# Patient Record
Sex: Female | Born: 1955 | Race: White | Hispanic: No | Smoking: Never smoker
Health system: Southern US, Community
[De-identification: ages and names within clinical notes are randomized; demographics above are authoritative.]

## PROBLEM LIST (undated history)

## (undated) DIAGNOSIS — E119 Type 2 diabetes mellitus without complications: Secondary | ICD-10-CM

## (undated) DIAGNOSIS — K219 Gastro-esophageal reflux disease without esophagitis: Secondary | ICD-10-CM

## (undated) DIAGNOSIS — K200 Eosinophilic esophagitis: Secondary | ICD-10-CM

## (undated) DIAGNOSIS — K222 Esophageal obstruction: Secondary | ICD-10-CM

## (undated) DIAGNOSIS — D869 Sarcoidosis, unspecified: Secondary | ICD-10-CM

## (undated) DIAGNOSIS — J45990 Exercise induced bronchospasm: Secondary | ICD-10-CM

## (undated) DIAGNOSIS — R058 Other specified cough: Secondary | ICD-10-CM

## (undated) DIAGNOSIS — E039 Hypothyroidism, unspecified: Secondary | ICD-10-CM

## (undated) DIAGNOSIS — I10 Essential (primary) hypertension: Secondary | ICD-10-CM

## (undated) DIAGNOSIS — R05 Cough: Secondary | ICD-10-CM

## (undated) DIAGNOSIS — C4491 Basal cell carcinoma of skin, unspecified: Secondary | ICD-10-CM

## (undated) DIAGNOSIS — R0602 Shortness of breath: Secondary | ICD-10-CM

## (undated) DIAGNOSIS — L814 Other melanin hyperpigmentation: Secondary | ICD-10-CM

## (undated) DIAGNOSIS — R079 Chest pain, unspecified: Secondary | ICD-10-CM

## (undated) DIAGNOSIS — E079 Disorder of thyroid, unspecified: Secondary | ICD-10-CM

## (undated) HISTORY — DX: Other melanin hyperpigmentation: L81.4

## (undated) HISTORY — DX: Sarcoidosis, unspecified: D86.9

## (undated) HISTORY — DX: Eosinophilic esophagitis: K20.0

## (undated) HISTORY — PX: ABDOMINAL HYSTERECTOMY: SHX81

## (undated) HISTORY — DX: Essential (primary) hypertension: I10

## (undated) HISTORY — DX: Basal cell carcinoma of skin, unspecified: C44.91

---

## 1997-08-08 ENCOUNTER — Other Ambulatory Visit: Admission: RE | Admit: 1997-08-08 | Discharge: 1997-08-08 | Payer: Self-pay | Admitting: *Deleted

## 1998-06-25 ENCOUNTER — Other Ambulatory Visit: Admission: RE | Admit: 1998-06-25 | Discharge: 1998-06-25 | Payer: Self-pay | Admitting: *Deleted

## 1999-08-26 ENCOUNTER — Other Ambulatory Visit: Admission: RE | Admit: 1999-08-26 | Discharge: 1999-08-26 | Payer: Self-pay | Admitting: Gynecology

## 1999-09-18 ENCOUNTER — Encounter (INDEPENDENT_AMBULATORY_CARE_PROVIDER_SITE_OTHER): Payer: Self-pay

## 1999-09-18 ENCOUNTER — Ambulatory Visit (HOSPITAL_COMMUNITY): Admission: RE | Admit: 1999-09-18 | Discharge: 1999-09-18 | Payer: Self-pay | Admitting: Gynecology

## 2000-06-21 ENCOUNTER — Other Ambulatory Visit: Admission: RE | Admit: 2000-06-21 | Discharge: 2000-06-21 | Payer: Self-pay | Admitting: *Deleted

## 2000-11-11 ENCOUNTER — Ambulatory Visit (HOSPITAL_COMMUNITY): Admission: RE | Admit: 2000-11-11 | Discharge: 2000-11-11 | Payer: Self-pay | Admitting: Family Medicine

## 2000-11-11 ENCOUNTER — Encounter: Payer: Self-pay | Admitting: Family Medicine

## 2001-05-29 ENCOUNTER — Other Ambulatory Visit: Admission: RE | Admit: 2001-05-29 | Discharge: 2001-05-29 | Payer: Self-pay | Admitting: Obstetrics and Gynecology

## 2001-07-12 ENCOUNTER — Encounter (INDEPENDENT_AMBULATORY_CARE_PROVIDER_SITE_OTHER): Payer: Self-pay

## 2001-07-13 ENCOUNTER — Inpatient Hospital Stay (HOSPITAL_COMMUNITY): Admission: RE | Admit: 2001-07-13 | Discharge: 2001-07-14 | Payer: Self-pay | Admitting: Obstetrics and Gynecology

## 2002-10-18 ENCOUNTER — Emergency Department (HOSPITAL_COMMUNITY): Admission: EM | Admit: 2002-10-18 | Discharge: 2002-10-18 | Payer: Self-pay | Admitting: Emergency Medicine

## 2002-10-18 ENCOUNTER — Encounter: Payer: Self-pay | Admitting: Emergency Medicine

## 2005-06-15 ENCOUNTER — Emergency Department (HOSPITAL_COMMUNITY): Admission: EM | Admit: 2005-06-15 | Discharge: 2005-06-16 | Payer: Self-pay | Admitting: *Deleted

## 2005-07-08 ENCOUNTER — Other Ambulatory Visit: Admission: RE | Admit: 2005-07-08 | Discharge: 2005-07-08 | Payer: Self-pay | Admitting: Obstetrics and Gynecology

## 2006-10-12 ENCOUNTER — Emergency Department: Payer: Self-pay | Admitting: Emergency Medicine

## 2007-08-31 ENCOUNTER — Emergency Department (HOSPITAL_COMMUNITY): Admission: EM | Admit: 2007-08-31 | Discharge: 2007-08-31 | Payer: Self-pay | Admitting: Emergency Medicine

## 2007-09-29 ENCOUNTER — Ambulatory Visit (HOSPITAL_COMMUNITY): Admission: RE | Admit: 2007-09-29 | Discharge: 2007-09-29 | Payer: Self-pay | Admitting: Otolaryngology

## 2007-12-25 ENCOUNTER — Emergency Department (HOSPITAL_COMMUNITY): Admission: EM | Admit: 2007-12-25 | Discharge: 2007-12-25 | Payer: Self-pay | Admitting: Emergency Medicine

## 2007-12-28 ENCOUNTER — Ambulatory Visit: Payer: Self-pay | Admitting: Internal Medicine

## 2007-12-28 DIAGNOSIS — R1013 Epigastric pain: Secondary | ICD-10-CM

## 2007-12-28 DIAGNOSIS — K219 Gastro-esophageal reflux disease without esophagitis: Secondary | ICD-10-CM

## 2007-12-28 DIAGNOSIS — E039 Hypothyroidism, unspecified: Secondary | ICD-10-CM

## 2007-12-28 DIAGNOSIS — R519 Headache, unspecified: Secondary | ICD-10-CM | POA: Insufficient documentation

## 2007-12-28 DIAGNOSIS — R51 Headache: Secondary | ICD-10-CM

## 2007-12-28 DIAGNOSIS — K279 Peptic ulcer, site unspecified, unspecified as acute or chronic, without hemorrhage or perforation: Secondary | ICD-10-CM | POA: Insufficient documentation

## 2008-02-21 ENCOUNTER — Ambulatory Visit: Payer: Self-pay | Admitting: Internal Medicine

## 2008-02-21 DIAGNOSIS — M5126 Other intervertebral disc displacement, lumbar region: Secondary | ICD-10-CM | POA: Insufficient documentation

## 2008-02-21 DIAGNOSIS — M545 Low back pain: Secondary | ICD-10-CM | POA: Insufficient documentation

## 2008-02-29 ENCOUNTER — Telehealth: Payer: Self-pay | Admitting: Internal Medicine

## 2008-03-11 DIAGNOSIS — L814 Other melanin hyperpigmentation: Secondary | ICD-10-CM

## 2008-03-11 DIAGNOSIS — C4491 Basal cell carcinoma of skin, unspecified: Secondary | ICD-10-CM

## 2008-03-11 HISTORY — DX: Other melanin hyperpigmentation: L81.4

## 2008-03-11 HISTORY — DX: Basal cell carcinoma of skin, unspecified: C44.91

## 2008-03-18 ENCOUNTER — Ambulatory Visit: Payer: Self-pay | Admitting: Gastroenterology
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
## 2010-09-04 NOTE — Op Note (Signed)
[facility]  Patient:    Katrina [name], Katrina [name] Visit Number: [id number] MRN: [id number]          Service Type: Attending:  Duke Salvia. Marcelle Overlie, M.D. Dictated by:   Duke Salvia. Marcelle Overlie, M.D. Proc. Date: 07/12/01                             Operative Report  PREOPERATIVE DIAGNOSES:       Symptomatic leiomyoma, menorrhagia.  POSTOPERATIVE DIAGNOSES:      Symptomatic leiomyoma, menorrhagia.  PROCEDURE:                    Laparoscopically assisted vaginal hysterectomy.  SURGEON:                      Duke Salvia. Marcelle Overlie, M.D.  ASSISTANTWilley Blade, M.D.  ANESTHESIA:                   General endotracheal.  COMPLICATIONS:                None.  DRAINS:                       Foley catheter.  ESTIMATED BLOOD LOSS:         300.  PROCEDURE AND FINDINGS:       Patient taken to the operating room.  After an adequate level of general endotracheal anesthesia was obtained with the patients legs in stirrups, the abdomen, perineum, and vagina were prepped and draped in the usual manner for laparoscopy.  The bladder was drained.  EUA carried out.  Uterus felt to be 10 weeks size.  Adnexa nonpalpable.  Kahn cannula was positioned.  Attention directed to the abdomen where a 2 cm subumbilical incision was made.  The Veress needle was introduced without difficulty.  Its intra-abdominal position was verified by pressure and water testing.  After a 2 L pneumoperitoneum was then created laparoscopic trocar and sleeve were then introduced without difficulty.  There was no evidence of any bleeding or trauma.  Three fingerbreadths below the symphysis in the midline a second puncture was made and a 5 mm trocar was inserted.  Patient in Trendelenburg.  Pelvic findings as follows:  Uterus itself was symmetrically enlarged 10 weeks size.  Both tubes and ovaries appeared to be normal.  She had requested that these be preserved if normal.  I could not get a good  view of the utero-ovarian pedicles due to the size of the uterus, so decision made to proceed with the vaginal portion of the procedure.  The legs were extended. Weighted speculum was positioned.  Cervix grasped with a tenaculum.  The cervical vaginal mucosa was incised.  Posterior culdotomy performed without difficulty.  The left uterosacral ligament was clamped, divided, and suture ligated with 0 Dexon in Heaney fashion and held.  Same repeated on the opposite side.  The bladder was advanced superiorly with sharp and blunt dissection.  In a sequential manner the cardinal ligament and uterine vasculature pedicles were clamped, divided, and suture ligated with 0 Dexon suture.  There was difficulty in establishing bladder flap plane due to an anterior fibroid.  Decision made to proceed with morcellization.  This was carried out removing fragments of the middle portion of the uterus along  with some fibroids until the left utero-ovarian pedicle could be identified.  This was clamped, divided, first free tied followed by a suture ligature of 0 Dexon.  After this was completed the surgeons finger was used to explore the back part of the uterus until the anterior peritoneum could be identified. This was entered and the remainder of the pedicles were clamped, divided, and suture ligated with 0 Dexon, thus conserving both ovaries.  The posterior cuff was closed with a running locked 2-0 Dexon suture.  Reinspection of all major pedicles at that point revealed them to be hemostatic.  Prior to closure sponge, needle, and instrument counts were reported as correct x2.  The cuff was closed from right to left with figure-of-eight 2-0 Monocryl sutures.  The patient was then relaparoscoped.  The abdomen was reinsufflated.  Irrigation was carried out.  Careful inspection of all operative sites revealed them to be hemostatic.  Instruments were removed.  Gas allowed to escape.  Defects closed with 4-0 Dexon  subcuticular sutures along with Dermabond.  She tolerated this well.  Went to recovery room in good condition. Dictated by:   Duke Salvia. Marcelle Overlie, M.D. Attending:  Duke Salvia. Marcelle Overlie, M.D. DD:  07/12/01 TD:  07/12/01 Job: [id number]

## 2010-09-04 NOTE — H&P (Signed)
[facility]  Patient:    Katrina [name], Katrina [name] Visit Number: [id number] MRN: [id number]          Service Type: Attending:  Duke Salvia. Marcelle Overlie, M.D. Dictated by:   Duke Salvia. Marcelle Overlie, M.D. Adm. Date:  07/12/01                           History and Physical  HISTORY OF PRESENT ILLNESS:   A 55 year old G4 P3; her partner has had a vasectomy.  She has a six to nine-month history of menorrhagia.  She has had an evaluation by her PCP including an ultrasound dated July 2002 that showed several fibroids in the 3-4 cm range with normal adnexa.  She has declined a trial of OCPs due to the heaviness of her flow and prefers to have definitive treatment in the form of hysterectomy.  She does desire to have her ovaries conserved.  This procedure, including risks of bleeding, transfusion, adjacent organ injury, the possible need for open or additional surgery discussed. Endometrial biopsy performed in our office February 2003 was proliferative endometrium with extensive breakdown.  Pap smear February 2003 was Class I.  PAST MEDICAL HISTORY:  ALLERGIES:                    PENICILLIN.  OPERATIONS:                   D&C two years ago.  MEDICATIONS:                  1. Synthroid 0.125 q.d.                               2. Wellbutrin.  REVIEW OF SYSTEMS:            Significant for a history of anemia, recurrent UTIs, seasonal asthma.  She does not take any regular medicines.  PHYSICAL EXAMINATION:  VITAL SIGNS:                  Temperature 98.2, blood pressure 108/60  HEENT:                        Unremarkable.  NECK:                         Supple without masses.  LUNGS:                        Clear.  CARDIOVASCULAR:               Regular rate and rhythm without murmurs, rubs, or gallops noted.  BREASTS:                      Without masses.  ABDOMEN:                      Soft, flat, and nontender.  PELVIC:                       Normal external genitalia,  vagina and cervix clear.  Uterus 8-10 week size, mobile.  Adnexa negative.  IMPRESSION:                   Symptomatic leiomyoma.  PLAN:  LAVH.  Patient desires to have her ovaries conserved if normal.  Procedure and risks reviewed as above. Dictated by:   Duke Salvia. Marcelle Overlie, M.D. Attending:  Duke Salvia. Marcelle Overlie, M.D. DD:  07/05/01 TD:  07/05/01 Job: [id number]

## 2010-09-04 NOTE — Op Note (Signed)
[facility]  Patient:    Katrina [name], Katrina [name]                        MRN: [id number] Proc. Date: 09/18/99 Adm. Date:  [id number] Disc. Date: [id number] Attending:  Douglass Rivers                           Operative Report  PREOPERATIVE DIAGNOSIS:       Dysfunctional uterine bleeding, fibroid uterus.  POSTOPERATIVE DIAGNOSIS:      Dysfunctional uterine bleeding, fibroid uterus.  OPERATION:                    Suction D&C.  SURGEON:                      Douglass Rivers, M.D.  ASSISTANT:  ANESTHESIA:                   IV sedation with paracervical block.  ESTIMATED BLOOD LOSS:         Minimal.  FINDINGS:                     Irregular uterine contours.  PATHOLOGY:                    Uterine contents.  DESCRIPTION OF PROCEDURE:     The patient was taken to the operating room and placed in the dorsal lithotomy position after IV sedation was induced, and prepped and draped in the usual sterile fashion.  A bivalve speculum was placed in the vagina.  The cervix was visualized.  Paracervical block was placed.  The bivalve was removed.  The orientation of the uterus was confirmed.  A sterile weighted speculum was then placed in the vagina.  The cervix as stabilized with a single  tooth tenaculum and gently dilated up to #23 [place].  A #7 suction curet was advanced through the cervix toward the fundus.  The uterus was cleared with two  passes.  A gentle sharp curettage was then performed.  There was a marked irregularity of the right lateral and posterior wall.  The uterus sounded to 10. The instruments were removed from the vagina.  The patient was transferred to the PACU in stable condition. DD:  09/18/99 TD:  09/22/99 Job: [id number]

## 2010-09-04 NOTE — Discharge Summary (Signed)
[facility]  Patient:    Katrina [name], Katrina [name] Visit Number: [id number] MRN: [id number]          Service Type: GYN Location: [id number] Attending Physician:  Rhina Brackett Dictated by:   Duke Salvia. Marcelle Overlie, M.D. Admit Date:  07/12/2001 Discharge Date: 07/14/2001                             Discharge Summary  DISCHARGE DIAGNOSES: 1. Symptomatic leiomyoma with menorrhagia. 2. Laparoscopic assisted vaginal hysterectomy this admission.  HISTORY OF PRESENT ILLNESS:  Please see the admission history and physical for details.  Briefly, a 55 year old, G4, P3, with significant menorrhagia secondary to leiomyoma presents for hysterectomy.  HOSPITAL COURSE:  On 07/12/01, under general anesthesia, the patient underwent laparoscopy followed by a TVH.  A 10 week sized uterus with fibroids were noted.  She requested her ovaries be preserved.  They did appear normal at the time.  On the first postoperative day her hemoglobin was 9.4, her abdomen was unremarkable.  She was increased to clear liquids.  She had some mild nausea which cleared by late afternoon.  By the second postoperative day, again she remained afebrile, was ambulating without difficulty, had established normal bowel and urinary function, remained afebrile, and was ready for discharge.  LABORATORY DATA:  Preoperative hemoglobin 12.6, postoperative on 07/13/01, was 9.4.  Admission urinalysis was negative.  Blood type is O+.  Pathology report is still pending.  DISPOSITION:  The patient is discharged on Tylox p.r.n. pain, Hemocyte once daily.  FOLLOWUP:  Will return to our office in one week.  Advised to report any heavy vaginal bleeding, urinary complaints, fever over 101, persistent nausea, vomiting.  She was given specific instructions regarding diet, sex, and exercise.  CONDITION ON DISCHARGE:  Good.  ACTIVITY:  Gradually increase. Dictated by:   Duke Salvia. Marcelle Overlie, M.D. Attending  Physician:  Rhina Brackett DD:  07/14/01 TD:  07/15/01 Job: [id number]

## 2011-01-20 LAB — COMPREHENSIVE METABOLIC PANEL
ALT: 24
AST: 24
Calcium: 8.9
GFR calc Af Amer: >60
Sodium: 137
Total Protein: 6.5

## 2011-01-20 LAB — DIFFERENTIAL
Eosinophils Absolute: 0.2
Eosinophils Relative: 4
Lymphs Abs: 2
Monocytes Relative: 6

## 2011-01-20 LAB — POCT CARDIAC MARKERS: CKMB, poc: 1.6

## 2011-01-20 LAB — CBC
MCHC: 33.4
RBC: 4.91
RDW: 13.6

## 2011-02-21 ENCOUNTER — Encounter: Payer: Self-pay | Admitting: *Deleted

## 2011-02-21 ENCOUNTER — Emergency Department (HOSPITAL_COMMUNITY)
Admission: EM | Admit: 2011-02-21 | Discharge: 2011-02-22 | Disposition: A | Discharge status: Home / Self Care | Payer: Self-pay | Attending: Emergency Medicine | Admitting: Emergency Medicine

## 2011-02-21 DIAGNOSIS — R110 Nausea: Secondary | ICD-10-CM | POA: Insufficient documentation

## 2011-02-21 DIAGNOSIS — R0602 Shortness of breath: Secondary | ICD-10-CM | POA: Insufficient documentation

## 2011-02-21 DIAGNOSIS — R079 Chest pain, unspecified: Secondary | ICD-10-CM | POA: Insufficient documentation

## 2011-02-21 DIAGNOSIS — R61 Generalized hyperhidrosis: Secondary | ICD-10-CM | POA: Insufficient documentation

## 2011-02-21 DIAGNOSIS — IMO0001 Reserved for inherently not codable concepts without codable children: Secondary | ICD-10-CM | POA: Insufficient documentation

## 2011-02-21 DIAGNOSIS — K219 Gastro-esophageal reflux disease without esophagitis: Secondary | ICD-10-CM | POA: Insufficient documentation

## 2011-02-21 DIAGNOSIS — R1310 Dysphagia, unspecified: Secondary | ICD-10-CM | POA: Insufficient documentation

## 2011-02-21 HISTORY — DX: Disorder of thyroid, unspecified: E07.9

## 2011-02-21 NOTE — ED Notes (Signed)
C/o CP, initially started as heartburn x1 week, has tried prilosec & tums w/o relief. Turned to CP last night after eating. Then went to b/r and vomited, Also mentions becoming diaphoretic. Denies fever sob or diarrhea. Kept waking up from sleep with R arm numb and L arm pain.

## 2011-02-22 ENCOUNTER — Emergency Department (HOSPITAL_COMMUNITY): Payer: Self-pay

## 2011-02-22 LAB — BASIC METABOLIC PANEL
BUN: 12 mg/dL (ref 6–23)
CO2: 27 mEq/L (ref 19–32)
Calcium: 10.4 mg/dL (ref 8.4–10.5)
Chloride: 98 mEq/L (ref 96–112)
Creatinine, Ser: 0.63 mg/dL (ref 0.50–1.10)
GFR calc Af Amer: >90 mL/min (ref >90)
GFR calc non Af Amer: >90 mL/min (ref >90)
Glucose, Bld: 113 mg/dL — ABNORMAL HIGH (ref 70–99)
Potassium: 3.8 mEq/L (ref 3.5–5.1)
Sodium: 136 mEq/L (ref 135–145)

## 2011-02-22 LAB — POCT I-STAT TROPONIN I: Troponin i, poc: 0 ng/mL (ref 0.00–0.08)

## 2011-02-22 LAB — CBC
HCT: 42.7 % (ref 36.0–46.0)
Hemoglobin: 14.7 g/dL (ref 12.0–15.0)
MCH: 28.5 pg (ref 26.0–34.0)
MCHC: 34.4 g/dL (ref 30.0–36.0)
MCV: 82.8 fL (ref 78.0–100.0)
Platelets: 244 10*3/uL (ref 150–400)
RBC: 5.16 MIL/uL — ABNORMAL HIGH (ref 3.87–5.11)
RDW: 13.3 % (ref 11.5–15.5)
WBC: 9.3 10*3/uL (ref 4.0–10.5)

## 2011-02-22 MED ORDER — ASPIRIN 81 MG PO CHEW
CHEWABLE_TABLET | ORAL | Status: AC
  Filled 2011-02-22: qty 4

## 2011-02-22 MED ORDER — PANTOPRAZOLE SODIUM 20 MG PO TBEC: 40.0000 mg | DELAYED_RELEASE_TABLET | Freq: Every day | ORAL | Status: DC

## 2011-02-22 MED ORDER — SUCRALFATE 1 G PO TABS: 1.0000 g | ORAL_TABLET | Freq: Four times a day (QID) | ORAL | Status: DC

## 2011-02-22 MED ORDER — ASPIRIN 325 MG PO TABS: 325.0000 mg | ORAL_TABLET | ORAL | Status: DC

## 2011-02-22 MED ORDER — NITROGLYCERIN 0.4 MG SL SUBL
0.4000 mg | SUBLINGUAL_TABLET | SUBLINGUAL | Status: DC | PRN
  Filled 2011-02-22: qty 25

## 2011-02-22 MED ORDER — GI COCKTAIL ~~LOC~~
30.0000 mL | Freq: Once | ORAL | Status: AC
  Administered 2011-02-22: 30 mL via ORAL
  Filled 2011-02-22: qty 30

## 2011-02-22 MED ORDER — ASPIRIN 81 MG PO CHEW
324.0000 mg | CHEWABLE_TABLET | Freq: Once | ORAL | Status: AC
  Administered 2011-02-22: 324 mg via ORAL

## 2011-02-22 NOTE — ED Provider Notes (Signed)
History     CSN: [id number] Arrival date & time: 02/21/2011 11:36 PM   First MD Initiated Contact with Patient 02/22/11 0043      Chief Complaint  Patient presents with  . Chest Pain     HPI 55 yo female presents to the ER with complaint of burning in chest and food getting hung up when trying to swallow.  Symptoms have been ongoing for some time, but worse over the past week.  Pt reports she has been taking prilosec without improvement.  Dysphagia mainly with meat.  On Saturday went to bbq, had chopped bbq sandwich when she had the sensation of food getting hung up.  She went to the bathroom and developed chest pain, nausea, shortness of breath, and some sweating.  After a few minutes she had the sensation of passing of the bolus and began feeling better.  Pt has not prior evaluation for this.  Pt presents 24 hours later as she has been having some myalgias in her arms and became concerned about heart problems.  Pt does not have h/o cad, nonsmoker, no other risk factors other than strong family history of father, brothers with cad.  No prior cardiac evalution.  Pt denies any current symptoms Past Medical History  Diagnosis Date  . Thyroid disease     Past Surgical History  Procedure Date  . Abdominal hysterectomy     Family History  Problem Relation Age of Onset  . Diabetes Mother   . Stroke Mother   . Coronary artery disease Father   . Coronary artery disease Brother     History  Substance Use Topics  . Smoking status: Never Smoker   . Smokeless tobacco: Not on file  . Alcohol Use: No    OB History    Grav Para Term Preterm Abortions TAB SAB Ect Mult Living                  Review of Systems  Constitutional: Negative.   HENT: Negative.   Eyes: Negative.   Respiratory: Negative.   Gastrointestinal: Positive for nausea. Negative for vomiting, diarrhea, constipation, blood in stool and abdominal distention.  Genitourinary: Negative.   Musculoskeletal: Negative.     Skin: Negative.   Neurological: Negative.   Hematological: Negative.   Psychiatric/Behavioral: Negative.   All other systems reviewed and are negative.    Allergies  Penicillins  Home Medications   Current Outpatient Rx  Name Route Sig Dispense Refill  . LEVOTHYROXINE SODIUM 125 MCG PO TABS Oral Take 125 mcg by mouth daily.      Marland Kitchen PANTOPRAZOLE SODIUM 20 MG PO TBEC Oral Take 2 tablets (40 mg total) by mouth daily. 30 tablet 0  . SUCRALFATE 1 G PO TABS Oral Take 1 tablet (1 g total) by mouth 4 (four) times daily. 40 tablet 0    BP 101/50  Pulse 69  Temp(Src) 98.3 F (36.8 C) (Oral)  Resp 15  SpO2 99%  Physical Exam  Constitutional: She is oriented to person, place, and time. She appears well-developed and well-nourished.  HENT:  Head: Normocephalic and atraumatic.  Right Ear: External ear normal.  Left Ear: External ear normal.  Nose: Nose normal.  Mouth/Throat: Oropharynx is clear and moist.  Eyes: Conjunctivae and EOM are normal. Pupils are equal, round, and reactive to light.  Neck: Normal range of motion. Neck supple. No JVD present. No tracheal deviation present. No thyromegaly present.  Cardiovascular: Normal rate, regular rhythm, normal heart sounds and intact  distal pulses.  Exam reveals no gallop and no friction rub.   No murmur heard. Pulmonary/Chest: Effort normal and breath sounds normal. No stridor. No respiratory distress. She has no wheezes. She has no rales. She exhibits no tenderness.  Abdominal: Soft. Bowel sounds are normal. She exhibits no distension and no mass. There is no tenderness. There is no rebound and no guarding.  Musculoskeletal: Normal range of motion. She exhibits no edema and no tenderness.  Lymphadenopathy:    She has no cervical adenopathy.  Neurological: She is oriented to person, place, and time.  Skin: Skin is dry. No rash noted. No erythema. No pallor.  Psychiatric: She has a normal mood and affect. Her behavior is normal. Judgment  and thought content normal.    ED Course  Procedures (including critical care time)  Labs Reviewed  CBC - Abnormal; Notable for the following:    RBC 5.16 (*)    All other components within normal limits  BASIC METABOLIC PANEL - Abnormal; Notable for the following:    Glucose, Bld 113 (*)    All other components within normal limits  POCT I-STAT TROPONIN I   Dg Chest 2 View  02/22/2011  *RADIOLOGY REPORT*  Clinical Data: Chest pain  CHEST - 2 VIEW  Comparison: 12/25/2007  Findings: Lungs are clear. No pleural effusion or pneumothorax. The cardiomediastinal contours are within normal limits. The visualized bones and soft tissues are without significant appreciable abnormality.  IMPRESSION: No acute process identified.  Original Report Authenticated By: Waneta Martins, M.D.     1. GERD   2. Chest pain     Date: 11/042012  Rate: 86  Rhythm: normal sinus rhythm  QRS Axis: normal  Intervals: normal  ST/T Wave abnormalities: normal  Conduction Disutrbances:none  Narrative Interpretation:   Old EKG Reviewed: none available   MDM  55 yo female with chest pain, dysphagia, gerd sxs.  Pt has family history of cad, otherwise no other risk factors.  Discussed with patient need for further risk stratification for chest pain and GI evaluation.  Pt refused admission or observation with am stress test.  Pt given referral numbers for [facility]/cardiology and advised to return for worsening condition        Olivia Mackie, MD 02/22/11 [phone]

## 2011-02-22 NOTE — ED Notes (Signed)
PT denies any pain at this time.  States pain increased when she drank water, but decreased after taking GI cocktail.  Dr Norlene Campbell stated to hold nitro SL d/t pt lack of pain at this time.

## 2011-02-22 NOTE — ED Notes (Signed)
Pt c/o epigastric burning for several weeks.  Pain increased yesterday after eating barbeque.  Denies sob or pain at this time.  Pt states bil shoulder pain as well and describes it as joint pain.  Pain is not reproducible.

## 2011-02-22 NOTE — ED Notes (Signed)
D/C rcvd from Dr Norlene Campbell.

## 2011-08-11 ENCOUNTER — Ambulatory Visit (INDEPENDENT_AMBULATORY_CARE_PROVIDER_SITE_OTHER): Payer: Self-pay | Admitting: Family Medicine

## 2011-08-11 VITALS — BP 133/81 | HR 73 | Temp 98.3°F | Resp 18 | Ht 63.5 in | Wt 163.0 lb

## 2011-08-11 DIAGNOSIS — M719 Bursopathy, unspecified: Secondary | ICD-10-CM

## 2011-08-11 DIAGNOSIS — R002 Palpitations: Secondary | ICD-10-CM

## 2011-08-11 DIAGNOSIS — M7581 Other shoulder lesions, right shoulder: Secondary | ICD-10-CM

## 2011-08-11 DIAGNOSIS — E039 Hypothyroidism, unspecified: Secondary | ICD-10-CM

## 2011-08-11 DIAGNOSIS — K219 Gastro-esophageal reflux disease without esophagitis: Secondary | ICD-10-CM

## 2011-08-11 DIAGNOSIS — R51 Headache: Secondary | ICD-10-CM

## 2011-08-11 LAB — TSH: TSH: 14.706 u[IU]/mL — ABNORMAL HIGH (ref 0.350–4.500)

## 2011-08-11 LAB — POCT CBC
Granulocyte percent: 69.4 %G (ref 37–80)
MID (cbc): 0.4 (ref 0–0.9)
MPV: 9.2 fL (ref 0–99.8)
POC Granulocyte: 5.9 (ref 2–6.9)
POC LYMPH PERCENT: 25.5 %L (ref 10–50)
POC MID %: 5.1 %M (ref 0–12)
Platelet Count, POC: 274 10*3/uL (ref 142–424)
RBC: 5.26 M/uL (ref 4.04–5.48)
RDW, POC: 15 %

## 2011-08-11 NOTE — Progress Notes (Signed)
  Subjective:    Patient ID: Katrina [name], female    DOB: [birth year], 56 y.o.   MRN: [id number]  HPI  Patient presents requesting refill of her Synthroid  Wakes up in the middle of the night with palpitations for the last 10 days.   Can wake patient up several times a night. Walking relieves palpitations.  Denies  CP, SOB or presyncopal symptoms. Has occurred in the past when her TSH was over suppressed  Cortisone injection (R) shoulder secondary to rotator cuff tendinitis([facility]) one months ago. and patient concerned palpitations are related to the injection 1 months ago  H/O anemia; s/p TAH 2002  Anxiety more recently  "Sick" headache- always on top of head; walking helps symptoms.H/O migraine headaches No nausea ,emesis Photo or phonophobia. No focal deficits Review of Systems     Objective:   Physical Exam  Constitutional: She appears well-developed.  HENT:  Head: Normocephalic and atraumatic.  Right Ear: External ear normal.  Left Ear: External ear normal.  Nose: Nose normal.  Mouth/Throat: Oropharynx is clear and moist.  Eyes: EOM are normal. Pupils are equal, round, and reactive to light.  Neck: Normal range of motion. Neck supple. No thyromegaly present.  Cardiovascular: Normal rate, regular rhythm and normal heart sounds.   Pulmonary/Chest: Effort normal and breath sounds normal.  Abdominal: Soft. Bowel sounds are normal. There is no hepatosplenomegaly.  Neurological: She has normal strength. No cranial nerve deficit or sensory deficit.  Reflex Scores:      Bicep reflexes are 2+ on the right side and 2+ on the left side.      Patellar reflexes are 2+ on the right side and 2+ on the left side. Skin: Skin is warm. No rash noted.  Psychiatric:       Appears stressed   CBC=Nl EKG= NSR      Assessment & Plan:   1. Palpitations  POCT CBC, TSH, EKG 12-Lead  2. Hypothyroid  Check TSH; adjust if necessary after lab back  3. Headache  Monitor; normal  neurologic exam  4. Tendinitis of right rotator cuff  Per orthopedics  5. GERD (gastroesophageal reflux disease)     See medications on AVS Anticipatory guidance

## 2011-08-17 ENCOUNTER — Telehealth: Payer: Self-pay

## 2011-08-17 NOTE — Telephone Encounter (Signed)
Pt called saying that she was returning a call. Pts labs are back and explained to pt that they are back but Dr Hal Hope has not reviewed them yet. Told her that her TSH was high and her medication would probably need to be increased but do not have the new Rx yet. Pt asked if another provider could review and send in new Rx, if possible, bc she is completely out and would like to get Rx tonight.

## 2011-08-18 ENCOUNTER — Other Ambulatory Visit: Payer: Self-pay | Admitting: Family Medicine

## 2011-08-18 DIAGNOSIS — E039 Hypothyroidism, unspecified: Secondary | ICD-10-CM

## 2011-08-18 MED ORDER — LEVOTHYROXINE SODIUM 150 MCG PO TABS: 150.0000 ug | ORAL_TABLET | Freq: Every day | ORAL | Status: DC

## 2011-08-18 NOTE — Telephone Encounter (Signed)
Spoke with patient and reviewed lab work with her. Patient symptomatically feels better that she had at her OV. Synthroid 150 mcg daily e prescribed.  Patient to follow up in 8-12 weeks for repeat TSH.

## 2011-08-18 NOTE — Telephone Encounter (Signed)
For Dr. Wendee Copp review

## 2012-05-05 ENCOUNTER — Emergency Department (HOSPITAL_BASED_OUTPATIENT_CLINIC_OR_DEPARTMENT_OTHER)
Admission: EM | Admit: 2012-05-05 | Discharge: 2012-05-05 | Disposition: A | Discharge status: Home / Self Care | Payer: Self-pay | Attending: Emergency Medicine | Admitting: Emergency Medicine

## 2012-05-05 ENCOUNTER — Encounter (HOSPITAL_BASED_OUTPATIENT_CLINIC_OR_DEPARTMENT_OTHER): Payer: Self-pay | Admitting: Emergency Medicine

## 2012-05-05 DIAGNOSIS — R1013 Epigastric pain: Secondary | ICD-10-CM | POA: Insufficient documentation

## 2012-05-05 DIAGNOSIS — R300 Dysuria: Secondary | ICD-10-CM | POA: Insufficient documentation

## 2012-05-05 DIAGNOSIS — R112 Nausea with vomiting, unspecified: Secondary | ICD-10-CM | POA: Insufficient documentation

## 2012-05-05 DIAGNOSIS — E079 Disorder of thyroid, unspecified: Secondary | ICD-10-CM | POA: Insufficient documentation

## 2012-05-05 DIAGNOSIS — Z79899 Other long term (current) drug therapy: Secondary | ICD-10-CM | POA: Insufficient documentation

## 2012-05-05 LAB — COMPREHENSIVE METABOLIC PANEL
ALT: 36 U/L — ABNORMAL HIGH (ref 0–35)
AST: 31 U/L (ref 0–37)
Alkaline Phosphatase: 96 U/L (ref 39–117)
CO2: 25 mEq/L (ref 19–32)
Calcium: 8.7 mg/dL (ref 8.4–10.5)
GFR calc Af Amer: >90 mL/min (ref >90)
GFR calc non Af Amer: >90 mL/min (ref >90)
Glucose, Bld: 96 mg/dL (ref 70–99)
Potassium: 3.8 mEq/L (ref 3.5–5.1)
Sodium: 141 mEq/L (ref 135–145)
Total Protein: 7.2 g/dL (ref 6.0–8.3)

## 2012-05-05 LAB — URINALYSIS, ROUTINE W REFLEX MICROSCOPIC
Bilirubin Urine: NEGATIVE
Glucose, UA: NEGATIVE mg/dL
Specific Gravity, Urine: 1.026 (ref 1.005–1.030)

## 2012-05-05 LAB — CBC WITH DIFFERENTIAL/PLATELET
Basophils Absolute: 0 10*3/uL (ref 0.0–0.1)
Eosinophils Relative: 2 % (ref 0–5)
Lymphocytes Relative: 13 % (ref 12–46)
Lymphs Abs: 0.9 10*3/uL (ref 0.7–4.0)
Neutrophils Relative %: 78 % — ABNORMAL HIGH (ref 43–77)
Platelets: 218 10*3/uL (ref 150–400)
RBC: 5.14 MIL/uL — ABNORMAL HIGH (ref 3.87–5.11)
RDW: 13.9 % (ref 11.5–15.5)
WBC: 7.4 10*3/uL (ref 4.0–10.5)

## 2012-05-05 LAB — URINE MICROSCOPIC-ADD ON

## 2012-05-05 MED ORDER — FAMOTIDINE IN NACL 20-0.9 MG/50ML-% IV SOLN
20.0000 mg | Freq: Once | INTRAVENOUS | Status: AC
  Administered 2012-05-05: 20 mg via INTRAVENOUS
  Filled 2012-05-05: qty 50

## 2012-05-05 MED ORDER — ONDANSETRON HCL 4 MG/2ML IJ SOLN
4.0000 mg | Freq: Once | INTRAMUSCULAR | Status: AC
Start: 2012-05-05 — End: 2012-05-05
  Administered 2012-05-05: 4 mg via INTRAVENOUS
  Filled 2012-05-05: qty 2

## 2012-05-05 MED ORDER — SUCRALFATE 1 G PO TABS: 1.0000 g | ORAL_TABLET | Freq: Four times a day (QID) | ORAL | Status: DC

## 2012-05-05 MED ORDER — OMEPRAZOLE 20 MG PO CPDR: 20.0000 mg | DELAYED_RELEASE_CAPSULE | Freq: Every day | ORAL | Status: DC

## 2012-05-05 MED ORDER — FAMOTIDINE 20 MG PO TABS: 20.0000 mg | ORAL_TABLET | Freq: Two times a day (BID) | ORAL | Status: DC

## 2012-05-05 MED ORDER — SODIUM CHLORIDE 0.9 % IV BOLUS (SEPSIS)
1000.0000 mL | Freq: Once | INTRAVENOUS | Status: AC
  Administered 2012-05-05: 1000 mL via INTRAVENOUS

## 2012-05-05 MED ORDER — ACETAMINOPHEN 325 MG PO TABS
650.0000 mg | ORAL_TABLET | Freq: Once | ORAL | Status: AC
  Administered 2012-05-05: 650 mg via ORAL
  Filled 2012-05-05: qty 2

## 2012-05-05 NOTE — ED Notes (Signed)
Patient reports that she had upper abdominal pain that seemed to be improving by taking prilosec. Then patient began flu symptoms on 05/03/12 and began taking Tylenol due to body aches. Now stomach pain is much worse and radiates to back, associated with poor po intake per patient. Pain is worse after eating and wasn't relieved by Pepto. Reports "I had a little burning with urination yesterday." "I had nausea/vomiting/diarrhea but that has passed."

## 2012-05-05 NOTE — ED Provider Notes (Signed)
History     CSN: [id number]  Arrival date & time 05/05/12  1739   First MD Initiated Contact with Patient 05/05/12 1811      Chief Complaint  Patient presents with  . Abdominal Pain    (Consider location/radiation/quality/duration/timing/severity/associated sxs/prior treatment) HPI Comments: Patient with history of hysterectomy presents with complaint of 2 weeks epigastric pain made worse by food. Patient states that the pain radiates to her back at times. Patient had been taking Prilosec which she thought was helping. It has been worse over the past several days. Patient states that she developed a flulike symptoms including body aches, vomiting, diarrhea however these are improved. Patient states that she continues to vomit and have nausea with eating. No fevers chest pain, shortness of breath, urinary symptoms. She notes some dysuria. Onset acute. Course is persistent.   Patient is a 57 y.o. female presenting with abdominal pain. The history is provided by the patient.  Abdominal Pain The primary symptoms of the illness include abdominal pain, nausea, vomiting and dysuria. The primary symptoms of the illness do not include fever or diarrhea.  The dysuria is not associated with frequency.  Symptoms associated with the illness do not include frequency.    Past Medical History  Diagnosis Date  . Thyroid disease     Past Surgical History  Procedure Date  . Abdominal hysterectomy     Family History  Problem Relation Age of Onset  . Diabetes Mother   . Stroke Mother   . Coronary artery disease Father   . Coronary artery disease Brother     History  Substance Use Topics  . Smoking status: Never Smoker   . Smokeless tobacco: Not on file  . Alcohol Use: No    OB History    Grav Para Term Preterm Abortions TAB SAB Ect Mult Living                  Review of Systems  Constitutional: Negative for fever.  HENT: Negative for sore throat and rhinorrhea.   Eyes: Negative  for redness.  Respiratory: Negative for cough.   Cardiovascular: Negative for chest pain.  Gastrointestinal: Positive for nausea, vomiting and abdominal pain. Negative for diarrhea.  Genitourinary: Positive for dysuria. Negative for frequency.  Musculoskeletal: Negative for myalgias.  Skin: Negative for rash.  Neurological: Negative for headaches.    Allergies  Penicillins  Home Medications   Current Outpatient Rx  Name  Route  Sig  Dispense  Refill  . LEVOTHYROXINE SODIUM 150 MCG PO TABS   Oral   Take 1 tablet (150 mcg total) by mouth daily.   90 tablet   3   . PANTOPRAZOLE SODIUM 20 MG PO TBEC   Oral   Take 2 tablets (40 mg total) by mouth daily.   30 tablet   0   . SUCRALFATE 1 G PO TABS   Oral   Take 1 tablet (1 g total) by mouth 4 (four) times daily.   40 tablet   0     BP 125/64  Pulse 77  Temp 97.6 F (36.4 C) (Oral)  Resp 16  Ht 5\' 2"  (1.575 m)  Wt 167 lb 11.2 oz (76.068 kg)  BMI 30.67 kg/m2  SpO2 95%  Physical Exam  Nursing note and vitals reviewed. Constitutional: She appears well-developed and well-nourished.  HENT:  Head: Normocephalic and atraumatic.  Eyes: Conjunctivae normal are normal. Right eye exhibits no discharge. Left eye exhibits no discharge.  Neck: Normal range  of motion. Neck supple.  Cardiovascular: Normal rate, regular rhythm and normal heart sounds.   Pulmonary/Chest: Effort normal and breath sounds normal.  Abdominal: Soft. There is tenderness in the right upper quadrant, epigastric area and left upper quadrant. There is no rigidity, no rebound, no guarding, no CVA tenderness, no tenderness at McBurney's point and negative Murphy's sign.  Neurological: She is alert.  Skin: Skin is warm and dry.  Psychiatric: She has a normal mood and affect.    ED Course  Procedures (including critical care time)  Labs Reviewed  CBC WITH DIFFERENTIAL - Abnormal; Notable for the following:    RBC 5.14 (*)     Neutrophils Relative 78 (*)      All other components within normal limits  COMPREHENSIVE METABOLIC PANEL - Abnormal; Notable for the following:    ALT 36 (*)     All other components within normal limits  URINALYSIS, ROUTINE W REFLEX MICROSCOPIC - Abnormal; Notable for the following:    APPearance CLOUDY (*)     Hgb urine dipstick SMALL (*)     All other components within normal limits  URINE MICROSCOPIC-ADD ON - Abnormal; Notable for the following:    Squamous Epithelial / LPF FEW (*)     Bacteria, UA FEW (*)     All other components within normal limits  LIPASE, BLOOD  URINE CULTURE   No results found.   1. Epigastric pain     6:27 PM Patient seen and examined. Work-up initiated. Medications ordered.   Vital signs reviewed and are as follows: Filed Vitals:   05/05/12 1756  BP: 125/64  Pulse: 77  Temp: 97.6 F (36.4 C)  Resp: 16   Labs reassuring. Results d/w patient and Dr. Blinda Leatherwood.   Patient is feeling better. She is tolerating liquids in the room without vomiting. Pain improved.  Will discharged home on PPI, H2 blocker and Carafate. She is urged to followup with gastroenterology referral.  8:36 PM The patient was urged to return to the Emergency Department immediately with worsening of current symptoms, worsening abdominal pain, persistent vomiting, blood noted in stools, fever, or any other concerns. The patient verbalized understanding.     MDM  Patient with epigastric pain most consistent with peptic ulcer disease, less likely but possible cholelithiasis. Do not suspect cholecystitis given exam and reassuring labs. Patient improved in emergency department with H2 blocker and Zofran. Abdomen is soft. Patient is tolerating fluids and she appears well. Will treat symptomatically and have patient follow up with GI, PCP. Strict return instructions given.      Renne Crigler, [place] 05/05/12 2039

## 2012-05-05 NOTE — ED Provider Notes (Signed)
Medical screening examination/treatment/procedure(s) were performed by non-physician practitioner and as supervising physician I was immediately available for consultation/collaboration.  Gilda Crease, MD 05/05/12 [phone]

## 2012-05-07 LAB — URINE CULTURE

## 2012-07-18 ENCOUNTER — Other Ambulatory Visit: Payer: Self-pay | Admitting: Dermatology

## 2012-11-07 DIAGNOSIS — R079 Chest pain, unspecified: Secondary | ICD-10-CM

## 2012-11-07 HISTORY — DX: Chest pain, unspecified: R07.9

## 2012-11-08 ENCOUNTER — Emergency Department (HOSPITAL_COMMUNITY): Payer: Self-pay

## 2012-11-08 ENCOUNTER — Observation Stay (HOSPITAL_COMMUNITY)
Admission: EM | Admit: 2012-11-08 | Discharge: 2012-11-10 | Disposition: A | Discharge status: Home / Self Care | Payer: MEDICAID | Attending: Internal Medicine | Admitting: Internal Medicine

## 2012-11-08 ENCOUNTER — Encounter (HOSPITAL_COMMUNITY): Payer: Self-pay | Admitting: Emergency Medicine

## 2012-11-08 DIAGNOSIS — R1013 Epigastric pain: Secondary | ICD-10-CM

## 2012-11-08 DIAGNOSIS — R072 Precordial pain: Secondary | ICD-10-CM

## 2012-11-08 DIAGNOSIS — R079 Chest pain, unspecified: Principal | ICD-10-CM

## 2012-11-08 DIAGNOSIS — R51 Headache: Secondary | ICD-10-CM

## 2012-11-08 DIAGNOSIS — K279 Peptic ulcer, site unspecified, unspecified as acute or chronic, without hemorrhage or perforation: Secondary | ICD-10-CM

## 2012-11-08 DIAGNOSIS — R1310 Dysphagia, unspecified: Secondary | ICD-10-CM

## 2012-11-08 DIAGNOSIS — M5126 Other intervertebral disc displacement, lumbar region: Secondary | ICD-10-CM

## 2012-11-08 DIAGNOSIS — E039 Hypothyroidism, unspecified: Secondary | ICD-10-CM

## 2012-11-08 DIAGNOSIS — K222 Esophageal obstruction: Secondary | ICD-10-CM

## 2012-11-08 DIAGNOSIS — R0602 Shortness of breath: Secondary | ICD-10-CM

## 2012-11-08 DIAGNOSIS — Z8711 Personal history of peptic ulcer disease: Secondary | ICD-10-CM | POA: Insufficient documentation

## 2012-11-08 DIAGNOSIS — K219 Gastro-esophageal reflux disease without esophagitis: Secondary | ICD-10-CM

## 2012-11-08 DIAGNOSIS — M549 Dorsalgia, unspecified: Secondary | ICD-10-CM

## 2012-11-08 HISTORY — DX: Hypothyroidism, unspecified: E03.9

## 2012-11-08 HISTORY — DX: Gastro-esophageal reflux disease without esophagitis: K21.9

## 2012-11-08 HISTORY — DX: Shortness of breath: R06.02

## 2012-11-08 HISTORY — DX: Chest pain, unspecified: R07.9

## 2012-11-08 LAB — POCT I-STAT, CHEM 8
BUN: 12 mg/dL (ref 6–23)
Chloride: 104 mEq/L (ref 96–112)
Creatinine, Ser: 0.8 mg/dL (ref 0.50–1.10)
Glucose, Bld: 128 mg/dL — ABNORMAL HIGH (ref 70–99)
Hemoglobin: 14.3 g/dL (ref 12.0–15.0)
Potassium: 3.7 mEq/L (ref 3.5–5.1)
Sodium: 141 mEq/L (ref 135–145)

## 2012-11-08 LAB — CBC
HCT: 42.4 % (ref 36.0–46.0)
Hemoglobin: 14.4 g/dL (ref 12.0–15.0)
MCH: 29.1 pg (ref 26.0–34.0)
MCHC: 34 g/dL (ref 30.0–36.0)
MCV: 85.8 fL (ref 78.0–100.0)

## 2012-11-08 LAB — CREATININE, SERUM: GFR calc non Af Amer: >90 mL/min (ref >90)

## 2012-11-08 LAB — HEPATIC FUNCTION PANEL
AST: 18 U/L (ref 0–37)
Albumin: 3.9 g/dL (ref 3.5–5.2)
Total Bilirubin: 0.4 mg/dL (ref 0.3–1.2)

## 2012-11-08 LAB — TROPONIN I: Troponin I: <0.3 ng/mL (ref <0.30)

## 2012-11-08 LAB — POCT I-STAT TROPONIN I

## 2012-11-08 LAB — PRO B NATRIURETIC PEPTIDE: Pro B Natriuretic peptide (BNP): 31.4 pg/mL (ref 0–125)

## 2012-11-08 LAB — D-DIMER, QUANTITATIVE: D-Dimer, Quant: <0.27 ug/mL-FEU (ref 0.00–0.48)

## 2012-11-08 MED ORDER — ASPIRIN 81 MG PO CHEW
324.0000 mg | CHEWABLE_TABLET | Freq: Once | ORAL | Status: AC
  Administered 2012-11-08: 324 mg via ORAL
  Filled 2012-11-08: qty 4

## 2012-11-08 MED ORDER — SODIUM CHLORIDE 0.9 % IJ SOLN: 3.0000 mL | Freq: Two times a day (BID) | INTRAMUSCULAR | Status: DC

## 2012-11-08 MED ORDER — ACETAMINOPHEN 650 MG RE SUPP: 650.0000 mg | Freq: Four times a day (QID) | RECTAL | Status: DC | PRN

## 2012-11-08 MED ORDER — SODIUM CHLORIDE 0.9 % IJ SOLN
3.0000 mL | Freq: Two times a day (BID) | INTRAMUSCULAR | Status: DC
  Administered 2012-11-08 – 2012-11-09 (×3): 3 mL via INTRAVENOUS

## 2012-11-08 MED ORDER — ASPIRIN EC 325 MG PO TBEC
325.0000 mg | DELAYED_RELEASE_TABLET | Freq: Every day | ORAL | Status: DC
  Administered 2012-11-08 – 2012-11-09 (×2): 325 mg via ORAL
  Filled 2012-11-08 (×3): qty 1

## 2012-11-08 MED ORDER — SODIUM CHLORIDE 0.9 % IJ SOLN: 3.0000 mL | INTRAMUSCULAR | Status: DC | PRN

## 2012-11-08 MED ORDER — SODIUM CHLORIDE 0.9 % IV SOLN: 250.0000 mL | INTRAVENOUS | Status: DC | PRN

## 2012-11-08 MED ORDER — LEVOTHYROXINE SODIUM 125 MCG PO TABS
125.0000 ug | ORAL_TABLET | Freq: Every day | ORAL | Status: DC
  Administered 2012-11-08 – 2012-11-10 (×3): 125 ug via ORAL
  Filled 2012-11-08 (×4): qty 1

## 2012-11-08 MED ORDER — TRAMADOL HCL 50 MG PO TABS
50.0000 mg | ORAL_TABLET | Freq: Four times a day (QID) | ORAL | Status: DC | PRN
  Administered 2012-11-08 – 2012-11-09 (×2): 50 mg via ORAL
  Filled 2012-11-08 (×2): qty 1

## 2012-11-08 MED ORDER — GI COCKTAIL ~~LOC~~
30.0000 mL | Freq: Once | ORAL | Status: AC
  Administered 2012-11-08: 30 mL via ORAL
  Filled 2012-11-08: qty 30

## 2012-11-08 MED ORDER — ACETAMINOPHEN 325 MG PO TABS: 650.0000 mg | ORAL_TABLET | Freq: Four times a day (QID) | ORAL | Status: DC | PRN

## 2012-11-08 MED ORDER — PANTOPRAZOLE SODIUM 40 MG PO TBEC
40.0000 mg | DELAYED_RELEASE_TABLET | Freq: Two times a day (BID) | ORAL | Status: DC
  Administered 2012-11-08 – 2012-11-09 (×3): 40 mg via ORAL
  Filled 2012-11-08 (×3): qty 1

## 2012-11-08 MED ORDER — ONDANSETRON HCL 4 MG PO TABS: 4.0000 mg | ORAL_TABLET | Freq: Four times a day (QID) | ORAL | Status: DC | PRN

## 2012-11-08 MED ORDER — FAMOTIDINE 20 MG PO TABS
20.0000 mg | ORAL_TABLET | Freq: Two times a day (BID) | ORAL | Status: DC | PRN
  Filled 2012-11-08 (×2): qty 1

## 2012-11-08 MED ORDER — ONDANSETRON HCL 4 MG/2ML IJ SOLN: 4.0000 mg | Freq: Four times a day (QID) | INTRAMUSCULAR | Status: DC | PRN

## 2012-11-08 MED ORDER — ENOXAPARIN SODIUM 40 MG/0.4ML ~~LOC~~ SOLN
40.0000 mg | SUBCUTANEOUS | Status: DC
  Administered 2012-11-08 – 2012-11-09 (×2): 40 mg via SUBCUTANEOUS
  Filled 2012-11-08 (×3): qty 0.4

## 2012-11-08 NOTE — ED Provider Notes (Signed)
History    CSN: [id number] Arrival date & time 11/08/12  0246  None    Chief Complaint  Patient presents with  . Chest Pain   HPI Katrina [name] is a 57 y.o. female presenting with chest pain and shortness of breath. Patient awoke with the symptoms, her pain is moderate, for it 7/10, described as pressure in the center of her chest, is nonradiating, associated with shortness of breath, no diaphoresis, nausea or vomiting. Patient is had similar symptoms in the past. She recently started doing water aerobics because she's had back issues, she's noticed she's been short of breath when she's been working out. She's tried an albuterol inhaler she has not had any improvement. Approximately a week ago, she's noticed similar symptoms while working out she thought it was reflux and started taking Pepcid this also did not help. Patient has no known history of hypertension, hyperlipidemia or diabetes. She has never smoked cigarettes, denies use drugs. Father and 2 brothers have all had MIs at age 83. Mother has history of heart disease and CVA   Past Medical History  Diagnosis Date  . Thyroid disease    Past Surgical History  Procedure Laterality Date  . Abdominal hysterectomy     Family History  Problem Relation Age of Onset  . Diabetes Mother   . Stroke Mother   . Coronary artery disease Father   . Coronary artery disease Brother    History  Substance Use Topics  . Smoking status: Never Smoker   . Smokeless tobacco: Not on file  . Alcohol Use: No   OB History   Grav Para Term Preterm Abortions TAB SAB Ect Mult Living                 Review of Systems At least 10pt or greater review of systems completed and are negative except where specified in the HPI.  Allergies  Penicillins  Home Medications   Current Outpatient Rx  Name  Route  Sig  Dispense  Refill  . famotidine (PEPCID) 20 MG tablet   Oral   Take 1 tablet (20 mg total) by mouth 2 (two) times daily.   30 tablet   0   . omeprazole (PRILOSEC) 20 MG capsule   Oral   Take 1 capsule (20 mg total) by mouth daily. Take one cap PO twice a day for 3 days, then one cap PO once a day   30 capsule   0   . EXPIRED: pantoprazole (PROTONIX) 20 MG tablet   Oral   Take 2 tablets (40 mg total) by mouth daily.   30 tablet   0   . EXPIRED: sucralfate (CARAFATE) 1 G tablet   Oral   Take 1 tablet (1 g total) by mouth 4 (four) times daily.   40 tablet   0   . sucralfate (CARAFATE) 1 G tablet   Oral   Take 1 tablet (1 g total) by mouth 4 (four) times daily. Take before meals and at bedtime   30 tablet   0    BP 146/80  Temp(Src) 97.7 F (36.5 C) (Oral)  Resp 17  SpO2 96% Physical Exam  Nursing notes reviewed.  Electronic medical record reviewed. VITAL SIGNS:   Filed Vitals:   11/08/12 0251  BP: 146/80  Temp: 97.7 F (36.5 C)  TempSrc: Oral  Resp: 17  SpO2: 96%   CONSTITUTIONAL: Awake, oriented, appears non-toxic HENT: Atraumatic, normocephalic, oral mucosa pink and moist, airway patent.  Nares patent without drainage. External ears normal. EYES: Conjunctiva clear, EOMI, PERRLA NECK: Trachea midline, non-tender, supple CARDIOVASCULAR: Normal heart rate, Normal rhythm, No murmurs, rubs, gallops PULMONARY/CHEST: Clear to auscultation, no rhonchi, wheezes, or rales. Symmetrical breath sounds. Non-tender. ABDOMINAL: Non-distended, obese, soft, non-tender - no rebound or guarding.  BS normal. NEUROLOGIC: Non-focal, moving all four extremities, no gross sensory or motor deficits. EXTREMITIES: No clubbing, cyanosis, or edema SKIN: Warm, Dry, No erythema, No rash  ED Course  Procedures (including critical care time)  Date: 11/08/2012  Rate: 70  Rhythm: normal sinus rhythm  QRS Axis: normal  Intervals: normal  ST/T Wave abnormalities: normal  Conduction Disutrbances: none  Narrative Interpretation: unremarkable - no significant change compared to prior EKG from 08/18/2011    Labs Reviewed  POCT  I-STAT, CHEM 8 - Abnormal; Notable for the following:    Glucose, Bld 128 (*)    All other components within normal limits  POCT I-STAT TROPONIN I   Dg Chest Port 1 View  11/08/2012   *RADIOLOGY REPORT*  Clinical Data: Chest pain.  PORTABLE CHEST - 1 VIEW  Comparison: 02/22/2011  Findings: The heart size and pulmonary vascularity are normal. The lungs appear clear and expanded without focal air space disease or consolidation. No blunting of the costophrenic angles.  No pneumothorax.  Mediastinal contours appear intact.  No significant change since previous study.  IMPRESSION: No evidence of active pulmonary disease.   Original Report Authenticated By: Burman Nieves, M.D.   1. Chest pain     MDM  Katrina [name] is a 57 y.o. female chest pain, patient has no risk factors by herself besides obesity, she's never smoked, no hypertension or hyperlipidemia however the patient does have a very pertinent family history including all first degree relatives with vascular disease.  EKG is unremarkable, troponin is negative. Patient's pain is minor at this time, she did receive aspirin and a GI cocktail, suspect possible GERD but with patient family history patient is not a low-risk patient, I do not feel she is amenable to outpatient diagnostics at this time. Discussed with Dr. Toniann Fail for admission for chest pain rule out and further risk stratification.   Jones Skene, MD 11/08/12 [phone]

## 2012-11-08 NOTE — ED Notes (Signed)
Patient awoke from sleep with chest pain and shortness of breath.  Patient denies any nausea, vomiting or diaphoresis.  Patient states she started working out more recently and has chest pain after working out.

## 2012-11-08 NOTE — Progress Notes (Signed)
[facility] PROGRESS NOTE  KALE DOLS ZOX:[id number] DOB: September 23, 1955 DOA: 11/08/2012 PCP: Dois Davenport., MD I have seen and examined pt who is a 56yo with h/o GERD and hypothyroidism admitted this am by Dr Toniann Fail. She states CP decreased but still present, and reports relief with Gi cocktail but it is temporal. On exam she has chest wall reproducible tenderness. Will place pt on PPI FOR gerd, avoid NSAIDs given GERD, will place on ultram for musculoskeletal/ possible costochondritis and follow. CEs far neg, and echo wnl- no wall motion abnl.     Katrina [name]  [facility] Pager [phone]. If 7PM-7AM, please contact night-coverage at [email or web address], password [facility] 11/08/2012, 1:08 PM  LOS: 0 days

## 2012-11-08 NOTE — ED Notes (Signed)
Admitting MD at bedside.

## 2012-11-08 NOTE — Progress Notes (Signed)
*  PRELIMINARY RESULTS* Echocardiogram 2D Echocardiogram has been performed.  Katrina [name] 11/08/2012, 11:39 AM

## 2012-11-08 NOTE — Progress Notes (Signed)
Utilization review completed.  P.J. Delorese Sellin,RN,BSN Case Manager [phone]  

## 2012-11-08 NOTE — H&P (Signed)
[facility] History and Physical  Katrina [name] AVW:[id number] DOB: [birth year] DOA: 11/08/2012  Referring physician: ER physician. PCP: Dois Davenport., MD  Specialists: None.  Chief Complaint: Chest pain.  HPI: Katrina [name] is a 57 y.o. female history of hypothyroidism and GERD presents with complaints of chest pain. Early today morning 2:00 patient was with him because of chest pressure which was diffuse across the chest with mild shortness of breath. After patient reached ER her chest pain has resolved. Patient was given aspirin. Cardiac markers chest x-ray and EKG were unremarkable. Patient has been admitted for further observation. Patient states that over the last 2 weeks patient has been going for aerobics and gets easily short of breath. Denies any nausea vomiting abdominal pain fever chills or any productive cough.  Review of Systems: As presented in the history of presenting illness, rest negative.  Past Medical History  Diagnosis Date  . Thyroid disease    Past Surgical History  Procedure Laterality Date  . Abdominal hysterectomy     Social History:  reports that she has never smoked. She does not have any smokeless tobacco history on file. She reports that she does not drink alcohol or use illicit drugs. Home. where does patient live-- Can do ADLs. Can patient participate in ADLs?  Allergies  Allergen Reactions  . Penicillins Rash    Family History  Problem Relation Age of Onset  . Diabetes Mother   . Stroke Mother   . Coronary artery disease Father   . Coronary artery disease Brother       Prior to Admission medications   Medication Sig Start Date End Date Taking? Authorizing Provider  famotidine (PEPCID) 20 MG tablet Take 20 mg by mouth 2 (two) times daily as needed for heartburn.   Yes Historical Provider, MD  levothyroxine (SYNTHROID, LEVOTHROID) 125 MCG tablet Take 125 mcg by mouth daily before breakfast.   Yes Historical Provider, MD   Physical  Exam: Filed Vitals:   11/08/12 0251 11/08/12 0300 11/08/12 0400 11/08/12 0510  BP: 146/80 136/75 131/79 132/79  Pulse:  67 64 70  Temp: 97.7 F (36.5 C)     TempSrc: Oral     Resp: 17 13 15 22   SpO2: 96% 93% 93% 97%     General:  Well-developed and nourished.  Eyes: Anicteric no pallor.  ENT: No discharge from ears eyes nose mouth.  Neck: No mass felt.  Cardiovascular: S1-S2 heard.  Respiratory: No rhonchi or crepitations.  Abdomen: Soft nontender bowel sounds present.  Skin: No rash.  Musculoskeletal: No edema.  Psychiatric: Appears normal.  Neurologic: Alert and oriented to time place and person. Moves all extremities.  Labs on Admission:  Basic Metabolic Panel:  Recent Labs Lab 11/08/12 0333  NA 141  K 3.7  CL 104  GLUCOSE 128*  BUN 12  CREATININE 0.80   Liver Function Tests: No results found for this basename: AST, ALT, ALKPHOS, BILITOT, PROT, ALBUMIN,  in the last 168 hours No results found for this basename: LIPASE, AMYLASE,  in the last 168 hours No results found for this basename: AMMONIA,  in the last 168 hours CBC:  Recent Labs Lab 11/08/12 0333  HGB 14.3  HCT 42.0   Cardiac Enzymes: No results found for this basename: CKTOTAL, CKMB, CKMBINDEX, TROPONINI,  in the last 168 hours  BNP (last 3 results) No results found for this basename: PROBNP,  in the last 8760 hours CBG: No results found for this basename: GLUCAP,  in the last 168 hours  Radiological Exams on Admission: Dg Chest [facility]  11/08/2012   *RADIOLOGY REPORT*  Clinical Data: Chest pain.  PORTABLE CHEST - 1 VIEW  Comparison: 02/22/2011  Findings: The heart size and pulmonary vascularity are normal. The lungs appear clear and expanded without focal air space disease or consolidation. No blunting of the costophrenic angles.  No pneumothorax.  Mediastinal contours appear intact.  No significant change since previous study.  IMPRESSION: No evidence of active pulmonary disease.    Original Report Authenticated By: Burman Nieves, M.D.    EKG: Independently reviewed. Normal sinus rhythm.  Assessment/Plan Principal Problem:   Chest pain Active Problems:   Hypothyroidism   1. Chest pain - cycle cardiac markers. Aspirin. When necessary nitroglycerin. Since patient also had complained of some exertional shortness of breath check BNP and 2-D echo. 2. Hypothyroidism - continue Synthroid check TSH. 3. History of peptic ulcer disease/GERD - continue H2 blockers.    Code Status: Full code.  Family Communication: None.  Disposition Plan: Admit for observation.    Regina Ganci N. [facility] Pager [phone].  If 7PM-7AM, please contact night-coverage [email or web address] Password [email or web address] 11/08/2012, 6:01 AM

## 2012-11-09 DIAGNOSIS — K219 Gastro-esophageal reflux disease without esophagitis: Secondary | ICD-10-CM

## 2012-11-09 DIAGNOSIS — R1310 Dysphagia, unspecified: Secondary | ICD-10-CM | POA: Diagnosis present

## 2012-11-09 DIAGNOSIS — R1013 Epigastric pain: Secondary | ICD-10-CM

## 2012-11-09 DIAGNOSIS — R079 Chest pain, unspecified: Secondary | ICD-10-CM

## 2012-11-09 LAB — BASIC METABOLIC PANEL
BUN: 14 mg/dL (ref 6–23)
GFR calc non Af Amer: 77 mL/min — ABNORMAL LOW (ref >90)
Glucose, Bld: 111 mg/dL — ABNORMAL HIGH (ref 70–99)
Potassium: 4.7 mEq/L (ref 3.5–5.1)

## 2012-11-09 LAB — CBC
HCT: 40 % (ref 36.0–46.0)
Hemoglobin: 13.7 g/dL (ref 12.0–15.0)
MCH: 29.3 pg (ref 26.0–34.0)
MCHC: 34.3 g/dL (ref 30.0–36.0)

## 2012-11-09 MED ORDER — GI COCKTAIL ~~LOC~~
30.0000 mL | Freq: Once | ORAL | Status: AC
  Administered 2012-11-09: 30 mL via ORAL
  Filled 2012-11-09 (×2): qty 30

## 2012-11-09 NOTE — Progress Notes (Signed)
Pt ambulated herself out to the hallway to get help. She appeared very frantic stating she "has something stuck" in her throat. She was breathing but very anxious and in distress. She just ate lunch, only about 30% when I assessed her tray. She told me she felt like she was about to start chocking. She said she was going to try to throw up in which she immediately went to the bathroom and threw up her lunch. She still didn't get any relief and continued to get more anxious. I notified Dr. Suanne Marker and orders received for a gi cocktail. Pt began having severe chest pain, diaphoretic and continued to attempt to throw up. She finally took the gi cocktail in which it gave her relief. MD aware of events, Pt to have GI eval. Pt left in stable and calm condition

## 2012-11-09 NOTE — Consult Note (Signed)
[facility] Consult: 1:47 PM 11/09/2012   Referring Provider: dysphagia.   Primary Care Physician:  [facility]. Primary Gastroenterologist: none  Reason for Consultation:  dysphagia  HPI: Katrina [name] is a 57 y.o. female.  Hypothyroidism.  Takes Pepcid for GERD Admitted yesterday with chest pain that awakened her.  Pressure was intense, mild SOB, no sweats or chills.  No nausea, vomiting.  No cough.  Pain eased off after an hour. Thus far she had ruled out for MI/ischemia  Pt has several year hx of dysphagia/odynophagia.  Trouble with both liquids and solids.  Meats and broccolli are the worst.  The longer she has not eaten/drank, the worse the problem.  It feels like a spasm in her esophagus.  She also has non frequent heartburn that is controlled with nightly Pepcid.  No regurgitation.  No PPI in use.  No EGDs/UGIs or GI eval in past.   No anorexia, no unexplained weight loss.  No constipation.  Takes 400 Advil daily for low back pain  This lunch time she had really bad spell of spasm after trying to swallow broccolli. GI cocktail gave her relief.    Past Medical History  Diagnosis Date  . Thyroid disease   . Chest pain at rest 11/07/2012  . Hypothyroidism   . Shortness of breath 11/08/2012    RECENTLY  & WHEN i EXERCISE"  . GERD (gastroesophageal reflux disease)     Past Surgical History  Procedure Laterality Date  . Abdominal hysterectomy      Prior to Admission medications   Medication Sig Start Date End Date Taking? Authorizing Provider  famotidine (PEPCID) 20 MG tablet Take 20 mg by mouth 2 (two) times daily as needed for heartburn.   Yes Historical Provider, MD  levothyroxine (SYNTHROID, LEVOTHROID) 125 MCG tablet Take 125 mcg by mouth daily before breakfast.   Yes Historical Provider, MD    Scheduled Meds: . aspirin EC  325 mg Oral Daily  . enoxaparin (LOVENOX) injection  40 mg Subcutaneous Q24H  .  levothyroxine  125 mcg Oral QAC breakfast  . pantoprazole  40 mg Oral BID AC  . sodium chloride  3 mL Intravenous Q12H  . sodium chloride  3 mL Intravenous Q12H   Infusions:   PRN Meds: sodium chloride, acetaminophen, acetaminophen, famotidine, ondansetron (ZOFRAN) IV, ondansetron, sodium chloride, traMADol   Allergies as of 11/08/2012 - Review Complete 11/08/2012  Allergen Reaction Noted  . Penicillins Rash 12/28/2007    Family History  Problem Relation Age of Onset  . Diabetes Mother   . Stroke Mother   . Coronary artery disease Father   . Coronary artery disease Brother     History   Social History  . Marital Status: Divorced    Spouse Name: N/A    Number of Children: N/A  . Years of Education: N/A   Occupational History  . Not on file.   Social History Main Topics  . Smoking status: Never Smoker   . Smokeless tobacco: Never Used  . Alcohol Use: No  . Drug Use: No  . Sexually Active: Not on file   Other Topics Concern  . Not on file   Social History Narrative  . No narrative on file    REVIEW OF SYSTEMS: See HPI for 12 point review pertinent details  PHYSICAL EXAM: Vital signs in last 24 hours: Temp:  [97.9 F (36.6 C)-98.4 F (36.9 C)] 97.9 F (36.6 C) (07/24 0418) Pulse Rate:  [63-70] 63 (07/24  8657) Resp:  [18] 18 (07/24 0418) BP: (120-124)/(66-75) 120/70 mmHg (07/24 0418) SpO2:  [95 %-96 %] 96 % (07/24 0418) Weight:  [83.779 kg (184 lb 11.2 oz)] 83.779 kg (184 lb 11.2 oz) (07/23 1900)  General: well lookin, not ill Head:  No  Trauma, old scar on forhead  Eyes:  No icterus or pallor Ears:  n0t HOH  Nose:  No discharge Mouth:  No lesions.  Mm moist Neck:  No mass, no adenopathy Lungs:  Clear bil.  No hoarseness Heart: rrr.  No mrg Abdomen:  Soft, NT, NS, active BS.  No mass.   Rectal: not done   Musc/Skeltl: no deformity or swelling Extremities:  No swelling  Neurologic:  No tremor.  No confusion Skin:  No rash or sores Tattoos:   none   Psych:  Pleasnt, relaxed.   Intake/Output from previous day: 07/23 0701 - 07/24 0700 In: 606 [P.O.:600; I.V.:6] Out: -  Intake/Output this shift:    LAB RESULTS:  Recent Labs  11/08/12 0333 11/08/12 0919 11/09/12 0457  WBC  --  5.7 6.5  HGB 14.3 14.4 13.7  HCT 42.0 42.4 40.0  PLT  --  239 227   BMET Lab Results  Component Value Date   NA 139 11/09/2012   NA 141 11/08/2012   NA 141 05/05/2012   K 4.7 11/09/2012   K 3.7 11/08/2012   K 3.8 05/05/2012   CL 103 11/09/2012   CL 104 11/08/2012   CL 105 05/05/2012   CO2 28 11/09/2012   CO2 25 05/05/2012   CO2 27 02/22/2011   GLUCOSE 111* 11/09/2012   GLUCOSE 128* 11/08/2012   GLUCOSE 96 05/05/2012   BUN 14 11/09/2012   BUN 12 11/08/2012   BUN 10 05/05/2012   CREATININE 0.82 11/09/2012   CREATININE 0.71 11/08/2012   CREATININE 0.80 11/08/2012   CALCIUM 9.3 11/09/2012   CALCIUM 8.7 05/05/2012   CALCIUM 10.4 02/22/2011   LFT  Recent Labs  11/08/12 0919  PROT 7.2  ALBUMIN 3.9  AST 18  ALT 25  ALKPHOS 83  BILITOT 0.4  BILIDIR <0.1  IBILI NOT CALCULATED   PT/INR No results found for this basename: INR, PROTIME   Hepatitis Panel No results found for this basename: HEPBSAG, HCVAB, HEPAIGM, HEPBIGM,  in the last 72 hours C-Diff No components found with this basename: cdiff    Drugs of Abuse  No results found for this basename: labopia, cocainscrnur, labbenz, amphetmu, thcu, labbarb     RADIOLOGY STUDIES: Dg Chest Port 1 View 11/08/2012     Findings: The heart size and pulmonary vascularity are normal. The lungs appear clear and expanded without focal air space disease or consolidation. No blunting of the costophrenic angles.  No pneumothorax.  Mediastinal contours appear intact.  No significant change since previous study.  IMPRESSION: No evidence of active pulmonary disease.   Original Report Authenticated By: Burman Nieves, M.D.    ENDOSCOPIC STUDIES: None ever  IMPRESSION: *  Dysphagia/odynophagia.  Suspect  esophageal spasm along with GERD.   *  Chest pain/pressure.  Ruling out for cardiac cause *  Hypothyroidism.    PLAN: *  Too late in to get esophagram completed.  Will defer decision re further testing to Dr Leone Payor.    LOS: 1 day   Jennye Moccasin  11/09/2012, 1:47 PM Pager: [phone]  [facility] Attending  I have also seen and assessed the patient and agree with the above note.  She has a good story  for esophageal stricture and/or spasm.  I think EGD makes most sense, with plans for possible dilation.  The risks and benefits as well as alternatives of endoscopic procedure(s) have been discussed and reviewed. All questions answered. The patient agrees to proceed.   Will do tomorrow - not sure when yet. Could go home after that.  Iva Boop, MD, [facility] [phone] (pager) 11/09/2012 6:20 PM

## 2012-11-09 NOTE — Progress Notes (Addendum)
[facility] PROGRESS NOTE  MICAL BRUN ZOX:[id number] DOB: 28-Oct-1955 DOA: 11/08/2012 PCP: Dois Davenport., MD  Assessment/Plan Chest pain  -MI ruled out by enzymes -Negx3 and echo wnl -more likely GI related, pt with GERD and dysphagia/?esophageal spasm -continue PPI, GI cocktail prn -I have consulted GI for possible endo/further recs Active Problems:  Hypothyroidism -continue synthroid History of peptic ulcer disease/GERD - continue PPI.  Code Status: Full Family Communication: none at bedside Disposition Plan: to home when medically stable   Consultants:  GI  Procedures:  echo Study Conclusions  Left ventricle: The cavity size was normal. Wall thickness was normal. Systolic function was normal. The estimated ejection fraction was in the range of 60% to 65%. Wall motion was normal; there were no regional wall motion abnormalities. Left ventricular diastolic function parameters were normal.  Impressions:  - Normal study.  Antibiotics:  none  HPI/Subjective: C/o increased pain with swallowing today and feeling of food stuck in her throat>> made her self vomit try to get it out, some relief but still pain >> relieved with GI cocktail. States has been happening intermittently for sometime and seems to be worse the longer she goes without eating.  Objective: Filed Vitals:   11/08/12 1415 11/08/12 1900 11/08/12 1944 11/09/12 0418  BP: 124/66  121/75 120/70  Pulse: 64  70 63  Temp: 97.9 F (36.6 C)  98.4 F (36.9 C) 97.9 F (36.6 C)  TempSrc: Oral  Oral Oral  Resp: 18  18 18   Height:      Weight:  83.779 kg (184 lb 11.2 oz)    SpO2: 96%  95% 96%    Intake/Output Summary (Last 24 hours) at 11/09/12 1225 Last data filed at 11/08/12 2021  Gross per 24 hour  Intake    363 ml  Output      0 ml  Net    363 ml   Filed Weights   11/08/12 1900  Weight: 83.779 kg (184 lb 11.2 oz)   Exam:  General: alert & oriented x  In NAD Cardiovascular: RRR,  nl S1 s2, less chest wall tenderness-to the L. of sternum Respiratory: CTAB Abdomen: soft +BS NT/ND, no masses palpable Extremities: No cyanosis and no edema     Data Reviewed: Basic Metabolic Panel:  Recent Labs Lab 11/08/12 0333 11/08/12 0919 11/09/12 0457  NA 141  --  139  K 3.7  --  4.7  CL 104  --  103  CO2  --   --  28  GLUCOSE 128*  --  111*  BUN 12  --  14  CREATININE 0.80 0.71 0.82  CALCIUM  --   --  9.3   Liver Function Tests:  Recent Labs Lab 11/08/12 0919  AST 18  ALT 25  ALKPHOS 83  BILITOT 0.4  PROT 7.2  ALBUMIN 3.9   No results found for this basename: LIPASE, AMYLASE,  in the last 168 hours No results found for this basename: AMMONIA,  in the last 168 hours CBC:  Recent Labs Lab 11/08/12 0333 11/08/12 0919 11/09/12 0457  WBC  --  5.7 6.5  HGB 14.3 14.4 13.7  HCT 42.0 42.4 40.0  MCV  --  85.8 85.5  PLT  --  239 227   Cardiac Enzymes:  Recent Labs Lab 11/08/12 0919 11/08/12 1413 11/08/12 1908  TROPONINI <0.30 <0.30 <0.30   BNP (last 3 results)  Recent Labs  11/08/12 0919  PROBNP 31.4   CBG: No results found for  this basename: GLUCAP,  in the last 168 hours  No results found for this or any previous visit (from the past 240 hour(s)).   Studies: Dg Chest Port 1 View  11/08/2012   *RADIOLOGY REPORT*  Clinical Data: Chest pain.  PORTABLE CHEST - 1 VIEW  Comparison: 02/22/2011  Findings: The heart size and pulmonary vascularity are normal. The lungs appear clear and expanded without focal air space disease or consolidation. No blunting of the costophrenic angles.  No pneumothorax.  Mediastinal contours appear intact.  No significant change since previous study.  IMPRESSION: No evidence of active pulmonary disease.   Original Report Authenticated By: Burman Nieves, M.D.    Scheduled Meds: . aspirin EC  325 mg Oral Daily  . enoxaparin (LOVENOX) injection  40 mg Subcutaneous Q24H  . gi cocktail  30 mL Oral Once  . levothyroxine   125 mcg Oral QAC breakfast  . pantoprazole  40 mg Oral BID AC  . sodium chloride  3 mL Intravenous Q12H  . sodium chloride  3 mL Intravenous Q12H   Continuous Infusions:   Principal Problem:   Chest pain Active Problems:   Hypothyroidism    Time spent: 35    [facility]  [facility] Pager [phone]. If 7PM-7AM, please contact night-coverage at [email or web address], password [facility] 11/09/2012, 12:25 PM  LOS: 1 day

## 2012-11-10 ENCOUNTER — Encounter (HOSPITAL_COMMUNITY)
Admission: EM | Disposition: A | Discharge status: Home / Self Care | Payer: Self-pay | Source: Home / Self Care | Attending: Emergency Medicine

## 2012-11-10 ENCOUNTER — Encounter (HOSPITAL_COMMUNITY): Payer: Self-pay | Admitting: *Deleted

## 2012-11-10 DIAGNOSIS — E039 Hypothyroidism, unspecified: Secondary | ICD-10-CM

## 2012-11-10 DIAGNOSIS — K200 Eosinophilic esophagitis: Secondary | ICD-10-CM

## 2012-11-10 DIAGNOSIS — K222 Esophageal obstruction: Secondary | ICD-10-CM | POA: Diagnosis present

## 2012-11-10 HISTORY — DX: Eosinophilic esophagitis: K20.0

## 2012-11-10 HISTORY — PX: ESOPHAGOGASTRODUODENOSCOPY (EGD) WITH ESOPHAGEAL DILATION: SHX5812

## 2012-11-10 SURGERY — ESOPHAGOGASTRODUODENOSCOPY (EGD) WITH ESOPHAGEAL DILATION
Anesthesia: Moderate Sedation

## 2012-11-10 MED ORDER — MIDAZOLAM HCL 5 MG/ML IJ SOLN
INTRAMUSCULAR | Status: AC
  Filled 2012-11-10: qty 2

## 2012-11-10 MED ORDER — PANTOPRAZOLE SODIUM 40 MG PO TBEC
40.0000 mg | DELAYED_RELEASE_TABLET | Freq: Every day | ORAL | Status: DC
  Administered 2012-11-10: 40 mg via ORAL

## 2012-11-10 MED ORDER — SODIUM CHLORIDE 0.9 % IV SOLN: INTRAVENOUS | Status: DC

## 2012-11-10 MED ORDER — FENTANYL CITRATE 0.05 MG/ML IJ SOLN
INTRAMUSCULAR | Status: DC | PRN
  Administered 2012-11-10 (×2): 25 ug via INTRAVENOUS

## 2012-11-10 MED ORDER — FENTANYL CITRATE 0.05 MG/ML IJ SOLN
INTRAMUSCULAR | Status: AC
  Filled 2012-11-10: qty 2

## 2012-11-10 MED ORDER — DIPHENHYDRAMINE HCL 50 MG/ML IJ SOLN
INTRAMUSCULAR | Status: AC
  Filled 2012-11-10: qty 1

## 2012-11-10 MED ORDER — MIDAZOLAM HCL 10 MG/2ML IJ SOLN
INTRAMUSCULAR | Status: DC | PRN
  Administered 2012-11-10 (×3): 2 mg via INTRAVENOUS
  Administered 2012-11-10 (×2): 1 mg via INTRAVENOUS

## 2012-11-10 MED ORDER — BUTAMBEN-TETRACAINE-BENZOCAINE 2-2-14 % EX AERO
INHALATION_SPRAY | CUTANEOUS | Status: DC | PRN
  Administered 2012-11-10: 1 via TOPICAL

## 2012-11-10 MED ORDER — OMEPRAZOLE 40 MG PO CPDR: 40.0000 mg | DELAYED_RELEASE_CAPSULE | Freq: Every day | ORAL | Status: DC

## 2012-11-10 NOTE — Care Management (Signed)
Care Manager did speak to pt in reference to PCP. CM will provide pt with the Number to the [facility] and [facility]. Pt to make her own appointment. No further needs from CM at this time. Gala Lewandowsky, RN,BSN [phone]

## 2012-11-10 NOTE — Op Note (Signed)
[facility] [address] [place], [address]   ENDOSCOPY PROCEDURE REPORT  PATIENT: Katrina [name], Katrina [name]  MR#: [id number] BIRTHDATE: Sep 10, 1955 , 56  yrs. old GENDER: Female ENDOSCOPIST: Iva Boop, MD, [facility] REFERRED BY:   Hospitalist PROCEDURE DATE:  11/10/2012 PROCEDURE:  EGD w/ biopsy  and dilation balloon < 30 mm ASA CLASS:     Class II INDICATIONS:  Dysphagia.   Chest pain. MEDICATIONS: Fentanyl 75 mcg IV and Versed 8 mg IV TOPICAL ANESTHETIC: Cetacaine Spray  DESCRIPTION OF PROCEDURE: After the risks benefits and alternatives of the procedure were thoroughly explained, informed consent was obtained.  The Pentax Gastroscope [id number] endoscope was introduced through the mouth and advanced to the second portion of the duodenum. Without limitations.  The instrument was slowly withdrawn as the mucosa was fully examined.        ESOPHAGUS: Eosinophilic esophagitis was suspected with mucosal changes that included circumferential folds, felinization of the esophagus, longitudinal furrows and white spots were found in the entire esophagus. Multiple biopsies taken throughout esophagus. A Schatzki ring was found at the gastroesophageal junction.   It was dilated with a balloon to 18 mm w/ some effect, slight heme.  The remainder of the upper endoscopy exam was otherwise normal. Retroflexed views revealed no abnormalities.     The scope was then withdrawn from the patient and the procedure completed.  COMPLICATIONS: There were no complications. ENDOSCOPIC IMPRESSION: 1.   Eosinophilic esophagitis changes were suspected in the entire esophagus - biopsies taken 2.   Schatzki ring was found at the gastroesophageal junction - dilated 18 mm 3.   The remainder of the upper endoscopy exam was otherwise normal  RECOMMENDATIONS: 1.  Clear liquids until 1130, then soft foods rest of day.  Resume prior diet tomorrow. 2.  Office will call with  results/plans 3. OK for dc later today 4. Should go home on a daily PPI - OTC omeprazole, lansoprazole or esomeprazole may be most cost effective for - ideally she should take twice the OTC dose daily (40 of omeprazole or esomeprazole or 30 mg lansoprazole). 5. GERD diet    eSigned:  Iva Boop, MD, [facility] 11/10/2012 9:16 AM   CC:The Patient  PATIENT NAME:  Katrina [name], Katrina [name] MR#: [id number]

## 2012-11-10 NOTE — Discharge Summary (Signed)
Physician Discharge Summary  [name]:[id number] DOB: Sep 16, 1955 DOA: 11/08/2012  PCP: Dois Davenport., MD  Admit date: 11/08/2012 Discharge date: 11/10/2012  Time spent: >30 minutes  Recommendations for Outpatient Follow-up:        Follow-up Information   Follow up with Dois Davenport., MD. (PCP in 1-2weeks, call for appt upon discharge)    Contact information:   [address] [place] [address] [phone]       Follow up with Stan Head, MD. (in 2weeks, call for appt upon discharge)    Contact information:   [address] [place] [address] [phone]       Discharge Diagnoses:  Principal Problem:   Chest pain Active Problems:   Hypothyroidism   Dysphagia, unspecified(787.20)   Esophageal ring, acquired   Discharge Condition: Improved/stable   Diet recommendation: Soft diet for the rest of today and then GERD diet beginning in a.m.  Filed Weights   11/08/12 1900 11/10/12 0601  Weight: 83.779 kg (184 lb 11.2 oz) 82.464 kg (181 lb 12.8 oz)    History of present illness:  Katrina [name] is a 57 y.o. female history of hypothyroidism and GERD presents with complaints of chest pain. Early today morning 2:00 patient was with him because of chest pressure which was diffuse across the chest with mild shortness of breath. After patient reached ER her chest pain has resolved. Patient was given aspirin. Cardiac markers chest x-ray and EKG were unremarkable. Patient has been admitted for further observation. Patient states that over the last 2 weeks patient has been going for aerobics and gets easily short of breath. Denies any nausea vomiting abdominal pain fever chills or any productive cough.      Hospital Course:  Chest pain  -Upon admission cardiac enzymes were cycled and a 2-D echocardiogram was obtained -MI ruled out by enzymes -Negx3 and echo wnl  -She was placed on  PPI, GI cocktail prn  -GI was consulted and an EGD was  done on 7/25 revealing a Schatzki's ring which was dilated and ?eosinophilic esophagitis changes>> biopsies were done and patient is to followup with GI for results and further management as appropriate -She is to continue PPI upon discharge --The impression was that the chest pain was noncardiac in pt with GERD and dysphagia>> secondary to Schatzki ring and EGD findings with? Eosinophilic esophagitis changes per EGD.  Active Problems:  Hypothyroidism  -continue synthroid  History of peptic ulcer disease/GERD - continue PPI.   Procedures: EGD w/ biopsy and dilation balloon < 30 mm  ENDOSCOPIC IMPRESSION:  1. Eosinophilic esophagitis changes were suspected in the entire  esophagus - biopsies taken  2. Schatzki ring was found at the gastroesophageal junction -  dilated 18 mm  3. The remainder of the upper endoscopy exam was otherwise normal  RECOMMENDATIONS:  1. Clear liquids until 1130, then soft foods rest of day. Resume  prior diet tomorrow.  2. Office will call with results/plans  3. OK for dc later today  4. Should go home on a daily PPI - OTC omeprazole, lansoprazole or  esomeprazole may be most cost effective for - ideally she should  take twice the OTC dose daily (40 of omeprazole or esomeprazole or  30 mg lansoprazole).  5. GERD diet  2-D echocardiogram Study Conclusions  Left ventricle: The cavity size was normal. Wall thickness was normal. Systolic function was normal. The estimated ejection fraction was in the range of 60% to 65%. Wall  motion was normal; there were no regional wall motion abnormalities. Left ventricular diastolic function parameters were normal.  Impressions:  - Normal study.   Consultations:  GI  Discharge Exam: Filed Vitals:   11/10/12 0900 11/10/12 0905 11/10/12 1042 11/10/12 1351  BP: 152/74 114/53 117/68 111/58  Pulse:  69 61 76  Temp:  98.3 F (36.8 C) 97.4 F (36.3 C) 98.6 F (37 C)  TempSrc:  Oral Oral Oral  Resp: 14 14 18  20   Height:      Weight:      SpO2: 95% 93% 98% 96%   Exam:  General: alert & oriented x In NAD  Cardiovascular: RRR, nl S1 s2, less chest wall tenderness-to the L. of sternum  Respiratory: CTAB  Abdomen: soft +BS NT/ND, no masses palpable  Extremities: No cyanosis and no edema     Discharge Instructions  Discharge Orders   Future Orders Complete By Expires     Discharge instructions  As directed     Comments:      GERD diet    Increase activity slowly  As directed         Medication List    STOP taking these medications       famotidine 20 MG tablet  Commonly known as:  PEPCID      TAKE these medications       levothyroxine 125 MCG tablet  Commonly known as:  SYNTHROID, LEVOTHROID  Take 125 mcg by mouth daily before breakfast.     omeprazole 40 MG capsule  Commonly known as:  PRILOSEC  Take 1 capsule (40 mg total) by mouth daily.       Allergies  Allergen Reactions  . Penicillins Rash       Follow-up Information   Follow up with Dois Davenport., MD. (PCP in 1-2weeks, call for appt upon discharge)    Contact information:   [address] [facility] [address] [place] [address] [phone]       Follow up with Stan Head, MD. (in 2weeks, call for appt upon discharge)    Contact information:   [address] [place] [address] [phone]        The results of significant diagnostics from this hospitalization (including imaging, microbiology, ancillary and laboratory) are listed below for reference.    Significant Diagnostic Studies: Dg Chest Port 1 View  11/08/2012   *RADIOLOGY REPORT*  Clinical Data: Chest pain.  PORTABLE CHEST - 1 VIEW  Comparison: 02/22/2011  Findings: The heart size and pulmonary vascularity are normal. The lungs appear clear and expanded without focal air space disease or consolidation. No blunting of the costophrenic angles.  No pneumothorax.  Mediastinal contours appear intact.  No significant change since  previous study.  IMPRESSION: No evidence of active pulmonary disease.   Original Report Authenticated By: Burman Nieves, M.D.    Microbiology: No results found for this or any previous visit (from the past 240 hour(s)).   Labs: Basic Metabolic Panel:  Recent Labs Lab 11/08/12 0333 11/08/12 0919 11/09/12 0457  NA 141  --  139  K 3.7  --  4.7  CL 104  --  103  CO2  --   --  28  GLUCOSE 128*  --  111*  BUN 12  --  14  CREATININE 0.80 0.71 0.82  CALCIUM  --   --  9.3   Liver Function Tests:  Recent Labs Lab 11/08/12 0919  AST 18  ALT 25  ALKPHOS  83  BILITOT 0.4  PROT 7.2  ALBUMIN 3.9   No results found for this basename: LIPASE, AMYLASE,  in the last 168 hours No results found for this basename: AMMONIA,  in the last 168 hours CBC:  Recent Labs Lab 11/08/12 0333 11/08/12 0919 11/09/12 0457  WBC  --  5.7 6.5  HGB 14.3 14.4 13.7  HCT 42.0 42.4 40.0  MCV  --  85.8 85.5  PLT  --  239 227   Cardiac Enzymes:  Recent Labs Lab 11/08/12 0919 11/08/12 1413 11/08/12 1908  TROPONINI <0.30 <0.30 <0.30   BNP: BNP (last 3 results)  Recent Labs  11/08/12 0919  PROBNP 31.4   CBG: No results found for this basename: GLUCAP,  in the last 168 hours     Signed:  Kela Millin  [facility] 11/10/2012, 3:37 PM

## 2012-11-13 ENCOUNTER — Encounter (HOSPITAL_COMMUNITY): Payer: Self-pay | Admitting: Internal Medicine

## 2012-11-14 NOTE — Progress Notes (Signed)
Quick Note:  Biopsies show some inflammation - probably acid reflux Hope she is swallowing better after dilation I recommend she stay on a PPI (recommended OTC PPI at dc) She should see me in September ______

## 2012-11-30 ENCOUNTER — Telehealth: Payer: Self-pay | Admitting: Gastroenterology

## 2012-11-30 NOTE — Telephone Encounter (Signed)
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  She is advised that I am unaware of an interaction with the two meds.  She is experiencing a rapid heart rate at times, "and this is what happens when my thyroid acts up" .  She has been taking both meds 12 hours apart.  She is advised that she needs to arrange an appt with her primary care.  Dr. Leone Payor are you aware of any interactions with the two drugs?

## 2012-12-01 NOTE — Telephone Encounter (Signed)
Patient advised of Dr. Gessner's recommendations 

## 2012-12-01 NOTE — Telephone Encounter (Signed)
There are possible absorption issues when taking omeprazole and Synthroid but it would reduce absorption and cause hypothyroidism and lower heart rate so i agree do notthink that is issue

## 2012-12-11 ENCOUNTER — Encounter: Payer: Self-pay | Admitting: Physician Assistant

## 2012-12-11 ENCOUNTER — Ambulatory Visit (INDEPENDENT_AMBULATORY_CARE_PROVIDER_SITE_OTHER): Payer: Self-pay | Admitting: Physician Assistant

## 2012-12-11 VITALS — BP 112/78 | HR 67 | Ht 62.0 in | Wt 180.1 lb

## 2012-12-11 DIAGNOSIS — R140 Abdominal distension (gaseous): Secondary | ICD-10-CM

## 2012-12-11 DIAGNOSIS — R1013 Epigastric pain: Secondary | ICD-10-CM

## 2012-12-11 DIAGNOSIS — R141 Gas pain: Secondary | ICD-10-CM

## 2012-12-11 MED ORDER — OMEPRAZOLE-SODIUM BICARBONATE 40-1100 MG PO CAPS: 1.0000 | ORAL_CAPSULE | Freq: Every day | ORAL | Status: DC

## 2012-12-11 MED ORDER — SUCRALFATE 1 GM/10ML PO SUSP: 1.0000 g | Freq: Four times a day (QID) | ORAL | Status: DC

## 2012-12-11 NOTE — Progress Notes (Signed)
Subjective:    Patient ID: Katrina [name], female    DOB: February 07, 1956, 57 y.o.   MRN: [id number]  HPI Katrina [name] is a pleasant 57 year old white female known to Dr. Leone Payor from recent evaluation while she was hospitalized. She had an admission in July of 2014 with complaints of chest pain and dysphagia and underwent upper endoscopy with finding of suspected eosinophilic esophagitis and a Schatzki's ring at the GE junction which was Brent dilated to 18 mm. She was to be discharged on twice daily PPI. Biopsies returned showing a minimally inflamed score squamous lined mucosa with focally increased use in the pills up to 11 per high-power field. Eosinophilic esophagitis was felt to be in the differential however reflux esophagitis was favored. Patient comes in today as an urgent work in complaining of epigastric pain. She says her swallowing has been better. She has been taking over-the-counter omeprazole 20 mg once daily as she has no insurance. She says over the past 10 days she has been having upper abdominal pain which has been fairly constant but worse with any by mouth intake. She says anything she puts in her stomach feels like a "rock". She also feels bloated and distended in her upper abdomen. She's had some vague nausea, but no vomiting. She does state that she has been taking at least 4-6 Advil every day for leg pain over the past few weeks. She has stopped he had no over the past 2 days. She has not had any fever, chills, no change in her bowel habits melena or hematochezia .    Review of Systems  Constitutional: Negative.   HENT: Negative.   Eyes: Negative.   Respiratory: Positive for shortness of breath.   Cardiovascular: Negative.   Gastrointestinal: Positive for abdominal pain and abdominal distention.  Endocrine: Negative.   Genitourinary: Negative.   Musculoskeletal: Positive for myalgias and arthralgias.  Skin: Negative.   Allergic/Immunologic: Negative.   Neurological: Negative.    Hematological: Negative.   Psychiatric/Behavioral: Negative.    Outpatient Prescriptions Prior to Visit  Medication Sig Dispense Refill  . levothyroxine (SYNTHROID, LEVOTHROID) 125 MCG tablet Take 125 mcg by mouth daily before breakfast.      . omeprazole (PRILOSEC) 40 MG capsule Take 1 capsule (40 mg total) by mouth daily.  30 capsule  0   No facility-administered medications prior to visit.       Allergies  Allergen Reactions  . Penicillins Rash   Patient Active Problem List   Diagnosis Date Noted  . Esophageal ring, acquired 11/10/2012  . Dysphagia, unspecified(787.20) 11/09/2012  . Chest pain 11/08/2012  . Hypothyroidism 11/08/2012  . 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 BACK PAIN 02/21/2008  . HYPOTHYROIDISM 12/28/2007  . GERD 12/28/2007  . 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 HEADACHE 12/28/2007   History  Substance Use Topics  . Smoking status: Never Smoker   . Smokeless tobacco: Never Used  . Alcohol Use: No    Objective:   Physical Exam  well-developed white female in no acute distress, pleasant blood pressure 112/78 pulse 67 height 5 foot 2 weight 180. HEENT nontraumatic normocephalic EOMI PERRLA sclera anicteric, Cardiovascular regular rate and rhythm with S1-S2 no murmur or gallop, Ulnar clear bilaterally, Abdomen soft mildly tender in the epigastrium there is no guarding or rebound no palpable mass or hepatosplenomegaly bowel sounds are active, Rectal exam not done, Acuity is no clubbing cyanosis or edema skin warm and dry, Psych mood and affect  normal and appropriate        Assessment & Plan:  #36  57 year old female with recent admission with chest pain and dysphagia felt secondary to reflux esophagitis. Patient had focally increased by mouth senna feels but biopsies not diagnostic of eosinophilic esophagitis. #2 ten-day history of epigastric pain and bloating postprandially. 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 Could not rule out gallbladder disease etc. Patient states he did have a previous gallbladder workup which was negative a few years ago  Plan; patient is given samples of Zegerid 40 mg-to take once daily in place of the omeprazole over the next couple of weeks She is instructed to stop all NSAIDs and use Tylenol as needed for pain Start Carafate liquid 1 g 4 times daily between meals and at bedtime x1 week Patient is asked to call back in one week if her symptoms have not significantly improved we'll consider further diagnostic evaluation with upper abdominal ultrasound and labs

## 2012-12-11 NOTE — Patient Instructions (Addendum)
We sent a  prescription to [facility]. 1.Carafate Liquid.  We have given you samples of Zegerid. Take 1 tab daily until you finish the samples.  Take no inflammatories at this time.

## 2012-12-12 NOTE — Progress Notes (Signed)
Note that in assessment she had focally increased eosinophils  Agree w/ Ms. Esterwood's management.

## 2013-01-11 ENCOUNTER — Telehealth: Payer: Self-pay | Admitting: Internal Medicine

## 2013-01-11 NOTE — Telephone Encounter (Signed)
Patient is advised that she needs to see her primary care for refills.

## 2013-01-19 ENCOUNTER — Other Ambulatory Visit: Payer: Self-pay | Admitting: *Deleted

## 2013-01-19 ENCOUNTER — Other Ambulatory Visit: Payer: Self-pay | Admitting: Obstetrics & Gynecology

## 2013-01-19 DIAGNOSIS — N644 Mastodynia: Secondary | ICD-10-CM

## 2013-02-01 ENCOUNTER — Ambulatory Visit
Admission: RE | Admit: 2013-02-01 | Discharge: 2013-02-01 | Disposition: A | Discharge status: Home / Self Care | Payer: No Typology Code available for payment source | Source: Ambulatory Visit | Attending: Obstetrics & Gynecology | Admitting: Obstetrics & Gynecology

## 2013-02-01 ENCOUNTER — Ambulatory Visit
Admission: RE | Admit: 2013-02-01 | Discharge: 2013-02-01 | Disposition: A | Discharge status: Home / Self Care | Payer: No Typology Code available for payment source | Source: Ambulatory Visit | Attending: *Deleted | Admitting: *Deleted

## 2013-02-01 DIAGNOSIS — N644 Mastodynia: Secondary | ICD-10-CM

## 2013-02-22 ENCOUNTER — Other Ambulatory Visit: Payer: Self-pay

## 2013-07-27 ENCOUNTER — Other Ambulatory Visit: Payer: Self-pay | Admitting: Obstetrics & Gynecology

## 2013-07-27 DIAGNOSIS — N644 Mastodynia: Secondary | ICD-10-CM

## 2013-08-21 ENCOUNTER — Ambulatory Visit
Admission: RE | Admit: 2013-08-21 | Discharge: 2013-08-21 | Disposition: A | Discharge status: Home / Self Care | Payer: No Typology Code available for payment source | Source: Ambulatory Visit | Attending: Obstetrics & Gynecology | Admitting: Obstetrics & Gynecology

## 2013-08-21 DIAGNOSIS — N644 Mastodynia: Secondary | ICD-10-CM

## 2013-11-23 ENCOUNTER — Telehealth: Payer: Self-pay | Admitting: Internal Medicine

## 2013-11-23 MED ORDER — SUCRALFATE 1 GM/10ML PO SUSP: 1.0000 g | Freq: Four times a day (QID) | ORAL | Status: DC

## 2013-11-23 MED ORDER — OMEPRAZOLE 40 MG PO CPDR: 40.0000 mg | DELAYED_RELEASE_CAPSULE | Freq: Two times a day (BID) | ORAL | Status: DC

## 2013-11-23 NOTE — Telephone Encounter (Signed)
Patient with a history of schatzki ring and EE.  Last year.  She has stopped her omeprazole last year.  She c/o "stomach pain".  She reports that pain she was having chest pain last year, but now the pain is in her stomach.  I have sent her an rx to refill BID omeprazole and carafate QID.  She is asked to call me in 2 weeks with an update.  I stressed the importance of her remaining on a PPI long term at least once a day.  Dr. Carlean Purl please advise if she needs additional orders until she calls with an update

## 2013-11-23 NOTE — Telephone Encounter (Signed)
Agree with plans

## 2013-11-27 ENCOUNTER — Telehealth: Payer: Self-pay | Admitting: Internal Medicine

## 2013-11-27 NOTE — Telephone Encounter (Signed)
Patient will come in and see Nicoletta Ba PA tomorrow at 9:30

## 2013-11-28 ENCOUNTER — Other Ambulatory Visit (INDEPENDENT_AMBULATORY_CARE_PROVIDER_SITE_OTHER): Payer: Self-pay

## 2013-11-28 ENCOUNTER — Ambulatory Visit (INDEPENDENT_AMBULATORY_CARE_PROVIDER_SITE_OTHER)
Admission: RE | Admit: 2013-11-28 | Discharge: 2013-11-28 | Disposition: A | Discharge status: Home / Self Care | Payer: Self-pay | Source: Ambulatory Visit | Attending: Physician Assistant | Admitting: Physician Assistant

## 2013-11-28 ENCOUNTER — Ambulatory Visit (INDEPENDENT_AMBULATORY_CARE_PROVIDER_SITE_OTHER): Payer: Self-pay | Admitting: Physician Assistant

## 2013-11-28 ENCOUNTER — Encounter: Payer: Self-pay | Admitting: Physician Assistant

## 2013-11-28 VITALS — BP 130/80 | HR 68 | Ht 62.0 in | Wt 180.6 lb

## 2013-11-28 DIAGNOSIS — R1032 Left lower quadrant pain: Secondary | ICD-10-CM

## 2013-11-28 LAB — CBC WITH DIFFERENTIAL/PLATELET
BASOS PCT: 0.6 % (ref 0.0–3.0)
Basophils Absolute: 0 10*3/uL (ref 0.0–0.1)
Eosinophils Absolute: 0.1 10*3/uL (ref 0.0–0.7)
Eosinophils Relative: 1.4 % (ref 0.0–5.0)
HCT: 43.3 % (ref 36.0–46.0)
HEMOGLOBIN: 14.9 g/dL (ref 12.0–15.0)
LYMPHS PCT: 21.3 % (ref 12.0–46.0)
Lymphs Abs: 1.7 10*3/uL (ref 0.7–4.0)
MCHC: 34.5 g/dL (ref 30.0–36.0)
MCV: 83.5 fl (ref 78.0–100.0)
Monocytes Absolute: 0.5 10*3/uL (ref 0.1–1.0)
Monocytes Relative: 5.7 % (ref 3.0–12.0)
NEUTROS ABS: 5.6 10*3/uL (ref 1.4–7.7)
Neutrophils Relative %: 71 % (ref 43.0–77.0)
Platelets: 251 10*3/uL (ref 150.0–400.0)
RBC: 5.19 Mil/uL — AB (ref 3.87–5.11)
RDW: 14.3 % (ref 11.5–15.5)
WBC: 7.9 10*3/uL (ref 4.0–10.5)

## 2013-11-28 LAB — BASIC METABOLIC PANEL
BUN: 12 mg/dL (ref 6–23)
CHLORIDE: 102 meq/L (ref 96–112)
CO2: 30 mEq/L (ref 19–32)
CREATININE: 0.7 mg/dL (ref 0.4–1.2)
Calcium: 10 mg/dL (ref 8.4–10.5)
GFR: 89.33 mL/min (ref >60.00)
Glucose, Bld: 94 mg/dL (ref 70–99)
Potassium: 4.5 mEq/L (ref 3.5–5.1)
Sodium: 139 mEq/L (ref 135–145)

## 2013-11-28 MED ORDER — IOHEXOL 300 MG/ML  SOLN
100.0000 mL | Freq: Once | INTRAMUSCULAR | Status: AC | PRN
  Administered 2013-11-28: 100 mL via INTRAVENOUS

## 2013-11-28 NOTE — Progress Notes (Signed)
Subjective:    Patient ID: Katrina [name], female    DOB: Sep 11, 1955, 58 y.o.   MRN: [id number]  HPI  Katrina [name] is a pleasant 58 year old white female known to Dr. Carlean [name]. She has history of GERD and a Schatzki's ring. She had undergone EGD in July of 2014 when she was admitted with chest pain and dysphagia dysphagia and was felt to have a suspected is eosinophilic esophagitis however biopsies did not confirm this. She has remained on PPI therapy but admits that she had not been taking it every day. She started having symptoms about 3 weeks ago with some lower esophageal discomfort and pain. She started back on Zegerid. She then called the office last week because she was having persistent symptoms and was placed on twice daily omeprazole and Carafate liquid 1 g 4 times daily. She says she fairly immediately started feeling better and her epigastric and subxiphoid discomfort has resolved.  She says over this past weekend she had onset of left-sided abdominal pain which she has never had before she says her abdomen felt somewhat swollen and protuberant and the left-sided abdominal pain has been constant since. She describes it as being dull to sharp and has migrated some in her left abdomen. She feels tender to touch on the left side. She has not noted any fever or chills ,no nausea or vomiting, no changes in her bowel habits ,melena or hematochezia and has not been constipated. She does feel that eating may aggravate the pain but not immediately. She is status post hysterectomy, ovaries are in situ also had one laparoscopy done. She says she also has had some intermittent problems with urinary frequency but whenever she has a UA checked it's generally negative. She has not noted any urinary symptoms over the past 5 days.    Review of Systems  Constitutional: Negative.   HENT: Negative.   Eyes: Negative.   Respiratory: Negative.   Cardiovascular: Negative.   Gastrointestinal: Positive for abdominal  pain.  Endocrine: Negative.   Genitourinary: Positive for frequency.  Allergic/Immunologic: Negative.   Neurological: Negative.   Hematological: Negative.   Psychiatric/Behavioral: Negative.    Outpatient Prescriptions Prior to Visit  Medication Sig Dispense Refill  . levothyroxine (SYNTHROID, LEVOTHROID) 125 MCG tablet Take 125 mcg by mouth daily before breakfast.      . omeprazole (PRILOSEC) 40 MG capsule Take 1 capsule (40 mg total) by mouth 2 (two) times daily.  60 capsule  3  . sucralfate (CARAFATE) 1 GM/10ML suspension Take 10 mLs (1 g total) by mouth 4 (four) times daily.  280 mL  1  . omeprazole-sodium bicarbonate (ZEGERID) 40-1100 MG per capsule Take 1 capsule by mouth daily before breakfast.  14 capsule  0   No facility-administered medications prior to visit.       Allergies  Allergen Reactions  . Penicillins Rash   Patient Active Problem List   Diagnosis Date Noted  . Esophageal ring, acquired 11/10/2012  . Dysphagia, unspecified(787.20) 11/09/2012  . Chest pain 11/08/2012  . Hypothyroidism 11/08/2012  . 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 BACK PAIN 02/21/2008  . HYPOTHYROIDISM 12/28/2007  . GERD 12/28/2007  . 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 HEADACHE 12/28/2007   History  Substance Use Topics  . Smoking status: Never Smoker   . Smokeless tobacco: Never Used  . Alcohol Use: No   family history includes Coronary artery disease in her brother and father; Diabetes in her mother;  Stroke in her mother.  Objective:   Physical Exam   well-developed white female in no acute distress, blood pressure 130/80 pulse 68 height 5 foot 2 weight 180. HEENT; nontraumatic normocephalic EOMI PERRLA sclera anicteric, Supple no JVD, Cardiovascular; regular rate and rhythm with S1-S2 no murmur or gallop, Pulmonary; clear bilaterally, Abdomen ;soft bowel sounds are present no palpable hepatosplenomegaly she is tender in the left lower  quadrant,left mid quadrant there is no guarding or rebound no palpable mass or hepatosplenomegaly, Rectal; exam not done, Extremities; no clubbing cyanosis or edema skin warm dry, Psych; mood and affect appropriate.        Assessment & Plan:  #22  58 year old female with a five-day history of acute left lower quadrant abdominal pain etiology is not clear suspect she may have diverticulitis no has not had any previous previous history and no prior colonoscopy. Need to rule out other intra-abdominal inflammatory process #2 history of GERD, Schatzki's ring and recurrent esophagitis #3 hypothyroidism  Plan; CBC with differential and be met Schedule for CT scan of the abdomen and pelvis with contrast-further plans pending findings. We did discuss the need for colonoscopy however if she has diverticulitis or other inflammatory process this will need to be treated first. She will complete current course of Carafate. And resume omeprazole 40 mg daily

## 2013-11-28 NOTE — Patient Instructions (Addendum)
Please go to the basement level to have your labs drawn.    You have been scheduled for a CT scan of the abdomen and pelvis at [facility] ([address]---this is in the same building as [facility]).   You are scheduled Today 8-12 at 3:30 PM . You should arrive at 3:15 PM  prior to your appointment time for registration. Please follow the written instructions below on the day of your exam:  WARNING: IF YOU ARE ALLERGIC TO IODINE/X-RAY DYE, PLEASE NOTIFY RADIOLOGY IMMEDIATELY AT [phone]! YOU WILL BE GIVEN A 13 HOUR PREMEDICATION PREP.  1) Do not eat or drink anything after 11:30 am  (4 hours prior to your test) 2) You have been given 2 bottles of oral contrast to drink. The solution may taste  better if refrigerated, but do NOT add ice or any other liquid to this solution. Shake well before drinking.    Drink 1 bottle of contrast @ 1:30 PM  (2 hours prior to your exam)  Drink 1 bottle of contrast @ 2:30 PM  (1 hour prior to your exam)  You may take any medications as prescribed with a small amount of water except for the following: Metformin, Glucophage, Glucovance, Avandamet, Riomet, Fortamet, Actoplus Met, Janumet, Glumetza or Metaglip. The above medications must be held the day of the exam AND 48 hours after the exam.  The purpose of you drinking the oral contrast is to aid in the visualization of your intestinal tract. The contrast solution may cause some diarrhea. Before your exam is started, you will be given a small amount of fluid to drink. Depending on your individual set of symptoms, you may also receive an intravenous injection of x-ray contrast/dye. Plan on being at [facility] for 30 minutes or long, depending on the type of exam you are having performed.  If you have any questions regarding your exam or if you need to reschedule, you may call the CT department at [phone] between the hours of 8:00 am and 5:00 pm,  Monday-Friday.  ________________________________________________________________________

## 2013-11-29 ENCOUNTER — Other Ambulatory Visit: Payer: Self-pay | Admitting: *Deleted

## 2013-11-29 MED ORDER — CIPROFLOXACIN HCL 500 MG PO TABS: ORAL_TABLET | ORAL | Status: DC

## 2013-11-29 NOTE — Progress Notes (Signed)
Agree with Ms. Esterwood's assessment and plan. Xaivier Malay E. Cylas Falzone, MD, FACG   

## 2013-12-03 ENCOUNTER — Emergency Department (HOSPITAL_COMMUNITY)
Admission: EM | Admit: 2013-12-03 | Discharge: 2013-12-04 | Disposition: A | Discharge status: Home / Self Care | Payer: No Typology Code available for payment source | Attending: Emergency Medicine | Admitting: Emergency Medicine

## 2013-12-03 ENCOUNTER — Encounter (HOSPITAL_COMMUNITY): Payer: Self-pay | Admitting: Emergency Medicine

## 2013-12-03 DIAGNOSIS — J029 Acute pharyngitis, unspecified: Secondary | ICD-10-CM | POA: Insufficient documentation

## 2013-12-03 DIAGNOSIS — Z8719 Personal history of other diseases of the digestive system: Secondary | ICD-10-CM | POA: Insufficient documentation

## 2013-12-03 DIAGNOSIS — E039 Hypothyroidism, unspecified: Secondary | ICD-10-CM | POA: Insufficient documentation

## 2013-12-03 DIAGNOSIS — R6889 Other general symptoms and signs: Secondary | ICD-10-CM | POA: Insufficient documentation

## 2013-12-03 DIAGNOSIS — Z880 Allergy status to penicillin: Secondary | ICD-10-CM | POA: Insufficient documentation

## 2013-12-03 DIAGNOSIS — R0989 Other specified symptoms and signs involving the circulatory and respiratory systems: Secondary | ICD-10-CM

## 2013-12-03 DIAGNOSIS — Z792 Long term (current) use of antibiotics: Secondary | ICD-10-CM | POA: Insufficient documentation

## 2013-12-03 DIAGNOSIS — Z79899 Other long term (current) drug therapy: Secondary | ICD-10-CM | POA: Insufficient documentation

## 2013-12-03 NOTE — ED Provider Notes (Signed)
CSN: [id number]     Arrival date & time 12/03/13  1942 History   First MD Initiated Contact with Patient 12/03/13 2325     Chief Complaint  Patient presents with  . Foreign Body    in throat     (Consider location/radiation/quality/duration/timing/severity/associated sxs/prior Treatment) HPI Pt is a 58yo female with hx of hypothyroidism, GERD, and eosinophilic esophagitis presenting to ED with c/o having a fish bone stuck in her throat since 2pm this afternoon. Pt states she was initially able to feel the sharp tip of a fish bone with her finger in the back of her throat but was unable to grab it. States she can no longer feel the bone with her finger but states she feels like it is still stuck in her throat.  States she has tried to eat and drink after initial incident of getting the fish bone in her throat in hopes it would help get the bone down her throat but states it did not help.  Pt was able to keep down other food and fluids. Pain is throat is sore, 5/10, worse with swallowing.   Denies difficulty breathing.   Past Medical History  Diagnosis Date  . Thyroid disease   . Chest pain at rest 11/07/2012  . Hypothyroidism   . Shortness of breath 11/08/2012    RECENTLY  & WHEN i EXERCISE"  . GERD (gastroesophageal reflux disease)   . Eosinophilic esophagitis    Past Surgical History  Procedure Laterality Date  . Abdominal hysterectomy    . Esophagogastroduodenoscopy (egd) with esophageal dilation N/A 11/10/2012    Procedure: ESOPHAGOGASTRODUODENOSCOPY (EGD) WITH ESOPHAGEAL DILATION;  Surgeon: Gatha Mayer, MD;  Location: [place];  Service: Endoscopy;  Laterality: N/A;  [place] vs. [place]oon   Family History  Problem Relation Age of Onset  . Diabetes Mother   . Stroke Mother   . Coronary artery disease Father   . Coronary artery disease Brother    History  Substance Use Topics  . Smoking status: Never Smoker   . Smokeless tobacco: Never Used  . Alcohol Use: No   OB  History   Grav Para Term Preterm Abortions TAB SAB Ect Mult Living                 Review of Systems  HENT: Positive for sore throat. Negative for drooling, trouble swallowing and voice change.   Respiratory: Negative for cough and shortness of breath.   Cardiovascular: Negative for chest pain and palpitations.  All other systems reviewed and are negative.     Allergies  Penicillins  Home Medications   Prior to Admission medications   Medication Sig Start Date End Date Taking? Authorizing Provider  ciprofloxacin (CIPRO) 500 MG tablet Take 500 mg by mouth 2 (two) times daily. Started 12/01/13 for 7 days   Yes Historical Provider, MD  levothyroxine (SYNTHROID, LEVOTHROID) 125 MCG tablet Take 125 mcg by mouth daily before breakfast.   Yes Historical Provider, MD   BP 140/70  Pulse 74  Temp(Src) 98.9 F (37.2 C) (Oral)  Resp 16  Ht 5\' 2"  (1.575 m)  Wt 180 lb (81.647 kg)  BMI 32.91 kg/m2  SpO2 95% Physical Exam  Nursing note and vitals reviewed. Constitutional: She is oriented to person, place, and time. She appears well-developed and well-nourished.  Pt lying comfortably in exam bed, NAD.   HENT:  Head: Normocephalic and atraumatic.  Nose: Nose normal.  Mouth/Throat: Uvula is midline and mucous membranes are normal. No  trismus in the jaw. Posterior oropharyngeal erythema present. No oropharyngeal exudate, posterior oropharyngeal edema or tonsillar abscesses.  Erythema with focal area of irritation on right side of posterior pharynx. No foreign body visualized.  No airway obstruction.   Eyes: EOM are normal.  Neck: Normal range of motion. Neck supple. No JVD present. No tracheal deviation present. No thyromegaly present.  Neck-supple, no stridor. No tracheal deviation, thyromegaly, JVD or cervical adenopathy. Mild tenderness in right anterior neck.   Cardiovascular: Normal rate, regular rhythm and normal heart sounds.   Pulmonary/Chest: Effort normal and breath sounds normal.  No stridor. No respiratory distress. She has no wheezes. She has no rales. She exhibits no tenderness.  No respiratory distress, able to speak in full sentences w/o difficulty. Lungs: CTAB  Abdominal: Soft. She exhibits no distension. There is no tenderness.  Musculoskeletal: Normal range of motion.  Lymphadenopathy:    She has no cervical adenopathy.  Neurological: She is alert and oriented to person, place, and time.  Skin: Skin is warm and dry.  Psychiatric: She has a normal mood and affect. Her behavior is normal.    ED Course  Procedures (including critical care time) Labs Review Labs Reviewed - No data to display  Imaging Review Dg Neck Soft Tissue  12/04/2013   CLINICAL DATA:  Foreign body, fish bone in throat.  EXAM: NECK SOFT TISSUES - 1+ VIEW  COMPARISON:  None.  FINDINGS: There is no evidence of retropharyngeal soft tissue swelling or epiglottic enlargement. The cervical airway is unremarkable and no radio-opaque foreign body identified.  Moderate degenerative disc disease as evidenced by intervertebral disc space narrowing, endplate sclerosis, and osteophytosis seen at the C5-6 and C6-7 levels.  IMPRESSION: No radiopaque foreign body identified.   Electronically Signed   By: Jeannine Boga M.D.   On: 12/04/2013 00:31     EKG Interpretation None      MDM   Final diagnoses:  Sensation of foreign body in throat   Pt is a 58yo female c/o a fish bone being stuck in her throat. Denies difficulty breathing or swallowing. O2 95% on RA.  On exam, no respiratory distress, mild erythema and irritation in posterior pharynx. Possibly from reported fish bone or from pt using finger to attempt to dislodge fish bone. No foreign body seen on exam. No stridor.  Plain films: no radiopaque foreign body identified.  Discussed pt with Dr. Sharol Given, will discharge pt home to f/u with PCP and GI as needed. Return precautions provided. Pt verbalized understanding and agreement with tx  plan.    Noland Fordyce, PA-C 12/04/13 [phone]

## 2013-12-03 NOTE — ED Notes (Signed)
Pt reports fish bone stuck in throat since 2pm. Airway intact. Speaking in full sentences. Secretions controlled.

## 2013-12-04 ENCOUNTER — Emergency Department (HOSPITAL_COMMUNITY): Payer: No Typology Code available for payment source

## 2013-12-04 NOTE — Discharge Instructions (Signed)
Today you were evaluated for a foreign body sensation, a fish bone, being stuck in your throat.  Imaging did not see any evidence of a foreign body in area of irritation.  It is recommended you stick to a liquid diet and soft foods for next few days until sensation subsides.  If sensation is not improving, please follow up with your primary care provider or GI specialist for further evaluation.  Return to the ER immediately if you develop difficulty breathing, unable to keep down liquids, or other new concerning symptoms arise.

## 2013-12-04 NOTE — ED Provider Notes (Signed)
Medical screening examination/treatment/procedure(s) were performed by non-physician practitioner and as supervising physician I was immediately available for consultation/collaboration.   EKG Interpretation None       Kalman Drape, MD 12/04/13 [phone]

## 2014-01-08 ENCOUNTER — Encounter: Payer: No Typology Code available for payment source | Admitting: Internal Medicine

## 2014-01-22 ENCOUNTER — Telehealth: Payer: Self-pay | Admitting: Physician Assistant

## 2014-01-22 NOTE — Telephone Encounter (Signed)
Patient calling to ask about results of CT scan again. She wanted to be sure it did not say her heart was enlarged. Reviewed results with patient.

## 2014-03-11 ENCOUNTER — Encounter: Payer: No Typology Code available for payment source | Admitting: Internal Medicine

## 2014-04-30 ENCOUNTER — Telehealth: Payer: Self-pay | Admitting: Internal Medicine

## 2014-04-30 MED ORDER — SUCRALFATE 1 GM/10ML PO SUSP: 1.0000 g | Freq: Four times a day (QID) | ORAL | Status: DC

## 2014-04-30 NOTE — Telephone Encounter (Signed)
Refill x 3 is ok

## 2014-04-30 NOTE — Telephone Encounter (Signed)
Spoke with patient and she said she had to take a z-pak last week for an eye infection and it flared things up and that's why she is requesting the carafate suspension.  She reports feeling full and epigastric pain.

## 2014-04-30 NOTE — Telephone Encounter (Signed)
Notified patient that rx sent in and to let us know if she doesn't get better.

## 2014-06-24 ENCOUNTER — Telehealth: Payer: Self-pay | Admitting: Internal Medicine

## 2014-06-24 NOTE — Telephone Encounter (Signed)
I attempted to return the call, voicemail has not been set up. I will continue to try and reach the patient

## 2014-06-24 NOTE — Telephone Encounter (Signed)
I returned the call to the patient.  She reports she had one episode of what looked like "red wine had been poured in the toilet".  She is not sure where the bleeding came from.  She states that she had no evidence on the tissue after wiping her rectum or vaginally.  She states it was very dark in color, not bright red.  She denies any additional bleeding.  She has not had another BM this am, but has urinated several times with no blood.  She was at work when this happened and now questions if there was something in the commode she did not notice.  She denies any other symptoms at all.  She is advised that if she has any more bleeding she will need to have an evaluation.  She is asked to call back if she has any rectal bleeding or if there is a large amount of bleeding will need to be seen in the ER.  She verbalized understanding.

## 2014-06-24 NOTE — Telephone Encounter (Signed)
Agree with your recommendations.  Additional recommendation is that I recommend she schedule a screening colonoscopy regardless of what happens She has never had a colonoscopy

## 2014-06-25 NOTE — Telephone Encounter (Signed)
Patient notified.  She is scheduled for [facility] for 07/29/14 and pre-visit on 07/17/14

## 2014-07-17 ENCOUNTER — Telehealth: Payer: Self-pay | Admitting: Internal Medicine

## 2014-07-17 NOTE — Telephone Encounter (Signed)
Patient notified of recommendations.  She is scheduled for 09/09/14 9:45 to discuss

## 2014-07-17 NOTE — Telephone Encounter (Signed)
Patient advised of cologuard and virtual colonoscopy.  I did explain the limitations of both and the possible need for a colonoscopy based on the results.  She would like to try cologuard.  Dr. Carlean Purl is it ok to set up for her?

## 2014-07-17 NOTE — Telephone Encounter (Signed)
There has been a ? Of bleeding per my chart review (phone note) so I am not willing to go along with that recommendation at this point. I would like her to schedule an office visit to discuss this and we will work it out.

## 2014-07-29 ENCOUNTER — Encounter: Payer: Self-pay | Admitting: Internal Medicine

## 2014-09-09 ENCOUNTER — Ambulatory Visit: Payer: Self-pay | Admitting: Internal Medicine

## 2014-09-19 ENCOUNTER — Encounter: Payer: Self-pay | Admitting: Internal Medicine

## 2014-09-19 NOTE — Progress Notes (Unsigned)
Patient ID: Katrina [name], female   DOB: 22-Oct-1955, 59 y.o.   MRN: [id number] The patient's chart has been reviewed by Dr. Carlean Purl  and the recommendations are noted below.    Follow-up advised. Schedule patient for next available appointment. Outcome of communication with the patient:  Letter mailed

## 2014-12-02 ENCOUNTER — Encounter: Payer: Self-pay | Admitting: Internal Medicine

## 2014-12-02 ENCOUNTER — Telehealth: Payer: Self-pay | Admitting: Internal Medicine

## 2014-12-02 ENCOUNTER — Ambulatory Visit (INDEPENDENT_AMBULATORY_CARE_PROVIDER_SITE_OTHER): Payer: Self-pay | Admitting: Internal Medicine

## 2014-12-02 VITALS — BP 122/82 | HR 72 | Ht 62.0 in | Wt 186.3 lb

## 2014-12-02 DIAGNOSIS — Z1211 Encounter for screening for malignant neoplasm of colon: Secondary | ICD-10-CM

## 2014-12-02 DIAGNOSIS — K219 Gastro-esophageal reflux disease without esophagitis: Secondary | ICD-10-CM

## 2014-12-02 DIAGNOSIS — R0789 Other chest pain: Secondary | ICD-10-CM

## 2014-12-02 DIAGNOSIS — R1310 Dysphagia, unspecified: Secondary | ICD-10-CM

## 2014-12-02 DIAGNOSIS — R1319 Other dysphagia: Secondary | ICD-10-CM

## 2014-12-02 DIAGNOSIS — R1314 Dysphagia, pharyngoesophageal phase: Secondary | ICD-10-CM

## 2014-12-02 MED ORDER — LANSOPRAZOLE 15 MG PO CPDR: 30.0000 mg | DELAYED_RELEASE_CAPSULE | Freq: Every day | ORAL | Status: DC

## 2014-12-02 MED ORDER — OMEPRAZOLE-SODIUM BICARBONATE 40-1100 MG PO CAPS: 1.0000 | ORAL_CAPSULE | Freq: Every day | ORAL | Status: DC

## 2014-12-02 NOTE — Patient Instructions (Signed)
Try the following for several weeks and let us know how your doing by phone or MyChart:    Lansoprazole 15mg  over the counter tablets, take 2 every AM 46mins. before breakfast and at bedtime take one zegerid over the counter medicine as well.  .Your physician has requested that you go to the basement for the following lab work before leaving today: IFOB  Try Salonpas for your chest wall pain.  Follow up with Dr. Carlean Purl in 2 months.    I appreciate the opportunity to care for you. Silvano Rusk, MD, [facility]

## 2014-12-02 NOTE — Telephone Encounter (Signed)
Pt states she is having "non-cardiac" chest pain. States it is the same pain that she was seen in the ER. States the pain in in between her shoulder blades. Pt scheduled to see Dr. Carlean Purl today at 9:45am. Pt aware of appt.

## 2014-12-02 NOTE — Progress Notes (Signed)
   Subjective:    Patient ID: Katrina [name], female    DOB: [birth year], 59 y.o.   MRN: [id number] Cc: dyaphagia, chest and back pain HPI The patient has a history of an esophageal ring and reflux. She had stopped taking her omeprazole because she felt it bloated her. She now uses Tagamet which doesn't seem to cause any side effects. However there is a chronic history of intermittent solid food dysphagia to bread and meats so she avoids those usually, and she has a lot of nocturnal heartburn and indigestion. She does not drink significant amounts of caffeine, she does not smoke, she avoids eating and going to bed close to the time of eating, she says the head of her bed is elevated. She was taking omeprazole as mentioned but stopped it over a year ago.  3 days ago she was using manual hedge clippers to trim hedges. That night she developed chest wall and back pain or at least that afternoon. She says it is similar to pain like she had before. She actually developed interscapular pain and then felt it in her chest. She is better but this persists and it's worse with movement. She started walking within the past 2-3 weeks and is been somewhat dyspneic with that but does not get any chest pain whatsoever. She's had a cardiac workup in 2014 for chest pain.  She is uninsured, she lives on [address] and says she cannot afford health insurance.  About 6 months ago or so she had a single isolated episode of rectal bleeding that is not recurred. She was using Atkins diet and felt like she was somewhat constipated at the time.  Medications, allergies, past medical history, past surgical history, family history and social history are reviewed and updated in the EMR.  Review of Systems As above    Objective:   Physical Exam @BP  122/82 mmHg  Pulse 72  Ht 5\' 2"  (1.575 m)  Wt 186 lb 5 oz (84.511 kg)  BMI 34.07 kg/m2@  General:  Well-developed, well-nourished and in no acute  distress Eyes:  anicteric. Lungs: Clear to auscultation bilaterally. Chest wall: Tender over the sternum and xiphoid area Heart:  S1S2, no rubs, murmurs, gallops. Abdomen:  Obese soft, non-tender, no hepatosplenomegaly, hernia, or mass and BS+.  Rectal: [place], Precious Bard present for exam. Normal anoderm. Brown heme-negative stool no mass no obvious hemorrhoids. Neuro:  A&O x 3.  Psych:  appropriate mood and  Affect.   Data Reviewed: Prior endoscopy    Assessment & Plan:   1. Gastroesophageal reflux disease, esophagitis presence not specified   2. Esophageal dysphagia   3. Colon cancer screening   4. Chest wall pain    I think she has acute chest wall pain related to the hedge trimming activity 3 days ago, with a background of GERD history of esophageal ring and recurrent dysphagia. Her GERD is inadequately treated on H2 blocker. She has symptoms at night of reflux, I think trying Zegerid over-the-counter at bedtime make sense though it is omeprazole so if the bloating returns she may need to stop that we may need to try lansoprazole.  Over-the-counter anti-inflammatory like ibuprofen or Aleve are okay to use temporarily for her musculoskeletal pain, I've also recommended a topical ointment called [place] PAS  I FOBT for colon cancer screening.

## 2014-12-26 ENCOUNTER — Emergency Department (HOSPITAL_COMMUNITY): Payer: Self-pay

## 2014-12-26 ENCOUNTER — Inpatient Hospital Stay (HOSPITAL_COMMUNITY): Payer: Self-pay

## 2014-12-26 ENCOUNTER — Encounter (HOSPITAL_COMMUNITY): Payer: Self-pay | Admitting: Emergency Medicine

## 2014-12-26 ENCOUNTER — Inpatient Hospital Stay (HOSPITAL_COMMUNITY)
Admission: EM | Admit: 2014-12-26 | Discharge: 2014-12-27 | DRG: 816 | Disposition: A | Discharge status: Home / Self Care | Payer: Self-pay | Attending: Oncology | Admitting: Oncology

## 2014-12-26 DIAGNOSIS — Z79899 Other long term (current) drug therapy: Secondary | ICD-10-CM

## 2014-12-26 DIAGNOSIS — K219 Gastro-esophageal reflux disease without esophagitis: Secondary | ICD-10-CM | POA: Diagnosis present

## 2014-12-26 DIAGNOSIS — R591 Generalized enlarged lymph nodes: Secondary | ICD-10-CM

## 2014-12-26 DIAGNOSIS — D860 Sarcoidosis of lung: Secondary | ICD-10-CM | POA: Insufficient documentation

## 2014-12-26 DIAGNOSIS — Z888 Allergy status to other drugs, medicaments and biological substances status: Secondary | ICD-10-CM

## 2014-12-26 DIAGNOSIS — R490 Dysphonia: Secondary | ICD-10-CM | POA: Diagnosis present

## 2014-12-26 DIAGNOSIS — R918 Other nonspecific abnormal finding of lung field: Secondary | ICD-10-CM | POA: Diagnosis present

## 2014-12-26 DIAGNOSIS — E039 Hypothyroidism, unspecified: Secondary | ICD-10-CM | POA: Diagnosis present

## 2014-12-26 DIAGNOSIS — R590 Localized enlarged lymph nodes: Principal | ICD-10-CM | POA: Diagnosis present

## 2014-12-26 DIAGNOSIS — R079 Chest pain, unspecified: Secondary | ICD-10-CM | POA: Diagnosis present

## 2014-12-26 DIAGNOSIS — Z880 Allergy status to penicillin: Secondary | ICD-10-CM

## 2014-12-26 DIAGNOSIS — R0602 Shortness of breath: Secondary | ICD-10-CM

## 2014-12-26 DIAGNOSIS — Z90711 Acquired absence of uterus with remaining cervical stump: Secondary | ICD-10-CM | POA: Diagnosis present

## 2014-12-26 DIAGNOSIS — R0600 Dyspnea, unspecified: Secondary | ICD-10-CM | POA: Diagnosis present

## 2014-12-26 DIAGNOSIS — R1310 Dysphagia, unspecified: Secondary | ICD-10-CM | POA: Diagnosis present

## 2014-12-26 LAB — URINALYSIS, ROUTINE W REFLEX MICROSCOPIC
BILIRUBIN URINE: NEGATIVE
GLUCOSE, UA: NEGATIVE mg/dL
HGB URINE DIPSTICK: NEGATIVE
KETONES UR: NEGATIVE mg/dL
LEUKOCYTES UA: NEGATIVE
Nitrite: NEGATIVE
PROTEIN: NEGATIVE mg/dL
Specific Gravity, Urine: 1.013 (ref 1.005–1.030)
Urobilinogen, UA: 0.2 mg/dL (ref 0.0–1.0)
pH: 6 (ref 5.0–8.0)

## 2014-12-26 LAB — TSH: TSH: 2.001 u[IU]/mL (ref 0.350–4.500)

## 2014-12-26 LAB — TECHNOLOGIST SMEAR REVIEW

## 2014-12-26 LAB — URIC ACID: Uric Acid, Serum: 5.8 mg/dL (ref 2.3–6.6)

## 2014-12-26 LAB — APTT: aPTT: 33 seconds (ref 24–37)

## 2014-12-26 LAB — HEPATIC FUNCTION PANEL
ALBUMIN: 3.9 g/dL (ref 3.5–5.0)
ALT: 30 U/L (ref 14–54)
AST: 25 U/L (ref 15–41)
Alkaline Phosphatase: 77 U/L (ref 38–126)
BILIRUBIN DIRECT: 0.2 mg/dL (ref 0.1–0.5)
Indirect Bilirubin: 0.6 mg/dL (ref 0.3–0.9)
Total Bilirubin: 0.8 mg/dL (ref 0.3–1.2)
Total Protein: 7.6 g/dL (ref 6.5–8.1)

## 2014-12-26 LAB — PROTIME-INR
INR: 1.07 (ref 0.00–1.49)
Prothrombin Time: 14.1 seconds (ref 11.6–15.2)

## 2014-12-26 LAB — CBC
HCT: 43.6 % (ref 36.0–46.0)
HEMOGLOBIN: 14.6 g/dL (ref 12.0–15.0)
MCH: 28.7 pg (ref 26.0–34.0)
MCHC: 33.5 g/dL (ref 30.0–36.0)
MCV: 85.7 fL (ref 78.0–100.0)
Platelets: 249 10*3/uL (ref 150–400)
RBC: 5.09 MIL/uL (ref 3.87–5.11)
RDW: 13.4 % (ref 11.5–15.5)
WBC: 6.2 10*3/uL (ref 4.0–10.5)

## 2014-12-26 LAB — BASIC METABOLIC PANEL
ANION GAP: 8 (ref 5–15)
BUN: 14 mg/dL (ref 6–20)
CHLORIDE: 106 mmol/L (ref 101–111)
CO2: 25 mmol/L (ref 22–32)
Calcium: 9.7 mg/dL (ref 8.9–10.3)
Creatinine, Ser: 0.86 mg/dL (ref 0.44–1.00)
GFR calc non Af Amer: >60 mL/min (ref >60)
Glucose, Bld: 107 mg/dL — ABNORMAL HIGH (ref 65–99)
Potassium: 4.1 mmol/L (ref 3.5–5.1)
SODIUM: 139 mmol/L (ref 135–145)

## 2014-12-26 LAB — I-STAT TROPONIN, ED: TROPONIN I, POC: 0 ng/mL (ref 0.00–0.08)

## 2014-12-26 LAB — PHOSPHORUS: Phosphorus: 3.2 mg/dL (ref 2.5–4.6)

## 2014-12-26 LAB — LACTATE DEHYDROGENASE: LDH: 181 U/L (ref 98–192)

## 2014-12-26 LAB — MAGNESIUM: MAGNESIUM: 1.9 mg/dL (ref 1.7–2.4)

## 2014-12-26 LAB — PREALBUMIN: PREALBUMIN: 25.6 mg/dL (ref 18–38)

## 2014-12-26 MED ORDER — FAMOTIDINE 20 MG PO TABS
20.0000 mg | ORAL_TABLET | Freq: Every day | ORAL | Status: DC
  Administered 2014-12-27: 20 mg via ORAL
  Filled 2014-12-26: qty 1

## 2014-12-26 MED ORDER — ENOXAPARIN SODIUM 40 MG/0.4ML ~~LOC~~ SOLN
40.0000 mg | SUBCUTANEOUS | Status: DC
  Administered 2014-12-26: 40 mg via SUBCUTANEOUS
  Filled 2014-12-26: qty 0.4

## 2014-12-26 MED ORDER — IOHEXOL 300 MG/ML  SOLN
25.0000 mL | Freq: Once | INTRAMUSCULAR | Status: AC | PRN
  Administered 2014-12-26: 25 mL via ORAL

## 2014-12-26 MED ORDER — LEVOTHYROXINE SODIUM 125 MCG PO TABS
125.0000 ug | ORAL_TABLET | Freq: Every day | ORAL | Status: DC
  Administered 2014-12-27: 125 ug via ORAL
  Filled 2014-12-26 (×4): qty 1

## 2014-12-26 MED ORDER — ONDANSETRON HCL 4 MG PO TABS: 4.0000 mg | ORAL_TABLET | Freq: Four times a day (QID) | ORAL | Status: DC | PRN

## 2014-12-26 MED ORDER — IOHEXOL 300 MG/ML  SOLN
75.0000 mL | Freq: Once | INTRAMUSCULAR | Status: AC | PRN
  Administered 2014-12-26: 75 mL via INTRAVENOUS

## 2014-12-26 MED ORDER — IOHEXOL 350 MG/ML SOLN: 80.0000 mL | Freq: Once | INTRAVENOUS | Status: DC | PRN

## 2014-12-26 MED ORDER — SODIUM CHLORIDE 0.9 % IV SOLN
INTRAVENOUS | Status: AC
  Administered 2014-12-26: 20:00:00 via INTRAVENOUS

## 2014-12-26 MED ORDER — ONDANSETRON HCL 4 MG/2ML IJ SOLN: 4.0000 mg | Freq: Four times a day (QID) | INTRAMUSCULAR | Status: DC | PRN

## 2014-12-26 MED ORDER — ACETAMINOPHEN 650 MG RE SUPP: 650.0000 mg | Freq: Four times a day (QID) | RECTAL | Status: DC | PRN

## 2014-12-26 MED ORDER — ACETAMINOPHEN 325 MG PO TABS
650.0000 mg | ORAL_TABLET | Freq: Four times a day (QID) | ORAL | Status: DC | PRN
  Administered 2014-12-26: 650 mg via ORAL
  Filled 2014-12-26: qty 2

## 2014-12-26 MED ORDER — IOHEXOL 300 MG/ML  SOLN
80.0000 mL | Freq: Once | INTRAMUSCULAR | Status: AC | PRN
  Administered 2014-12-26: 75 mL via INTRAVENOUS

## 2014-12-26 MED ORDER — SENNOSIDES-DOCUSATE SODIUM 8.6-50 MG PO TABS: 1.0000 | ORAL_TABLET | Freq: Every evening | ORAL | Status: DC | PRN

## 2014-12-26 NOTE — ED Notes (Signed)
Attempted report 

## 2014-12-26 NOTE — H&P (Signed)
Date: 12/26/2014               Patient Name:  Katrina [name] MRN: [id number]  DOB: 1955/11/13 Age / Sex: 59 y.o., female   PCP: No Pcp Per Patient         Medical Service: Internal Medicine Teaching Service         Attending Physician: Dr. Annia Belt, MD    First Contact: Dr. Marlowe Sax Pager: [phone]  Second Contact: Dr. Naaman Plummer Pager: [phone]       After Hours (After 5p/  First Contact Pager: [phone]  weekends / holidays): Second Contact Pager: [phone]   Chief Complaint: SOB and chest pain  History of Present Illness: Patient is a 59 yo F with a PMHx of hypothyroidism, GERD, eosinophilic esophagitis presenting to [facility] with progressive SOB and chest pain for the past few weeks. States the pain in her chest is epigastric and substernal. Denies having any cough or sputum production. Patient has a history of acid reflux and currently taking Cimetidine. She was diagnosed with eosinophilic esophagitis in 1937 and biopsy at the time showed inflammation and presence of Schatzki ring. Patient is also complaining of dysphagia to both solids and liquids and hoarseness of voice. States she has noticed a mass on the right side of her neck for the past 1 year, which is painful sometimes. Also complaining of sore throat associated with right ear pain. States she went to see her dentist 3 weeks ago and they checked her for TMJ but she was not told anything about the neck mass. No history of smoking, alcohol, or drug use. Never used any chewing tobacco. States she has been feeling weak and tired for several months. Denies having any fevers or night sweats. Denies any loss of appetite or weight loss. States she noticed blood in her stool back in 06/2014 and was told by GI to get a colonoscopy done. However, due to lack of insurance she never got it done. Patient had a partial hysterectomy in 2002 due to history of fibroids. States her mother had blood cancer (myelodysplastic syndrome?) and  died at the age of 34. States she was last sexually active 1 year ago, female partner. As per patient, she was this partner for 8 years and he had sexual relations with other people. During physical exam, it was noted that the patient had a wart on the plantar surface of her foot.   Meds: Current Facility-Administered Medications  Medication Dose Route Frequency Provider Last Rate Last Dose  . 0.9 %  sodium chloride infusion   Intravenous Continuous Marjan Rabbani, MD      . acetaminophen (TYLENOL) tablet 650 mg  650 mg Oral Q6H PRN Juluis Mire, MD       Or  . acetaminophen (TYLENOL) suppository 650 mg  650 mg Rectal Q6H PRN Marjan Rabbani, MD      . enoxaparin (LOVENOX) injection 40 mg  40 mg Subcutaneous Q24H Marjan Rabbani, MD      . famotidine (PEPCID) tablet 20 mg  20 mg Oral Daily Marjan Rabbani, MD      . Derrill Memo ON 12/27/2014] levothyroxine (SYNTHROID, LEVOTHROID) tablet 125 mcg  125 mcg Oral QAC breakfast Marjan Rabbani, MD      . ondansetron (ZOFRAN) tablet 4 mg  4 mg Oral Q6H PRN Marjan Rabbani, MD       Or  . ondansetron (ZOFRAN) injection 4 mg  4 mg Intravenous Q6H PRN Juluis Mire, MD      .  senna-docusate (Senokot-S) tablet 1 tablet  1 tablet Oral QHS PRN Juluis Mire, MD        Allergies: Allergies as of 12/26/2014 - Review Complete 12/26/2014  Allergen Reaction Noted  . Omeprazole Anaphylaxis 12/26/2014  . Penicillins Shortness Of Breath and Rash 12/28/2007   Past Medical History  Diagnosis Date  . Thyroid disease   . Chest pain at rest 11/07/2012  . Hypothyroidism   . Shortness of breath 11/08/2012    RECENTLY  & WHEN i EXERCISE"  . GERD (gastroesophageal reflux disease)   . Eosinophilic esophagitis    Past Surgical History  Procedure Laterality Date  . Abdominal hysterectomy    . Esophagogastroduodenoscopy (egd) with esophageal dilation N/A 11/10/2012    Procedure: ESOPHAGOGASTRODUODENOSCOPY (EGD) WITH ESOPHAGEAL DILATION;  Surgeon: Gatha Mayer, MD;   Location: [place];  Service: Endoscopy;  Laterality: N/A;  [place] vs. [place]oon   Family History  Problem Relation Age of Onset  . Diabetes Mother   . Stroke Mother   . Coronary artery disease Father   . Coronary artery disease Brother    Social History   Social History  . Marital Status: Divorced    Spouse Name: N/A  . Number of Children: N/A  . Years of Education: N/A   Occupational History  . Not on file.   Social History Main Topics  . Smoking status: Never Smoker   . Smokeless tobacco: Never Used  . Alcohol Use: No  . Drug Use: No  . Sexual Activity: Not on file   Other Topics Concern  . Not on file   Social History Narrative    Review of Systems: Review of Systems  Constitutional: Positive for malaise/fatigue. Negative for fever and chills.  HENT: Positive for ear pain and sore throat.        Neck mass  Occasional headaches   Eyes: Negative for blurred vision, double vision and pain.  Respiratory: Positive for shortness of breath. Negative for cough, sputum production and wheezing.   Cardiovascular: Positive for chest pain. Negative for palpitations and leg swelling.  Gastrointestinal: Positive for heartburn. Negative for nausea, vomiting, abdominal pain, diarrhea and constipation.       Dysphagia Hoarseness of voice   Genitourinary: Negative for dysuria, urgency, frequency and hematuria.  Skin: Negative for itching and rash.       wart  Neurological: Negative for dizziness, sensory change and focal weakness.    Physical Exam: Blood pressure 125/68, pulse 65, temperature 99.1 F (37.3 C), resp. rate 13, height 5\' 2"  (1.575 m), weight 185 lb (83.915 kg), SpO2 92 %. Physical Exam  Constitutional: She is oriented to person, place, and time. She appears well-developed and well-nourished. No distress.  HENT:  Head: Normocephalic and atraumatic.  Mouth/Throat: No oropharyngeal exudate.  Eyes: EOM are normal. Pupils are equal, round, and reactive to  light.  Neck: Neck supple. No tracheal deviation present.  Cardiovascular: Normal rate, regular rhythm and intact distal pulses.  Exam reveals no gallop and no friction rub.   No murmur heard. Pulmonary/Chest: Effort normal. No respiratory distress. She has no wheezes. She has no rales.  Breast exam: no masses palpated, no nipple discharge, no skin changes noted. No axillary lymphadenopathy appreciated.   Abdominal: Soft. Bowel sounds are normal. She exhibits no distension. There is no tenderness.  Musculoskeletal: Normal range of motion. She exhibits no edema or tenderness.  Lymphadenopathy:    She has no cervical adenopathy.  Neurological: She is alert and oriented to person,  place, and time. No cranial nerve deficit.  Skin: Skin is warm and dry.  Wart on plantar surface of right foot.     Lab results: Basic Metabolic Panel:  Recent Labs  12/26/14 1134 12/26/14 1726  NA 139  --   K 4.1  --   CL 106  --   CO2 25  --   GLUCOSE 107*  --   BUN 14  --   CREATININE 0.86  --   CALCIUM 9.7  --   MG  --  1.9  PHOS  --  3.2   CBC:  Recent Labs  12/26/14 1134  WBC 6.2  HGB 14.6  HCT 43.6  MCV 85.7  PLT 249    Imaging results:  Dg Chest 2 View  12/26/2014   CLINICAL DATA:  Mid chest pain and shortness of breath.  EXAM: CHEST  2 VIEW  COMPARISON:  Chest x-rays dated 11/08/2012 and 02/22/2011  FINDINGS: There is new cardiomegaly since the prior exams. The patient also has right hilar adenopathy with a possible mass at the inferior aspect of the right hilum. There is fullness in the azygos region. Density overlying the lingula on the PA view is most likely due to a cardiac lead electrode.  No effusions.  No osseous abnormality.  IMPRESSION: New right hilar and mediastinal adenopathy. Probable mass at the inferior aspect of the right hilum. Findings are worrisome for malignancy.   Electronically Signed   By: Lorriane Shire M.D.   On: 12/26/2014 12:43   Ct Soft Tissue Neck W  Contrast  12/26/2014   CLINICAL DATA:  Mediastinal and hilar lymphadenopathy.  EXAM: CT NECK WITH CONTRAST  TECHNIQUE: Multidetector CT imaging of the neck was performed using the standard protocol following the bolus administration of intravenous contrast.  CONTRAST:  67mL OMNIPAQUE IOHEXOL 300 MG/ML  SOLN  COMPARISON:  None.  FINDINGS: Pharynx and larynx: Normal.  Salivary glands: Normal.  Thyroid: Atrophic.  No nodules.  Lymph nodes: Normal.  No adenopathy in the neck.  Vascular: Normal.  Limited intracranial: Normal.  Visualized orbits: Normal.  Mastoids and visualized paranasal sinuses: Normal.  Skeleton: Degenerative disc disease at C5-6 and C6-7. Otherwise normal.  Upper chest: 3.6 mm nodule in the posterior aspect of the right upper lobe on image 106 of series 201. This is visible on the prior chest CT.  IMPRESSION: Negative CT scan of the neck.  No pathologic adenopathy.   Electronically Signed   By: Lorriane Shire M.D.   On: 12/26/2014 19:08   Ct Chest W Contrast  12/26/2014   CLINICAL DATA:  New right hilar and mediastinal adenopathy seen on chest x-ray. Shortness of breath for 2 months.  EXAM: CT CHEST WITH CONTRAST  TECHNIQUE: Multidetector CT imaging of the chest was performed during intravenous contrast administration.  CONTRAST:  87mL OMNIPAQUE IOHEXOL 300 MG/ML  SOLN  COMPARISON:  Chest x-ray dated 12/26/2014  FINDINGS: There is no evidence of central pulmonary embolus. The heart and great vessels are normal in appearance.  There is a mediastinal and bilateral hilar lymphadenopathy. Enlarged pretracheal an perivascular lymph nodes are seen within the mediastinum. The largest conglomerate of lymph nodes is seen in the right hilum and measures 3.3 x 2.2 cm. There is no evidence of significant focal lung parenchymal consolidation, pleural effusion or pneumothorax. There are however numerous bilateral subpleural and paraseptal pulmonary nodules. The index pulmonary nodule is located within the  superior segment of left lower lobe and measures 1.2 x  0.7 by 0.7 cm. Numerous other subpleural soft tissue and ground-glass few mm nodules scattered throughout both lungs.  There is a prominent lymph node within the anterior mediastinal pericardial fat. No axillary lymphadenopathy is seen. The visualized portions of the thyroid gland are unremarkable in appearance.  The visualized portions of the liver and spleen are unremarkable.  The visualized portions of the pancreas, gallbladder, stomach, adrenal glands and kidneys are within normal limits.  There is a 3 mm sclerotic focus within the posterior aspect of T4 vertebral body.  IMPRESSION: Mediastinal and bilateral hilar lymphadenopathy.  1.2 cm soft tissue mass within superior segment of left lower lobe. Numerous other bilateral mostly subpleural soft tissue and ground-glass few mm pulmonary nodules.  3 mm sclerotic focus within T4 vertebral body.  Overall appearance is suspicious for metastatic disease, or less likely primary lung malignancy. Alternatively, pulmonary sarcoidosis may have a similar appearance.  Evaluation with PET-CT and/or tissue diagnosis may be considered.   Electronically Signed   By: Fidela Salisbury M.D.   On: 12/26/2014 15:12   Ct Abdomen Pelvis W Contrast  12/26/2014   CLINICAL DATA:  59 year old female with mediastinal adenopathy seen on the chest CT performed earlier the liver, gallbladder, pancreas, spleen, adrenal glands, kidneys, visualized ureters, and urinary bladder appear unremarkable. Hysterectomy.  Today.  EXAM: CT ABDOMEN AND PELVIS WITH CONTRAST  TECHNIQUE: Multidetector CT imaging of the abdomen and pelvis was performed using the standard protocol following bolus administration of intravenous contrast.  CONTRAST:  23mL OMNIPAQUE IOHEXOL 300 MG/ML  SOLN  COMPARISON:  CT dated 11/28/2013  FINDINGS: No intra-abdominal free air or free fluid.  The liver, gallbladder, pancreas, spleen, adrenal glands, kidneys, visualized  ureters, and urinary bladder appear unremarkable. Hysterectomy.  There is irregular and thickened appearance of the wall of the cecum and proximal colon. Although this may be related to presence of residual fecal material the cecal mass is not excluded. Colonoscopy is recommended for better evaluation. There is no evidence of bowel obstruction. The appendix is unremarkable.  The abdominal aorta and IVC are patent. A mildly enlarged lymph nodes noted in the region of porta hepatis. Top-normal peripancreatic and portacaval lymph nodes noted, nonspecific.  Midline vertical anterior pelvic wall incisional scar. There is mild degenerative changes of the spine with no acute fracture.  IMPRESSION: Apparent irregularity and thickening of the cecum and ascending colon possibly related to residual fecal content. A cecal mass is not excluded. Colonoscopy recommended for better evaluation.  Mildly prominent lymph nodes in the porta hepaticus, nonspecific.  No acute intra-abdominal or pelvic pathology identified.   Electronically Signed   By: Anner Crete M.D.   On: 12/26/2014 19:12    Other results: EKG: Normal sinus rhythm. No ST or T wave changes noted.  Assessment & Plan by Problem: Active Problems:   Mediastinal lymphadenopathy  Mediastinal Lymphadenopathy Patient presented with chest pain and SOB. CT showed Mediastinal and bilateral hilar lymphadenopathy.  1.2 cm soft tissue mass within superior segment of left lower lobe. Numerous other bilateral mostly subpleural soft tissue and ground-glass few mm pulmonary nodules.  3 mm sclerotic focus within T4 vertebral body. Overall appearance is suspicious for metastatic disease, or less likely primary lung malignancy. Findings could be due to metastatic head and neck cancer. Patient complaining of dysphagia to both solids and liquids, hoarseness of voice, and malaise/ fatigue. She does not smoke, chew tobacco, or use alcohol. HPV could put her at risk for possible  head and neck cancer. CT  of neck normal, no pathologic adenopathy. Lymphoma is also on the differential because pt has a family history of blood cancer. Esophageal cancer is also on the differential because pt has a history of GERD and currently has dysphagia to both solids and liquids. However, EGD in 2014 revealed presence of eosinophilic esophagitis and schatzki ring. CT abdomen and pelvis showing irregularity and thickening of cecum and ascending colon related to residual fecal content, a cecal mass cannot be excluded. Colonoscopy recommended for better evaluation. Mildly prominent lymph nodes in the porta hepaticus. LDH and Uric acid normal. LFTs normal. INR 1.07, aPTT 33. Mag and Phos normal. Prealbumin normal.  -Admitted to med-surg -Tylenol 650 mg prn  -Zofran prnt -F/u AM BMP -F/u HIV antibody  -F/u Hep C antibody  -F/u blood smear   GERD -Continue Cimetidine 200 mg qd  -Pepcid 20 mg qd  Hypothyroidism TSH normal.  -continue Synthroid 125 mcg qd  Diet: regular   DVT ppx: Lovenox  Code: FULL  Dispo: Disposition is deferred at this time, awaiting improvement of current medical problems. Anticipated discharge in approximately 1-2 day(s).   The patient does have a current PCP (No Pcp Per Patient) and does need an [facility] hospital follow-up appointment after discharge.  The patient does not have transportation limitations that hinder transportation to clinic appointments.  Signed: Shela Leff, MD 12/26/2014, 7:38 PM

## 2014-12-26 NOTE — ED Notes (Signed)
Patient states she has been having chest pain and shortness of breath for months.   Patient states that she was having worsening chest pain and shortness of breath last night.   Patient states chest pain is resolved at this time.  Denies other symptoms.

## 2014-12-26 NOTE — ED Provider Notes (Signed)
CSN: [id number]     Arrival date & time 12/26/14  1117 History   First MD Initiated Contact with Patient 12/26/14 1150     Chief Complaint  Patient presents with  . Chest Pain  . Shortness of Breath     (Consider location/radiation/quality/duration/timing/severity/associated sxs/prior Treatment) HPI Comments: Pt is a 59 yo female who presents to the ED with complaint of SOB and CP. Pt reports she has been having exertion SOB and CP for the past 2-3 months with worsening sxs this week. She notes that the SOB starts once she has been walking for appx. 5 min and then a few minutes later she begins to have CP. Pt describes her CP as a "deep ache" located at her midsternal region without radiation, no aggravating or relieving factors. She reports worsening SOB and CP at rest this week. She notes that her CP is currently resolved and her SOB has improved. She endorses nausea, intermittent wheezing and HA with her exertional SOB. Denies fever, chills, sore throat, abdominal pain, N/V/D, numbness, tingling, weakness. Pt admitted July 2014 for CP workup which was negative but EDG showed Schatzki ring. Pt has been following up with GI.   Patient is a 59 y.o. female presenting with chest pain and shortness of breath.  Chest Pain Associated symptoms: headache, nausea and shortness of breath   Shortness of Breath Associated symptoms: chest pain and headaches     Past Medical History  Diagnosis Date  . Thyroid disease   . Chest pain at rest 11/07/2012  . Hypothyroidism   . Shortness of breath 11/08/2012    RECENTLY  & WHEN i EXERCISE"  . GERD (gastroesophageal reflux disease)   . Eosinophilic esophagitis    Past Surgical History  Procedure Laterality Date  . Abdominal hysterectomy    . Esophagogastroduodenoscopy (egd) with esophageal dilation N/A 11/10/2012    Procedure: ESOPHAGOGASTRODUODENOSCOPY (EGD) WITH ESOPHAGEAL DILATION;  Surgeon: Gatha Mayer, MD;  Location: [facility];  Service:  Endoscopy;  Laterality: N/A;  [place] vs. [place]   Family History  Problem Relation Age of Onset  . Diabetes Mother   . Stroke Mother   . Coronary artery disease Father   . Coronary artery disease Brother    Social History  Substance Use Topics  . Smoking status: Never Smoker   . Smokeless tobacco: Never Used  . Alcohol Use: No   OB History    No data available     Review of Systems  Respiratory: Positive for shortness of breath.   Cardiovascular: Positive for chest pain.  Gastrointestinal: Positive for nausea.  Neurological: Positive for headaches.  All other systems reviewed and are negative.     Allergies  Penicillins  Home Medications   Prior to Admission medications   Medication Sig Start Date End Date Taking? Authorizing Provider  cimetidine (TAGAMET) 200 MG tablet Take 200 mg by mouth daily as needed (acid reflux).   Yes Historical Provider, MD  levothyroxine (SYNTHROID, LEVOTHROID) 125 MCG tablet Take 125 mcg by mouth daily before breakfast.   Yes Historical Provider, MD  lansoprazole (PREVACID) 15 MG capsule Take 2 capsules (30 mg total) by mouth daily before breakfast. Patient not taking: Reported on 12/26/2014 12/02/14   Gatha Mayer, MD  omeprazole-sodium bicarbonate (ZEGERID) 40-1100 MG per capsule Take 1 capsule by mouth at bedtime. Patient not taking: Reported on 12/26/2014 12/02/14   Gatha Mayer, MD  sucralfate (CARAFATE) 1 GM/10ML suspension Take 10 mLs (1 g total) by mouth 4 (  four) times daily. Patient not taking: Reported on 12/02/2014 04/30/14   Gatha Mayer, MD   BP 126/68 mmHg  Pulse 65  Temp(Src) 99.1 F (37.3 C)  Resp 22  Ht 5\' 2"  (1.575 m)  Wt 185 lb (83.915 kg)  BMI 33.83 kg/m2  SpO2 94% Physical Exam  Constitutional: She is oriented to person, place, and time. She appears well-developed and well-nourished.  HENT:  Head: Normocephalic and atraumatic.  Mouth/Throat: Oropharynx is clear and moist.  Eyes: Conjunctivae and EOM are  normal. Pupils are equal, round, and reactive to light. Right eye exhibits no discharge. Left eye exhibits no discharge. No scleral icterus.  Neck: Normal range of motion. Neck supple.  Cardiovascular: Normal rate, regular rhythm, normal heart sounds and intact distal pulses.   No murmur heard. Pulmonary/Chest: Effort normal and breath sounds normal. She has no wheezes. She has no rales. She exhibits tenderness (central chest with mild TTP).  Abdominal: Soft. Bowel sounds are normal. She exhibits no mass. There is no tenderness. There is no rebound and no guarding.  Musculoskeletal: Normal range of motion. She exhibits no edema or tenderness.  Lymphadenopathy:    She has no cervical adenopathy.  Neurological: She is alert and oriented to person, place, and time.  Skin: Skin is warm and dry.  Nursing note and vitals reviewed.   ED Course  Procedures (including critical care time) Labs Review Labs Reviewed  BASIC METABOLIC PANEL - Abnormal; Notable for the following:    Glucose, Bld 107 (*)    All other components within normal limits  CBC  I-STAT TROPOININ, ED    Imaging Review Dg Chest 2 View  12/26/2014   CLINICAL DATA:  Mid chest pain and shortness of breath.  EXAM: CHEST  2 VIEW  COMPARISON:  Chest x-rays dated 11/08/2012 and 02/22/2011  FINDINGS: There is new cardiomegaly since the prior exams. The patient also has right hilar adenopathy with a possible mass at the inferior aspect of the right hilum. There is fullness in the azygos region. Density overlying the lingula on the PA view is most likely due to a cardiac lead electrode.  No effusions.  No osseous abnormality.  IMPRESSION: New right hilar and mediastinal adenopathy. Probable mass at the inferior aspect of the right hilum. Findings are worrisome for malignancy.   Electronically Signed   By: Lorriane Shire M.D.   On: 12/26/2014 12:43   Ct Chest W Contrast  12/26/2014   CLINICAL DATA:  New right hilar and mediastinal adenopathy  seen on chest x-ray. Shortness of breath for 2 months.  EXAM: CT CHEST WITH CONTRAST  TECHNIQUE: Multidetector CT imaging of the chest was performed during intravenous contrast administration.  CONTRAST:  27mL OMNIPAQUE IOHEXOL 300 MG/ML  SOLN  COMPARISON:  Chest x-ray dated 12/26/2014  FINDINGS: There is no evidence of central pulmonary embolus. The heart and great vessels are normal in appearance.  There is a mediastinal and bilateral hilar lymphadenopathy. Enlarged pretracheal an perivascular lymph nodes are seen within the mediastinum. The largest conglomerate of lymph nodes is seen in the right hilum and measures 3.3 x 2.2 cm. There is no evidence of significant focal lung parenchymal consolidation, pleural effusion or pneumothorax. There are however numerous bilateral subpleural and paraseptal pulmonary nodules. The index pulmonary nodule is located within the superior segment of left lower lobe and measures 1.2 x 0.7 by 0.7 cm. Numerous other subpleural soft tissue and ground-glass few mm nodules scattered throughout both lungs.  There is  a prominent lymph node within the anterior mediastinal pericardial fat. No axillary lymphadenopathy is seen. The visualized portions of the thyroid gland are unremarkable in appearance.  The visualized portions of the liver and spleen are unremarkable.  The visualized portions of the pancreas, gallbladder, stomach, adrenal glands and kidneys are within normal limits.  There is a 3 mm sclerotic focus within the posterior aspect of T4 vertebral body.  IMPRESSION: Mediastinal and bilateral hilar lymphadenopathy.  1.2 cm soft tissue mass within superior segment of left lower lobe. Numerous other bilateral mostly subpleural soft tissue and ground-glass few mm pulmonary nodules.  3 mm sclerotic focus within T4 vertebral body.  Overall appearance is suspicious for metastatic disease, or less likely primary lung malignancy. Alternatively, pulmonary sarcoidosis may have a similar  appearance.  Evaluation with PET-CT and/or tissue diagnosis may be considered.   Electronically Signed   By: Fidela Salisbury M.D.   On: 12/26/2014 15:12   I have personally reviewed and evaluated these images and lab results as part of my medical decision-making.   EKG Interpretation None      Filed Vitals:   12/26/14 1330  BP: 126/68  Pulse: 65  Temp:   Resp: 22    MDM   Final diagnoses:  Mediastinal lymphadenopathy  Lung mass  SOB (shortness of breath)    Pt presents with worsening exertion SOB and CP x 2-3 months. Reports SOB and CP at rest this week. CP resolved in ED today. No PMH. VSS.   EKG, BMP, CBC unremarkable. Troponin negative. I do not suspect ACS at this time. CXR revealed new right hilar and mediastinal adenopathy. Probable mass at the inferior aspect of the right hilum. Findings are worrisome for malignancy. CT chest ordered. CT revealed mediastinal and bilateral hilar lymphadenopathy; 1.2 cm soft tissue mass within superior segment of left lower lobe; Numerous other bilateral mostly subpleural soft tissue and ground-glass few mm pulmonary nodules; 3 mm sclerotic focus within T4 vertebral body. Overall appearance is suspicious for metastatic disease, or less likely primary lung malignancy. Alternatively, pulmonary sarcoidosis may have a similar appearance. Discussed findings with pt. Pt denies tobacco use.   Consulted medicine- unassigned, plan to admit pt. Dr. Naaman Plummer will come see pt in ED. Orders placed for med-surg med.    Chesley Noon [place], [place] 12/26/14 1619  Milton Ferguson, MD 12/27/14 [phone]

## 2014-12-26 NOTE — ED Notes (Signed)
Patient transported to X-ray 

## 2014-12-27 DIAGNOSIS — D860 Sarcoidosis of lung: Secondary | ICD-10-CM | POA: Insufficient documentation

## 2014-12-27 DIAGNOSIS — R599 Enlarged lymph nodes, unspecified: Secondary | ICD-10-CM

## 2014-12-27 LAB — BASIC METABOLIC PANEL
ANION GAP: 9 (ref 5–15)
BUN: 11 mg/dL (ref 6–20)
CHLORIDE: 104 mmol/L (ref 101–111)
CO2: 25 mmol/L (ref 22–32)
Calcium: 8.9 mg/dL (ref 8.9–10.3)
Creatinine, Ser: 0.73 mg/dL (ref 0.44–1.00)
GFR calc Af Amer: >60 mL/min (ref >60)
GFR calc non Af Amer: >60 mL/min (ref >60)
GLUCOSE: 118 mg/dL — AB (ref 65–99)
POTASSIUM: 3.6 mmol/L (ref 3.5–5.1)
Sodium: 138 mmol/L (ref 135–145)

## 2014-12-27 LAB — CBC
HCT: 42.6 % (ref 36.0–46.0)
Hemoglobin: 14.3 g/dL (ref 12.0–15.0)
MCH: 28.7 pg (ref 26.0–34.0)
MCHC: 33.6 g/dL (ref 30.0–36.0)
MCV: 85.5 fL (ref 78.0–100.0)
PLATELETS: 253 10*3/uL (ref 150–400)
RBC: 4.98 MIL/uL (ref 3.87–5.11)
RDW: 13.5 % (ref 11.5–15.5)
WBC: 6.8 10*3/uL (ref 4.0–10.5)

## 2014-12-27 NOTE — Discharge Instructions (Signed)
Please follow up with Pulmonology as outpatient. Dr. Matilde Bash office will call you next with an appointment date for PET scan and Bronchoscopy with biopsy. If you do not hear back from them by Tuesday 12/31/14, PLEASE CALL THEIR OFFICE TO SCHEDULE AN APPOINTMENT.   [facility]  [address] [place] [address] [phone]  If you have any problems contacting Pulmonology or have any questions, please do not hesitate to call Dr. Beryle Beams at [phone].

## 2014-12-27 NOTE — Progress Notes (Signed)
Discussed discharge summary with patient. Reviewed all medications with patient. Patient ready for discharge.  

## 2014-12-27 NOTE — Consult Note (Signed)
Name: Katrina [name] MRN: [id number] DOB: March 10, 1956    ADMISSION DATE:  12/26/2014 CONSULTATION DATE:  9/9  REFERRING MD :  Beryle Beams   CHIEF COMPLAINT:  Abnormal CT chest/ hilar adenopathy and lung mass  BRIEF PATIENT DESCRIPTION:  59 year old female admitted 9/8 w/ cc: several weeks of persistent CP and associated dyspnea. CT chest demonstrated bilateral hilar adenopathy w/ 1.2 cm mass in LLL as well as several satellite sub pleural nodules. PCCM asked to eval to assist w/ diagnostics   SIGNIFICANT EVENTS    STUDIES:  9/8: CT neck: negative for adenopathy 9/8 CT chest: Mediastinal and bilateral hilar lymphadenopathy.1.2 cm soft tissue mass within superior segment of left lower lobe.Numerous other bilateral mostly subpleural soft tissue andground-glass few mm pulmonary nodules. 3 mm sclerotic focus within T4 vertebral body. 9/8 CT abd/pelvis: Apparent irregularity and thickening of the cecum and ascendingcolon possibly related to residual fecal content. A cecal mass is ot excluded. Colonoscopy recommended for better evaluation.Mildly prominent lymph nodes in the porta hepaticus, nonspecific.  HISTORY OF PRESENT ILLNESS:   59 yo F with a PMHx of hypothyroidism, GERD, eosinophilic esophagitis (dx by bx at which time she was also noted to  Have a Schatzki ring). presenting to [facility] hospital on 9/8 with progressive SOB and chest pain for the past few weeks. Stated the pain in her chest is epigastric and substernal. Denied having any cough or sputum production. Also complaining of dysphagia to both solids and liquids and hoarseness of voice. Stated she has noticed a mass on the right side of her neck for the past 1 year, which is painful sometimes. Also complaining of sore throat associated with right ear pain. States she went to see her dentist 3 weeks ago and they checked her for TMJ but she was not told anything about the neck mass. No history of smoking, alcohol, or drug use. Never  used any chewing tobacco. Stated she has been feeling weak and tired for several months. Denied having any fevers or night sweats. Denies any loss of appetite or weight loss. States she noticed blood in her stool back in 06/2014 and was told by GI to get a colonoscopy done. However, due to lack of insurance she never got it done. Patient had a partial hysterectomy in 2002 due to history of fibroids.   Meds:  PAST MEDICAL HISTORY :   has a past medical history of Thyroid disease; Chest pain at rest (11/07/2012); Hypothyroidism; Shortness of breath (11/08/2012); GERD (gastroesophageal reflux disease); and Eosinophilic esophagitis.  has past surgical history that includes Abdominal hysterectomy and Esophagogastroduodenoscopy (egd) with esophageal dilation (N/A, 11/10/2012). Prior to Admission medications   Medication Sig Start Date End Date Taking? Authorizing Provider  cimetidine (TAGAMET) 200 MG tablet Take 200 mg by mouth daily as needed (acid reflux).   Yes Historical Provider, MD  levothyroxine (SYNTHROID, LEVOTHROID) 125 MCG tablet Take 125 mcg by mouth daily before breakfast.   Yes Historical Provider, MD  lansoprazole (PREVACID) 15 MG capsule Take 2 capsules (30 mg total) by mouth daily before breakfast. Patient not taking: Reported on 12/26/2014 12/02/14   Gatha Mayer, MD  omeprazole-sodium bicarbonate (ZEGERID) 40-1100 MG per capsule Take 1 capsule by mouth at bedtime. Patient not taking: Reported on 12/26/2014 12/02/14   Gatha Mayer, MD  sucralfate (CARAFATE) 1 GM/10ML suspension Take 10 mLs (1 g total) by mouth 4 (four) times daily. Patient not taking: Reported on 12/02/2014 04/30/14   Gatha Mayer, MD  Allergies  Allergen Reactions  . Omeprazole Anaphylaxis  . Penicillins Shortness Of Breath and Rash    FAMILY HISTORY:  family history includes Coronary artery disease in her brother and father; Diabetes in her mother; Stroke in her mother. SOCIAL HISTORY:  reports that she has never  smoked. She has never used smokeless tobacco. She reports that she does not drink alcohol or use illicit drugs.  REVIEW OF SYSTEMS:   Constitutional: Negative for fever, chills, weight loss, malaise/fatigue and diaphoresis.  HENT: Negative for hearing loss, ear pain, nosebleeds, congestion, sore throat, neck pain, tinnitus and ear discharge.   Eyes: Negative for blurred vision, double vision, photophobia, pain, discharge and redness.  Respiratory: Negative for cough, hemoptysis, sputum production, shortness of breath, wheezing and stridor.   Cardiovascular: Negative for chest pain, palpitations, orthopnea, claudication, leg swelling and PND.  Gastrointestinal: Negative for heartburn, nausea, vomiting, abdominal pain, diarrhea, constipation, blood in stool and melena.  Genitourinary: Negative for dysuria, urgency, frequency, hematuria and flank pain.  Musculoskeletal: Negative for myalgias, back pain, joint pain and falls.  Skin: Negative for itching and rash.  Neurological: Negative for dizziness, tingling, tremors, sensory change, speech change, focal weakness, seizures, loss of consciousness, weakness and headaches.  Endo/Heme/Allergies: Negative for environmental allergies and polydipsia. Does not bruise/bleed easily.  SUBJECTIVE:  Feels better VITAL SIGNS: Temp:  [97.7 F (36.5 C)-98.2 F (36.8 C)] 97.7 F (36.5 C) (09/09 1311) Pulse Rate:  [62-81] 62 (09/09 1311) Resp:  [13-27] 18 (09/09 1311) BP: (114-144)/(54-79) 137/65 mmHg (09/09 1311) SpO2:  [92 %-96 %] 94 % (09/09 1311) Weight:  [84.2 kg (185 lb 10 oz)] 84.2 kg (185 lb 10 oz) (09/08 1956)  PHYSICAL EXAMINATION: General:  Awake. Alert, no focal def  Neuro:  No focal def  HEENT:  NCAT, no JVD Cardiovascular:  rrr Lungs:  Clear and w/out accessory muscle use  Abdomen:  Soft, non-tender + bowel sounds  Musculoskeletal:  Intact  Skin:  Intact    Recent Labs Lab 12/26/14 1134 12/27/14 0442  NA 139 138  K 4.1 3.6  CL  106 104  CO2 25 25  BUN 14 11  CREATININE 0.86 0.73  GLUCOSE 107* 118*    Recent Labs Lab 12/26/14 1134 12/27/14 0442  HGB 14.6 14.3  HCT 43.6 42.6  WBC 6.2 6.8  PLT 249 253   Dg Chest 2 View  12/26/2014   CLINICAL DATA:  Mid chest pain and shortness of breath.  EXAM: CHEST  2 VIEW  COMPARISON:  Chest x-rays dated 11/08/2012 and 02/22/2011  FINDINGS: There is new cardiomegaly since the prior exams. The patient also has right hilar adenopathy with a possible mass at the inferior aspect of the right hilum. There is fullness in the azygos region. Density overlying the lingula on the PA view is most likely due to a cardiac lead electrode.  No effusions.  No osseous abnormality.  IMPRESSION: New right hilar and mediastinal adenopathy. Probable mass at the inferior aspect of the right hilum. Findings are worrisome for malignancy.   Electronically Signed   By: Lorriane Shire M.D.   On: 12/26/2014 12:43   Ct Soft Tissue Neck W Contrast  12/26/2014   CLINICAL DATA:  Mediastinal and hilar lymphadenopathy.  EXAM: CT NECK WITH CONTRAST  TECHNIQUE: Multidetector CT imaging of the neck was performed using the standard protocol following the bolus administration of intravenous contrast.  CONTRAST:  82mL OMNIPAQUE IOHEXOL 300 MG/ML  SOLN  COMPARISON:  None.  FINDINGS: Pharynx and  larynx: Normal.  Salivary glands: Normal.  Thyroid: Atrophic.  No nodules.  Lymph nodes: Normal.  No adenopathy in the neck.  Vascular: Normal.  Limited intracranial: Normal.  Visualized orbits: Normal.  Mastoids and visualized paranasal sinuses: Normal.  Skeleton: Degenerative disc disease at C5-6 and C6-7. Otherwise normal.  Upper chest: 3.6 mm nodule in the posterior aspect of the right upper lobe on image 106 of series 201. This is visible on the prior chest CT.  IMPRESSION: Negative CT scan of the neck.  No pathologic adenopathy.   Electronically Signed   By: Lorriane Shire M.D.   On: 12/26/2014 19:08   Ct Chest W  Contrast  12/26/2014   CLINICAL DATA:  New right hilar and mediastinal adenopathy seen on chest x-ray. Shortness of breath for 2 months.  EXAM: CT CHEST WITH CONTRAST  TECHNIQUE: Multidetector CT imaging of the chest was performed during intravenous contrast administration.  CONTRAST:  75mL OMNIPAQUE IOHEXOL 300 MG/ML  SOLN  COMPARISON:  Chest x-ray dated 12/26/2014  FINDINGS: There is no evidence of central pulmonary embolus. The heart and great vessels are normal in appearance.  There is a mediastinal and bilateral hilar lymphadenopathy. Enlarged pretracheal an perivascular lymph nodes are seen within the mediastinum. The largest conglomerate of lymph nodes is seen in the right hilum and measures 3.3 x 2.2 cm. There is no evidence of significant focal lung parenchymal consolidation, pleural effusion or pneumothorax. There are however numerous bilateral subpleural and paraseptal pulmonary nodules. The index pulmonary nodule is located within the superior segment of left lower lobe and measures 1.2 x 0.7 by 0.7 cm. Numerous other subpleural soft tissue and ground-glass few mm nodules scattered throughout both lungs.  There is a prominent lymph node within the anterior mediastinal pericardial fat. No axillary lymphadenopathy is seen. The visualized portions of the thyroid gland are unremarkable in appearance.  The visualized portions of the liver and spleen are unremarkable.  The visualized portions of the pancreas, gallbladder, stomach, adrenal glands and kidneys are within normal limits.  There is a 3 mm sclerotic focus within the posterior aspect of T4 vertebral body.  IMPRESSION: Mediastinal and bilateral hilar lymphadenopathy.  1.2 cm soft tissue mass within superior segment of left lower lobe. Numerous other bilateral mostly subpleural soft tissue and ground-glass few mm pulmonary nodules.  3 mm sclerotic focus within T4 vertebral body.  Overall appearance is suspicious for metastatic disease, or less likely  primary lung malignancy. Alternatively, pulmonary sarcoidosis may have a similar appearance.  Evaluation with PET-CT and/or tissue diagnosis may be considered.   Electronically Signed   By: Fidela Salisbury M.D.   On: 12/26/2014 15:12   Ct Abdomen Pelvis W Contrast  12/26/2014   CLINICAL DATA:  59 year old female with mediastinal adenopathy seen on the chest CT performed earlier the liver, gallbladder, pancreas, spleen, adrenal glands, kidneys, visualized ureters, and urinary bladder appear unremarkable. Hysterectomy.  Today.  EXAM: CT ABDOMEN AND PELVIS WITH CONTRAST  TECHNIQUE: Multidetector CT imaging of the abdomen and pelvis was performed using the standard protocol following bolus administration of intravenous contrast.  CONTRAST:  49mL OMNIPAQUE IOHEXOL 300 MG/ML  SOLN  COMPARISON:  CT dated 11/28/2013  FINDINGS: No intra-abdominal free air or free fluid.  The liver, gallbladder, pancreas, spleen, adrenal glands, kidneys, visualized ureters, and urinary bladder appear unremarkable. Hysterectomy.  There is irregular and thickened appearance of the wall of the cecum and proximal colon. Although this may be related to presence of residual fecal material the  cecal mass is not excluded. Colonoscopy is recommended for better evaluation. There is no evidence of bowel obstruction. The appendix is unremarkable.  The abdominal aorta and IVC are patent. A mildly enlarged lymph nodes noted in the region of porta hepatis. Top-normal peripancreatic and portacaval lymph nodes noted, nonspecific.  Midline vertical anterior pelvic wall incisional scar. There is mild degenerative changes of the spine with no acute fracture.  IMPRESSION: Apparent irregularity and thickening of the cecum and ascending colon possibly related to residual fecal content. A cecal mass is not excluded. Colonoscopy recommended for better evaluation.  Mildly prominent lymph nodes in the porta hepaticus, nonspecific.  No acute intra-abdominal or  pelvic pathology identified.   Electronically Signed   By: Anner Crete M.D.   On: 12/26/2014 19:12    ASSESSMENT / PLAN:  LLL lung mass Hilar adenopathy  Subpleural satellite pulmonary nodules   Discussion  Worrisome for possible malignancy   Plan Will schedule PET and EBUS. Our office will call her w/ appointment   Erick Colace ACNP-BC [place] Pager # [phone] [place] # [phone] if no answer  12/27/2014, 1:49 PM

## 2014-12-27 NOTE — Progress Notes (Signed)
Subjective: Patient was seen and examined at bedside today. She is not complaining of any SOB or chest pain. Vitals stable. No other complaints.  Objective: Vital signs in last 24 hours: Filed Vitals:   12/26/14 1857 12/26/14 1956 12/27/14 0500 12/27/14 1311  BP: 125/68 136/70 114/54 137/65  Pulse: 65 69 62 62  Temp:  97.9 F (36.6 C) 98.2 F (36.8 C) 97.7 F (36.5 C)  TempSrc:  Oral  Oral  Resp: 13 18 17 18   Height:  5\' 2"  (1.575 m)    Weight:  185 lb 10 oz (84.2 kg)    SpO2: 92% 92% 96% 94%   Weight change:   Intake/Output Summary (Last 24 hours) at 12/27/14 1559 Last data filed at 12/27/14 1210  Gross per 24 hour  Intake    550 ml  Output      0 ml  Net    550 ml   Physical Exam  Constitutional: She is oriented to person, place, and time. She appears well-developed and well-nourished. No distress.  HENT:  Head: Normocephalic and atraumatic.  Mouth/Throat: No oropharyngeal exudate.  Eyes: EOM are normal. Pupils are equal, round, and reactive to light.  Neck: Neck supple. No tracheal deviation present.  Cardiovascular: Normal rate, regular rhythm and intact distal pulses. Exam reveals no gallop and no friction rub.  No murmur heard. Pulmonary/Chest: Effort normal. No respiratory distress. She has no wheezes. She has no rales.  Abdominal: Soft. Bowel sounds are normal. She exhibits no distension. There is no tenderness.  Musculoskeletal: Normal range of motion. She exhibits no edema or tenderness.  Lymphadenopathy:   She has no cervical adenopathy.  Neurological: She is alert and oriented to person, place, and time. No cranial nerve deficit.  Skin: Skin is warm and dry.  Lab Results: Basic Metabolic Panel:  Recent Labs Lab 12/26/14 1134 12/26/14 1726 12/27/14 0442  NA 139  --  138  K 4.1  --  3.6  CL 106  --  104  CO2 25  --  25  GLUCOSE 107*  --  118*  BUN 14  --  11  CREATININE 0.86  --  0.73  CALCIUM 9.7  --  8.9  MG  --  1.9  --   PHOS  --   3.2  --    Liver Function Tests:  Recent Labs Lab 12/26/14 1726  AST 25  ALT 30  ALKPHOS 77  BILITOT 0.8  PROT 7.6  ALBUMIN 3.9   CBC:  Recent Labs Lab 12/26/14 1134 12/27/14 0442  WBC 6.2 6.8  HGB 14.6 14.3  HCT 43.6 42.6  MCV 85.7 85.5  PLT 249 253   Thyroid Function Tests:  Recent Labs Lab 12/26/14 1726  TSH 2.001   Coagulation:  Recent Labs Lab 12/26/14 1726  LABPROT 14.1  INR 1.07   Urinalysis:  Recent Labs Lab 12/26/14 1719  COLORURINE YELLOW  LABSPEC 1.013  PHURINE 6.0  GLUCOSEU NEGATIVE  HGBUR NEGATIVE  BILIRUBINUR NEGATIVE  KETONESUR NEGATIVE  PROTEINUR NEGATIVE  UROBILINOGEN 0.2  NITRITE NEGATIVE  LEUKOCYTESUR NEGATIVE   Micro Results: No results found for this or any previous visit (from the past 240 hour(s)). Studies/Results: Dg Chest 2 View  12/26/2014   CLINICAL DATA:  Mid chest pain and shortness of breath.  EXAM: CHEST  2 VIEW  COMPARISON:  Chest x-rays dated 11/08/2012 and 02/22/2011  FINDINGS: There is new cardiomegaly since the prior exams. The patient also has right hilar adenopathy with a  possible mass at the inferior aspect of the right hilum. There is fullness in the azygos region. Density overlying the lingula on the PA view is most likely due to a cardiac lead electrode.  No effusions.  No osseous abnormality.  IMPRESSION: New right hilar and mediastinal adenopathy. Probable mass at the inferior aspect of the right hilum. Findings are worrisome for malignancy.   Electronically Signed   By: Lorriane Shire M.D.   On: 12/26/2014 12:43   Ct Soft Tissue Neck W Contrast  12/26/2014   CLINICAL DATA:  Mediastinal and hilar lymphadenopathy.  EXAM: CT NECK WITH CONTRAST  TECHNIQUE: Multidetector CT imaging of the neck was performed using the standard protocol following the bolus administration of intravenous contrast.  CONTRAST:  71mL OMNIPAQUE IOHEXOL 300 MG/ML  SOLN  COMPARISON:  None.  FINDINGS: Pharynx and larynx: Normal.  Salivary  glands: Normal.  Thyroid: Atrophic.  No nodules.  Lymph nodes: Normal.  No adenopathy in the neck.  Vascular: Normal.  Limited intracranial: Normal.  Visualized orbits: Normal.  Mastoids and visualized paranasal sinuses: Normal.  Skeleton: Degenerative disc disease at C5-6 and C6-7. Otherwise normal.  Upper chest: 3.6 mm nodule in the posterior aspect of the right upper lobe on image 106 of series 201. This is visible on the prior chest CT.  IMPRESSION: Negative CT scan of the neck.  No pathologic adenopathy.   Electronically Signed   By: Lorriane Shire M.D.   On: 12/26/2014 19:08   Ct Chest W Contrast  12/26/2014   CLINICAL DATA:  New right hilar and mediastinal adenopathy seen on chest x-ray. Shortness of breath for 2 months.  EXAM: CT CHEST WITH CONTRAST  TECHNIQUE: Multidetector CT imaging of the chest was performed during intravenous contrast administration.  CONTRAST:  17mL OMNIPAQUE IOHEXOL 300 MG/ML  SOLN  COMPARISON:  Chest x-ray dated 12/26/2014  FINDINGS: There is no evidence of central pulmonary embolus. The heart and great vessels are normal in appearance.  There is a mediastinal and bilateral hilar lymphadenopathy. Enlarged pretracheal an perivascular lymph nodes are seen within the mediastinum. The largest conglomerate of lymph nodes is seen in the right hilum and measures 3.3 x 2.2 cm. There is no evidence of significant focal lung parenchymal consolidation, pleural effusion or pneumothorax. There are however numerous bilateral subpleural and paraseptal pulmonary nodules. The index pulmonary nodule is located within the superior segment of left lower lobe and measures 1.2 x 0.7 by 0.7 cm. Numerous other subpleural soft tissue and ground-glass few mm nodules scattered throughout both lungs.  There is a prominent lymph node within the anterior mediastinal pericardial fat. No axillary lymphadenopathy is seen. The visualized portions of the thyroid gland are unremarkable in appearance.  The visualized  portions of the liver and spleen are unremarkable.  The visualized portions of the pancreas, gallbladder, stomach, adrenal glands and kidneys are within normal limits.  There is a 3 mm sclerotic focus within the posterior aspect of T4 vertebral body.  IMPRESSION: Mediastinal and bilateral hilar lymphadenopathy.  1.2 cm soft tissue mass within superior segment of left lower lobe. Numerous other bilateral mostly subpleural soft tissue and ground-glass few mm pulmonary nodules.  3 mm sclerotic focus within T4 vertebral body.  Overall appearance is suspicious for metastatic disease, or less likely primary lung malignancy. Alternatively, pulmonary sarcoidosis may have a similar appearance.  Evaluation with PET-CT and/or tissue diagnosis may be considered.   Electronically Signed   By: Fidela Salisbury M.D.   On: 12/26/2014 15:12  Ct Abdomen Pelvis W Contrast  12/26/2014   CLINICAL DATA:  59 year old female with mediastinal adenopathy seen on the chest CT performed earlier the liver, gallbladder, pancreas, spleen, adrenal glands, kidneys, visualized ureters, and urinary bladder appear unremarkable. Hysterectomy.  Today.  EXAM: CT ABDOMEN AND PELVIS WITH CONTRAST  TECHNIQUE: Multidetector CT imaging of the abdomen and pelvis was performed using the standard protocol following bolus administration of intravenous contrast.  CONTRAST:  67mL OMNIPAQUE IOHEXOL 300 MG/ML  SOLN  COMPARISON:  CT dated 11/28/2013  FINDINGS: No intra-abdominal free air or free fluid.  The liver, gallbladder, pancreas, spleen, adrenal glands, kidneys, visualized ureters, and urinary bladder appear unremarkable. Hysterectomy.  There is irregular and thickened appearance of the wall of the cecum and proximal colon. Although this may be related to presence of residual fecal material the cecal mass is not excluded. Colonoscopy is recommended for better evaluation. There is no evidence of bowel obstruction. The appendix is unremarkable.  The  abdominal aorta and IVC are patent. A mildly enlarged lymph nodes noted in the region of porta hepatis. Top-normal peripancreatic and portacaval lymph nodes noted, nonspecific.  Midline vertical anterior pelvic wall incisional scar. There is mild degenerative changes of the spine with no acute fracture.  IMPRESSION: Apparent irregularity and thickening of the cecum and ascending colon possibly related to residual fecal content. A cecal mass is not excluded. Colonoscopy recommended for better evaluation.  Mildly prominent lymph nodes in the porta hepaticus, nonspecific.  No acute intra-abdominal or pelvic pathology identified.   Electronically Signed   By: Anner Crete M.D.   On: 12/26/2014 19:12   Medications: I have reviewed the patient's current medications. Scheduled Meds: . enoxaparin (LOVENOX) injection  40 mg Subcutaneous Q24H  . famotidine  20 mg Oral Daily  . levothyroxine  125 mcg Oral QAC breakfast   Continuous Infusions:  PRN Meds:.acetaminophen **OR** acetaminophen, ondansetron **OR** ondansetron (ZOFRAN) IV, senna-docusate Assessment/Plan: Active Problems:   Mediastinal lymphadenopathy   Lung mass  Mediastinal Lymphadenopathy Patient presented with progressive chest pain and SOB. CT showed Mediastinal and bilateral hilar lymphadenopathy.1.2 cm soft tissue mass within superior segment of left lower lobe. Numerous other bilateral mostly subpleural soft tissue and ground-glass few mm pulmonary nodules.3 mm sclerotic focus within T4 vertebral body. Overall appearance is suspicious for metastatic disease, or less likely primary lung malignancy. Patient is not complaining of any SOB or chest pain today. Vitals are stable. Labs including LDH, Uric acid, LFTs, INR, aPTT , Mag, Phos, and Prealbumin normal. Scr 0.73. Patient was seen by pulmonology today. I spoke to Dr. Vaughan Browner and he suggested outpatient PET scan and bronchoscopy with biopsy. He said his office will call the patient next  week to schedule these procedures. -Tylenol 650 mg prn  -Zofran prnt -HIV antibody pending -Hep C antibody pending  -ACE level pending  GERD -Famotidine 20 mg qd   Hypothyroidism TSH normal.  -continue Synthroid 125 mcg qd  Diet: regular   DVT ppx: Lovenox  Code: FULL   Dispo: Disposition is deferred at this time, awaiting improvement of current medical problems.  Anticipated discharge in approximately 0 day(s).   The patient does have a current PCP (No Pcp Per Patient) and does need an [facility] hospital follow-up appointment after discharge.  The patient does not have transportation limitations that hinder transportation to clinic appointments.  .Services Needed at time of discharge: Y = Yes, Blank = No PT:   OT:   RN:   Equipment:   Other:  LOS: 1 day   Shela Leff, MD 12/27/2014, 3:59 PM

## 2014-12-27 NOTE — Discharge Summary (Signed)
Name: Katrina [name] MRN: [id number] DOB: 10/21/1955 59 y.o. PCP: No Pcp Per Patient  Date of Admission: 12/26/2014 11:43 AM Date of Discharge: 12/27/2014 Attending Physician: Annia Belt, MD  Discharge Diagnosis:  Active Problems:   Mediastinal lymphadenopathy   Lung mass  Discharge Medications:   Medication List    STOP taking these medications        lansoprazole 15 MG capsule  Commonly known as:  PREVACID     omeprazole-sodium bicarbonate 40-1100 MG per capsule  Commonly known as:  ZEGERID     sucralfate 1 GM/10ML suspension  Commonly known as:  CARAFATE      TAKE these medications        cimetidine 200 MG tablet  Commonly known as:  TAGAMET  Take 200 mg by mouth daily as needed (acid reflux).     levothyroxine 125 MCG tablet  Commonly known as:  SYNTHROID, LEVOTHROID  Take 125 mcg by mouth daily before breakfast.        Disposition and follow-up:   Katrina [name] was discharged from [facility] in Stable condition.  At the hospital follow up visit please address:  1.  Mediastinal lymphadenopathy Patient is to follow up with Pulmonology (Dr. Vaughan Browner) as outpatient. His office will be calling the patient with an appointment date for PET scan and Bronchoscopy with Biopsy this week.   2.  Labs / imaging needed at time of follow-up: PET scan, Bronchoscopy with Biopsy  3.  Pending labs/ test needing follow-up: HIV antibody, Hep C antibody   Follow-up Appointments:     Follow-up Information    Follow up with Marshell Garfinkel, MD.   Specialty:  Pulmonary Disease   Why:  Pulmonology office will call you with an appointment date next week.    Contact information:   [address] [place] [address] [phone]       Discharge Instructions: Please follow up with Pulmonology as outpatient. Dr. Matilde Bash office will call you next with an appointment date for PET scan and Bronchoscopy with biopsy. If you do not hear back  from them by Tuesday 12/31/14, PLEASE CALL THEIR OFFICE TO SCHEDULE AN APPOINTMENT.   [facility]  [address] [place] [address] [phone]  If you have any problems contacting Pulmonology or have any questions, please do not hesitate to call Dr. Beryle Beams at [phone].  Consultations:   Pulmonology (Dr. Vaughan Browner)  Procedures Performed:  Dg Chest 2 View  12/26/2014   CLINICAL DATA:  Mid chest pain and shortness of breath.  EXAM: CHEST  2 VIEW  COMPARISON:  Chest x-rays dated 11/08/2012 and 02/22/2011  FINDINGS: There is new cardiomegaly since the prior exams. The patient also has right hilar adenopathy with a possible mass at the inferior aspect of the right hilum. There is fullness in the azygos region. Density overlying the lingula on the PA view is most likely due to a cardiac lead electrode.  No effusions.  No osseous abnormality.  IMPRESSION: New right hilar and mediastinal adenopathy. Probable mass at the inferior aspect of the right hilum. Findings are worrisome for malignancy.   Electronically Signed   By: Lorriane Shire M.D.   On: 12/26/2014 12:43   Ct Soft Tissue Neck W Contrast  12/26/2014   CLINICAL DATA:  Mediastinal and hilar lymphadenopathy.  EXAM: CT NECK WITH CONTRAST  TECHNIQUE: Multidetector CT imaging of the neck was performed using the standard protocol following the bolus  administration of intravenous contrast.  CONTRAST:  78mL OMNIPAQUE IOHEXOL 300 MG/ML  SOLN  COMPARISON:  None.  FINDINGS: Pharynx and larynx: Normal.  Salivary glands: Normal.  Thyroid: Atrophic.  No nodules.  Lymph nodes: Normal.  No adenopathy in the neck.  Vascular: Normal.  Limited intracranial: Normal.  Visualized orbits: Normal.  Mastoids and visualized paranasal sinuses: Normal.  Skeleton: Degenerative disc disease at C5-6 and C6-7. Otherwise normal.  Upper chest: 3.6 mm nodule in the posterior aspect of the right upper lobe on image 106 of series 201. This is visible on the  prior chest CT.  IMPRESSION: Negative CT scan of the neck.  No pathologic adenopathy.   Electronically Signed   By: Lorriane Shire M.D.   On: 12/26/2014 19:08   Ct Chest W Contrast  12/26/2014   CLINICAL DATA:  New right hilar and mediastinal adenopathy seen on chest x-ray. Shortness of breath for 2 months.  EXAM: CT CHEST WITH CONTRAST  TECHNIQUE: Multidetector CT imaging of the chest was performed during intravenous contrast administration.  CONTRAST:  28mL OMNIPAQUE IOHEXOL 300 MG/ML  SOLN  COMPARISON:  Chest x-ray dated 12/26/2014  FINDINGS: There is no evidence of central pulmonary embolus. The heart and great vessels are normal in appearance.  There is a mediastinal and bilateral hilar lymphadenopathy. Enlarged pretracheal an perivascular lymph nodes are seen within the mediastinum. The largest conglomerate of lymph nodes is seen in the right hilum and measures 3.3 x 2.2 cm. There is no evidence of significant focal lung parenchymal consolidation, pleural effusion or pneumothorax. There are however numerous bilateral subpleural and paraseptal pulmonary nodules. The index pulmonary nodule is located within the superior segment of left lower lobe and measures 1.2 x 0.7 by 0.7 cm. Numerous other subpleural soft tissue and ground-glass few mm nodules scattered throughout both lungs.  There is a prominent lymph node within the anterior mediastinal pericardial fat. No axillary lymphadenopathy is seen. The visualized portions of the thyroid gland are unremarkable in appearance.  The visualized portions of the liver and spleen are unremarkable.  The visualized portions of the pancreas, gallbladder, stomach, adrenal glands and kidneys are within normal limits.  There is a 3 mm sclerotic focus within the posterior aspect of T4 vertebral body.  IMPRESSION: Mediastinal and bilateral hilar lymphadenopathy.  1.2 cm soft tissue mass within superior segment of left lower lobe. Numerous other bilateral mostly subpleural soft  tissue and ground-glass few mm pulmonary nodules.  3 mm sclerotic focus within T4 vertebral body.  Overall appearance is suspicious for metastatic disease, or less likely primary lung malignancy. Alternatively, pulmonary sarcoidosis may have a similar appearance.  Evaluation with PET-CT and/or tissue diagnosis may be considered.   Electronically Signed   By: Fidela Salisbury M.D.   On: 12/26/2014 15:12   Ct Abdomen Pelvis W Contrast  12/26/2014   CLINICAL DATA:  59 year old female with mediastinal adenopathy seen on the chest CT performed earlier the liver, gallbladder, pancreas, spleen, adrenal glands, kidneys, visualized ureters, and urinary bladder appear unremarkable. Hysterectomy.  Today.  EXAM: CT ABDOMEN AND PELVIS WITH CONTRAST  TECHNIQUE: Multidetector CT imaging of the abdomen and pelvis was performed using the standard protocol following bolus administration of intravenous contrast.  CONTRAST:  87mL OMNIPAQUE IOHEXOL 300 MG/ML  SOLN  COMPARISON:  CT dated 11/28/2013  FINDINGS: No intra-abdominal free air or free fluid.  The liver, gallbladder, pancreas, spleen, adrenal glands, kidneys, visualized ureters, and urinary bladder appear unremarkable. Hysterectomy.  There is irregular and thickened  appearance of the wall of the cecum and proximal colon. Although this may be related to presence of residual fecal material the cecal mass is not excluded. Colonoscopy is recommended for better evaluation. There is no evidence of bowel obstruction. The appendix is unremarkable.  The abdominal aorta and IVC are patent. A mildly enlarged lymph nodes noted in the region of porta hepatis. Top-normal peripancreatic and portacaval lymph nodes noted, nonspecific.  Midline vertical anterior pelvic wall incisional scar. There is mild degenerative changes of the spine with no acute fracture.  IMPRESSION: Apparent irregularity and thickening of the cecum and ascending colon possibly related to residual fecal content. A  cecal mass is not excluded. Colonoscopy recommended for better evaluation.  Mildly prominent lymph nodes in the porta hepaticus, nonspecific.  No acute intra-abdominal or pelvic pathology identified.   Electronically Signed   By: Anner Crete M.D.   On: 12/26/2014 19:12    Admission HPI: Patient is a 59 yo F with a PMHx of hypothyroidism, GERD, eosinophilic esophagitis presenting to [facility] with progressive SOB and chest pain for the past few weeks. States the pain in her chest is epigastric and substernal. Denies having any cough or sputum production. Patient has a history of acid reflux and currently taking Cimetidine. She was diagnosed with eosinophilic esophagitis in 6962 and biopsy at the time showed inflammation and presence of Schatzki ring. Patient is also complaining of dysphagia to both solids and liquids and hoarseness of voice. States she has noticed a mass on the right side of her neck for the past 1 year, which is painful sometimes. Also complaining of sore throat associated with right ear pain. States she went to see her dentist 3 weeks ago and they checked her for TMJ but she was not told anything about the neck mass. No history of smoking, alcohol, or drug use. Never used any chewing tobacco. States she has been feeling weak and tired for several months. Denies having any fevers or night sweats. Denies any loss of appetite or weight loss. States she noticed blood in her stool back in 06/2014 and was told by GI to get a colonoscopy done. However, due to lack of insurance she never got it done. Patient had a partial hysterectomy in 2002 due to history of fibroids. States her mother had blood cancer (myelodysplastic syndrome?) and died at the age of 83. States she was last sexually active 1 year ago, female partner. As per patient, she was this partner for 8 years and he had sexual relations with other people. During physical exam, it was noted that the patient had a wart on the plantar  surface of her foot.   Hospital Course by problem list: Active Problems:   Mediastinal lymphadenopathy   Lung mass   Mediastinal Lymphadenopathy Patient presented with progressive chest pain and SOB. CT showed Mediastinal and bilateral hilar lymphadenopathy.1.2 cm soft tissue mass within superior segment of left lower lobe. Numerous other bilateral mostly subpleural soft tissue and ground-glass few mm pulmonary nodules.3 mm sclerotic focus within T4 vertebral body. Overall appearance suspicious for metastatic disease, or less likely primary lung malignancy. CT of head and neck negative for any pathologic lymphadenopathy. Patient is a never smoker. She did not have external lymphadenopathhy on exam. ACE level high (99). Normal CBC and blood chemistry profile. Labs including LDH, Uric acid, LFTs, INR, aPTT , Mag, Phos, and Prealbumin normal. Scr 0.73. CT scan of the neck, abdomen, and pelvis was remarkable for a thickened wall  of the cecum and proximal colon, however, the appearance of the pathology in the lung is not suggestive of metastatic colon cancer. Patient was seen by pulmonology (Dr. Vaughan Browner) and he suggested outpatient PET scan and bronchoscopy with biopsy.  Patient remained clinically stable - vitals were stable and she was not hypoxic at rest. No evidence of superior vena cava syndrome. Pt is to follow up with Pulmonology as outpatient. Dr. Matilde Bash office will call her the week of 12/30/14 to schedule these procedures. Patient was advised to call pulmonology office if she did not hear back from them by Tuesday 12/31/14. In addition, she was provided with our attending Dr. Azucena Freed cell phone number in case she had any questions or had trouble making an appointment with Pulmonology.   GERD -continued home med Famotidine 20 mg qd   Hypothyroidism TSH normal.  -continued home med Synthroid 125 mcg qd  Discharge Vitals:   BP 137/65 mmHg  Pulse 62  Temp(Src) 97.7 F (36.5 C) (Oral)   Resp 18  Ht 5\' 2"  (1.575 m)  Wt 185 lb 10 oz (84.2 kg)  BMI 33.94 kg/m2  SpO2 94%  Discharge Labs:  Results for orders placed or performed during the hospital encounter of 12/26/14 (from the past 24 hour(s))  Urinalysis, Routine w reflex microscopic (not at [facility])     Status: None   Collection Time: 12/26/14  5:19 PM  Result Value Ref Range   Color, Urine YELLOW YELLOW   APPearance CLEAR CLEAR   Specific Gravity, Urine 1.013 1.005 - 1.030   pH 6.0 5.0 - 8.0   Glucose, UA NEGATIVE NEGATIVE mg/dL   Hgb urine dipstick NEGATIVE NEGATIVE   Bilirubin Urine NEGATIVE NEGATIVE   Ketones, ur NEGATIVE NEGATIVE mg/dL   Protein, ur NEGATIVE NEGATIVE mg/dL   Urobilinogen, UA 0.2 0.0 - 1.0 mg/dL   Nitrite NEGATIVE NEGATIVE   Leukocytes, UA NEGATIVE NEGATIVE  TSH     Status: None   Collection Time: 12/26/14  5:26 PM  Result Value Ref Range   TSH 2.001 0.350 - 4.500 uIU/mL  Lactate dehydrogenase     Status: None   Collection Time: 12/26/14  5:26 PM  Result Value Ref Range   LDH 181 98 - 192 U/L  Uric acid     Status: None   Collection Time: 12/26/14  5:26 PM  Result Value Ref Range   Uric Acid, Serum 5.8 2.3 - 6.6 mg/dL  Magnesium     Status: None   Collection Time: 12/26/14  5:26 PM  Result Value Ref Range   Magnesium 1.9 1.7 - 2.4 mg/dL  Hepatic function panel     Status: None   Collection Time: 12/26/14  5:26 PM  Result Value Ref Range   Total Protein 7.6 6.5 - 8.1 g/dL   Albumin 3.9 3.5 - 5.0 g/dL   AST 25 15 - 41 U/L   ALT 30 14 - 54 U/L   Alkaline Phosphatase 77 38 - 126 U/L   Total Bilirubin 0.8 0.3 - 1.2 mg/dL   Bilirubin, Direct 0.2 0.1 - 0.5 mg/dL   Indirect Bilirubin 0.6 0.3 - 0.9 mg/dL  Phosphorus     Status: None   Collection Time: 12/26/14  5:26 PM  Result Value Ref Range   Phosphorus 3.2 2.5 - 4.6 mg/dL  Protime-INR     Status: None   Collection Time: 12/26/14  5:26 PM  Result Value Ref Range   Prothrombin Time 14.1 11.6 - 15.2 seconds   INR  1.07 0.00 - 1.49    APTT     Status: None   Collection Time: 12/26/14  5:26 PM  Result Value Ref Range   aPTT 33 24 - 37 seconds  Technologist smear review     Status: None   Collection Time: 12/26/14  5:26 PM  Result Value Ref Range   Tech Review MORPHOLOGY UNREMARKABLE   Prealbumin     Status: None   Collection Time: 12/26/14  5:26 PM  Result Value Ref Range   Prealbumin 25.6 18 - 38 mg/dL  Basic metabolic panel     Status: Abnormal   Collection Time: 12/27/14  4:42 AM  Result Value Ref Range   Sodium 138 135 - 145 mmol/L   Potassium 3.6 3.5 - 5.1 mmol/L   Chloride 104 101 - 111 mmol/L   CO2 25 22 - 32 mmol/L   Glucose, Bld 118 (H) 65 - 99 mg/dL   BUN 11 6 - 20 mg/dL   Creatinine, Ser 0.73 0.44 - 1.00 mg/dL   Calcium 8.9 8.9 - 10.3 mg/dL   GFR calc non Af Amer >60 >60 mL/min   GFR calc Af Amer >60 >60 mL/min   Anion gap 9 5 - 15  CBC     Status: None   Collection Time: 12/27/14  4:42 AM  Result Value Ref Range   WBC 6.8 4.0 - 10.5 K/uL   RBC 4.98 3.87 - 5.11 MIL/uL   Hemoglobin 14.3 12.0 - 15.0 g/dL   HCT 42.6 36.0 - 46.0 %   MCV 85.5 78.0 - 100.0 fL   MCH 28.7 26.0 - 34.0 pg   MCHC 33.6 30.0 - 36.0 g/dL   RDW 13.5 11.5 - 15.5 %   Platelets 253 150 - 400 K/uL    Signed: Shela Leff, MD 12/27/2014, 4:21 PM    Services Ordered on Discharge: None Equipment Ordered on Discharge: None

## 2014-12-28 LAB — ANGIOTENSIN CONVERTING ENZYME: ANGIOTENSIN-CONVERTING ENZYME: 99 U/L — AB (ref 14–82)

## 2014-12-30 ENCOUNTER — Other Ambulatory Visit: Payer: Self-pay | Admitting: Pulmonary Disease

## 2014-12-30 DIAGNOSIS — R918 Other nonspecific abnormal finding of lung field: Secondary | ICD-10-CM

## 2014-12-31 ENCOUNTER — Telehealth: Payer: Self-pay | Admitting: Pulmonary Disease

## 2014-12-31 NOTE — Telephone Encounter (Signed)
Called and spoke to pt. Pt has a HFU appt with Dr. Vaughan Browner on 9.22.16 after a PET scan on 9.20.16. Pt states she has noticed an increase in SOB, chest tightness, mild orthopnea, and back discomfort in between shoulder blades. Pt denies hemoptysis, f/c/s, n/v/d. Pt requesting a sooner appt d/t the recent increase in s/s. Appt made with Dr. Melvyn Novas on 9.14.16. Pt verbalized understanding and denied any further questions or concerns at this time.

## 2015-01-01 ENCOUNTER — Encounter: Payer: Self-pay | Admitting: Internal Medicine

## 2015-01-01 ENCOUNTER — Ambulatory Visit (INDEPENDENT_AMBULATORY_CARE_PROVIDER_SITE_OTHER): Payer: Self-pay | Admitting: Internal Medicine

## 2015-01-01 ENCOUNTER — Other Ambulatory Visit (INDEPENDENT_AMBULATORY_CARE_PROVIDER_SITE_OTHER): Payer: Self-pay

## 2015-01-01 VITALS — BP 128/82 | HR 64 | Ht 62.0 in | Wt 184.0 lb

## 2015-01-01 DIAGNOSIS — R0609 Other forms of dyspnea: Secondary | ICD-10-CM

## 2015-01-01 DIAGNOSIS — R918 Other nonspecific abnormal finding of lung field: Secondary | ICD-10-CM

## 2015-01-01 DIAGNOSIS — R0600 Dyspnea, unspecified: Secondary | ICD-10-CM

## 2015-01-01 LAB — CBC WITH DIFFERENTIAL/PLATELET
BASOS ABS: 0 10*3/uL (ref 0.0–0.1)
Basophils Relative: 0.4 % (ref 0.0–3.0)
EOS PCT: 2.6 % (ref 0.0–5.0)
Eosinophils Absolute: 0.2 10*3/uL (ref 0.0–0.7)
HEMATOCRIT: 45.2 % (ref 36.0–46.0)
Hemoglobin: 15.4 g/dL — ABNORMAL HIGH (ref 12.0–15.0)
LYMPHS PCT: 21.1 % (ref 12.0–46.0)
Lymphs Abs: 1.8 10*3/uL (ref 0.7–4.0)
MCHC: 34 g/dL (ref 30.0–36.0)
MCV: 83.9 fl (ref 78.0–100.0)
MONOS PCT: 7.2 % (ref 3.0–12.0)
Monocytes Absolute: 0.6 10*3/uL (ref 0.1–1.0)
Neutro Abs: 5.9 10*3/uL (ref 1.4–7.7)
Neutrophils Relative %: 68.7 % (ref 43.0–77.0)
Platelets: 280 10*3/uL (ref 150.0–400.0)
RBC: 5.39 Mil/uL — AB (ref 3.87–5.11)
RDW: 13.6 % (ref 11.5–15.5)
WBC: 8.5 10*3/uL (ref 4.0–10.5)

## 2015-01-01 LAB — SEDIMENTATION RATE: Sed Rate: 22 mm/hr (ref 0–22)

## 2015-01-01 MED ORDER — FAMOTIDINE 20 MG PO TABS: ORAL_TABLET | ORAL | Status: DC

## 2015-01-01 NOTE — Patient Instructions (Signed)
Stop all advil and aleve and take tylenol   pepcid 20 mg after bfast and after supper   GERD (REFLUX)  is an extremely common cause of respiratory symptoms just like yours , many times with no obvious heartburn at all.    It can be treated with medication, but also with lifestyle changes including elevation of the head of your bed (ideally with 6 inch  bed blocks),  Smoking cessation, avoidance of late meals, excessive alcohol, and avoid fatty foods, chocolate, peppermint, colas, red wine, and acidic juices such as orange juice.  NO MINT OR MENTHOL PRODUCTS SO NO COUGH DROPS  USE SUGARLESS CANDY INSTEAD (Jolley ranchers or Stover's or Life Savers) or even ice chips will also do - the key is to swallow to prevent all throat clearing. NO OIL BASED VITAMINS - use powdered substitutes.   Please remember to go to the lab  department downstairs for your tests - we will call you with the results when they are available.  Need to keep appt to get PET scan and be sure they send me a copy

## 2015-01-01 NOTE — Progress Notes (Signed)
Subjective:     Patient ID: Katrina [name], female   DOB: 02-19-1956,  MRN: [id number]  HPI   59 yowf never smoker with hx eos esophagitis and doe x 3 months then midline cp while off gerd rx    Date of Admission: 12/26/2014 11:43 AM Date of Discharge: 12/27/2014 Attending Physician: Annia Belt, MD  Discharge Diagnosis:  Active Problems:  Mediastinal lymphadenopathy  Lung mass  Discharge Medications:   Medication List    STOP taking these medications       lansoprazole 15 MG capsule  Commonly known as: PREVACID     omeprazole-sodium bicarbonate 40-1100 MG per capsule  Commonly known as: ZEGERID     sucralfate 1 GM/10ML suspension  Commonly known as: CARAFATE      TAKE these medications       cimetidine 200 MG tablet  Commonly known as: TAGAMET  Take 200 mg by mouth daily as needed (acid reflux).     levothyroxine 125 MCG tablet  Commonly known as: SYNTHROID, LEVOTHROID  Take 125 mcg by mouth daily before breakfast.        Disposition and follow-up:  [name] was discharged from [facility] in Stable condition. At the hospital follow up visit please address:  1. Mediastinal lymphadenopathy Patient is to follow up with Pulmonology (Dr. Vaughan Browner) as outpatient. His office will be calling the patient with an appointment date for PET scan and Bronchoscopy with Biopsy this week.   2. Labs / imaging needed at time of follow-up: PET scan, Bronchoscopy with Biopsy  3. Pending labs/ test needing follow-up: HIV antibody, Hep C antibody   Follow-up Appointments:     Follow-up Information    Follow up with Marshell Garfinkel, MD.   Specialty: Pulmonary Disease   Why: Pulmonology office will call you with an appointment date next week.    Contact information:   [address] [place] [address] [phone]       Discharge Instructions: Please follow up with  Pulmonology as outpatient. Dr. Matilde Bash office will call you next with an appointment date for PET scan and Bronchoscopy with biopsy. If you do not hear back from them by Tuesday 12/31/14, PLEASE CALL THEIR OFFICE TO SCHEDULE AN APPOINTMENT.   [facility]  [address] [place] [address] [phone]  If you have any problems contacting Pulmonology or have any questions, please do not hesitate to call Dr. Beryle Beams at [phone].  Consultations:   Pulmonology (Dr. Vaughan Browner)  Procedures Performed:   Imaging Results    Dg Chest 2 View  12/26/2014 CLINICAL DATA: Mid chest pain and shortness of breath. EXAM: CHEST 2 VIEW COMPARISON: Chest x-rays dated 11/08/2012 and 02/22/2011 FINDINGS: There is new cardiomegaly since the prior exams. The patient also has right hilar adenopathy with a possible mass at the inferior aspect of the right hilum. There is fullness in the azygos region. Density overlying the lingula on the PA view is most likely due to a cardiac lead electrode. No effusions. No osseous abnormality. IMPRESSION: New right hilar and mediastinal adenopathy. Probable mass at the inferior aspect of the right hilum. Findings are worrisome for malignancy. Electronically Signed By: Lorriane Shire M.D. On: 12/26/2014 12:43   Ct Soft Tissue Neck W Contrast  12/26/2014 CLINICAL DATA: Mediastinal and hilar lymphadenopathy. EXAM: CT NECK WITH CONTRAST TECHNIQUE: Multidetector CT imaging of the neck was performed using the standard protocol following the bolus administration of intravenous contrast. CONTRAST:  48mL OMNIPAQUE IOHEXOL 300 MG/ML SOLN COMPARISON: None. FINDINGS: Pharynx and larynx: Normal. Salivary glands: Normal. Thyroid: Atrophic. No nodules. Lymph nodes: Normal. No adenopathy in the neck. Vascular: Normal. Limited intracranial: Normal. Visualized orbits: Normal. Mastoids and visualized paranasal sinuses: Normal. Skeleton: Degenerative  disc disease at C5-6 and C6-7. Otherwise normal. Upper chest: 3.6 mm nodule in the posterior aspect of the right upper lobe on image 106 of series 201. This is visible on the prior chest CT. IMPRESSION: Negative CT scan of the neck. No pathologic adenopathy. Electronically Signed By: Lorriane Shire M.D. On: 12/26/2014 19:08   Ct Chest W Contrast  12/26/2014 CLINICAL DATA: New right hilar and mediastinal adenopathy seen on chest x-ray. Shortness of breath for 2 months. EXAM: CT CHEST WITH CONTRAST TECHNIQUE: Multidetector CT imaging of the chest was performed during intravenous contrast administration. CONTRAST: 15mL OMNIPAQUE IOHEXOL 300 MG/ML SOLN COMPARISON: Chest x-ray dated 12/26/2014 FINDINGS: There is no evidence of central pulmonary embolus. The heart and great vessels are normal in appearance. There is a mediastinal and bilateral hilar lymphadenopathy. Enlarged pretracheal an perivascular lymph nodes are seen within the mediastinum. The largest conglomerate of lymph nodes is seen in the right hilum and measures 3.3 x 2.2 cm. There is no evidence of significant focal lung parenchymal consolidation, pleural effusion or pneumothorax. There are however numerous bilateral subpleural and paraseptal pulmonary nodules. The index pulmonary nodule is located within the superior segment of left lower lobe and measures 1.2 x 0.7 by 0.7 cm. Numerous other subpleural soft tissue and ground-glass few mm nodules scattered throughout both lungs. There is a prominent lymph node within the anterior mediastinal pericardial fat. No axillary lymphadenopathy is seen. The visualized portions of the thyroid gland are unremarkable in appearance. The visualized portions of the liver and spleen are unremarkable. The visualized portions of the pancreas, gallbladder, stomach, adrenal glands and kidneys are within normal limits. There is a 3 mm sclerotic focus within the posterior aspect of T4 vertebral body.  IMPRESSION: Mediastinal and bilateral hilar lymphadenopathy. 1.2 cm soft tissue mass within superior segment of left lower lobe. Numerous other bilateral mostly subpleural soft tissue and ground-glass few mm pulmonary nodules. 3 mm sclerotic focus within T4 vertebral body. Overall appearance is suspicious for metastatic disease, or less likely primary lung malignancy. Alternatively, pulmonary sarcoidosis may have a similar appearance. Evaluation with PET-CT and/or tissue diagnosis may be considered. Electronically Signed By: Fidela Salisbury M.D. On: 12/26/2014 15:12   Ct Abdomen Pelvis W Contrast  12/26/2014 CLINICAL DATA: 59 year old female with mediastinal adenopathy seen on the chest CT performed earlier the liver, gallbladder, pancreas, spleen, adrenal glands, kidneys, visualized ureters, and urinary bladder appear unremarkable. Hysterectomy. Today. EXAM: CT ABDOMEN AND PELVIS WITH CONTRAST TECHNIQUE: Multidetector CT imaging of the abdomen and pelvis was performed using the standard protocol following bolus administration of intravenous contrast. CONTRAST: 53mL OMNIPAQUE IOHEXOL 300 MG/ML SOLN COMPARISON: CT dated 11/28/2013 FINDINGS: No intra-abdominal free air or free fluid. The liver, gallbladder, pancreas, spleen, adrenal glands, kidneys, visualized ureters, and urinary bladder appear unremarkable. Hysterectomy. There is irregular and thickened appearance of the wall of the cecum and proximal colon. Although this may be related to presence of residual fecal material the cecal mass is not excluded. Colonoscopy is recommended for better evaluation. There is no evidence of bowel obstruction. The appendix is unremarkable. The abdominal aorta and IVC are patent. A mildly enlarged lymph nodes noted in the region of porta hepatis. Top-normal peripancreatic and portacaval lymph nodes noted, nonspecific. Midline vertical  anterior pelvic wall incisional scar. There is mild degenerative  changes of the spine with no acute fracture. IMPRESSION: Apparent irregularity and thickening of the cecum and ascending colon possibly related to residual fecal content. A cecal mass is not excluded. Colonoscopy recommended for better evaluation. Mildly prominent lymph nodes in the porta hepaticus, nonspecific. No acute intra-abdominal or pelvic pathology identified. Electronically Signed By: Anner Crete M.D. On: 12/26/2014 19:12     Admission HPI: Patient is a 59 yo F with a PMHx of hypothyroidism, GERD, eosinophilic esophagitis presenting to [facility] with progressive SOB and chest pain for the past few weeks. States the pain in her chest is epigastric and substernal. Denies having any cough or sputum production. Patient has a history of acid reflux and currently taking Cimetidine. She was diagnosed with eosinophilic esophagitis in 0109 and biopsy at the time showed inflammation and presence of Schatzki ring. Patient is also complaining of dysphagia to both solids and liquids and hoarseness of voice. States she has noticed a mass on the right side of her neck for the past 1 year, which is painful sometimes. Also complaining of sore throat associated with right ear pain. States she went to see her dentist 3 weeks ago and they checked her for TMJ but she was not told anything about the neck mass. No history of smoking, alcohol, or drug use. Never used any chewing tobacco. States she has been feeling weak and tired for several months. Denies having any fevers or night sweats. Denies any loss of appetite or weight loss. States she noticed blood in her stool back in 06/2014 and was told by GI to get a colonoscopy done. However, due to lack of insurance she never got it done. Patient had a partial hysterectomy in 2002 due to history of fibroids. States her mother had blood cancer (myelodysplastic syndrome?) and died at the age of 45. States she was last sexually active 1 year ago, female  partner. As per patient, she was this partner for 8 years and he had sexual relations with other people. During physical exam, it was noted that the patient had a wart on the plantar surface of her foot.   Hospital Course by problem list: Active Problems:  Mediastinal lymphadenopathy  Lung mass   Mediastinal Lymphadenopathy Patient presented with progressive chest pain and SOB. CT showed Mediastinal and bilateral hilar lymphadenopathy.1.2 cm soft tissue mass within superior segment of left lower lobe. Numerous other bilateral mostly subpleural soft tissue and ground-glass few mm pulmonary nodules.3 mm sclerotic focus within T4 vertebral body. Overall appearance suspicious for metastatic disease, or less likely primary lung malignancy. CT of head and neck negative for any pathologic lymphadenopathy. Patient is a never smoker. She did not have external lymphadenopathhy on exam. ACE level high (99). Normal CBC and blood chemistry profile. Labs including LDH, Uric acid, LFTs, INR, aPTT , Mag, Phos, and Prealbumin normal. Scr 0.73. CT scan of the neck, abdomen, and pelvis was remarkable for a thickened wall of the cecum and proximal colon, however, the appearance of the pathology in the lung is not suggestive of metastatic colon cancer. Patient was seen by pulmonology (Dr. Vaughan Browner) and he suggested outpatient PET scan and bronchoscopy with biopsy. Patient remained clinically stable - vitals were stable and she was not hypoxic at rest. No evidence of superior vena cava syndrome. Pt is to follow up with Pulmonology as outpatient. Dr. Matilde Bash office will call her the week of 12/30/14 to schedule these procedures. Patient was  advised to call pulmonology office if she did not hear back from them by Tuesday 12/31/14. In addition, she was provided with our attending Dr. Azucena Freed cell phone number in case she had any questions or had trouble making an appointment with Pulmonology.   GERD -continued home med  Famotidine 20 mg qd   Hypothyroidism TSH normal.  -continued home med Synthroid 125 mcg qd         01/01/2015 1st [facility] office visit/ [place]   Chief Complaint  Patient presents with  . HFU    Pt states that she has had a HA every day since her hospital d/c. Her breathing has improved over the past 2 days. She states having pain in between her shoulder blades and chest tightness on and off.   sob is 24 /7 and just as bad walking as sitting assoc with epigastric / midline cp that rad to back assoc with dysphagia and can't take ppi (at least not omeprazole) s immediate swelling   No obvious day to day or daytime variability or assoc chronic cough or chest tightness, subjective wheeze or overt sinus or hb symptoms. No unusual exp hx or h/o childhood pna/ asthma or knowledge of premature birth.  Sleeping ok without nocturnal  or early am exacerbation  of respiratory  c/o's or need for noct saba. Also denies any obvious fluctuation of symptoms with weather or environmental changes or other aggravating or alleviating factors except as outlined above   Current Medications, Allergies, Complete Past Medical History, Past Surgical History, Family History, and Social History were reviewed in [employer] record.  ROS  The following are not active complaints unless bolded sore throat, dysphagia, dental problems, itching, sneezing,  nasal congestion or excess/ purulent secretions, ear ache,   fever, chills, sweats, unintended wt loss, classically pleuritic or exertional cp, hemoptysis,  orthopnea pnd or leg swelling, presyncope, palpitations, abdominal pain, anorexia, nausea, vomiting, diarrhea  or change in bowel or bladder habits, change in stools or urine, dysuria,hematuria,  rash, arthralgias, visual complaints, headache, numbness, weakness or ataxia or problems with walking or coordination,  change in mood/affect or memory.           Review of Systems      Objective:   Physical Exam    anxious amb wf nad   Wt Readings from Last 3 Encounters:  01/01/15 83.462 kg (184 lb)  12/26/14 84.2 kg (185 lb 10 oz)  12/02/14 84.511 kg (186 lb 5 oz)    Vital signs reviewed   HEENT: nl dentition, turbinates, and orophanx. Nl external ear canals without cough reflex   NECK :  without JVD/Nodes/TM/ nl carotid upstrokes bilaterally   LUNGS: no acc muscle use, clear to A and P bilaterally without cough on insp or exp maneuvers   CV:  RRR  no s3 or murmur or increase in P2, no edema   ABD:  soft and nontender with nl excursion in the supine position. No bruits or organomegaly, bowel sounds nl  MS:  warm without deformities, calf tenderness, cyanosis or clubbing  SKIN: warm and dry without lesions    NEURO:  alert, approp, no deficits     I personally reviewed images and agree with radiology impression as follows:  CT 12/26/14 Mediastinal and bilateral hilar lymphadenopathy.  1.2 cm soft tissue mass within superior segment of left lower lobe. Numerous other bilateral mostly subpleural soft tissue and ground-glass few mm pulmonary nodules.   01/01/2015 labs ordered ESR =  22 and CBC= nl    Assessment:

## 2015-01-02 ENCOUNTER — Telehealth: Payer: Self-pay | Admitting: Internal Medicine

## 2015-01-02 NOTE — Telephone Encounter (Signed)
Attempted to call pt No answer and could not leave voicemail Will try to call back later

## 2015-01-02 NOTE — Telephone Encounter (Signed)
atc pt, no answer, no vm set up.  Wcb.  

## 2015-01-02 NOTE — Telephone Encounter (Signed)
[phone] returning a call

## 2015-01-02 NOTE — Telephone Encounter (Signed)
Pt returned call  [phone]

## 2015-01-02 NOTE — Telephone Encounter (Signed)
Pt returning call - [phone]. She states she has phone with her & is not ringing for whatever reason. Will be keeping an eye on her phone.

## 2015-01-02 NOTE — Telephone Encounter (Signed)
Result Note     Call patient : Studies are unremarkable, esr nl and no anemia strongly against serious illness   ATC pt NA, VM not set up. WCB

## 2015-01-02 NOTE — Telephone Encounter (Signed)
ATC PT, NA, VM not set up [facility]

## 2015-01-03 NOTE — Telephone Encounter (Signed)
Pt returned call Reviewed results and recs.  Pt voiced understanding and had no further questions.

## 2015-01-05 ENCOUNTER — Encounter: Payer: Self-pay | Admitting: Internal Medicine

## 2015-01-05 NOTE — Assessment & Plan Note (Addendum)
-   01/01/2015  Walked RA x 3 laps @ 185 ft each stopped due to End of study, fast pace, no sob or desat    Symptoms are markedly disproportionate to objective findings and not clear this is a lung problem but pt does appear to have difficult airway management issues.  DDX of  difficult airways management all start with A and  include Adherence, Ace Inhibitors, Acid Reflux, Active Sinus Disease, Alpha 1 Antitripsin deficiency, Anxiety masquerading as Airways dz,  ABPA,  allergy(esp in young), Aspiration (esp in elderly), Adverse effects of meds,  Active smokers, A bunch of PE's (a small clot burden can't cause this syndrome unless there is already severe underlying pulm or vascular dz with poor reserve) plus two Bs  = Bronchiectasis and Beta blocker use..and one C= CHF  ? Acid (or non-acid) GERD > always difficult to exclude as up to 75% of pts in some series report no assoc GI/ Heartburn symptoms and she has it but can't take ppi > rec max (24h)  acid suppression with h2's  and diet restrictions/ reviewed and instructions given in writing.   ? Anxiety > dx of exclusion  I had an extended discussion with the patient reviewing all relevant studies completed to date and  lasting 15 to 20 minutes of a 25 minute visit    Each maintenance medication was reviewed in detail including most importantly the difference between maintenance and prns and under what circumstances the prns are to be triggered using an action plan format that is not reflected in the computer generated alphabetically organized AVS.    Please see instructions for details which were reviewed in writing and the patient given a copy highlighting the part that I personally wrote and discussed at today's ov.

## 2015-01-05 NOTE — Assessment & Plan Note (Addendum)
See CT chest 12/26/14 vs last pCxR 10/2012 and last PA and Lat view in 02/2011  - ACE level 99 on 12/27/14  - ESR 22  01/01/2015  - PET 01/07/15 >>> - EBUS scheduled for 9/28 /16   Most likely this is sarcoid in this never smoker  but still would not explain her symptoms which I believe are from undertreated GERD until proven otherwise.  Discussed in detail all the  indications, usual  risks and alternatives  relative to the benefits with patient who agrees to proceed with w/u as outlined

## 2015-01-07 ENCOUNTER — Ambulatory Visit (HOSPITAL_COMMUNITY)
Admission: RE | Admit: 2015-01-07 | Discharge: 2015-01-07 | Disposition: A | Discharge status: Home / Self Care | Payer: Self-pay | Source: Ambulatory Visit | Attending: Pulmonary Disease | Admitting: Pulmonary Disease

## 2015-01-07 DIAGNOSIS — R918 Other nonspecific abnormal finding of lung field: Secondary | ICD-10-CM | POA: Insufficient documentation

## 2015-01-07 DIAGNOSIS — R590 Localized enlarged lymph nodes: Secondary | ICD-10-CM | POA: Insufficient documentation

## 2015-01-07 LAB — GLUCOSE, CAPILLARY: Glucose-Capillary: 112 mg/dL — ABNORMAL HIGH (ref 65–99)

## 2015-01-07 LAB — HEPATITIS C ANTIBODY (REFLEX): HCV AB: NEGATIVE

## 2015-01-07 LAB — HIV ANTIBODY (ROUTINE TESTING W REFLEX): HIV Screen 4th Generation wRfx: NEGATIVE

## 2015-01-07 MED ORDER — FLUDEOXYGLUCOSE F - 18 (FDG) INJECTION
9.1200 | Freq: Once | INTRAVENOUS | Status: DC | PRN
  Administered 2015-01-07: 9.12 via INTRAVENOUS
  Filled 2015-01-07: qty 9.12

## 2015-01-08 ENCOUNTER — Inpatient Hospital Stay (HOSPITAL_COMMUNITY)
Admission: RE | Admit: 2015-01-08 | Discharge: 2015-01-08 | Disposition: A | Discharge status: Home / Self Care | Payer: Self-pay | Source: Ambulatory Visit

## 2015-01-08 NOTE — Progress Notes (Signed)
ATC patient for PAT appt (was at 1400) and she states that her doctor has not decided for sure whether she is having the procedure or not...therefore is not coming today.  Will give to [facility] for rescheduling.

## 2015-01-09 ENCOUNTER — Telehealth: Payer: Self-pay | Admitting: Internal Medicine

## 2015-01-09 ENCOUNTER — Institutional Professional Consult (permissible substitution): Payer: Self-pay | Admitting: Pulmonary Disease

## 2015-01-09 NOTE — Telephone Encounter (Signed)
Told her there were no surpises on PET and next step is ebus to get tissue dx  Breathing is better with GERD rx/ diet  Wants to know more about procedure > forward to Dr Vaughan Browner

## 2015-01-09 NOTE — Telephone Encounter (Signed)
Pt had PET done 9/20. Requesting results. Please advise thanks

## 2015-01-10 NOTE — Telephone Encounter (Signed)
I spoke with the patient and updated about the upcoming EBUS procedure.

## 2015-01-13 ENCOUNTER — Encounter (HOSPITAL_COMMUNITY): Payer: Self-pay

## 2015-01-13 ENCOUNTER — Encounter (HOSPITAL_COMMUNITY)
Admission: RE | Admit: 2015-01-13 | Discharge: 2015-01-13 | Disposition: A | Discharge status: Home / Self Care | Payer: Self-pay | Source: Ambulatory Visit | Attending: Emergency Medicine | Admitting: Emergency Medicine

## 2015-01-13 DIAGNOSIS — R918 Other nonspecific abnormal finding of lung field: Secondary | ICD-10-CM | POA: Insufficient documentation

## 2015-01-13 DIAGNOSIS — Z01812 Encounter for preprocedural laboratory examination: Secondary | ICD-10-CM | POA: Insufficient documentation

## 2015-01-13 LAB — CBC
HCT: 46.1 % — ABNORMAL HIGH (ref 36.0–46.0)
Hemoglobin: 15.4 g/dL — ABNORMAL HIGH (ref 12.0–15.0)
MCH: 28.7 pg (ref 26.0–34.0)
MCHC: 33.4 g/dL (ref 30.0–36.0)
MCV: 86 fL (ref 78.0–100.0)
PLATELETS: 222 10*3/uL (ref 150–400)
RBC: 5.36 MIL/uL — AB (ref 3.87–5.11)
RDW: 13.2 % (ref 11.5–15.5)
WBC: 6.9 10*3/uL (ref 4.0–10.5)

## 2015-01-13 LAB — PROTIME-INR
INR: 1.1 (ref 0.00–1.49)
Prothrombin Time: 14.4 seconds (ref 11.6–15.2)

## 2015-01-13 LAB — COMPREHENSIVE METABOLIC PANEL
ALBUMIN: 3.8 g/dL (ref 3.5–5.0)
ALT: 30 U/L (ref 14–54)
AST: 25 U/L (ref 15–41)
Alkaline Phosphatase: 76 U/L (ref 38–126)
Anion gap: 8 (ref 5–15)
BUN: 10 mg/dL (ref 6–20)
CHLORIDE: 103 mmol/L (ref 101–111)
CO2: 29 mmol/L (ref 22–32)
CREATININE: 0.8 mg/dL (ref 0.44–1.00)
Calcium: 9.4 mg/dL (ref 8.9–10.3)
GFR calc Af Amer: >60 mL/min (ref >60)
GFR calc non Af Amer: >60 mL/min (ref >60)
GLUCOSE: 125 mg/dL — AB (ref 65–99)
POTASSIUM: 4.2 mmol/L (ref 3.5–5.1)
SODIUM: 140 mmol/L (ref 135–145)
Total Bilirubin: 0.8 mg/dL (ref 0.3–1.2)
Total Protein: 6.9 g/dL (ref 6.5–8.1)

## 2015-01-13 LAB — APTT: APTT: 32 s (ref 24–37)

## 2015-01-13 NOTE — Pre-Procedure Instructions (Signed)
    Katrina [name]  01/13/2015      [id number], [place] [address] Phone: [phone] Fax: [phone]    Your procedure is scheduled on January 15, 2015.  Report to [facility] Admitting at 6:30 A.M.  Call this number if you have problems the morning of surgery:  [phone]   Remember:  Do not eat food or drink liquids after midnight.  Take these medicines the morning of surgery with A SIP OF WATER : cimetidine (TAGAMET), levothyroxine  (SYNTHROID, LEVOTHROID)    STOP ASPIRIN, PRODUCTS CONTAINING ASPIRIN (BC'S, GOODY POWDER), HERBAL MEDICATIONS ONE  WEEK PRIOR TO SURGERY   Do not wear jewelry, make-up or nail polish.  Do not wear lotions, powders, or perfumes.  You may wear deodorant.  Do not shave 48 hours prior to surgery.   Do not bring valuables to the hospital.  [facility] is not responsible for any belongings or valuables.  Contacts, dentures or bridgework may not be worn into surgery.     Patients discharged the day of surgery will not be allowed to drive home.   Name and phone number of your driver:    Special instructions:  "PREPARING FOR SURGERY"  Please read over the following fact sheets that you were given. Pain Booklet, Coughing and Deep Breathing and Surgical Site Infection Prevention

## 2015-01-15 ENCOUNTER — Ambulatory Visit (HOSPITAL_COMMUNITY)
Admission: RE | Admit: 2015-01-15 | Discharge: 2015-01-15 | Disposition: A | Discharge status: Home / Self Care | Payer: Self-pay | Source: Ambulatory Visit | Attending: Emergency Medicine | Admitting: Emergency Medicine

## 2015-01-15 ENCOUNTER — Encounter (HOSPITAL_COMMUNITY)
Admission: RE | Disposition: A | Discharge status: Home / Self Care | Payer: Self-pay | Source: Ambulatory Visit | Attending: Emergency Medicine

## 2015-01-15 ENCOUNTER — Ambulatory Visit (HOSPITAL_COMMUNITY): Payer: Self-pay

## 2015-01-15 ENCOUNTER — Ambulatory Visit (HOSPITAL_COMMUNITY): Payer: Self-pay | Admitting: Anesthesiology

## 2015-01-15 ENCOUNTER — Ambulatory Visit (HOSPITAL_COMMUNITY): Payer: MEDICAID | Admitting: Anesthesiology

## 2015-01-15 ENCOUNTER — Encounter (HOSPITAL_COMMUNITY): Payer: Self-pay | Admitting: *Deleted

## 2015-01-15 DIAGNOSIS — R599 Enlarged lymph nodes, unspecified: Secondary | ICD-10-CM

## 2015-01-15 DIAGNOSIS — E039 Hypothyroidism, unspecified: Secondary | ICD-10-CM | POA: Insufficient documentation

## 2015-01-15 DIAGNOSIS — R590 Localized enlarged lymph nodes: Secondary | ICD-10-CM | POA: Insufficient documentation

## 2015-01-15 DIAGNOSIS — Z79899 Other long term (current) drug therapy: Secondary | ICD-10-CM | POA: Insufficient documentation

## 2015-01-15 DIAGNOSIS — R918 Other nonspecific abnormal finding of lung field: Secondary | ICD-10-CM | POA: Insufficient documentation

## 2015-01-15 DIAGNOSIS — Z9889 Other specified postprocedural states: Secondary | ICD-10-CM

## 2015-01-15 DIAGNOSIS — K219 Gastro-esophageal reflux disease without esophagitis: Secondary | ICD-10-CM | POA: Insufficient documentation

## 2015-01-15 HISTORY — PX: VIDEO BRONCHOSCOPY WITH ENDOBRONCHIAL ULTRASOUND: SHX6177

## 2015-01-15 SURGERY — BRONCHOSCOPY, WITH EBUS
Anesthesia: General | Site: Chest | Laterality: Bilateral

## 2015-01-15 MED ORDER — LIDOCAINE HCL 4 % MT SOLN
OROMUCOSAL | Status: DC | PRN
  Administered 2015-01-15: 4 mL via TOPICAL

## 2015-01-15 MED ORDER — LIDOCAINE HCL (CARDIAC) 20 MG/ML IV SOLN
INTRAVENOUS | Status: AC
  Filled 2015-01-15: qty 5

## 2015-01-15 MED ORDER — MIDAZOLAM HCL 5 MG/5ML IJ SOLN
INTRAMUSCULAR | Status: DC | PRN
  Administered 2015-01-15: 2 mg via INTRAVENOUS

## 2015-01-15 MED ORDER — LACTATED RINGERS IV SOLN
INTRAVENOUS | Status: DC | PRN
  Administered 2015-01-15 (×2): via INTRAVENOUS

## 2015-01-15 MED ORDER — FENTANYL CITRATE (PF) 250 MCG/5ML IJ SOLN
INTRAMUSCULAR | Status: AC
  Filled 2015-01-15: qty 5

## 2015-01-15 MED ORDER — PROPOFOL 10 MG/ML IV BOLUS
INTRAVENOUS | Status: DC | PRN
  Administered 2015-01-15: 160 mg via INTRAVENOUS

## 2015-01-15 MED ORDER — ONDANSETRON HCL 4 MG/2ML IJ SOLN
INTRAMUSCULAR | Status: DC | PRN
  Administered 2015-01-15 (×2): 4 mg via INTRAVENOUS

## 2015-01-15 MED ORDER — 0.9 % SODIUM CHLORIDE (POUR BTL) OPTIME
TOPICAL | Status: DC | PRN
  Administered 2015-01-15: 1000 mL

## 2015-01-15 MED ORDER — PROMETHAZINE HCL 25 MG/ML IJ SOLN: 6.2500 mg | INTRAMUSCULAR | Status: DC | PRN

## 2015-01-15 MED ORDER — PROPOFOL 10 MG/ML IV BOLUS
INTRAVENOUS | Status: AC
  Filled 2015-01-15: qty 20

## 2015-01-15 MED ORDER — ROCURONIUM BROMIDE 50 MG/5ML IV SOLN
INTRAVENOUS | Status: AC
  Filled 2015-01-15: qty 1

## 2015-01-15 MED ORDER — EPHEDRINE SULFATE 50 MG/ML IJ SOLN
INTRAMUSCULAR | Status: DC | PRN
  Administered 2015-01-15: 10 mg via INTRAVENOUS

## 2015-01-15 MED ORDER — GLYCOPYRROLATE 0.2 MG/ML IJ SOLN
INTRAMUSCULAR | Status: DC | PRN
  Administered 2015-01-15: 0.2 mg via INTRAVENOUS
  Administered 2015-01-15: 0.6 mg via INTRAVENOUS

## 2015-01-15 MED ORDER — MIDAZOLAM HCL 2 MG/2ML IJ SOLN
INTRAMUSCULAR | Status: AC
  Filled 2015-01-15: qty 4

## 2015-01-15 MED ORDER — PHENYLEPHRINE HCL 10 MG/ML IJ SOLN
INTRAMUSCULAR | Status: DC | PRN
  Administered 2015-01-15 (×3): 80 ug via INTRAVENOUS

## 2015-01-15 MED ORDER — FENTANYL CITRATE (PF) 100 MCG/2ML IJ SOLN
INTRAMUSCULAR | Status: DC | PRN
  Administered 2015-01-15: 150 ug via INTRAVENOUS
  Administered 2015-01-15: 50 ug via INTRAVENOUS

## 2015-01-15 MED ORDER — ROCURONIUM BROMIDE 100 MG/10ML IV SOLN
INTRAVENOUS | Status: DC | PRN
  Administered 2015-01-15: 10 mg via INTRAVENOUS
  Administered 2015-01-15: 5 mg via INTRAVENOUS
  Administered 2015-01-15: 15 mg via INTRAVENOUS
  Administered 2015-01-15: 40 mg via INTRAVENOUS

## 2015-01-15 MED ORDER — DEXAMETHASONE SODIUM PHOSPHATE 4 MG/ML IJ SOLN
INTRAMUSCULAR | Status: DC | PRN
  Administered 2015-01-15: 8 mg via INTRAVENOUS

## 2015-01-15 MED ORDER — LIDOCAINE HCL (CARDIAC) 20 MG/ML IV SOLN
INTRAVENOUS | Status: DC | PRN
  Administered 2015-01-15: 100 mg via INTRAVENOUS

## 2015-01-15 MED ORDER — ONDANSETRON HCL 4 MG/2ML IJ SOLN
INTRAMUSCULAR | Status: AC
  Filled 2015-01-15: qty 2

## 2015-01-15 MED ORDER — NEOSTIGMINE METHYLSULFATE 10 MG/10ML IV SOLN
INTRAVENOUS | Status: DC | PRN
  Administered 2015-01-15: 4 mg via INTRAVENOUS

## 2015-01-15 MED ORDER — DEXAMETHASONE SODIUM PHOSPHATE 4 MG/ML IJ SOLN
INTRAMUSCULAR | Status: AC
  Filled 2015-01-15: qty 2

## 2015-01-15 SURGICAL SUPPLY — 30 items
BRUSH CYTOL CELLEBRITY 1.5X140 (MISCELLANEOUS) IMPLANT
CANISTER SUCTION 2500CC (MISCELLANEOUS) ×2 IMPLANT
CONT SPEC 4OZ CLIKSEAL STRL BL (MISCELLANEOUS) ×3 IMPLANT
COVER DOME SNAP 22 D (MISCELLANEOUS) ×2 IMPLANT
COVER TABLE BACK 60X90 (DRAPES) ×2 IMPLANT
FORCEPS BIOP RJ4 1.8 (CUTTING FORCEPS) IMPLANT
GAUZE SPONGE 4X4 12PLY STRL (GAUZE/BANDAGES/DRESSINGS) IMPLANT
GLOVE BIO SURGEON STRL SZ7.5 (GLOVE) ×4 IMPLANT
GLOVE BIOGEL PI IND STRL 6.5 (GLOVE) IMPLANT
GLOVE BIOGEL PI INDICATOR 6.5 (GLOVE) ×1
GLOVE SURG SS PI 6.5 STRL IVOR (GLOVE) ×2 IMPLANT
GOWN STRL REUS W/ TWL LRG LVL3 (GOWN DISPOSABLE) ×1 IMPLANT
GOWN STRL REUS W/TWL LRG LVL3 (GOWN DISPOSABLE) ×4
KIT CLEAN ENDO COMPLIANCE (KITS) ×2 IMPLANT
KIT ROOM TURNOVER OR (KITS) ×2 IMPLANT
MARKER SKIN DUAL TIP RULER LAB (MISCELLANEOUS) ×2 IMPLANT
NEEDLE BIOPSY TRANSBRONCH 21G (NEEDLE) IMPLANT
NEEDLE SONO TIP II EBUS (NEEDLE) ×2 IMPLANT
NS IRRIG 1000ML POUR BTL (IV SOLUTION) ×2 IMPLANT
OIL SILICONE PENTAX (PARTS (SERVICE/REPAIRS)) IMPLANT
PAD ARMBOARD 7.5X6 YLW CONV (MISCELLANEOUS) ×4 IMPLANT
SYR 20CC LL (SYRINGE) ×4 IMPLANT
SYR 20ML ECCENTRIC (SYRINGE) ×3 IMPLANT
SYR 50ML LL SCALE MARK (SYRINGE) ×1 IMPLANT
SYR 50ML SLIP (SYRINGE) ×2 IMPLANT
SYR 5ML LUER SLIP (SYRINGE) IMPLANT
TOWEL OR 17X24 6PK STRL BLUE (TOWEL DISPOSABLE) IMPLANT
TOWEL OR 17X26 10 PK STRL BLUE (TOWEL DISPOSABLE) ×2 IMPLANT
TRAP SPECIMEN MUCOUS 40CC (MISCELLANEOUS) ×2 IMPLANT
TUBE CONNECTING 20X1/4 (TUBING) ×2 IMPLANT

## 2015-01-15 NOTE — Progress Notes (Signed)
Dr byrum in to see post procedure

## 2015-01-15 NOTE — Op Note (Signed)
Video Bronchoscopy with Endobronchial Ultrasound Procedure Note  Date of Operation: 01/15/2015  Pre-op Diagnosis: hilar and mediastinal adenopathy  Post-op Diagnosis: same  Surgeon: Baltazar Apo  Assistants: Dr Merri Ray  Anesthesia: General endotracheal anesthesia  Operation: Flexible video fiberoptic bronchoscopy with endobronchial ultrasound and biopsies.  Estimated Blood Loss: Minimal  Complications: none apparent  Indications and History: Katrina [name] is a 59 y.o. female with CP and a CT scan with nodular disease and hilar / mediastinal LAD. Recommendation was made to pursue nodal biopsies via EBUS.  The risks, benefits, complications, treatment options and expected outcomes were discussed with the patient.  The possibilities of pneumothorax, pneumonia, reaction to medication, pulmonary aspiration, perforation of a viscus, bleeding, failure to diagnose a condition and creating a complication requiring transfusion or operation were discussed with the patient who freely signed the consent.    Description of Procedure: The patient was examined in the preoperative area and history and data from the preprocedure consultation were reviewed. It was deemed appropriate to proceed.  The patient was taken to OR 10, identified as Katrina [name] and the procedure verified as Flexible Video Fiberoptic Bronchoscopy.  A Time Out was held and the above information confirmed. After being taken to the operating room general anesthesia was initiated and the patient  was orally intubated. The video fiberoptic bronchoscope was introduced via the endotracheal tube and a general inspection was performed which showed normal airways throughout, no evidence of endobronchial lesion or abnormal secretions. The standard scope was then withdrawn and the endobronchial ultrasound was used to identify and characterize the peritracheal, hilar and bronchial lymph nodes. Inspection showed enlargement of 2R, 4R, 7, 11R,  12R nodes. On the left side the 4 illness and was small and inaccessible. There was enlargement of 10L nodes beneath station 7 that were accessible. In the region of the left upper lobe carina there was an irregularly shaped region of hypodensity that was labeled as 11L but which had the appearance of possible hilar mass. A 12L node was also accessible at the superior segmental bronchus. Using real-time ultrasound guidance Wang needle biopsies were take from Station 11L, 12L, 7, 10L, 4R and 11R nodes (in that order) and were sent for cytology. Station 7 was sampled with it's own separate needle. A sample from the 11R node was also prepared for AFB and fungal staining. The patient tolerated the procedure well without apparent complications. There was no significant blood loss. The standard bronchoscope was reintroduced and a bronchoalveolar lavage was performed in the left upper lobe with 60 mL of normal saline instilled and approximately 24 mL returned. This will be sent for microbiology. The bronchoscope was withdrawn. Anesthesia was reversed and the patient was taken to the PACU for recovery.   Samples: 1. Wang needle biopsies from 11L node 2. Wang needle biopsies from 12L node 3. Wang needle biopsies from 10L node 4. Wang needle biopsies from 7 node 5. Wang needle biopsies from 4R node 6. Wang needle biopsies from 411R node 7. Bronchoalveolar lavage from the left upper lobe  Plans:  The patient will be discharged from the PACU to home when recovered from anesthesia. We will review the cytology, pathology and microbiology results with the patient when they become available.   Baltazar Apo, MD, PhD 01/15/2015, 10:48 AM  Pulmonary and Critical Care [phone] or if no answer [phone]

## 2015-01-15 NOTE — Discharge Instructions (Signed)
Flexible Bronchoscopy, Care After °These instructions give you information on caring for yourself after your procedure. Your doctor may also give you more specific instructions. Call your doctor if you have any problems or questions after your procedure. °HOME CARE °· Do not eat or drink anything for 2 hours after your procedure. If you try to eat or drink before the medicine wears off, food or drink could go into your lungs. You could also burn yourself. °· After 2 hours have passed and when you can cough and gag normally, you may eat soft food and drink liquids slowly. °· The day after the test, you may eat your normal diet. °· You may do your normal activities. °· Keep all doctor visits. °GET HELP RIGHT AWAY IF: °· You get more and more short of breath. °· You get light-headed. °· You feel like you are going to pass out (faint). °· You have chest pain. °· You have new problems that worry you. °· You cough up more than a little blood. °· You cough up more blood than before. °MAKE SURE YOU: °· Understand these instructions. °· Will watch your condition. °· Will get help right away if you are not doing well or get worse. °Document Released: 01/31/2009 Document Revised: 04/10/2013 Document Reviewed: 12/08/2012 °[employer]® Patient Information ©2015 [employer]. This information is not intended to replace advice given to you by your health care provider. Make sure you discuss any questions you have with your health care provider. ° °Please call our office for any questions or concerns. [phone].  ° ° ° °

## 2015-01-15 NOTE — Interval H&P Note (Signed)
PCCM interval Note  Pt presents fopr further eval of pulmonary nodules and Hypermetabolic paratracheal, subcarinal, AP window, hilar, infrahilar adenopathy. She reports no new sx, has started no new meds, etc. Her daughter is here with her.   Filed Vitals:   01/15/15 0719  BP: 144/82  Pulse: 72  Temp: 97.7 F (36.5 C)  TempSrc: Oral  Resp: 18  Weight: 83.689 kg (184 lb 8 oz)  SpO2: 95%   Gen: Pleasant, well-nourished, in no distress,  normal affect  ENT: No lesions,  mouth clear,  oropharynx clear, no postnasal drip  Neck: No JVD, no TMG, no carotid bruits  Lungs: No use of accessory muscles, clear without rales or rhonchi  Cardiovascular: RRR, heart sounds normal, no murmur or gallops  Musculoskeletal: No deformities, no cyanosis or clubbing  Neuro: alert, non focal  Skin: Warm, no lesions or rashes   Recent Labs Lab 01/13/15 1150  HGB 15.4*  HCT 46.1*  WBC 6.9  PLT 222    Recent Labs Lab 01/13/15 1150  NA 140  K 4.2  CL 103  CO2 29  GLUCOSE 125*  BUN 10  CREATININE 0.80  CALCIUM 9.4    Recent Labs Lab 01/13/15 1150  INR 1.10   Plans:  FOB with EBUS and nodal biopsies this am. Pt understands the plans and is willing to proceed.   Baltazar Apo, MD, PhD 01/15/2015, 8:25 AM [facility] [phone] or if no answer [phone]

## 2015-01-15 NOTE — H&P (View-Only) (Signed)
Subjective:     Patient ID: Katrina [name], female   DOB: 1956-02-24,  MRN: [id number]  HPI   59 yowf never smoker with hx eos esophagitis and doe x 3 months then midline cp while off gerd rx    Date of Admission: 12/26/2014 11:43 AM Date of Discharge: 12/27/2014 Attending Physician: Annia Belt, MD  Discharge Diagnosis:  Active Problems:  Mediastinal lymphadenopathy  Lung mass  Discharge Medications:   Medication List    STOP taking these medications       lansoprazole 15 MG capsule  Commonly known as: PREVACID     omeprazole-sodium bicarbonate 40-1100 MG per capsule  Commonly known as: ZEGERID     sucralfate 1 GM/10ML suspension  Commonly known as: CARAFATE      TAKE these medications       cimetidine 200 MG tablet  Commonly known as: TAGAMET  Take 200 mg by mouth daily as needed (acid reflux).     levothyroxine 125 MCG tablet  Commonly known as: SYNTHROID, LEVOTHROID  Take 125 mcg by mouth daily before breakfast.        Disposition and follow-up:  Katrina [name] was discharged from [facility] in Stable condition. At the hospital follow up visit please address:  1. Mediastinal lymphadenopathy Patient is to follow up with Pulmonology (Dr. Vaughan Browner) as outpatient. His office will be calling the patient with an appointment date for PET scan and Bronchoscopy with Biopsy this week.   2. Labs / imaging needed at time of follow-up: PET scan, Bronchoscopy with Biopsy  3. Pending labs/ test needing follow-up: HIV antibody, Hep C antibody   Follow-up Appointments:     Follow-up Information    Follow up with Marshell Garfinkel, MD.   Specialty: Pulmonary Disease   Why: Pulmonology office will call you with an appointment date next week.    Contact information:   [address] [place] [address] [phone]       Discharge Instructions: Please follow up with  Pulmonology as outpatient. Dr. Matilde Bash office will call you next with an appointment date for PET scan and Bronchoscopy with biopsy. If you do not hear back from them by Tuesday 12/31/14, PLEASE CALL THEIR OFFICE TO SCHEDULE AN APPOINTMENT.   [facility]  [address] [place] [address] [phone]  If you have any problems contacting Pulmonology or have any questions, please do not hesitate to call Dr. Beryle Beams at [phone].  Consultations:   Pulmonology (Dr. Vaughan Browner)  Procedures Performed:   Imaging Results    Dg Chest 2 View  12/26/2014 CLINICAL DATA: Mid chest pain and shortness of breath. EXAM: CHEST 2 VIEW COMPARISON: Chest x-rays dated 11/08/2012 and 02/22/2011 FINDINGS: There is new cardiomegaly since the prior exams. The patient also has right hilar adenopathy with a possible mass at the inferior aspect of the right hilum. There is fullness in the azygos region. Density overlying the lingula on the PA view is most likely due to a cardiac lead electrode. No effusions. No osseous abnormality. IMPRESSION: New right hilar and mediastinal adenopathy. Probable mass at the inferior aspect of the right hilum. Findings are worrisome for malignancy. Electronically Signed By: Lorriane Shire M.D. On: 12/26/2014 12:43   Ct Soft Tissue Neck W Contrast  12/26/2014 CLINICAL DATA: Mediastinal and hilar lymphadenopathy. EXAM: CT NECK WITH CONTRAST TECHNIQUE: Multidetector CT imaging of the neck was performed using the standard protocol following the bolus administration of intravenous contrast. CONTRAST:  23mL OMNIPAQUE IOHEXOL 300 MG/ML SOLN COMPARISON: None. FINDINGS: Pharynx and larynx: Normal. Salivary glands: Normal. Thyroid: Atrophic. No nodules. Lymph nodes: Normal. No adenopathy in the neck. Vascular: Normal. Limited intracranial: Normal. Visualized orbits: Normal. Mastoids and visualized paranasal sinuses: Normal. Skeleton: Degenerative  disc disease at C5-6 and C6-7. Otherwise normal. Upper chest: 3.6 mm nodule in the posterior aspect of the right upper lobe on image 106 of series 201. This is visible on the prior chest CT. IMPRESSION: Negative CT scan of the neck. No pathologic adenopathy. Electronically Signed By: Lorriane Shire M.D. On: 12/26/2014 19:08   Ct Chest W Contrast  12/26/2014 CLINICAL DATA: New right hilar and mediastinal adenopathy seen on chest x-ray. Shortness of breath for 2 months. EXAM: CT CHEST WITH CONTRAST TECHNIQUE: Multidetector CT imaging of the chest was performed during intravenous contrast administration. CONTRAST: 66mL OMNIPAQUE IOHEXOL 300 MG/ML SOLN COMPARISON: Chest x-ray dated 12/26/2014 FINDINGS: There is no evidence of central pulmonary embolus. The heart and great vessels are normal in appearance. There is a mediastinal and bilateral hilar lymphadenopathy. Enlarged pretracheal an perivascular lymph nodes are seen within the mediastinum. The largest conglomerate of lymph nodes is seen in the right hilum and measures 3.3 x 2.2 cm. There is no evidence of significant focal lung parenchymal consolidation, pleural effusion or pneumothorax. There are however numerous bilateral subpleural and paraseptal pulmonary nodules. The index pulmonary nodule is located within the superior segment of left lower lobe and measures 1.2 x 0.7 by 0.7 cm. Numerous other subpleural soft tissue and ground-glass few mm nodules scattered throughout both lungs. There is a prominent lymph node within the anterior mediastinal pericardial fat. No axillary lymphadenopathy is seen. The visualized portions of the thyroid gland are unremarkable in appearance. The visualized portions of the liver and spleen are unremarkable. The visualized portions of the pancreas, gallbladder, stomach, adrenal glands and kidneys are within normal limits. There is a 3 mm sclerotic focus within the posterior aspect of T4 vertebral body.  IMPRESSION: Mediastinal and bilateral hilar lymphadenopathy. 1.2 cm soft tissue mass within superior segment of left lower lobe. Numerous other bilateral mostly subpleural soft tissue and ground-glass few mm pulmonary nodules. 3 mm sclerotic focus within T4 vertebral body. Overall appearance is suspicious for metastatic disease, or less likely primary lung malignancy. Alternatively, pulmonary sarcoidosis may have a similar appearance. Evaluation with PET-CT and/or tissue diagnosis may be considered. Electronically Signed By: Fidela Salisbury M.D. On: 12/26/2014 15:12   Ct Abdomen Pelvis W Contrast  12/26/2014 CLINICAL DATA: 58 year old female with mediastinal adenopathy seen on the chest CT performed earlier the liver, gallbladder, pancreas, spleen, adrenal glands, kidneys, visualized ureters, and urinary bladder appear unremarkable. Hysterectomy. Today. EXAM: CT ABDOMEN AND PELVIS WITH CONTRAST TECHNIQUE: Multidetector CT imaging of the abdomen and pelvis was performed using the standard protocol following bolus administration of intravenous contrast. CONTRAST: 72mL OMNIPAQUE IOHEXOL 300 MG/ML SOLN COMPARISON: CT dated 11/28/2013 FINDINGS: No intra-abdominal free air or free fluid. The liver, gallbladder, pancreas, spleen, adrenal glands, kidneys, visualized ureters, and urinary bladder appear unremarkable. Hysterectomy. There is irregular and thickened appearance of the wall of the cecum and proximal colon. Although this may be related to presence of residual fecal material the cecal mass is not excluded. Colonoscopy is recommended for better evaluation. There is no evidence of bowel obstruction. The appendix is unremarkable. The abdominal aorta and IVC are patent. A mildly enlarged lymph nodes noted in the region of porta hepatis. Top-normal peripancreatic and portacaval lymph nodes noted, nonspecific. Midline vertical  anterior pelvic wall incisional scar. There is mild degenerative  changes of the spine with no acute fracture. IMPRESSION: Apparent irregularity and thickening of the cecum and ascending colon possibly related to residual fecal content. A cecal mass is not excluded. Colonoscopy recommended for better evaluation. Mildly prominent lymph nodes in the porta hepaticus, nonspecific. No acute intra-abdominal or pelvic pathology identified. Electronically Signed By: Anner Crete M.D. On: 12/26/2014 19:12     Admission HPI: Patient is a 59 yo F with a PMHx of hypothyroidism, GERD, eosinophilic esophagitis presenting to [facility] with progressive SOB and chest pain for the past few weeks. States the pain in her chest is epigastric and substernal. Denies having any cough or sputum production. Patient has a history of acid reflux and currently taking Cimetidine. She was diagnosed with eosinophilic esophagitis in 9924 and biopsy at the time showed inflammation and presence of Schatzki ring. Patient is also complaining of dysphagia to both solids and liquids and hoarseness of voice. States she has noticed a mass on the right side of her neck for the past 1 year, which is painful sometimes. Also complaining of sore throat associated with right ear pain. States she went to see her dentist 3 weeks ago and they checked her for TMJ but she was not told anything about the neck mass. No history of smoking, alcohol, or drug use. Never used any chewing tobacco. States she has been feeling weak and tired for several months. Denies having any fevers or night sweats. Denies any loss of appetite or weight loss. States she noticed blood in her stool back in 06/2014 and was told by GI to get a colonoscopy done. However, due to lack of insurance she never got it done. Patient had a partial hysterectomy in 2002 due to history of fibroids. States her mother had blood cancer (myelodysplastic syndrome?) and died at the age of 25. States she was last sexually active 1 year ago, female  partner. As per patient, she was this partner for 8 years and he had sexual relations with other people. During physical exam, it was noted that the patient had a wart on the plantar surface of her foot.   Hospital Course by problem list: Active Problems:  Mediastinal lymphadenopathy  Lung mass   Mediastinal Lymphadenopathy Patient presented with progressive chest pain and SOB. CT showed Mediastinal and bilateral hilar lymphadenopathy.1.2 cm soft tissue mass within superior segment of left lower lobe. Numerous other bilateral mostly subpleural soft tissue and ground-glass few mm pulmonary nodules.3 mm sclerotic focus within T4 vertebral body. Overall appearance suspicious for metastatic disease, or less likely primary lung malignancy. CT of head and neck negative for any pathologic lymphadenopathy. Patient is a never smoker. She did not have external lymphadenopathhy on exam. ACE level high (99). Normal CBC and blood chemistry profile. Labs including LDH, Uric acid, LFTs, INR, aPTT , Mag, Phos, and Prealbumin normal. Scr 0.73. CT scan of the neck, abdomen, and pelvis was remarkable for a thickened wall of the cecum and proximal colon, however, the appearance of the pathology in the lung is not suggestive of metastatic colon cancer. Patient was seen by pulmonology (Dr. Vaughan Browner) and he suggested outpatient PET scan and bronchoscopy with biopsy. Patient remained clinically stable - vitals were stable and she was not hypoxic at rest. No evidence of superior vena cava syndrome. Pt is to follow up with Pulmonology as outpatient. Dr. Matilde Bash office will call her the week of 12/30/14 to schedule these procedures. Patient was  advised to call pulmonology office if she did not hear back from them by Tuesday 12/31/14. In addition, she was provided with our attending Dr. Azucena Freed cell phone number in case she had any questions or had trouble making an appointment with Pulmonology.   GERD -continued home med  Famotidine 20 mg qd   Hypothyroidism TSH normal.  -continued home med Synthroid 125 mcg qd         01/01/2015 1st [facility] office visit/ Mellody Masri   Chief Complaint  Patient presents with  . HFU    Pt states that she has had a HA every day since her hospital d/c. Her breathing has improved over the past 2 days. She states having pain in between her shoulder blades and chest tightness on and off.   sob is 24 /7 and just as bad walking as sitting assoc with epigastric / midline cp that rad to back assoc with dysphagia and can't take ppi (at least not omeprazole) s immediate swelling   No obvious day to day or daytime variability or assoc chronic cough or chest tightness, subjective wheeze or overt sinus or hb symptoms. No unusual exp hx or h/o childhood pna/ asthma or knowledge of premature birth.  Sleeping ok without nocturnal  or early am exacerbation  of respiratory  c/o's or need for noct saba. Also denies any obvious fluctuation of symptoms with weather or environmental changes or other aggravating or alleviating factors except as outlined above   Current Medications, Allergies, Complete Past Medical History, Past Surgical History, Family History, and Social History were reviewed in [employer] record.  ROS  The following are not active complaints unless bolded sore throat, dysphagia, dental problems, itching, sneezing,  nasal congestion or excess/ purulent secretions, ear ache,   fever, chills, sweats, unintended wt loss, classically pleuritic or exertional cp, hemoptysis,  orthopnea pnd or leg swelling, presyncope, palpitations, abdominal pain, anorexia, nausea, vomiting, diarrhea  or change in bowel or bladder habits, change in stools or urine, dysuria,hematuria,  rash, arthralgias, visual complaints, headache, numbness, weakness or ataxia or problems with walking or coordination,  change in mood/affect or memory.           Review of Systems      Objective:   Physical Exam    anxious amb wf nad   Wt Readings from Last 3 Encounters:  01/01/15 83.462 kg (184 lb)  12/26/14 84.2 kg (185 lb 10 oz)  12/02/14 84.511 kg (186 lb 5 oz)    Vital signs reviewed   HEENT: nl dentition, turbinates, and orophanx. Nl external ear canals without cough reflex   NECK :  without JVD/Nodes/TM/ nl carotid upstrokes bilaterally   LUNGS: no acc muscle use, clear to A and P bilaterally without cough on insp or exp maneuvers   CV:  RRR  no s3 or murmur or increase in P2, no edema   ABD:  soft and nontender with nl excursion in the supine position. No bruits or organomegaly, bowel sounds nl  MS:  warm without deformities, calf tenderness, cyanosis or clubbing  SKIN: warm and dry without lesions    NEURO:  alert, approp, no deficits     I personally reviewed images and agree with radiology impression as follows:  CT 12/26/14 Mediastinal and bilateral hilar lymphadenopathy.  1.2 cm soft tissue mass within superior segment of left lower lobe. Numerous other bilateral mostly subpleural soft tissue and ground-glass few mm pulmonary nodules.   01/01/2015 labs ordered ESR =  22 and CBC= nl    Assessment:

## 2015-01-15 NOTE — Transfer of Care (Signed)
Immediate Anesthesia Transfer of Care Note  Patient: Katrina [name]  Procedure(s) Performed: Procedure(s): VIDEO BRONCHOSCOPY WITH ENDOBRONCHIAL ULTRASOUND of  LEFT LUNG LOWER LOBE and right lung (Bilateral)  Patient Location: PACU  Anesthesia Type:General  Level of Consciousness: awake, alert , oriented and patient cooperative  Airway & Oxygen Therapy: Patient Spontanous Breathing and Patient connected to nasal cannula oxygen  Post-op Assessment: Report given to RN and Post -op Vital signs reviewed and stable  Post vital signs: Reviewed and stable  Last Vitals:  Filed Vitals:   01/15/15 0719  BP: 144/82  Pulse: 72  Temp: 36.5 C  Resp: 18    Complications: No apparent anesthesia complications

## 2015-01-15 NOTE — Anesthesia Postprocedure Evaluation (Signed)
  Anesthesia Post-op Note  Patient: Katrina [name]  Procedure(s) Performed: Procedure(s): VIDEO BRONCHOSCOPY WITH ENDOBRONCHIAL ULTRASOUND of  LEFT LUNG LOWER LOBE and right lung (Bilateral)  Patient Location: PACU  Anesthesia Type:General  Level of Consciousness: awake  Airway and Oxygen Therapy: Patient Spontanous Breathing  Post-op Pain: none  Post-op Assessment: Post-op Vital signs reviewed, Patient's Cardiovascular Status Stable, Respiratory Function Stable, Patent Airway, No signs of Nausea or vomiting and Pain level controlled              Post-op Vital Signs: Reviewed and stable  Last Vitals:  Filed Vitals:   01/15/15 1140  BP: 127/65  Pulse: 76  Temp:   Resp: 20    Complications: No apparent anesthesia complications

## 2015-01-15 NOTE — Anesthesia Preprocedure Evaluation (Signed)
Anesthesia Evaluation  Patient identified by MRN, date of birth, ID band Patient awake    Reviewed: Allergy & Precautions, NPO status , Patient's Chart, lab work & pertinent test results  History of Anesthesia Complications Negative for: history of anesthetic complications  Airway Mallampati: III  TM Distance: >3 FB Neck ROM: Full    Dental  (+) Teeth Intact   Pulmonary shortness of breath, neg sleep apnea, neg recent URI, neg PE   breath sounds clear to auscultation       Cardiovascular negative cardio ROS   Rhythm:Regular     Neuro/Psych  Headaches, negative psych ROS   GI/Hepatic Neg liver ROS, PUD, GERD  Medicated and Controlled,  Endo/Other  Hypothyroidism Morbid obesity  Renal/GU negative Renal ROS     Musculoskeletal   Abdominal   Peds  Hematology negative hematology ROS (+)   Anesthesia Other Findings   Reproductive/Obstetrics                             Anesthesia Physical Anesthesia Plan  ASA: II  Anesthesia Plan: General   Post-op Pain Management:    Induction: Intravenous  Airway Management Planned: Oral ETT  Additional Equipment: None  Intra-op Plan:   Post-operative Plan: Extubation in OR  Informed Consent: I have reviewed the patients History and Physical, chart, labs and discussed the procedure including the risks, benefits and alternatives for the proposed anesthesia with the patient or authorized representative who has indicated his/her understanding and acceptance.   Dental advisory given  Plan Discussed with: Anesthesiologist, CRNA and Surgeon  Anesthesia Plan Comments:         Anesthesia Quick Evaluation

## 2015-01-16 ENCOUNTER — Encounter (HOSPITAL_COMMUNITY): Payer: Self-pay | Admitting: Emergency Medicine

## 2015-01-17 LAB — CULTURE, RESPIRATORY W GRAM STAIN: Culture: NO GROWTH

## 2015-01-17 LAB — CULTURE, RESPIRATORY

## 2015-01-18 LAB — TISSUE CULTURE: Culture: NO GROWTH

## 2015-01-20 ENCOUNTER — Ambulatory Visit (INDEPENDENT_AMBULATORY_CARE_PROVIDER_SITE_OTHER): Payer: Self-pay | Admitting: Internal Medicine

## 2015-01-20 ENCOUNTER — Encounter: Payer: Self-pay | Admitting: Internal Medicine

## 2015-01-20 VITALS — BP 130/90 | HR 70 | Temp 97.3°F | Ht 62.0 in | Wt 186.0 lb

## 2015-01-20 DIAGNOSIS — E669 Obesity, unspecified: Secondary | ICD-10-CM

## 2015-01-20 DIAGNOSIS — R05 Cough: Secondary | ICD-10-CM

## 2015-01-20 DIAGNOSIS — R918 Other nonspecific abnormal finding of lung field: Secondary | ICD-10-CM

## 2015-01-20 DIAGNOSIS — R058 Other specified cough: Secondary | ICD-10-CM

## 2015-01-20 MED ORDER — PREDNISONE 10 MG PO TABS: ORAL_TABLET | ORAL | Status: DC

## 2015-01-20 NOTE — Patient Instructions (Addendum)
Tagamet 200  X 2 after breakfast and x 2 after supper  Prednisone 10 mg take  4 each am x 2 days,   2 each am x 2 days,  1 each am x 2 days and stop   Please schedule a follow up office visit in 6 weeks, call sooner if needed with cxr on return

## 2015-01-20 NOTE — Progress Notes (Signed)
Subjective:     Patient ID: Katrina [name], female   DOB: September 23, 1955   MRN: [id number]    History of Present Illness  59 yowf never smoker with hx eos esophagitis and doe onset in June 2016 then midline cp while off gerd rx    Date of Admission: 12/26/2014 11:43 AM Date of Discharge: 12/27/2014 Attending Physician: Annia Belt, MD  Discharge Diagnosis:  Active Problems:  Mediastinal lymphadenopathy  Lung mass  Discharge Medications:   Medication List    STOP taking these medications       lansoprazole 15 MG capsule  Commonly known as: PREVACID     omeprazole-sodium bicarbonate 40-1100 MG per capsule  Commonly known as: ZEGERID     sucralfate 1 GM/10ML suspension  Commonly known as: CARAFATE      TAKE these medications       cimetidine 200 MG tablet  Commonly known as: TAGAMET  Take 200 mg by mouth daily as needed (acid reflux).     levothyroxine 125 MCG tablet  Commonly known as: SYNTHROID, LEVOTHROID  Take 125 mcg by mouth daily before breakfast.        Disposition and follow-up:  Katrina [name] was discharged from [facility] in Stable condition. At the hospital follow up visit please address:  1. Mediastinal lymphadenopathy Patient is to follow up with Pulmonology (Dr. Vaughan Browner) as outpatient. His office will be calling the patient with an appointment date for PET scan and Bronchoscopy with Biopsy this week.   2. Labs / imaging needed at time of follow-up: PET scan, Bronchoscopy with Biopsy  3. Pending labs/ test needing follow-up: HIV antibody, Hep C antibody   Follow-up Appointments:     Follow-up Information    Follow up with Marshell Garfinkel, MD.   Specialty: Pulmonary Disease   Why: Pulmonology office will call you with an appointment date next week.    Contact information:   [address] [place] [address] [phone]       Discharge  Instructions: Please follow up with Pulmonology as outpatient. Dr. Matilde Bash office will call you next with an appointment date for PET scan and Bronchoscopy with biopsy. If you do not hear back from them by Tuesday 12/31/14, PLEASE CALL THEIR OFFICE TO SCHEDULE AN APPOINTMENT.   [facility]mon[facility]  [address] [place] [address] [phone]  If you have any problems contacting Pulmonology or have any questions, please do not hesitate to call Dr. Beryle Beams at [phone].  Consultations:   Pulmonology (Dr. Vaughan Browner)  Procedures Performed:   Imaging Results    Dg Chest 2 View  12/26/2014 CLINICAL DATA: Mid chest pain and shortness of breath. EXAM: CHEST 2 VIEW COMPARISON: Chest x-rays dated 11/08/2012 and 02/22/2011 FINDINGS: There is new cardiomegaly since the prior exams. The patient also has right hilar adenopathy with a possible mass at the inferior aspect of the right hilum. There is fullness in the azygos region. Density overlying the lingula on the PA view is most likely due to a cardiac lead electrode. No effusions. No osseous abnormality. IMPRESSION: New right hilar and mediastinal adenopathy. Probable mass at the inferior aspect of the right hilum. Findings are worrisome for malignancy. Electronically Signed By: Lorriane Shire M.D. On: 12/26/2014 12:43   Ct Soft Tissue Neck W Contrast  12/26/2014 CLINICAL DATA: Mediastinal and hilar lymphadenopathy. EXAM: CT NECK WITH CONTRAST TECHNIQUE: Multidetector CT imaging of the neck was performed using the standard protocol following the  bolus administration of intravenous contrast. CONTRAST: 40mL OMNIPAQUE IOHEXOL 300 MG/ML SOLN COMPARISON: None. FINDINGS: Pharynx and larynx: Normal. Salivary glands: Normal. Thyroid: Atrophic. No nodules. Lymph nodes: Normal. No adenopathy in the neck. Vascular: Normal. Limited intracranial: Normal. Visualized orbits: Normal. Mastoids and visualized paranasal  sinuses: Normal. Skeleton: Degenerative disc disease at C5-6 and C6-7. Otherwise normal. Upper chest: 3.6 mm nodule in the posterior aspect of the right upper lobe on image 106 of series 201. This is visible on the prior chest CT. IMPRESSION: Negative CT scan of the neck. No pathologic adenopathy. Electronically Signed By: Lorriane Shire M.D. On: 12/26/2014 19:08   Ct Chest W Contrast  12/26/2014 CLINICAL DATA: New right hilar and mediastinal adenopathy seen on chest x-ray. Shortness of breath for 2 months. EXAM: CT CHEST WITH CONTRAST TECHNIQUE: Multidetector CT imaging of the chest was performed during intravenous contrast administration. CONTRAST: 43mL OMNIPAQUE IOHEXOL 300 MG/ML SOLN COMPARISON: Chest x-ray dated 12/26/2014 FINDINGS: There is no evidence of central pulmonary embolus. The heart and great vessels are normal in appearance. There is a mediastinal and bilateral hilar lymphadenopathy. Enlarged pretracheal an perivascular lymph nodes are seen within the mediastinum. The largest conglomerate of lymph nodes is seen in the right hilum and measures 3.3 x 2.2 cm. There is no evidence of significant focal lung parenchymal consolidation, pleural effusion or pneumothorax. There are however numerous bilateral subpleural and paraseptal pulmonary nodules. The index pulmonary nodule is located within the superior segment of left lower lobe and measures 1.2 x 0.7 by 0.7 cm. Numerous other subpleural soft tissue and ground-glass few mm nodules scattered throughout both lungs. There is a prominent lymph node within the anterior mediastinal pericardial fat. No axillary lymphadenopathy is seen. The visualized portions of the thyroid gland are unremarkable in appearance. The visualized portions of the liver and spleen are unremarkable. The visualized portions of the pancreas, gallbladder, stomach, adrenal glands and kidneys are within normal limits. There is a 3 mm sclerotic focus within the  posterior aspect of T4 vertebral body. IMPRESSION: Mediastinal and bilateral hilar lymphadenopathy. 1.2 cm soft tissue mass within superior segment of left lower lobe. Numerous other bilateral mostly subpleural soft tissue and ground-glass few mm pulmonary nodules. 3 mm sclerotic focus within T4 vertebral body. Overall appearance is suspicious for metastatic disease, or less likely primary lung malignancy. Alternatively, pulmonary sarcoidosis may have a similar appearance. Evaluation with PET-CT and/or tissue diagnosis may be considered. Electronically Signed By: Fidela Salisbury M.D. On: 12/26/2014 15:12   Ct Abdomen Pelvis W Contrast  12/26/2014 CLINICAL DATA: 59 year old female with mediastinal adenopathy seen on the chest CT performed earlier the liver, gallbladder, pancreas, spleen, adrenal glands, kidneys, visualized ureters, and urinary bladder appear unremarkable. Hysterectomy. Today. EXAM: CT ABDOMEN AND PELVIS WITH CONTRAST TECHNIQUE: Multidetector CT imaging of the abdomen and pelvis was performed using the standard protocol following bolus administration of intravenous contrast. CONTRAST: 39mL OMNIPAQUE IOHEXOL 300 MG/ML SOLN COMPARISON: CT dated 11/28/2013 FINDINGS: No intra-abdominal free air or free fluid. The liver, gallbladder, pancreas, spleen, adrenal glands, kidneys, visualized ureters, and urinary bladder appear unremarkable. Hysterectomy. There is irregular and thickened appearance of the wall of the cecum and proximal colon. Although this may be related to presence of residual fecal material the cecal mass is not excluded. Colonoscopy is recommended for better evaluation. There is no evidence of bowel obstruction. The appendix is unremarkable. The abdominal aorta and IVC are patent. A mildly enlarged lymph nodes noted in the region of porta hepatis. Top-normal peripancreatic and portacaval  lymph nodes noted, nonspecific. Midline vertical anterior pelvic wall  incisional scar. There is mild degenerative changes of the spine with no acute fracture. IMPRESSION: Apparent irregularity and thickening of the cecum and ascending colon possibly related to residual fecal content. A cecal mass is not excluded. Colonoscopy recommended for better evaluation. Mildly prominent lymph nodes in the porta hepaticus, nonspecific. No acute intra-abdominal or pelvic pathology identified. Electronically Signed By: Anner Crete M.D. On: 12/26/2014 19:12     Admission HPI: Patient is a 59 yo F with a PMHx of hypothyroidism, GERD, eosinophilic esophagitis presenting to [facility] with progressive SOB and chest pain for the past few weeks. States the pain in her chest is epigastric and substernal. Denies having any cough or sputum production. Patient has a history of acid reflux and currently taking Cimetidine. She was diagnosed with eosinophilic esophagitis in 9629 and biopsy at the time showed inflammation and presence of Schatzki ring. Patient is also complaining of dysphagia to both solids and liquids and hoarseness of voice. States she has noticed a mass on the right side of her neck for the past 1 year, which is painful sometimes. Also complaining of sore throat associated with right ear pain. States she went to see her dentist 3 weeks ago and they checked her for TMJ but she was not told anything about the neck mass. No history of smoking, alcohol, or drug use. Never used any chewing tobacco. States she has been feeling weak and tired for several months. Denies having any fevers or night sweats. Denies any loss of appetite or weight loss. States she noticed blood in her stool back in 06/2014 and was told by GI to get a colonoscopy done. However, due to lack of insurance she never got it done. Patient had a partial hysterectomy in 2002 due to history of fibroids. States her mother had blood cancer (myelodysplastic syndrome?) and died at the age of 33. States she was  last sexually active 1 year ago, female partner. As per patient, she was this partner for 8 years and he had sexual relations with other people. During physical exam, it was noted that the patient had a wart on the plantar surface of her foot.   Hospital Course by problem list: Active Problems:  Mediastinal lymphadenopathy  Lung mass   Mediastinal Lymphadenopathy Patient presented with progressive chest pain and SOB. CT showed Mediastinal and bilateral hilar lymphadenopathy.1.2 cm soft tissue mass within superior segment of left lower lobe. Numerous other bilateral mostly subpleural soft tissue and ground-glass few mm pulmonary nodules.3 mm sclerotic focus within T4 vertebral body. Overall appearance suspicious for metastatic disease, or less likely primary lung malignancy. CT of head and neck negative for any pathologic lymphadenopathy. Patient is a never smoker. She did not have external lymphadenopathhy on exam. ACE level high (99). Normal CBC and blood chemistry profile. Labs including LDH, Uric acid, LFTs, INR, aPTT , Mag, Phos, and Prealbumin normal. Scr 0.73. CT scan of the neck, abdomen, and pelvis was remarkable for a thickened wall of the cecum and proximal colon, however, the appearance of the pathology in the lung is not suggestive of metastatic colon cancer. Patient was seen by pulmonology (Dr. Vaughan Browner) and he suggested outpatient PET scan and bronchoscopy with biopsy. Patient remained clinically stable - vitals were stable and she was not hypoxic at rest. No evidence of superior vena cava syndrome. Pt is to follow up with Pulmonology as outpatient. Dr. Matilde Bash office will call her the week of 12/30/14  to schedule these procedures. Patient was advised to call pulmonology office if she did not hear back from them by Tuesday 12/31/14. In addition, she was provided with our attending Dr. Azucena Freed cell phone number in case she had any questions or had trouble making an appointment with  Pulmonology.   GERD -continued home med Famotidine 20 mg qd   Hypothyroidism TSH normal.  -continued home med Synthroid 125 mcg qd         01/01/2015 1st [facility] office visit/ Katrina [name]   Chief Complaint  Patient presents with  . HFU    Pt states that she has had a HA every day since her hospital d/c. Her breathing has improved over the past 2 days. She states having pain in between her shoulder blades and chest tightness on and off.   sob is 24 /7 and just as bad walking as sitting assoc with epigastric / midline cp that rad to back assoc with dysphagia and can't take ppi (at least not omeprazole) s immediate swelling  Imp: prob sarcoid / ? uacs  rec Stop all advil and aleve and take tylenol  Pepcid 20 mg after bfast and after supper  GERD   Need to keep appt to get PET scan and be sure they send me a copy    01/15/15 > nl airways/  EBUS bx's > granulomatous changes only   01/20/2015  f/u ov/Katrina [name] re: uacs ? Related to gerd/ prob sarcoid  Chief Complaint  Patient presents with  . HFU    Pt c/o increased cough, wheezing and congestion since hospital d/c. Cough is prod with large amounts of yellow sputum.      Using lots of cough drops / some proair since procedure   No obvious day to day or daytime variability or assoc chronic cough or chest tightness, subjective wheeze or overt sinus or hb symptoms. No unusual exp hx or h/o childhood pna/ asthma or knowledge of premature birth.  Sleeping ok without nocturnal  or early am exacerbation  of respiratory  c/o's or need for noct saba. Also denies any obvious fluctuation of symptoms with weather or environmental changes or other aggravating or alleviating factors except as outlined above   Current Medications, Allergies, Complete Past Medical History, Past Surgical History, Family History, and Social History were reviewed in [employer] record.  ROS  The following are not active complaints unless  bolded sore throat, dysphagia, dental problems, itching, sneezing,  nasal congestion or excess/ purulent secretions, ear ache,   fever, chills, sweats, unintended wt loss, classically pleuritic or exertional cp, hemoptysis,  orthopnea pnd or leg swelling, presyncope, palpitations, abdominal pain, anorexia, nausea, vomiting, diarrhea  or change in bowel or bladder habits, change in stools or urine, dysuria,hematuria,  rash, arthralgias, visual complaints, headache, numbness, weakness or ataxia or problems with walking or coordination,  change in mood/affect or memory.               Objective:   Physical Exam    anxious amb wf nad  01/20/2015       186 Wt Readings from Last 3 Encounters:  01/01/15 83.462 kg (184 lb)  12/26/14 84.2 kg (185 lb 10 oz)  12/02/14 84.511 kg (186 lb 5 oz)    Vital signs reviewed   HEENT: nl dentition, turbinates, and orophanx. Nl external ear canals without cough reflex   NECK :  without JVD/Nodes/TM/ nl carotid upstrokes bilaterally   LUNGS: no acc muscle use, clear to  A and P bilaterally without cough on insp or exp maneuvers   CV:  RRR  no s3 or murmur or increase in P2, no edema   ABD:  soft and nontender with nl excursion in the supine position. No bruits or organomegaly, bowel sounds nl  MS:  warm without deformities, calf tenderness, cyanosis or clubbing  SKIN: warm and dry without lesions    NEURO:  alert, approp, no deficits     I personally reviewed images and agree with radiology impression as follows:  CT 12/26/14 Mediastinal and bilateral hilar lymphadenopathy.  1.2 cm soft tissue mass within superior segment of left lower lobe. Numerous other bilateral mostly subpleural soft tissue and ground-glass few mm pulmonary nodules.   01/01/2015 labs ordered ESR = 22 and CBC= nl    Assessment:

## 2015-01-20 NOTE — Assessment & Plan Note (Addendum)
See CT chest 12/26/14 12/26/14 vs last pCxR 10/2012 and last PA and Lat view in 02/2011  - ACE level 99 on 12/27/14  - ESR 22  01/01/2015  - PET 5/48/83 > Hypermetabolic paratracheal, subcarinal, AP window, hilar, infrahilar, and porta hepatis adenopathy. There are multiple tiny pulmonary nodules and a larger nodule along the left major fissure ; superior segment left lower lobe nodule along the major fissure is mildly hypermetabolic. Differential diagnostic considerations include lymphoma, sarcoidosis, or an unusual pattern for metastatic lung cancer. Tissue diagnosis recommended. 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

## 2015-01-26 DIAGNOSIS — R05 Cough: Secondary | ICD-10-CM | POA: Insufficient documentation

## 2015-01-26 DIAGNOSIS — E669 Obesity, unspecified: Secondary | ICD-10-CM | POA: Insufficient documentation

## 2015-01-26 DIAGNOSIS — R058 Other specified cough: Secondary | ICD-10-CM | POA: Insufficient documentation

## 2015-01-26 NOTE — Assessment & Plan Note (Signed)
Body mass index is 34.01   Lab Results  Component Value Date   TSH 2.001 12/26/2014     Contributing to gerd tendency/ doe/reviewed the need and the process to achieve and maintain neg calorie balance > defer f/u primary care including intermittently monitoring thyroid status

## 2015-01-26 NOTE — Assessment & Plan Note (Signed)
Aggravated by et/ fob > rec max gerd rx/ diet and Prednisone 10 mg take  4 each am x 2 days,   2 each am x 2 days,  1 each am x 2 days and stop

## 2015-01-27 ENCOUNTER — Inpatient Hospital Stay: Payer: Self-pay | Admitting: Pulmonary Disease

## 2015-02-12 LAB — FUNGUS CULTURE W SMEAR
Fungal Smear: NONE SEEN
Fungal Smear: NONE SEEN

## 2015-02-27 LAB — AFB CULTURE WITH SMEAR (NOT AT ARMC)
ACID FAST SMEAR: NONE SEEN
ACID FAST SMEAR: NONE SEEN

## 2015-03-04 ENCOUNTER — Ambulatory Visit (INDEPENDENT_AMBULATORY_CARE_PROVIDER_SITE_OTHER)
Admission: RE | Admit: 2015-03-04 | Discharge: 2015-03-04 | Disposition: A | Discharge status: Home / Self Care | Payer: Self-pay | Source: Ambulatory Visit | Attending: Internal Medicine | Admitting: Internal Medicine

## 2015-03-04 ENCOUNTER — Encounter: Payer: Self-pay | Admitting: Internal Medicine

## 2015-03-04 ENCOUNTER — Ambulatory Visit (INDEPENDENT_AMBULATORY_CARE_PROVIDER_SITE_OTHER): Payer: Self-pay | Admitting: Internal Medicine

## 2015-03-04 VITALS — BP 136/80 | HR 73 | Ht 62.0 in | Wt 187.8 lb

## 2015-03-04 DIAGNOSIS — R918 Other nonspecific abnormal finding of lung field: Secondary | ICD-10-CM

## 2015-03-04 DIAGNOSIS — R05 Cough: Secondary | ICD-10-CM

## 2015-03-04 DIAGNOSIS — R058 Other specified cough: Secondary | ICD-10-CM

## 2015-03-04 DIAGNOSIS — D860 Sarcoidosis of lung: Secondary | ICD-10-CM

## 2015-03-04 NOTE — Patient Instructions (Signed)
Stay on tagamet and diet   Please schedule a follow up visit in 3 months but call sooner if needed with cxr on return

## 2015-03-04 NOTE — Progress Notes (Signed)
Subjective:     Patient ID: Katrina [name], female   DOB: 02/13/1956    MRN: [id number]    History of Present Illness  58 yowf never smoker with hx eos esophagitis and doe onset in June 2016 then midline cp while off gerd rx    Date of Admission: 12/26/2014 11:43 AM Date of Discharge: 12/27/2014 Attending Physician: James M Granfortuna, MD  Discharge Diagnosis:  Active Problems:  Mediastinal lymphadenopathy  Lung mass  Discharge Medications:   Medication List    STOP taking these medications       lansoprazole 15 MG capsule  Commonly known as: PREVACID     omeprazole-sodium bicarbonate 40-1100 MG per capsule  Commonly known as: ZEGERID     sucralfate 1 GM/10ML suspension  Commonly known as: CARAFATE      TAKE these medications       cimetidine 200 MG tablet  Commonly known as: TAGAMET  Take 200 mg by mouth daily as needed (acid reflux).     levothyroxine 125 MCG tablet  Commonly known as: SYNTHROID, LEVOTHROID  Take 125 mcg by mouth daily before breakfast.        Disposition and follow-up:  Katrina [name] was discharged from [facility] in Stable condition. At the hospital follow up visit please address:  1. Mediastinal lymphadenopathy Patient is to follow up with Pulmonology (Dr. Mannam) as outpatient. His office will be calling the patient with an appointment date for PET scan and Bronchoscopy with Biopsy this week.   2. Labs / imaging needed at time of follow-up: PET scan, Bronchoscopy with Biopsy  3. Pending labs/ test needing follow-up: HIV antibody, Hep C antibody   Follow-up Appointments:     Follow-up Information    Follow up with Praveen Mannam, MD.   Specialty: Pulmonary Disease   Why: Pulmonology office will call you with an appointment date next week.    Contact information:   [address] [place] [address] [phone]       Discharge  Instructions: Please follow up with Pulmonology as outpatient. Dr. Mannam's office will call you next with an appointment date for PET scan and Bronchoscopy with biopsy. If you do not hear back from them by Tuesday 12/31/14, PLEASE CALL THEIR OFFICE TO SCHEDULE AN APPOINTMENT.   [facility]  [address]  [place] [address] [phone]  If you have any problems contacting Pulmonology or have any questions, please do not hesitate to call Dr. Granfortuna at [phone].  Consultations:   Pulmonology (Dr. Mannam)  Procedures Performed:   Imaging Results    Dg Chest 2 View  12/26/2014 CLINICAL DATA: Mid chest pain and shortness of breath. EXAM: CHEST 2 VIEW COMPARISON: Chest x-rays dated 11/08/2012 and 02/22/2011 FINDINGS: There is new cardiomegaly since the prior exams. The patient also has right hilar adenopathy with a possible mass at the inferior aspect of the right hilum. There is fullness in the azygos region. Density overlying the lingula on the PA view is most likely due to a cardiac lead electrode. No effusions. No osseous abnormality. IMPRESSION: New right hilar and mediastinal adenopathy. Probable mass at the inferior aspect of the right hilum. Findings are worrisome for malignancy. Electronically Signed By: James Maxwell M.D. On: 12/26/2014 12:43   Ct Soft Tissue Neck W Contrast  12/26/2014 CLINICAL DATA: Mediastinal and hilar lymphadenopathy. EXAM: CT NECK WITH CONTRAST TECHNIQUE: Multidetector CT imaging of the neck was performed using the standard protocol following   the bolus administration of intravenous contrast. CONTRAST: 6m OMNIPAQUE IOHEXOL 300 MG/ML SOLN COMPARISON: None. FINDINGS: Pharynx and larynx: Normal. Salivary glands: Normal. Thyroid: Atrophic. No nodules. Lymph nodes: Normal. No adenopathy in the neck. Vascular: Normal. Limited intracranial: Normal. Visualized orbits: Normal. Mastoids and visualized paranasal  sinuses: Normal. Skeleton: Degenerative disc disease at C5-6 and C6-7. Otherwise normal. Upper chest: 3.6 mm nodule in the posterior aspect of the right upper lobe on image 106 of series 201. This is visible on the prior chest CT. IMPRESSION: Negative CT scan of the neck. No pathologic adenopathy. Electronically Signed By: JLorriane ShireM.D. On: 12/26/2014 19:08   Ct Chest W Contrast  12/26/2014 CLINICAL DATA: New right hilar and mediastinal adenopathy seen on chest x-ray. Shortness of breath for 2 months. EXAM: CT CHEST WITH CONTRAST TECHNIQUE: Multidetector CT imaging of the chest was performed during intravenous contrast administration. CONTRAST: 730mOMNIPAQUE IOHEXOL 300 MG/ML SOLN COMPARISON: Chest x-ray dated 12/26/2014 FINDINGS: There is no evidence of central pulmonary embolus. The heart and great vessels are normal in appearance. There is a mediastinal and bilateral hilar lymphadenopathy. Enlarged pretracheal an perivascular lymph nodes are seen within the mediastinum. The largest conglomerate of lymph nodes is seen in the right hilum and measures 3.3 x 2.2 cm. There is no evidence of significant focal lung parenchymal consolidation, pleural effusion or pneumothorax. There are however numerous bilateral subpleural and paraseptal pulmonary nodules. The index pulmonary nodule is located within the superior segment of left lower lobe and measures 1.2 x 0.7 by 0.7 cm. Numerous other subpleural soft tissue and ground-glass few mm nodules scattered throughout both lungs. There is a prominent lymph node within the anterior mediastinal pericardial fat. No axillary lymphadenopathy is seen. The visualized portions of the thyroid gland are unremarkable in appearance. The visualized portions of the liver and spleen are unremarkable. The visualized portions of the pancreas, gallbladder, stomach, adrenal glands and kidneys are within normal limits. There is a 3 mm sclerotic focus within the  posterior aspect of T4 vertebral body. IMPRESSION: Mediastinal and bilateral hilar lymphadenopathy. 1.2 cm soft tissue mass within superior segment of left lower lobe. Numerous other bilateral mostly subpleural soft tissue and ground-glass few mm pulmonary nodules. 3 mm sclerotic focus within T4 vertebral body. Overall appearance is suspicious for metastatic disease, or less likely primary lung malignancy. Alternatively, pulmonary sarcoidosis may have a similar appearance. Evaluation with PET-CT and/or tissue diagnosis may be considered. Electronically Signed By: DoFidela Salisbury.D. On: 12/26/2014 15:12   Ct Abdomen Pelvis W Contrast  12/26/2014 CLINICAL DATA: [age]ear old female with mediastinal adenopathy seen on the chest CT performed earlier the liver, gallbladder, pancreas, spleen, adrenal glands, kidneys, visualized ureters, and urinary bladder appear unremarkable. Hysterectomy. Today. EXAM: CT ABDOMEN AND PELVIS WITH CONTRAST TECHNIQUE: Multidetector CT imaging of the abdomen and pelvis was performed using the standard protocol following bolus administration of intravenous contrast. CONTRAST: 7535mMNIPAQUE IOHEXOL 300 MG/ML SOLN COMPARISON: CT dated 11/28/2013 FINDINGS: No intra-abdominal free air or free fluid. The liver, gallbladder, pancreas, spleen, adrenal glands, kidneys, visualized ureters, and urinary bladder appear unremarkable. Hysterectomy. There is irregular and thickened appearance of the wall of the cecum and proximal colon. Although this may be related to presence of residual fecal material the cecal mass is not excluded. Colonoscopy is recommended for better evaluation. There is no evidence of bowel obstruction. The appendix is unremarkable. The abdominal aorta and IVC are patent. A mildly enlarged lymph nodes noted in the region of porta hepatis. Top-normal peripancreatic and  portacaval lymph nodes noted, nonspecific. Midline vertical anterior pelvic wall  incisional scar. There is mild degenerative changes of the spine with no acute fracture. IMPRESSION: Apparent irregularity and thickening of the cecum and ascending colon possibly related to residual fecal content. A cecal mass is not excluded. Colonoscopy recommended for better evaluation. Mildly prominent lymph nodes in the porta hepaticus, nonspecific. No acute intra-abdominal or pelvic pathology identified. Electronically Signed By: Anner Crete M.D. On: 12/26/2014 19:12     Admission HPI: Patient is a 59 yo F with a PMHx of hypothyroidism, GERD, eosinophilic esophagitis presenting to [facility] with progressive SOB and chest pain for the past few weeks. States the pain in her chest is epigastric and substernal. Denies having any cough or sputum production. Patient has a history of acid reflux and currently taking Cimetidine. She was diagnosed with eosinophilic esophagitis in 5956 and biopsy at the time showed inflammation and presence of Schatzki ring. Patient is also complaining of dysphagia to both solids and liquids and hoarseness of voice. States she has noticed a mass on the right side of her neck for the past 1 year, which is painful sometimes. Also complaining of sore throat associated with right ear pain. States she went to see her dentist 3 weeks ago and they checked her for TMJ but she was not told anything about the neck mass. No history of smoking, alcohol, or drug use. Never used any chewing tobacco. States she has been feeling weak and tired for several months. Denies having any fevers or night sweats. Denies any loss of appetite or weight loss. States she noticed blood in her stool back in 06/2014 and was told by GI to get a colonoscopy done. However, due to lack of insurance she never got it done. Patient had a partial hysterectomy in 2002 due to history of fibroids. States her mother had blood cancer (myelodysplastic syndrome?) and died at the age of 11. States she was  last sexually active 1 year ago, female partner. As per patient, she was this partner for 8 years and he had sexual relations with other people. During physical exam, it was noted that the patient had a wart on the plantar surface of her foot.   Hospital Course by problem list: Active Problems:  Mediastinal lymphadenopathy  Lung mass   Mediastinal Lymphadenopathy Patient presented with progressive chest pain and SOB. CT showed Mediastinal and bilateral hilar lymphadenopathy.1.2 cm soft tissue mass within superior segment of left lower lobe. Numerous other bilateral mostly subpleural soft tissue and ground-glass few mm pulmonary nodules.3 mm sclerotic focus within T4 vertebral body. Overall appearance suspicious for metastatic disease, or less likely primary lung malignancy. CT of head and neck negative for any pathologic lymphadenopathy. Patient is a never smoker. She did not have external lymphadenopathhy on exam. ACE level high (99). Normal CBC and blood chemistry profile. Labs including LDH, Uric acid, LFTs, INR, aPTT , Mag, Phos, and Prealbumin normal. Scr 0.73. CT scan of the neck, abdomen, and pelvis was remarkable for a thickened wall of the cecum and proximal colon, however, the appearance of the pathology in the lung is not suggestive of metastatic colon cancer. Patient was seen by pulmonology (Dr. Vaughan Browner) and he suggested outpatient PET scan and bronchoscopy with biopsy. Patient remained clinically stable - vitals were stable and she was not hypoxic at rest. No evidence of superior vena cava syndrome. Pt is to follow up with Pulmonology as outpatient. Dr. Matilde Bash office will call her the week of  12/30/14 to schedule these procedures. Patient was advised to call pulmonology office if she did not hear back from them by Tuesday 12/31/14. In addition, she was provided with our attending Dr. Granfortuna's cell phone number in case she had any questions or had trouble making an appointment with  Pulmonology.   GERD -continued home med Famotidine 20 mg qd   Hypothyroidism TSH normal.  -continued home med Synthroid 125 mcg qd         01/01/2015 [facility]st [facility] office visit/ [facility]   Chief Complaint  Patient presents with  . HFU    Pt states that she has had a HA every day since her hospital d/c. Her breathing has improved over the past 2 days. She states having pain in between her shoulder blades and chest tightness on and off.   sob is 24 /7 and just as bad walking as sitting assoc with epigastric / midline cp that rad to back assoc with dysphagia and can't take ppi (at least not omeprazole) s immediate swelling  Imp: prob sarcoid / ? uacs  rec Stop all advil and aleve and take tylenol  Pepcid 20 mg after bfast and after supper  GERD   Need to keep appt to get PET scan and be sure they send me a copy   01/15/15 > nl airways/  EBUS bx's > granulomatous changes only      01/20/2015  f/u ov/[facility] re: uacs ? Related to gerd/ prob sarcoid  Chief Complaint  Patient presents with  . HFU    Pt c/o increased cough, wheezing and congestion since hospital d/c. Cough is prod with large amounts of yellow sputum.    Using lots of cough drops / some proair since procedure rec Tagamet 200  X 2 after breakfast and x 2 after supper Prednisone 10 mg take  4 each am x 2 days,   2 each am x 2 days,  1 each am x 2 days and stop  Please schedule a follow up office visit in 6 weeks, call sooner if needed with cxr on return     03/04/2015  f/u ov/[facility] re: sarcoid/ all smiles / no need for rescue  Chief Complaint  Patient presents with  . Follow-up    Cough has completely resolved. No new co's today.     Not limited by breathing from desired activities      No obvious day to day or daytime variability or assoc chronic cough or chest tightness, subjective wheeze or overt sinus or hb symptoms. No unusual exp hx or h/o childhood pna/ asthma or knowledge of premature birth.  Sleeping  ok without nocturnal  or early am exacerbation  of respiratory  c/o's or need for noct saba. Also denies any obvious fluctuation of symptoms with weather or environmental changes or other aggravating or alleviating factors except as outlined above   Current Medications, Allergies, Complete Past Medical History, Past Surgical History, Family History, and Social History were reviewed in [facility] electronic medical record.  ROS  The following are not active complaints unless bolded sore throat, dysphagia, dental problems, itching, sneezing,  nasal congestion or excess/ purulent secretions, ear ache,   fever, chills, sweats, unintended wt loss, classically pleuritic or exertional cp, hemoptysis,  orthopnea pnd or leg swelling, presyncope, palpitations, abdominal pain, anorexia, nausea, vomiting, diarrhea  or change in bowel or bladder habits, change in stools or urine, dysuria,hematuria,  rash, arthralgias, visual complaints, headache, numbness, weakness or ataxia or problems with   walking or coordination,  change in mood/affect or memory.               Objective:   Physical Exam    anxious amb wf nad  01/20/2015       186> 03/04/2015   187     01/01/15 83.462 kg (184 lb)  12/26/14 84.2 kg (185 lb 10 oz)  12/02/14 84.511 kg (186 lb 5 oz)    Vital signs reviewed   HEENT: nl dentition, turbinates, and orophanx. Nl external ear canals without cough reflex   NECK :  without JVD/Nodes/TM/ nl carotid upstrokes bilaterally   LUNGS: no acc muscle use, clear to A and P bilaterally without cough on insp or exp maneuvers   CV:  RRR  no s3 or murmur or increase in P2, no edema   ABD:  soft and nontender with nl excursion in the supine position. No bruits or organomegaly, bowel sounds nl  MS:  warm without deformities, calf tenderness, cyanosis or clubbing  SKIN: warm and dry without lesions    NEURO:  alert, approp, no deficits        01/01/2015 labs ordered ESR = 22 and CBC= nl     I personally reviewed images and agree with radiology impression as follows:  CXR:  03/04/2015 Stable hilar lymphadenopathy. No discrete pulmonary parenchymal mass is observed. There is no evidence of CHF.    Assessment:

## 2015-03-09 ENCOUNTER — Encounter: Payer: Self-pay | Admitting: Internal Medicine

## 2015-03-09 NOTE — Assessment & Plan Note (Signed)
Resolved p short course of pred and gerd rx > should def continue the latter for now

## 2015-03-09 NOTE — Assessment & Plan Note (Signed)
See CT chest 12/26/14 12/26/14 vs last pCxR 10/2012 and last PA and Lat view in 02/2011  - ACE level 99 on 12/27/14  - ESR 22  01/01/2015  - PET 12/16/91 > Hypermetabolic paratracheal, subcarinal, AP window, hilar, infrahilar, and porta hepatis adenopathy. There are multiple tiny pulmonary nodules and a larger nodule along the left major fissure ; superior segment left lower lobe nodule along the major fissure is mildly hypermetabolic. Differential diagnostic considerations include lymphoma, sarcoidosis, or an unusual pattern for metastatic lung cancer. Tissue diagnosis recommended. - EBUS   9/28 /16 > granulomatous inflammation >>>  She is doing great with no signs or symptoms of progressive sarcoidosis. I reviewed the natural history of sarcoidosis and indications for prednisone which are not present and will continue to follow her every 3 months to make sure they stay that way.  Each maintenance medication was reviewed in detail including most importantly the difference between maintenance and as needed and under what circumstances the prns are to be used.  Please see instructions for details which were reviewed in writing and the patient given a copy.

## 2015-06-04 ENCOUNTER — Ambulatory Visit: Payer: Self-pay | Admitting: Internal Medicine

## 2015-06-09 ENCOUNTER — Ambulatory Visit (INDEPENDENT_AMBULATORY_CARE_PROVIDER_SITE_OTHER)
Admission: RE | Admit: 2015-06-09 | Discharge: 2015-06-09 | Disposition: A | Discharge status: Home / Self Care | Payer: Self-pay | Source: Ambulatory Visit | Attending: Internal Medicine | Admitting: Internal Medicine

## 2015-06-09 ENCOUNTER — Ambulatory Visit (INDEPENDENT_AMBULATORY_CARE_PROVIDER_SITE_OTHER): Payer: Self-pay | Admitting: Internal Medicine

## 2015-06-09 ENCOUNTER — Encounter: Payer: Self-pay | Admitting: Internal Medicine

## 2015-06-09 ENCOUNTER — Telehealth: Payer: Self-pay | Admitting: Internal Medicine

## 2015-06-09 VITALS — BP 126/82 | HR 70 | Temp 97.0°F | Ht 62.5 in | Wt 187.0 lb

## 2015-06-09 DIAGNOSIS — D860 Sarcoidosis of lung: Secondary | ICD-10-CM

## 2015-06-09 DIAGNOSIS — R058 Other specified cough: Secondary | ICD-10-CM

## 2015-06-09 DIAGNOSIS — R05 Cough: Secondary | ICD-10-CM

## 2015-06-09 LAB — NITRIC OXIDE: Nitric Oxide: 14

## 2015-06-09 MED ORDER — RANITIDINE HCL 300 MG PO TABS: 300.0000 mg | ORAL_TABLET | Freq: Two times a day (BID) | ORAL | Status: DC

## 2015-06-09 MED ORDER — RANITIDINE HCL 300 MG PO TABS: 300.0000 mg | ORAL_TABLET | Freq: Every day | ORAL | Status: DC

## 2015-06-09 MED ORDER — METHYLPREDNISOLONE ACETATE 80 MG/ML IJ SUSP
120.0000 mg | Freq: Once | INTRAMUSCULAR | Status: AC
Start: 2015-06-09 — End: 2015-06-09
  Administered 2015-06-09: 120 mg via INTRAMUSCULAR

## 2015-06-09 MED ORDER — TRAMADOL HCL 50 MG PO TABS: ORAL_TABLET | ORAL | Status: DC

## 2015-06-09 NOTE — Assessment & Plan Note (Addendum)
Spirometry 06/09/2015  wnl including [id number] while "wheezing" - NO 06/09/2015 = 14    Acute flare cough  classic for    Upper airway cough syndrome, so named because it's frequently impossible to sort out how much is  CR/sinusitis with freq throat clearing (which can be related to primary GERD)   vs  causing  secondary (" extra esophageal")  GERD from wide swings in gastric pressure that occur with throat clearing, often  promoting self use of mint and menthol lozenges that reduce the lower esophageal sphincter tone and exacerbate the problem further in a cyclical fashion.   These are the same pts (now being labeled as having "irritable larynx syndrome" by some cough centers) who not infrequently have a history of having failed to tolerate ace inhibitors,  dry powder inhalers or biphosphonates or report having atypical reflux symptoms that don't respond to standard doses of PPI and certainly not H2 otc , and are easily confused as having aecopd or asthma flares by even experienced allergists/ pulmonologists.  symptoms that don't respond to standard doses of PPI  Explained the natural history of uacs/cyclical cough  and why it's necessary in patients at risk to treat GERD aggressively - at least  short term -   to reduce risk of evolving cyclical cough initially  triggered by epithelial injury and a heightened sensitivty to the effects of any upper airway irritants,  most importantly acid - related - then perpetuated by epithelial injury related to the cough itself as the upper airway collapses on itself.  That is, the more sensitive the epithelium becomes once it is damaged by the virus, the more the ensuing irritability> the more the cough, the more the secondary reflux (especially in those prone to reflux) the more the irritation of the sensitive mucosa and so on in a  Classic cyclical pattern.    I had an extended discussion with the patient reviewing all relevant studies completed to date and  lasting  15 to 20 minutes of a 25 minute visit    Each maintenance medication was reviewed in detail including most importantly the difference between maintenance and prns and under what circumstances the prns are to be triggered using an action plan format that is not reflected in the computer generated alphabetically organized AVS.    Please see instructions for details which were reviewed in writing and the patient given a copy highlighting the part that I personally wrote and discussed at today's ov.

## 2015-06-09 NOTE — Assessment & Plan Note (Signed)
See CT chest 12/26/14 12/26/14 vs last pCxR 10/2012 and last PA and Lat view in 02/2011  - ACE level 99 on 12/27/14  - ESR 22  01/01/2015  - PET 08/24/69 > Hypermetabolic paratracheal, subcarinal, AP window, hilar, infrahilar, and porta hepatis adenopathy. There are multiple tiny pulmonary nodules and a larger nodule along the left major fissure ; superior segment left lower lobe nodule along the major fissure is mildly hypermetabolic. Differential diagnostic considerations include lymphoma, sarcoidosis, or an unusual pattern for metastatic lung cancer. Tissue diagnosis recommended. - EBUS   9/28 /16 > granulomatous inflammation >>>   No evidence of sarcoid flare

## 2015-06-09 NOTE — Progress Notes (Signed)
Quick Note:  ATC, NA and VM not set up ______

## 2015-06-09 NOTE — Telephone Encounter (Signed)
Pt added to schedule

## 2015-06-09 NOTE — Progress Notes (Signed)
Subjective:     Patient ID: Katrina [name], female   DOB: 02/13/1956    MRN: [id number]    History of Present Illness  58 yowf never smoker with hx eos esophagitis and doe onset in June 2016 then midline cp while off gerd rx    Date of Admission: 12/26/2014 11:43 AM Date of Discharge: 12/27/2014 Attending Physician: James M Granfortuna, MD  Discharge Diagnosis:  Active Problems:  Mediastinal lymphadenopathy  Lung mass  Discharge Medications:   Medication List    STOP taking these medications       lansoprazole 15 MG capsule  Commonly known as: PREVACID     omeprazole-sodium bicarbonate 40-1100 MG per capsule  Commonly known as: ZEGERID     sucralfate 1 GM/10ML suspension  Commonly known as: CARAFATE      TAKE these medications       cimetidine 200 MG tablet  Commonly known as: TAGAMET  Take 200 mg by mouth daily as needed (acid reflux).     levothyroxine 125 MCG tablet  Commonly known as: SYNTHROID, LEVOTHROID  Take 125 mcg by mouth daily before breakfast.        Disposition and follow-up:  Katrina [name] was discharged from [facility] in Stable condition. At the hospital follow up visit please address:  1. Mediastinal lymphadenopathy Patient is to follow up with Pulmonology (Dr. Mannam) as outpatient. His office will be calling the patient with an appointment date for PET scan and Bronchoscopy with Biopsy this week.   2. Labs / imaging needed at time of follow-up: PET scan, Bronchoscopy with Biopsy  3. Pending labs/ test needing follow-up: HIV antibody, Hep C antibody   Follow-up Appointments:     Follow-up Information    Follow up with Praveen Mannam, MD.   Specialty: Pulmonary Disease   Why: Pulmonology office will call you with an appointment date next week.    Contact information:   [address] [place] [address] [phone]       Discharge  Instructions: Please follow up with Pulmonology as outpatient. Dr. Mannam's office will call you next with an appointment date for PET scan and Bronchoscopy with biopsy. If you do not hear back from them by Tuesday 12/31/14, PLEASE CALL THEIR OFFICE TO SCHEDULE AN APPOINTMENT.   [facility]  [address]  [place] [address] [phone]  If you have any problems contacting Pulmonology or have any questions, please do not hesitate to call Dr. Granfortuna at [phone].  Consultations:   Pulmonology (Dr. Mannam)  Procedures Performed:   Imaging Results    Dg Chest 2 View  12/26/2014 CLINICAL DATA: Mid chest pain and shortness of breath. EXAM: CHEST 2 VIEW COMPARISON: Chest x-rays dated 11/08/2012 and 02/22/2011 FINDINGS: There is new cardiomegaly since the prior exams. The patient also has right hilar adenopathy with a possible mass at the inferior aspect of the right hilum. There is fullness in the azygos region. Density overlying the lingula on the PA view is most likely due to a cardiac lead electrode. No effusions. No osseous abnormality. IMPRESSION: New right hilar and mediastinal adenopathy. Probable mass at the inferior aspect of the right hilum. Findings are worrisome for malignancy. Electronically Signed By: James Maxwell M.D. On: 12/26/2014 12:43   Ct Soft Tissue Neck W Contrast  12/26/2014 CLINICAL DATA: Mediastinal and hilar lymphadenopathy. EXAM: CT NECK WITH CONTRAST TECHNIQUE: Multidetector CT imaging of the neck was performed using the standard protocol following   the bolus administration of intravenous contrast. CONTRAST: 6m OMNIPAQUE IOHEXOL 300 MG/ML SOLN COMPARISON: None. FINDINGS: Pharynx and larynx: Normal. Salivary glands: Normal. Thyroid: Atrophic. No nodules. Lymph nodes: Normal. No adenopathy in the neck. Vascular: Normal. Limited intracranial: Normal. Visualized orbits: Normal. Mastoids and visualized paranasal  sinuses: Normal. Skeleton: Degenerative disc disease at C5-6 and C6-7. Otherwise normal. Upper chest: 3.6 mm nodule in the posterior aspect of the right upper lobe on image 106 of series 201. This is visible on the prior chest CT. IMPRESSION: Negative CT scan of the neck. No pathologic adenopathy. Electronically Signed By: JLorriane ShireM.D. On: 12/26/2014 19:08   Ct Chest W Contrast  12/26/2014 CLINICAL DATA: New right hilar and mediastinal adenopathy seen on chest x-ray. Shortness of breath for 2 months. EXAM: CT CHEST WITH CONTRAST TECHNIQUE: Multidetector CT imaging of the chest was performed during intravenous contrast administration. CONTRAST: 730mOMNIPAQUE IOHEXOL 300 MG/ML SOLN COMPARISON: Chest x-ray dated 12/26/2014 FINDINGS: There is no evidence of central pulmonary embolus. The heart and great vessels are normal in appearance. There is a mediastinal and bilateral hilar lymphadenopathy. Enlarged pretracheal an perivascular lymph nodes are seen within the mediastinum. The largest conglomerate of lymph nodes is seen in the right hilum and measures 3.3 x 2.2 cm. There is no evidence of significant focal lung parenchymal consolidation, pleural effusion or pneumothorax. There are however numerous bilateral subpleural and paraseptal pulmonary nodules. The index pulmonary nodule is located within the superior segment of left lower lobe and measures 1.2 x 0.7 by 0.7 cm. Numerous other subpleural soft tissue and ground-glass few mm nodules scattered throughout both lungs. There is a prominent lymph node within the anterior mediastinal pericardial fat. No axillary lymphadenopathy is seen. The visualized portions of the thyroid gland are unremarkable in appearance. The visualized portions of the liver and spleen are unremarkable. The visualized portions of the pancreas, gallbladder, stomach, adrenal glands and kidneys are within normal limits. There is a 3 mm sclerotic focus within the  posterior aspect of T4 vertebral body. IMPRESSION: Mediastinal and bilateral hilar lymphadenopathy. 1.2 cm soft tissue mass within superior segment of left lower lobe. Numerous other bilateral mostly subpleural soft tissue and ground-glass few mm pulmonary nodules. 3 mm sclerotic focus within T4 vertebral body. Overall appearance is suspicious for metastatic disease, or less likely primary lung malignancy. Alternatively, pulmonary sarcoidosis may have a similar appearance. Evaluation with PET-CT and/or tissue diagnosis may be considered. Electronically Signed By: DoFidela Salisbury.D. On: 12/26/2014 15:12   Ct Abdomen Pelvis W Contrast  12/26/2014 CLINICAL DATA: [age]ear old female with mediastinal adenopathy seen on the chest CT performed earlier the liver, gallbladder, pancreas, spleen, adrenal glands, kidneys, visualized ureters, and urinary bladder appear unremarkable. Hysterectomy. Today. EXAM: CT ABDOMEN AND PELVIS WITH CONTRAST TECHNIQUE: Multidetector CT imaging of the abdomen and pelvis was performed using the standard protocol following bolus administration of intravenous contrast. CONTRAST: 7535mMNIPAQUE IOHEXOL 300 MG/ML SOLN COMPARISON: CT dated 11/28/2013 FINDINGS: No intra-abdominal free air or free fluid. The liver, gallbladder, pancreas, spleen, adrenal glands, kidneys, visualized ureters, and urinary bladder appear unremarkable. Hysterectomy. There is irregular and thickened appearance of the wall of the cecum and proximal colon. Although this may be related to presence of residual fecal material the cecal mass is not excluded. Colonoscopy is recommended for better evaluation. There is no evidence of bowel obstruction. The appendix is unremarkable. The abdominal aorta and IVC are patent. A mildly enlarged lymph nodes noted in the region of porta hepatis. Top-normal peripancreatic and  portacaval lymph nodes noted, nonspecific. Midline vertical anterior pelvic wall  incisional scar. There is mild degenerative changes of the spine with no acute fracture. IMPRESSION: Apparent irregularity and thickening of the cecum and ascending colon possibly related to residual fecal content. A cecal mass is not excluded. Colonoscopy recommended for better evaluation. Mildly prominent lymph nodes in the porta hepaticus, nonspecific. No acute intra-abdominal or pelvic pathology identified. Electronically Signed By: Anner Crete M.D. On: 12/26/2014 19:12     Admission HPI: Patient is a 60 yo F with a PMHx of hypothyroidism, GERD, eosinophilic esophagitis presenting to [facility] with progressive SOB and chest pain for the past few weeks. States the pain in her chest is epigastric and substernal. Denies having any cough or sputum production. Patient has a history of acid reflux and currently taking Cimetidine. She was diagnosed with eosinophilic esophagitis in 5956 and biopsy at the time showed inflammation and presence of Schatzki ring. Patient is also complaining of dysphagia to both solids and liquids and hoarseness of voice. States she has noticed a mass on the right side of her neck for the past 1 year, which is painful sometimes. Also complaining of sore throat associated with right ear pain. States she went to see her dentist 3 weeks ago and they checked her for TMJ but she was not told anything about the neck mass. No history of smoking, alcohol, or drug use. Never used any chewing tobacco. States she has been feeling weak and tired for several months. Denies having any fevers or night sweats. Denies any loss of appetite or weight loss. States she noticed blood in her stool back in 06/2014 and was told by GI to get a colonoscopy done. However, due to lack of insurance she never got it done. Patient had a partial hysterectomy in 2002 due to history of fibroids. States her mother had blood cancer (myelodysplastic syndrome?) and died at the age of 11. States she was  last sexually active 1 year ago, female partner. As per patient, she was this partner for 8 years and he had sexual relations with other people. During physical exam, it was noted that the patient had a wart on the plantar surface of her foot.   Hospital Course by problem list: Active Problems:  Mediastinal lymphadenopathy  Lung mass   Mediastinal Lymphadenopathy Patient presented with progressive chest pain and SOB. CT showed Mediastinal and bilateral hilar lymphadenopathy.1.2 cm soft tissue mass within superior segment of left lower lobe. Numerous other bilateral mostly subpleural soft tissue and ground-glass few mm pulmonary nodules.3 mm sclerotic focus within T4 vertebral body. Overall appearance suspicious for metastatic disease, or less likely primary lung malignancy. CT of head and neck negative for any pathologic lymphadenopathy. Patient is a never smoker. She did not have external lymphadenopathhy on exam. ACE level high (99). Normal CBC and blood chemistry profile. Labs including LDH, Uric acid, LFTs, INR, aPTT , Mag, Phos, and Prealbumin normal. Scr 0.73. CT scan of the neck, abdomen, and pelvis was remarkable for a thickened wall of the cecum and proximal colon, however, the appearance of the pathology in the lung is not suggestive of metastatic colon cancer. Patient was seen by pulmonology (Dr. Vaughan Browner) and he suggested outpatient PET scan and bronchoscopy with biopsy. Patient remained clinically stable - vitals were stable and she was not hypoxic at rest. No evidence of superior vena cava syndrome. Pt is to follow up with Pulmonology as outpatient. Dr. Matilde Bash office will call her the week of  12/30/14 to schedule these procedures. Patient was advised to call pulmonology office if she did not hear back from them by Tuesday 12/31/14. In addition, she was provided with our attending Dr. Azucena Freed cell phone number in case she had any questions or had trouble making an appointment with  Pulmonology.   GERD -continued home med Famotidine 20 mg qd   Hypothyroidism TSH normal.  -continued home med Synthroid 125 mcg qd         01/01/2015 1st  Pulmonary office visit/ Katrina [name]   Chief Complaint  Patient presents with  . HFU    Pt states that she has had a HA every day since her hospital d/c. Her breathing has improved over the past 2 days. She states having pain in between her shoulder blades and chest tightness on and off.   sob is 24 /7 and just as bad walking as sitting assoc with epigastric / midline cp that rad to back assoc with dysphagia and can't take ppi (at least not omeprazole) s immediate swelling  Imp: prob sarcoid / ? uacs  rec Stop all advil and aleve and take tylenol  Pepcid 20 mg after bfast and after supper  GERD   Need to keep appt to get PET scan and be sure they send me a copy   01/15/15 > nl airways/  EBUS bx's > granulomatous changes only      01/20/2015  f/u ov/Katrina [name] re: uacs ? Related to gerd/ prob sarcoid  Chief Complaint  Patient presents with  . HFU    Pt c/o increased cough, wheezing and congestion since hospital d/c. Cough is prod with large amounts of yellow sputum.    Using lots of cough drops / some proair since procedure rec Tagamet 200  X 2 after breakfast and x 2 after supper Prednisone 10 mg take  4 each am x 2 days,   2 each am x 2 days,  1 each am x 2 days and stop  Please schedule a follow up office visit in 6 weeks, call sooner if needed with cxr on return     03/04/2015  f/u ov/Katrina [name] re: sarcoid/ all smiles / no need for rescue  Chief Complaint  Patient presents with  . Follow-up    Cough has completely resolved. No new co's today.   rec  Stay on tagamet and diet  Please schedule a follow up visit in 3 months but call sooner if needed with cxr on return    06/09/2015 acute extended ov/Katrina [name] re:  Tagamet 200 mg one twice daily until got sick then 400 bid Chief Complaint  Patient presents with  . Acute Visit     Pt states she has been sick since 06/04/15- ear ache and congestion- was txed with doxy and pred taper. She states that since then symptoms have settled in her chest- she wakes up SOB, has chest tightness and has prod cough with clear sputum.    similar illness in January 2017 got shot of pred/ po course resolved completely  06/03/15 exp to dust> intching/ sneezing /coughing > all better with otc's except for the cough  06/04/15 rx doxy/pred  - worse p albuterol  Worse breathing/coughing  is 3 am       No obvious day to day or daytime variability or assoc excess/ purulent sputum or mucus plugs   chest tightness, subjective wheeze or overt sinus or hb symptoms. No unusual exp hx or h/o childhood pna/ asthma or knowledge of premature  birth.  Sleeping ok without nocturnal  or early am exacerbation  of respiratory  c/o's or need for noct saba. Also denies any obvious fluctuation of symptoms with weather or environmental changes or other aggravating or alleviating factors except as outlined above   Current Medications, Allergies, Complete Past Medical History, Past Surgical History, Family History, and Social History were reviewed in [employer] record.   ROS  The following are not active complaints unless bolded sore throat, dysphagia, dental problems, itching, sneezing,  nasal congestion or excess/ purulent secretions, ear ache,   fever, chills, sweats, unintended wt loss, classically pleuritic or exertional cp, hemoptysis,  orthopnea pnd or leg swelling, presyncope, palpitations, abdominal pain, anorexia, nausea, vomiting, diarrhea  or change in bowel or bladder habits, change in stools or urine, dysuria,hematuria,  rash, arthralgias, visual complaints, headache, numbness, weakness or ataxia or problems with walking or coordination,  change in mood/affect or memory.               Objective:   Physical Exam    anxious amb wf nad/ severe pseudowheeze and cough with fvc  maneuver  01/20/2015       186> 03/04/2015   187 > 06/09/2015  187     01/01/15 83.462 kg (184 lb)  12/26/14 84.2 kg (185 lb 10 oz)  12/02/14 84.511 kg (186 lb 5 oz)    Vital signs reviewed   HEENT: nl dentition, turbinates, and orophanx. Nl external ear canals without cough reflex   NECK :  without JVD/Nodes/TM/ nl carotid upstrokes bilaterally   LUNGS: no acc muscle use, clear to A and P bilaterally without cough on insp or exp maneuvers with purse lip    CV:  RRR  no s3 or murmur or increase in P2, no edema   ABD:  soft and nontender with nl excursion in the supine position. 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  Stable slight interstitial prominence.        Assessment:

## 2015-06-09 NOTE — Progress Notes (Signed)
Quick Note:  Spoke with pt and notified of results per Dr. Wert. Pt verbalized understanding and denied any questions.  ______ 

## 2015-06-09 NOTE — Patient Instructions (Addendum)
Please remember to go to the  x-ray department downstairs for your tests - we will call you with the results when they are available.  Depomedrol 120 mg IM  Stop the tagamet and take zantac 300 mg after bfast and at bedtime    Take delsym two tsp every 12 hours and supplement if needed with  tramadol 50 mg up to 2 every 4 hours to suppress the urge to cough. Swallowing water or using ice chips/non mint and menthol containing candies (such as lifesavers or sugarless jolly ranchers) are also effective.  You should rest your voice and avoid activities that you know make you cough.  Once you have eliminated the cough for 3 straight days try reducing the tramadol first,  then the delsym as tolerated.    Please schedule a follow up office visit in 2 weeks, sooner if needed - we will do additional allergy tests then

## 2015-06-17 ENCOUNTER — Ambulatory Visit: Payer: Self-pay | Admitting: Internal Medicine

## 2015-06-27 ENCOUNTER — Ambulatory Visit (INDEPENDENT_AMBULATORY_CARE_PROVIDER_SITE_OTHER): Payer: Self-pay | Admitting: Internal Medicine

## 2015-06-27 ENCOUNTER — Encounter: Payer: Self-pay | Admitting: Internal Medicine

## 2015-06-27 VITALS — BP 144/90 | HR 75 | Ht 62.5 in | Wt 198.0 lb

## 2015-06-27 DIAGNOSIS — R05 Cough: Secondary | ICD-10-CM

## 2015-06-27 DIAGNOSIS — R058 Other specified cough: Secondary | ICD-10-CM

## 2015-06-27 DIAGNOSIS — D860 Sarcoidosis of lung: Secondary | ICD-10-CM

## 2015-06-27 MED ORDER — RANITIDINE HCL 300 MG PO TABS: 300.0000 mg | ORAL_TABLET | Freq: Two times a day (BID) | ORAL | Status: DC

## 2015-06-27 NOTE — Progress Notes (Signed)
Subjective:     Patient ID: Katrina [name], female   DOB: 1955/06/24    MRN: [id number]    History of Present Illness  60 yowf never smoker with hx eos esophagitis and doe onset in June 2016 then midline cp while off gerd rx    Date of Admission: 12/26/2014   Date of Discharge: 12/27/2014    Discharge Diagnosis:  Active Problems:  Mediastinal lymphadenopathy  Lung mass  Discharge Medications:   Medication List    STOP taking these medications       lansoprazole 15 MG capsule  Commonly known as: PREVACID     omeprazole-sodium bicarbonate 40-1100 MG per capsule  Commonly known as: ZEGERID     sucralfate 1 GM/10ML suspension  Commonly known as: CARAFATE      TAKE these medications       cimetidine 200 MG tablet  Commonly known as: TAGAMET  Take 200 mg by mouth daily as needed (acid reflux).     levothyroxine 125 MCG tablet  Commonly known as: SYNTHROID, LEVOTHROID  Take 125 mcg by mouth daily before breakfast.        Disposition and follow-up:  [name] was discharged from [facility] in Stable condition. At the hospital follow up visit please address:  1. Mediastinal lymphadenopathy Patient is to follow up with Pulmonology (Dr. Vaughan Browner) as outpatient. His office will be calling the patient with an appointment date for PET scan and Bronchoscopy with Biopsy this week.   2. Labs / imaging needed at time of follow-up: PET scan, Bronchoscopy with Biopsy  3. Pending labs/ test needing follow-up: HIV antibody, Hep C antibody   Follow-up Appointments:     Follow-up Information    Follow up with Marshell Garfinkel, MD.   Specialty: Pulmonary Disease   Why: Pulmonology office will call you with an appointment date next week.    Contact information:   [address] [place] [address] [phone]       Discharge Instructions: Please follow up with Pulmonology as  outpatient. Dr. Matilde Bash office will call you next with an appointment date for PET scan and Bronchoscopy with biopsy. If you do not hear back from them by Tuesday 12/31/14, PLEASE CALL THEIR OFFICE TO SCHEDULE AN APPOINTMENT.   [facility]mon[facility]  [address] [place] [address] [phone]  If you have any problems contacting Pulmonology or have any questions, please do not hesitate to call Dr. Beryle Beams at [phone].  Consultations:   Pulmonology (Dr. Vaughan Browner)  Procedures Performed:   Imaging Results    Dg Chest 2 View  12/26/2014 CLINICAL DATA: Mid chest pain and shortness of breath. EXAM: CHEST 2 VIEW COMPARISON: Chest x-rays dated 11/08/2012 and 02/22/2011 FINDINGS: There is new cardiomegaly since the prior exams. The patient also has right hilar adenopathy with a possible mass at the inferior aspect of the right hilum. There is fullness in the azygos region. Density overlying the lingula on the PA view is most likely due to a cardiac lead electrode. No effusions. No osseous abnormality. IMPRESSION: New right hilar and mediastinal adenopathy. Probable mass at the inferior aspect of the right hilum. Findings are worrisome for malignancy. Electronically Signed By: Lorriane Shire M.D. On: 12/26/2014 12:43   Ct Soft Tissue Neck W Contrast  12/26/2014 CLINICAL DATA: Mediastinal and hilar lymphadenopathy. EXAM: CT NECK WITH CONTRAST TECHNIQUE: Multidetector CT imaging of the neck was performed using the standard protocol following the bolus administration of  intravenous contrast. CONTRAST: 22mL OMNIPAQUE IOHEXOL 300 MG/ML SOLN COMPARISON: None. FINDINGS: Pharynx and larynx: Normal. Salivary glands: Normal. Thyroid: Atrophic. No nodules. Lymph nodes: Normal. No adenopathy in the neck. Vascular: Normal. Limited intracranial: Normal. Visualized orbits: Normal. Mastoids and visualized paranasal sinuses: Normal. Skeleton: Degenerative disc disease  at C5-6 and C6-7. Otherwise normal. Upper chest: 3.6 mm nodule in the posterior aspect of the right upper lobe on image 106 of series 201. This is visible on the prior chest CT. IMPRESSION: Negative CT scan of the neck. No pathologic adenopathy. Electronically Signed By: Lorriane Shire M.D. On: 12/26/2014 19:08   Ct Chest W Contrast  12/26/2014 CLINICAL DATA: New right hilar and mediastinal adenopathy seen on chest x-ray. Shortness of breath for 2 months. EXAM: CT CHEST WITH CONTRAST TECHNIQUE: Multidetector CT imaging of the chest was performed during intravenous contrast administration. CONTRAST: 13mL OMNIPAQUE IOHEXOL 300 MG/ML SOLN COMPARISON: Chest x-ray dated 12/26/2014 FINDINGS: There is no evidence of central pulmonary embolus. The heart and great vessels are normal in appearance. There is a mediastinal and bilateral hilar lymphadenopathy. Enlarged pretracheal an perivascular lymph nodes are seen within the mediastinum. The largest conglomerate of lymph nodes is seen in the right hilum and measures 3.3 x 2.2 cm. There is no evidence of significant focal lung parenchymal consolidation, pleural effusion or pneumothorax. There are however numerous bilateral subpleural and paraseptal pulmonary nodules. The index pulmonary nodule is located within the superior segment of left lower lobe and measures 1.2 x 0.7 by 0.7 cm. Numerous other subpleural soft tissue and ground-glass few mm nodules scattered throughout both lungs. There is a prominent lymph node within the anterior mediastinal pericardial fat. No axillary lymphadenopathy is seen. The visualized portions of the thyroid gland are unremarkable in appearance. The visualized portions of the liver and spleen are unremarkable. The visualized portions of the pancreas, gallbladder, stomach, adrenal glands and kidneys are within normal limits. There is a 3 mm sclerotic focus within the posterior aspect of T4 vertebral body. IMPRESSION:  Mediastinal and bilateral hilar lymphadenopathy. 1.2 cm soft tissue mass within superior segment of left lower lobe. Numerous other bilateral mostly subpleural soft tissue and ground-glass few mm pulmonary nodules. 3 mm sclerotic focus within T4 vertebral body. Overall appearance is suspicious for metastatic disease, or less likely primary lung malignancy. Alternatively, pulmonary sarcoidosis may have a similar appearance. Evaluation with PET-CT and/or tissue diagnosis may be considered. Electronically Signed By: Fidela Salisbury M.D. On: 12/26/2014 15:12   Ct Abdomen Pelvis W Contrast  12/26/2014 CLINICAL DATA: 60 year old female with mediastinal adenopathy seen on the chest CT performed earlier the liver, gallbladder, pancreas, spleen, adrenal glands, kidneys, visualized ureters, and urinary bladder appear unremarkable. Hysterectomy. Today. EXAM: CT ABDOMEN AND PELVIS WITH CONTRAST TECHNIQUE: Multidetector CT imaging of the abdomen and pelvis was performed using the standard protocol following bolus administration of intravenous contrast. CONTRAST: 25mL OMNIPAQUE IOHEXOL 300 MG/ML SOLN COMPARISON: CT dated 11/28/2013 FINDINGS: No intra-abdominal free air or free fluid. The liver, gallbladder, pancreas, spleen, adrenal glands, kidneys, visualized ureters, and urinary bladder appear unremarkable. Hysterectomy. There is irregular and thickened appearance of the wall of the cecum and proximal colon. Although this may be related to presence of residual fecal material the cecal mass is not excluded. Colonoscopy is recommended for better evaluation. There is no evidence of bowel obstruction. The appendix is unremarkable. The abdominal aorta and IVC are patent. A mildly enlarged lymph nodes noted in the region of porta hepatis. Top-normal peripancreatic and portacaval lymph nodes noted,  nonspecific. Midline vertical anterior pelvic wall incisional scar. There is mild degenerative changes of  the spine with no acute fracture. IMPRESSION: Apparent irregularity and thickening of the cecum and ascending colon possibly related to residual fecal content. A cecal mass is not excluded. Colonoscopy recommended for better evaluation. Mildly prominent lymph nodes in the porta hepaticus, nonspecific. No acute intra-abdominal or pelvic pathology identified. Electronically Signed By: Anner Crete M.D. On: 12/26/2014 19:12     Admission HPI: Patient is a 60 yo F with a PMHx of hypothyroidism, GERD, eosinophilic esophagitis presenting to [facility] with progressive SOB and chest pain for the past few weeks. States the pain in her chest is epigastric and substernal. Denies having any cough or sputum production. Patient has a history of acid reflux and currently taking Cimetidine. She was diagnosed with eosinophilic esophagitis in 0109 and biopsy at the time showed inflammation and presence of Schatzki ring. Patient is also complaining of dysphagia to both solids and liquids and hoarseness of voice. States she has noticed a mass on the right side of her neck for the past 1 year, which is painful sometimes. Also complaining of sore throat associated with right ear pain. States she went to see her dentist 3 weeks ago and they checked her for TMJ but she was not told anything about the neck mass. No history of smoking, alcohol, or drug use. Never used any chewing tobacco. States she has been feeling weak and tired for several months. Denies having any fevers or night sweats. Denies any loss of appetite or weight loss. States she noticed blood in her stool back in 06/2014 and was told by GI to get a colonoscopy done. However, due to lack of insurance she never got it done. Patient had a partial hysterectomy in 2002 due to history of fibroids. States her mother had blood cancer (myelodysplastic syndrome?) and died at the age of 72. States she was last sexually active 1 year ago, female partner. As per  patient, she was this partner for 8 years and he had sexual relations with other people. During physical exam, it was noted that the patient had a wart on the plantar surface of her foot.   Hospital Course by problem list: Active Problems:  Mediastinal lymphadenopathy  Lung mass   Mediastinal Lymphadenopathy Patient presented with progressive chest pain and SOB. CT showed Mediastinal and bilateral hilar lymphadenopathy.1.2 cm soft tissue mass within superior segment of left lower lobe. Numerous other bilateral mostly subpleural soft tissue and ground-glass few mm pulmonary nodules.3 mm sclerotic focus within T4 vertebral body. Overall appearance suspicious for metastatic disease, or less likely primary lung malignancy. CT of head and neck negative for any pathologic lymphadenopathy. Patient is a never smoker. She did not have external lymphadenopathhy on exam. ACE level high (99). Normal CBC and blood chemistry profile. Labs including LDH, Uric acid, LFTs, INR, aPTT , Mag, Phos, and Prealbumin normal. Scr 0.73. CT scan of the neck, abdomen, and pelvis was remarkable for a thickened wall of the cecum and proximal colon, however, the appearance of the pathology in the lung is not suggestive of metastatic colon cancer. Patient was seen by pulmonology (Dr. Vaughan Browner) and he suggested outpatient PET scan and bronchoscopy with biopsy. Patient remained clinically stable - vitals were stable and she was not hypoxic at rest. No evidence of superior vena cava syndrome. Pt is to follow up with Pulmonology as outpatient. Dr. Matilde Bash office will call her the week of 12/30/14 to schedule these  procedures. Patient was advised to call pulmonology office if she did not hear back from them by Tuesday 12/31/14. In addition, she was provided with our attending Dr. Azucena Freed cell phone number in case she had any questions or had trouble making an appointment with Pulmonology.   GERD -continued home med Famotidine 20 mg  qd   Hypothyroidism TSH normal.  -continued home med Synthroid 125 mcg qd         01/01/2015 1st [facility] office visit/ Katrina [name]   Chief Complaint  Patient presents with  . HFU    Pt states that she has had a HA every day since her hospital d/c. Her breathing has improved over the past 2 days. She states having pain in between her shoulder blades and chest tightness on and off.   sob is 24 /7 and just as bad walking as sitting assoc with epigastric / midline cp that rad to back assoc with dysphagia and can't take ppi (at least not omeprazole) s immediate swelling  Imp: prob sarcoid / ? uacs  rec Stop all advil and aleve and take tylenol  Pepcid 20 mg after bfast and after supper  GERD   Need to keep appt to get PET scan and be sure they send me a copy   01/15/15 > nl airways/  EBUS bx's > granulomatous changes only      01/20/2015  f/u ov/Katrina [name] re: uacs ? Related to gerd/ prob sarcoid  Chief Complaint  Patient presents with  . HFU    Pt c/o increased cough, wheezing and congestion since hospital d/c. Cough is prod with large amounts of yellow sputum.    Using lots of cough drops / some proair since procedure rec Tagamet 200  X 2 after breakfast and x 2 after supper Prednisone 10 mg take  4 each am x 2 days,   2 each am x 2 days,  1 each am x 2 days and stop  Please schedule a follow up office visit in 6 weeks, call sooner if needed with cxr on return     03/04/2015  f/u ov/Katrina [name] re: sarcoid/ all smiles / no need for rescue  Chief Complaint  Patient presents with  . Follow-up    Cough has completely resolved. No new co's today.   rec  Stay on tagamet and diet  Please schedule a follow up visit in 3 months but call sooner if needed with cxr on return    06/09/2015 acute extended ov/Katrina [name] re:  Tagamet 200 mg one twice daily until got sick then 400 bid Chief Complaint  Patient presents with  . Acute Visit    Pt states she has been sick since 06/04/15- ear ache and  congestion- was txed with doxy and pred taper. She states that since then symptoms have settled in her chest- she wakes up SOB, has chest tightness and has prod cough with clear sputum.    similar illness in January 2017 got shot of pred/ po course resolved completely  06/03/15 exp to dust> intching/ sneezing /coughing > all better with otc's except for the cough  06/04/15 rx doxy/pred  - worse p albuterol  Worse breathing/coughing  is 3 am  rec Depomedrol 120 mg IM Stop the tagamet and take zantac 300 mg after bfast and at bedtime  Take delsym two tsp every 12 hours and supplement if needed with  tramadol 50 mg up to 2 every 4 hours  Please schedule a follow up office visit in  2 weeks, sooner if needed - we will do additional allergy tests then    06/27/2015  f/u ov/Katrina [name] re: sarcoid / prob asymptomatic   Chief Complaint  Patient presents with  . Follow-up    Pt states that her breathing has improved. She is no longer coughing.      No obvious day to day or daytime variability or assoc excess/ purulent sputum or mucus plugs   chest tightness, subjective wheeze or overt sinus or hb symptoms. No unusual exp hx or h/o childhood pna/ asthma or knowledge of premature birth.  Sleeping ok without nocturnal  or early am exacerbation  of respiratory  c/o's or need for noct saba. Also denies any obvious fluctuation of symptoms with weather or environmental changes or other aggravating or alleviating factors except as outlined above   Current Medications, Allergies, Complete Past Medical History, Past Surgical History, Family History, and Social History were reviewed in [employer] record.   ROS  The following are not active complaints unless bolded sore throat, dysphagia, dental problems, itching, sneezing,  nasal congestion or excess/ purulent secretions, ear ache,   fever, chills, sweats, unintended wt loss, classically pleuritic or exertional cp, hemoptysis,  orthopnea pnd or  leg swelling, presyncope, palpitations, abdominal pain, anorexia, nausea, vomiting, diarrhea  or change in bowel or bladder habits, change in stools or urine, dysuria,hematuria,  rash, arthralgias, visual complaints, headache, numbness, weakness or ataxia or problems with walking or coordination,  change in mood/affect or memory.               Objective:   Physical Exam  anxious amb wf nad   01/20/2015       186> 03/04/2015   187 > 06/09/2015  187 > 06/27/2015  198     01/01/15 83.462 kg (184 lb)  12/26/14 84.2 kg (185 lb 10 oz)  12/02/14 84.511 kg (186 lb 5 oz)    Vital signs reviewed   HEENT: nl dentition, turbinates, and orophanx. Nl external ear canals without cough reflex   NECK :  without JVD/Nodes/TM/ nl carotid upstrokes bilaterally   LUNGS: no acc muscle use, clear to A and P bilaterally without cough on insp or exp maneuvers     CV:  RRR  no s3 or murmur or increase in P2, no edema   ABD:  soft and nontender with nl excursion in the supine position. 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 Stable slight interstitial prominence       Assessment:

## 2015-06-27 NOTE — Patient Instructions (Signed)
If you are satisfied with your treatment plan,  let your doctor know and he/she can either refill your medications or you can return here when your prescription runs out.     If in any way you are not 100% satisfied,  please tell us.  If 100% better, tell your friends!  Pulmonary follow up is as needed   

## 2015-06-27 NOTE — Assessment & Plan Note (Signed)
See CT chest 12/26/14 12/26/14 vs last pCxR 10/2012 and last PA and Lat view in 02/2011  - ACE level 99 on 12/27/14  - ESR 22  01/01/2015  - PET 10/17/08 > Hypermetabolic paratracheal, subcarinal, AP window, hilar, infrahilar, and porta hepatis adenopathy. There are multiple tiny pulmonary nodules and a larger nodule along the left major fissure ; superior segment left lower lobe nodule along the major fissure is mildly hypermetabolic. Differential diagnostic considerations include lymphoma, sarcoidosis, or an unusual pattern for metastatic lung cancer. Tissue diagnosis recommended. - EBUS   9/28 /16 > granulomatous inflammation     No convincing evidence of symptoms/ natural hx reviewed.  A good rule of thumb is that >95% of pts with active sarcoid in any organ will have or  active pulmonary symptoms the cxr will look much worse than the patient:  No evidence of either scenario here/ strongly doubt active dz  > f/u can be prn cough while on adequate gerd rx or decline in activity tol

## 2015-06-27 NOTE — Assessment & Plan Note (Signed)
Spirometry 06/09/2015  wnl including [id number] while "wheezing" - NO 06/09/2015 = 14   Ok control on h2 bid/ pulmonary f/u is prn

## 2015-10-07 ENCOUNTER — Telehealth: Payer: Self-pay | Admitting: Internal Medicine

## 2015-10-07 NOTE — Telephone Encounter (Signed)
Pt wants to keep appt with MW for 10/08/15 to follow up on her breathing in addition to discussing the Thyroid refill.  Pt states that she has been unable to get set up with a PCP yet but does need her medication.  Pt to come for appt 10/08/15 to discuss issues with MW.  Nothing further needed.

## 2015-10-07 NOTE — Telephone Encounter (Signed)
If she's coming in tomorrow just needs to hold off on refill until sort out what she really needs - it takes 4-6 weeks of missing doses before it really matters  If the purpose of the visit tomorrow is just to get the thyroid until her PC appt ok to cancel appt and just given her one m supply s refills

## 2015-10-07 NOTE — Telephone Encounter (Signed)
Spoke with pt and she states that MW told her at [place] that he could refill her Synthroid. Pt is out of medication and has not established with PCP. She made appt this morning with Dr Carlisle Cater on 6/27. She needs enough to get her to that appt so that she can then have them take over management. Explained to pt that we do not typically monitor thyroid since she has only been seen here for pulmonary issues. Per chart pt has not had TSH labs since 9/16. Pt has appt with MW tomorrow at 2pm that she also made this morning.   MW - please advise. Thanks!   LOV  06/27/15  ROV  10/08/15 @ 2pm   Patient Instructions     If you are satisfied with your treatment plan, let your doctor know and he/she can either refill your medications or you can return here when your prescription runs out.   If in any way you are not 100% satisfied, please tell us. If 100% better, tell your friends!  Pulmonary follow up is as needed

## 2015-10-08 ENCOUNTER — Ambulatory Visit (INDEPENDENT_AMBULATORY_CARE_PROVIDER_SITE_OTHER)
Admission: RE | Admit: 2015-10-08 | Discharge: 2015-10-08 | Disposition: A | Discharge status: Home / Self Care | Payer: Self-pay | Source: Ambulatory Visit | Attending: Internal Medicine | Admitting: Internal Medicine

## 2015-10-08 ENCOUNTER — Encounter: Payer: Self-pay | Admitting: Internal Medicine

## 2015-10-08 ENCOUNTER — Other Ambulatory Visit (INDEPENDENT_AMBULATORY_CARE_PROVIDER_SITE_OTHER): Payer: Self-pay

## 2015-10-08 ENCOUNTER — Ambulatory Visit (INDEPENDENT_AMBULATORY_CARE_PROVIDER_SITE_OTHER): Payer: Self-pay | Admitting: Internal Medicine

## 2015-10-08 VITALS — BP 138/82 | HR 65 | Ht 62.5 in | Wt 190.0 lb

## 2015-10-08 DIAGNOSIS — R058 Other specified cough: Secondary | ICD-10-CM

## 2015-10-08 DIAGNOSIS — E038 Other specified hypothyroidism: Secondary | ICD-10-CM

## 2015-10-08 DIAGNOSIS — M791 Myalgia, unspecified site: Secondary | ICD-10-CM

## 2015-10-08 DIAGNOSIS — E039 Hypothyroidism, unspecified: Secondary | ICD-10-CM

## 2015-10-08 DIAGNOSIS — D860 Sarcoidosis of lung: Secondary | ICD-10-CM

## 2015-10-08 DIAGNOSIS — R05 Cough: Secondary | ICD-10-CM

## 2015-10-08 LAB — SEDIMENTATION RATE: SED RATE: 30 mm/h (ref 0–30)

## 2015-10-08 LAB — TSH: TSH: 1.43 u[IU]/mL (ref 0.35–4.50)

## 2015-10-08 MED ORDER — NORTRIPTYLINE HCL 25 MG PO CAPS: 25.0000 mg | ORAL_CAPSULE | Freq: Every day | ORAL | Status: DC

## 2015-10-08 MED ORDER — LEVOTHYROXINE SODIUM 125 MCG PO TABS: 125.0000 ug | ORAL_TABLET | Freq: Every day | ORAL | Status: DC

## 2015-10-08 NOTE — Progress Notes (Signed)
Subjective:     Patient ID: Katrina [name], female   DOB: March 01, 1956    MRN: [id number]    History of Present Illness  56 yowf never smoker with hx eos esophagitis and doe onset in June 2016 then midline cp while off gerd rx    Date of Admission: 12/26/2014   Date of Discharge: 12/27/2014    Discharge Diagnosis:  Active Problems:  Mediastinal lymphadenopathy  Lung mass  Discharge Medications:   Medication List    STOP taking these medications       lansoprazole 15 MG capsule  Commonly known as: PREVACID     omeprazole-sodium bicarbonate 40-1100 MG per capsule  Commonly known as: ZEGERID     sucralfate 1 GM/10ML suspension  Commonly known as: CARAFATE      TAKE these medications       cimetidine 200 MG tablet  Commonly known as: TAGAMET  Take 200 mg by mouth daily as needed (acid reflux).     levothyroxine 125 MCG tablet  Commonly known as: SYNTHROID, LEVOTHROID  Take 125 mcg by mouth daily before breakfast.        Disposition and follow-up:  [name] was discharged from [facility] in Stable condition. At the hospital follow up visit please address:  1. Mediastinal lymphadenopathy Patient is to follow up with Pulmonology (Dr. Vaughan Browner) as outpatient. His office will be calling the patient with an appointment date for PET scan and Bronchoscopy with Biopsy this week.   2. Labs / imaging needed at time of follow-up: PET scan, Bronchoscopy with Biopsy  3. Pending labs/ test needing follow-up: HIV antibody, Hep C antibody   Follow-up Appointments:     Follow-up Information    Follow up with Marshell Garfinkel, MD.   Specialty: Pulmonary Disease   Why: Pulmonology office will call you with an appointment date next week.    Contact information:   [address] [place] [address] [phone]       Discharge Instructions: Please follow up with Pulmonology as  outpatient. Dr. Matilde Bash office will call you next with an appointment date for PET scan and Bronchoscopy with biopsy. If you do not hear back from them by Tuesday 12/31/14, PLEASE CALL THEIR OFFICE TO SCHEDULE AN APPOINTMENT.   [facility]  [address] [place] [address] [phone]  If you have any problems contacting Pulmonology or have any questions, please do not hesitate to call Dr. Beryle Beams at [phone].  Consultations:   Pulmonology (Dr. Vaughan Browner)  Procedures Performed:   Imaging Results    Dg Chest 2 View  12/26/2014 CLINICAL DATA: Mid chest pain and shortness of breath. EXAM: CHEST 2 VIEW COMPARISON: Chest x-rays dated 11/08/2012 and 02/22/2011 FINDINGS: There is new cardiomegaly since the prior exams. The patient also has right hilar adenopathy with a possible mass at the inferior aspect of the right hilum. There is fullness in the azygos region. Density overlying the lingula on the PA view is most likely due to a cardiac lead electrode. No effusions. No osseous abnormality. IMPRESSION: New right hilar and mediastinal adenopathy. Probable mass at the inferior aspect of the right hilum. Findings are worrisome for malignancy. Electronically Signed By: Lorriane Shire M.D. On: 12/26/2014 12:43   Ct Soft Tissue Neck W Contrast  12/26/2014 CLINICAL DATA: Mediastinal and hilar lymphadenopathy. EXAM: CT NECK WITH CONTRAST TECHNIQUE: Multidetector CT imaging of the neck was performed using the standard protocol following the bolus administration of  intravenous contrast. CONTRAST: 22mL OMNIPAQUE IOHEXOL 300 MG/ML SOLN COMPARISON: None. FINDINGS: Pharynx and larynx: Normal. Salivary glands: Normal. Thyroid: Atrophic. No nodules. Lymph nodes: Normal. No adenopathy in the neck. Vascular: Normal. Limited intracranial: Normal. Visualized orbits: Normal. Mastoids and visualized paranasal sinuses: Normal. Skeleton: Degenerative disc disease  at C5-6 and C6-7. Otherwise normal. Upper chest: 3.6 mm nodule in the posterior aspect of the right upper lobe on image 106 of series 201. This is visible on the prior chest CT. IMPRESSION: Negative CT scan of the neck. No pathologic adenopathy. Electronically Signed By: Lorriane Shire M.D. On: 12/26/2014 19:08   Ct Chest W Contrast  12/26/2014 CLINICAL DATA: New right hilar and mediastinal adenopathy seen on chest x-ray. Shortness of breath for 2 months. EXAM: CT CHEST WITH CONTRAST TECHNIQUE: Multidetector CT imaging of the chest was performed during intravenous contrast administration. CONTRAST: 13mL OMNIPAQUE IOHEXOL 300 MG/ML SOLN COMPARISON: Chest x-ray dated 12/26/2014 FINDINGS: There is no evidence of central pulmonary embolus. The heart and great vessels are normal in appearance. There is a mediastinal and bilateral hilar lymphadenopathy. Enlarged pretracheal an perivascular lymph nodes are seen within the mediastinum. The largest conglomerate of lymph nodes is seen in the right hilum and measures 3.3 x 2.2 cm. There is no evidence of significant focal lung parenchymal consolidation, pleural effusion or pneumothorax. There are however numerous bilateral subpleural and paraseptal pulmonary nodules. The index pulmonary nodule is located within the superior segment of left lower lobe and measures 1.2 x 0.7 by 0.7 cm. Numerous other subpleural soft tissue and ground-glass few mm nodules scattered throughout both lungs. There is a prominent lymph node within the anterior mediastinal pericardial fat. No axillary lymphadenopathy is seen. The visualized portions of the thyroid gland are unremarkable in appearance. The visualized portions of the liver and spleen are unremarkable. The visualized portions of the pancreas, gallbladder, stomach, adrenal glands and kidneys are within normal limits. There is a 3 mm sclerotic focus within the posterior aspect of T4 vertebral body. IMPRESSION:  Mediastinal and bilateral hilar lymphadenopathy. 1.2 cm soft tissue mass within superior segment of left lower lobe. Numerous other bilateral mostly subpleural soft tissue and ground-glass few mm pulmonary nodules. 3 mm sclerotic focus within T4 vertebral body. Overall appearance is suspicious for metastatic disease, or less likely primary lung malignancy. Alternatively, pulmonary sarcoidosis may have a similar appearance. Evaluation with PET-CT and/or tissue diagnosis may be considered. Electronically Signed By: Fidela Salisbury M.D. On: 12/26/2014 15:12   Ct Abdomen Pelvis W Contrast  12/26/2014 CLINICAL DATA: 60 year old female with mediastinal adenopathy seen on the chest CT performed earlier the liver, gallbladder, pancreas, spleen, adrenal glands, kidneys, visualized ureters, and urinary bladder appear unremarkable. Hysterectomy. Today. EXAM: CT ABDOMEN AND PELVIS WITH CONTRAST TECHNIQUE: Multidetector CT imaging of the abdomen and pelvis was performed using the standard protocol following bolus administration of intravenous contrast. CONTRAST: 25mL OMNIPAQUE IOHEXOL 300 MG/ML SOLN COMPARISON: CT dated 11/28/2013 FINDINGS: No intra-abdominal free air or free fluid. The liver, gallbladder, pancreas, spleen, adrenal glands, kidneys, visualized ureters, and urinary bladder appear unremarkable. Hysterectomy. There is irregular and thickened appearance of the wall of the cecum and proximal colon. Although this may be related to presence of residual fecal material the cecal mass is not excluded. Colonoscopy is recommended for better evaluation. There is no evidence of bowel obstruction. The appendix is unremarkable. The abdominal aorta and IVC are patent. A mildly enlarged lymph nodes noted in the region of porta hepatis. Top-normal peripancreatic and portacaval lymph nodes noted,  nonspecific. Midline vertical anterior pelvic wall incisional scar. There is mild degenerative changes of  the spine with no acute fracture. IMPRESSION: Apparent irregularity and thickening of the cecum and ascending colon possibly related to residual fecal content. A cecal mass is not excluded. Colonoscopy recommended for better evaluation. Mildly prominent lymph nodes in the porta hepaticus, nonspecific. No acute intra-abdominal or pelvic pathology identified. Electronically Signed By: Anner Crete M.D. On: 12/26/2014 19:12     Admission HPI: Patient is a 60 yo F with a PMHx of hypothyroidism, GERD, eosinophilic esophagitis presenting to [facility] with progressive SOB and chest pain for the past few weeks. States the pain in her chest is epigastric and substernal. Denies having any cough or sputum production. Patient has a history of acid reflux and currently taking Cimetidine. She was diagnosed with eosinophilic esophagitis in 0109 and biopsy at the time showed inflammation and presence of Schatzki ring. Patient is also complaining of dysphagia to both solids and liquids and hoarseness of voice. States she has noticed a mass on the right side of her neck for the past 1 year, which is painful sometimes. Also complaining of sore throat associated with right ear pain. States she went to see her dentist 3 weeks ago and they checked her for TMJ but she was not told anything about the neck mass. No history of smoking, alcohol, or drug use. Never used any chewing tobacco. States she has been feeling weak and tired for several months. Denies having any fevers or night sweats. Denies any loss of appetite or weight loss. States she noticed blood in her stool back in 06/2014 and was told by GI to get a colonoscopy done. However, due to lack of insurance she never got it done. Patient had a partial hysterectomy in 2002 due to history of fibroids. States her mother had blood cancer (myelodysplastic syndrome?) and died at the age of 72. States she was last sexually active 1 year ago, female partner. As per  patient, she was this partner for 8 years and he had sexual relations with other people. During physical exam, it was noted that the patient had a wart on the plantar surface of her foot.   Hospital Course by problem list: Active Problems:  Mediastinal lymphadenopathy  Lung mass   Mediastinal Lymphadenopathy Patient presented with progressive chest pain and SOB. CT showed Mediastinal and bilateral hilar lymphadenopathy.1.2 cm soft tissue mass within superior segment of left lower lobe. Numerous other bilateral mostly subpleural soft tissue and ground-glass few mm pulmonary nodules.3 mm sclerotic focus within T4 vertebral body. Overall appearance suspicious for metastatic disease, or less likely primary lung malignancy. CT of head and neck negative for any pathologic lymphadenopathy. Patient is a never smoker. She did not have external lymphadenopathhy on exam. ACE level high (99). Normal CBC and blood chemistry profile. Labs including LDH, Uric acid, LFTs, INR, aPTT , Mag, Phos, and Prealbumin normal. Scr 0.73. CT scan of the neck, abdomen, and pelvis was remarkable for a thickened wall of the cecum and proximal colon, however, the appearance of the pathology in the lung is not suggestive of metastatic colon cancer. Patient was seen by pulmonology (Dr. Vaughan Browner) and he suggested outpatient PET scan and bronchoscopy with biopsy. Patient remained clinically stable - vitals were stable and she was not hypoxic at rest. No evidence of superior vena cava syndrome. Pt is to follow up with Pulmonology as outpatient. Dr. Matilde Bash office will call her the week of 12/30/14 to schedule these  procedures. Patient was advised to call pulmonology office if she did not hear back from them by Tuesday 12/31/14. In addition, she was provided with our attending Dr. Azucena Freed cell phone number in case she had any questions or had trouble making an appointment with Pulmonology.   GERD -continued home med Famotidine 20 mg  qd   Hypothyroidism TSH normal.  -continued home med Synthroid 125 mcg qd         01/01/2015 1st [facility] office visit/ Camellia Popescu   Chief Complaint  Patient presents with  . HFU    Pt states that she has had a HA every day since her hospital d/c. Her breathing has improved over the past 2 days. She states having pain in between her shoulder blades and chest tightness on and off.   sob is 24 /7 and just as bad walking as sitting assoc with epigastric / midline cp that rad to back assoc with dysphagia and can't take ppi (at least not omeprazole) s immediate swelling  Imp: prob sarcoid / ? uacs  rec Stop all advil and aleve and take tylenol  Pepcid 20 mg after bfast and after supper  GERD   Need to keep appt to get PET scan and be sure they send me a copy   01/15/15 > nl airways/  EBUS bx's > granulomatous changes only      01/20/2015  f/u ov/Masami Plata re: uacs ? Related to gerd/ prob sarcoid  Chief Complaint  Patient presents with  . HFU    Pt c/o increased cough, wheezing and congestion since hospital d/c. Cough is prod with large amounts of yellow sputum.    Using lots of cough drops / some proair since procedure rec Tagamet 200  X 2 after breakfast and x 2 after supper Prednisone 10 mg take  4 each am x 2 days,   2 each am x 2 days,  1 each am x 2 days and stop  Please schedule a follow up office visit in 6 weeks, call sooner if needed with cxr on return     03/04/2015  f/u ov/Tamarah Bhullar re: sarcoid/ all smiles / no need for rescue  Chief Complaint  Patient presents with  . Follow-up    Cough has completely resolved. No new co's today.   rec  Stay on tagamet and diet  Please schedule a follow up visit in 3 months but call sooner if needed with cxr on return    06/09/2015 acute extended ov/Brinleigh Tew re:  Tagamet 200 mg one twice daily until got sick then 400 bid Chief Complaint  Patient presents with  . Acute Visit    Pt states she has been sick since 06/04/15- ear ache and  congestion- was txed with doxy and pred taper. She states that since then symptoms have settled in her chest- she wakes up SOB, has chest tightness and has prod cough with clear sputum.    similar illness in January 2017 got shot of pred/ po course resolved completely  06/03/15 exp to dust> intching/ sneezing /coughing > all better with otc's except for the cough  06/04/15 rx doxy/pred  - worse p albuterol  Worse breathing/coughing  is 3 am  rec Depomedrol 120 mg IM Stop the tagamet and take zantac 300 mg after bfast and at bedtime  Take delsym two tsp every 12 hours and supplement if needed with  tramadol 50 mg up to 2 every 4 hours  Please schedule a follow up office visit in  2 weeks, sooner if needed - we will do additional allergy tests then    06/27/2015  f/u ov/Rock Sobol re: sarcoid / prob asymptomatic   Chief Complaint  Patient presents with  . Follow-up    Pt states that her breathing has improved. She is no longer coughing.   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 Follow-up    Pt c/o increased SOB with some CP since working in yard this past weekend. Pt also c/o increasing fatigue for several weeks. Pt denies cough/wheeze/tightness. Pt is concerned about her heart also being affected by sarcoid. Pt does not use rescue inhaler because she does not feel it helps her symptoms. Pt would also like to discuss thyroid meds.   main concern is leg pains after ex x years > ? Dx sle > rheum eval at [facility] > rx nortryptilline x one week resolved 20 y prior to [place]  But intermittently since then not necessarily related to ex and better with advil   Last ex 3 weeks prior to [place] due bilateral ant shin pain and hip pain feels similar in  Terms of location but now pattern is 24/7 better first thing in am  Over did it 10/05/15 > severe fatigue with sob/cough but since that day no more sob or cough / leg pains continue  though.  No obvious day to day or daytime variability or assoc excess/ purulent sputum or mucus plugs   chest tightness, subjective wheeze or overt sinus or hb symptoms. No unusual exp hx or h/o childhood pna/ asthma or knowledge of premature birth.  Sleeping ok without nocturnal  or early am exacerbation  of respiratory  c/o's or need for noct saba. Also denies any obvious fluctuation of symptoms with weather or environmental changes or other aggravating or alleviating factors except as outlined above   Current Medications, Allergies, Complete Past Medical History, Past Surgical History, Family History, and Social History were reviewed in [employer] record.   ROS  The following are not active complaints unless bolded sore throat, dysphagia, dental problems, itching, sneezing,  nasal congestion or excess/ purulent secretions, ear ache,   fever, chills, sweats, unintended wt loss, classically pleuritic or exertional cp, hemoptysis,  orthopnea pnd or leg swelling, presyncope, palpitations, abdominal pain, anorexia, nausea, vomiting, diarrhea  or change in bowel or bladder habits, change in stools or urine, dysuria,hematuria,  rash, arthralgias, visual complaints, headache, numbness, weakness or ataxia or problems with walking or coordination,  change in mood/affect or memory.               Objective:   Physical Exam  anxious amb wf nad   01/20/2015       186> 03/04/2015   187 > 06/09/2015  187 > 06/27/2015  198 > 10/08/2015  190    01/01/15 83.462 kg (184 lb)  12/26/14 84.2 kg (185 lb 10 oz)  12/02/14 84.511 kg (186 lb 5 oz)    Vital signs reviewed   HEENT: nl dentition, turbinates, and orophanx. Nl external ear canals without cough reflex   NECK :  without JVD/Nodes/TM/ nl carotid upstrokes bilaterally   LUNGS: no acc muscle use, clear to A and P bilaterally without cough on insp or exp maneuvers     CV:  RRR  no s3 or murmur or increase in P2, no edema  ABD:  soft and nontender with nl excursion in the supine position. No bruits or organomegaly, bowel sounds nl  MS:  warm without deformities, calf tenderness, cyanosis or clubbing  SKIN: warm and dry without lesions    NEURO:  alert, approp, no deficits        CXR PA and Lateral:   10/08/2015 :    I personally reviewed images and agree with radiology impression as follows:    stable minimally prominent interstitial markings and minimally prominent hila in this patient with history of sarcoidosis. No definite active process.        Lab Results  Component Value Date   ESRSEDRATE 30 10/08/2015   ESRSEDRATE 22 01/01/2015     Lab Results  Component Value Date   TSH 1.43 10/08/2015     Assessment:

## 2015-10-08 NOTE — Patient Instructions (Addendum)
nortryptilline 25 mg at bedtime   Please remember to go to the lab and x-ray department downstairs for your tests - we will call you with the results when they are available.      If you are satisfied with your treatment plan,  let your doctor know and he/she can either refill your medications or you can return here when your prescription runs out.     If in any way you are not 100% satisfied,  please tell us.  If 100% better, tell your friends!  Pulmonary follow up is as needed

## 2015-10-09 ENCOUNTER — Telehealth: Payer: Self-pay | Admitting: Internal Medicine

## 2015-10-09 DIAGNOSIS — M791 Myalgia, unspecified site: Secondary | ICD-10-CM | POA: Insufficient documentation

## 2015-10-09 DIAGNOSIS — M79606 Pain in leg, unspecified: Secondary | ICD-10-CM | POA: Insufficient documentation

## 2015-10-09 LAB — ANGIOTENSIN CONVERTING ENZYME: Angiotensin-Converting Enzyme: 89 U/L — ABNORMAL HIGH (ref 8–52)

## 2015-10-09 NOTE — Assessment & Plan Note (Signed)
Adequate control on present rx, reviewed > no change in rx needed  = 125 mcg per day > Follow up per Primary Care planned

## 2015-10-09 NOTE — Assessment & Plan Note (Signed)
Spirometry 06/09/2015  wnl including [id number] while "wheezing" - NO 06/09/2015 = 14   Well controlled on gerd rx > no change needed

## 2015-10-09 NOTE — Telephone Encounter (Signed)
Patient notified of lab and xray results. Nothing further needed.

## 2015-10-09 NOTE — Progress Notes (Signed)
Quick Note:  ATC, NA and no VM set up ______

## 2015-10-09 NOTE — Progress Notes (Signed)
Quick Note:  ATC, NA and VM not set up ______

## 2015-10-09 NOTE — Assessment & Plan Note (Signed)
Prev w/u neg at [facility], responded to TCA's ? Dose, try nortryptilline 25 mg qhs > Follow up per Primary Care planned

## 2015-10-09 NOTE — Progress Notes (Signed)
Quick Note:  ATC NA and VM not set up yet ______ 

## 2015-10-09 NOTE — Assessment & Plan Note (Signed)
See CT chest 12/26/14 12/26/14 vs last pCxR 10/2012 and last PA and Lat view in 02/2011  - ACE level 99 on 12/27/14  - ESR 22  01/01/2015  - PET 2/52/71 > Hypermetabolic paratracheal, subcarinal, AP window, hilar, infrahilar, and porta hepatis adenopathy. There are multiple tiny pulmonary nodules and a larger nodule along the left major fissure ; superior segment left lower lobe nodule along the major fissure is mildly hypermetabolic. Differential diagnostic considerations include lymphoma, sarcoidosis, or an unusual pattern for metastatic lung cancer. Tissue diagnosis recommended. - EBUS   9/28 /16 > granulomatous inflammation     Still no convincing signs or symptoms of active sarcoid. The only resp problem she had was transient x one day while over doing it out in the heat and resolved when chilled out.   Do not rec further w/u or rx at this pont

## 2015-10-14 ENCOUNTER — Ambulatory Visit: Payer: Self-pay | Admitting: Adult Health

## 2015-10-15 ENCOUNTER — Telehealth: Payer: Self-pay | Admitting: Internal Medicine

## 2015-10-15 NOTE — Telephone Encounter (Signed)
Pt states she is having issues with epigastric pain and cramping. States she takes zantac 300mg  bid and it does not seem to help. States she tried gas-x and it seemed to help some. Pt states she has not taken prilosec otc, states it makes her swell. Pt states there does appear to be one specific area that is tender to touch. Pt wants to be seen asap. Pt scheduled to see Alonza Bogus PA 10/23/15@3pm . Pt aware of appt.

## 2015-10-20 ENCOUNTER — Encounter: Payer: Self-pay | Admitting: *Deleted

## 2015-10-23 ENCOUNTER — Ambulatory Visit: Payer: Self-pay | Admitting: Gastroenterology

## 2015-11-12 ENCOUNTER — Encounter: Payer: Self-pay | Admitting: Internal Medicine

## 2015-11-12 ENCOUNTER — Ambulatory Visit (INDEPENDENT_AMBULATORY_CARE_PROVIDER_SITE_OTHER): Payer: Self-pay | Admitting: Internal Medicine

## 2015-11-12 ENCOUNTER — Other Ambulatory Visit: Payer: Self-pay

## 2015-11-12 VITALS — BP 118/78 | HR 63 | Ht 62.5 in | Wt 189.0 lb

## 2015-11-12 DIAGNOSIS — Z1211 Encounter for screening for malignant neoplasm of colon: Secondary | ICD-10-CM

## 2015-11-12 DIAGNOSIS — R1013 Epigastric pain: Secondary | ICD-10-CM

## 2015-11-12 DIAGNOSIS — K222 Esophageal obstruction: Secondary | ICD-10-CM

## 2015-11-12 DIAGNOSIS — R1310 Dysphagia, unspecified: Secondary | ICD-10-CM

## 2015-11-12 NOTE — Patient Instructions (Signed)
  Your physician has requested that you go to the basement for the following lab work before leaving today: IFOB testing   Take a PPI of your choice for 2 months and then stop for 2 weeks and then come for an H. Pylori stool test.    Follow up with Dr Carlean Purl in 3 months.     I appreciate the opportunity to care for you. Silvano Rusk, MD, [facility]

## 2015-11-12 NOTE — Progress Notes (Signed)
Subjective:    Patient ID: Katrina [name], female    DOB: 1955/11/12, 60 y.o.   MRN: [id number]  CC: Abdominal pain  HPI Katrina [name] is a delightful 60 y.o. female presenting to the office for epigastric pain x 2 months, with a history of GERD, esophageal stricture (dilation in 2014), and esophagitis. Describes pain as a cramp and is worse after eating, while eating food relieves the pain. Does not associate any nausea, vomiting, or hematemesis. Pain does improve with Prilosec and has been taking it for past 14 days, having tired multple H2 blockers with no resolve. Has been having dysphagia for the past year avoiding meats and breads, but no difficulty with liquids. She has had no colon screening and did not preformed immunochemical FOBT in 2014 as ordered. Unfortunately she has no insurance and and wishes try for another FOBT for colon screening, as colonoscopy is too expensive .  Denies changes in bowels, weight loss, fever, chills, hematochezia, or melena.   Allergies  Allergen Reactions  . Penicillins Shortness Of Breath and Rash   Outpatient Medications Prior to Visit  Medication Sig Dispense Refill  . Aspirin-Acetaminophen-Caffeine (EXCEDRIN PO) Take 1-2 tablets by mouth as directed.     Marland Kitchen levothyroxine (SYNTHROID, LEVOTHROID) 125 MCG tablet Take 1 tablet (125 mcg total) by mouth daily before breakfast. Reported on 06/27/2015 30 tablet 0  . nortriptyline (PAMELOR) 25 MG capsule Take 1 capsule (25 mg total) by mouth at bedtime. 30 capsule 2  . PROAIR HFA 108 (90 BASE) MCG/ACT inhaler Inhale 2 puffs into the lungs every 4 (four) hours as needed.  0  . ranitidine (ZANTAC) 300 MG tablet Take 1 tablet (300 mg total) by mouth 2 (two) times daily. 60 tablet 2  . traMADol (ULTRAM) 50 MG tablet 1-2 every 4 hours as needed for cough or pain 40 tablet 0   No facility-administered medications prior to visit.    Past Medical History:  Diagnosis Date  . Chest pain at rest 11/07/2012  .  Eosinophilic esophagitis 0000000  . GERD (gastroesophageal reflux disease)   . Hypothyroidism   . Shortness of breath 11/08/2012   RECENTLY  & WHEN i EXERCISE"  . Thyroid disease    Past Surgical History:  Procedure Laterality Date  . ABDOMINAL HYSTERECTOMY    . ESOPHAGOGASTRODUODENOSCOPY (EGD) WITH ESOPHAGEAL DILATION N/A 11/10/2012   Procedure: ESOPHAGOGASTRODUODENOSCOPY (EGD) WITH ESOPHAGEAL DILATION;  Surgeon: Gatha Mayer, MD;  Location: [facility];  Service: Endoscopy;  Laterality: N/A;  [place] vs. Balloon  . VIDEO BRONCHOSCOPY WITH ENDOBRONCHIAL ULTRASOUND Bilateral 01/15/2015   Procedure: VIDEO BRONCHOSCOPY WITH ENDOBRONCHIAL ULTRASOUND of  LEFT LUNG LOWER LOBE and right lung;  Surgeon: Collene Gobble, MD;  Location: [facility];  Service: Thoracic;  Laterality: Bilateral;   Social History   Social History  . Marital status: Divorced    Spouse name: N/A  . Number of children: N/A  . Years of education: N/A   Social History Main Topics  . Smoking status: Never Smoker  . Smokeless tobacco: Never Used  . Alcohol use No  . Drug use: No  . Sexual activity: No   Other Topics Concern  . None   Social History Narrative  . None   Family History  Problem Relation Age of Onset  . Diabetes Mother   . Stroke Mother   . Coronary artery disease Father   . Coronary artery disease Brother     Review of Systems  See HPI, all other  systems negative    Objective:   Physical Exam  @BP  118/78   Pulse 63   Ht 5' 2.5" (1.588 m)   Wt 189 lb (85.7 kg)   BMI 34.02 kg/m @  General:  NAD Eyes:   anicteric Lungs:  clear Heart::  S1S2 no rubs, murmurs or gallops Abdomen:  soft and tenderness to Epigastic, BS+ Ext:   no edema, cyanosis or clubbing    Data Reviewed:  EGD from 11/10/12 showing stricture with dilation. GI Note from 12/02/14 CT Abdomen Pelvis with contrast from 12/26/14 PET scan from 01/07/15      Assessment & Plan:   Encounter Diagnoses  Name Primary?   Marland Kitchen Dysphagia Yes  . Esophageal ring, acquired   . Abdominal pain, epigastric   . Colon cancer screening    Epigastic pain: likely secondary to gastritis or PUD. Cancer is less likely having normal gastric anatomy on EGD in 11/11/15.  Only recently starting PPI stating that pain has improve on medication. Will have her continue PPI of her choice for 2 months and at that time stop for two weeks and obtain an H. Pylori stool antigen test. Have her follow up in October   Dysphagia: likely due to uncontrolled GERD. Given her history of stricture and taking H2 Blockers only, may need to have EGD and dilation. For now given her insurance status, she is willing to try the PPI for two months and follow up in October.  Colon Cancer Screen: Has had no colon screening. Given she has no insurance she is wanting to try a FOBT. Have ordered a immunochemical fecal occult blood test.  Grayland Ormond PA-s  I have seen the patient with Mr. Venetia Maxon and he has served as a Education administrator.  I appreciate the opportunity to care for this patient. [id number], Belenda Cruise, FNP

## 2015-11-13 ENCOUNTER — Other Ambulatory Visit: Payer: Self-pay

## 2015-11-13 DIAGNOSIS — R1013 Epigastric pain: Secondary | ICD-10-CM

## 2015-11-14 ENCOUNTER — Telehealth: Payer: Self-pay

## 2015-11-14 DIAGNOSIS — R1013 Epigastric pain: Secondary | ICD-10-CM

## 2015-11-14 DIAGNOSIS — Z1211 Encounter for screening for malignant neoplasm of colon: Secondary | ICD-10-CM

## 2015-11-14 LAB — HELICOBACTER PYLORI  SPECIAL ANTIGEN: H. PYLORI ANTIGEN STOOL: NOT DETECTED

## 2015-11-14 NOTE — Telephone Encounter (Signed)
Spoke with patient and she is going to come back and bring the IFOB container and pick up another stool container for the H. Pylori stool antigen test, orders in .  She thought she had brought back the IFOB container.  I went back over the instructions for when to collect the H. Pylori stool antigen test and she verbalized understanding.

## 2015-11-14 NOTE — Telephone Encounter (Signed)
  Left message for patient to call me.  The H. Pylori test was done too early.  The lab was supposed to run the IFOB now and the H. Pylori stool, antigen later.  The lab thinks she must have brought the wrong container back.  I will reorder both test.

## 2015-11-14 NOTE — Progress Notes (Signed)
H pylori test done too early - not supposed to have until she takes PPI x 2 months and then holds PPI x 2 weeks   Please have lab cancel charge for this and we need to do it as planned  She was supposed an iFOBT though

## 2015-11-25 ENCOUNTER — Other Ambulatory Visit (INDEPENDENT_AMBULATORY_CARE_PROVIDER_SITE_OTHER): Payer: Self-pay

## 2015-11-25 DIAGNOSIS — Z1211 Encounter for screening for malignant neoplasm of colon: Secondary | ICD-10-CM

## 2015-11-25 LAB — FECAL OCCULT BLOOD, IMMUNOCHEMICAL: Fecal Occult Bld: NEGATIVE

## 2015-11-25 NOTE — Progress Notes (Signed)
No blood in stool Should be repeated in 1 year Place a recall and I sent a My chart note

## 2015-12-08 ENCOUNTER — Other Ambulatory Visit: Payer: Self-pay

## 2015-12-08 ENCOUNTER — Ambulatory Visit (INDEPENDENT_AMBULATORY_CARE_PROVIDER_SITE_OTHER): Payer: Self-pay | Admitting: Family

## 2015-12-08 ENCOUNTER — Encounter: Payer: Self-pay | Admitting: Family

## 2015-12-08 VITALS — BP 138/88 | HR 57 | Temp 97.7°F | Resp 14 | Ht 62.5 in | Wt 194.8 lb

## 2015-12-08 DIAGNOSIS — M5441 Lumbago with sciatica, right side: Secondary | ICD-10-CM

## 2015-12-08 DIAGNOSIS — E038 Other specified hypothyroidism: Secondary | ICD-10-CM

## 2015-12-08 DIAGNOSIS — M5442 Lumbago with sciatica, left side: Secondary | ICD-10-CM

## 2015-12-08 DIAGNOSIS — K219 Gastro-esophageal reflux disease without esophagitis: Secondary | ICD-10-CM

## 2015-12-08 DIAGNOSIS — R350 Frequency of micturition: Secondary | ICD-10-CM

## 2015-12-08 DIAGNOSIS — E669 Obesity, unspecified: Secondary | ICD-10-CM

## 2015-12-08 LAB — POCT URINALYSIS DIPSTICK
Bilirubin, UA: NEGATIVE
Blood, UA: NEGATIVE
Glucose, UA: NEGATIVE
KETONES UA: NEGATIVE
LEUKOCYTES UA: NEGATIVE
Nitrite, UA: NEGATIVE
PROTEIN UA: NEGATIVE
Spec Grav, UA: >=1.03
Urobilinogen, UA: NEGATIVE
pH, UA: 6

## 2015-12-08 LAB — POCT GLYCOSYLATED HEMOGLOBIN (HGB A1C): Hemoglobin A1C: 5.7

## 2015-12-08 NOTE — Assessment & Plan Note (Signed)
Symptoms and exam consistent with low back pain most likely associated with muscle tightness and continued overuse while sitting and driving. Treat conservatively with ice/moist heat, home exercise therapy, and start Duexis. No imaging necessary at this time. Continue to monitor.

## 2015-12-08 NOTE — Assessment & Plan Note (Signed)
In office urinalysis negative for leukocytes, nitrites, and hematuria. Unlikely urinary tract infection with urine culture sent for confirmation. Obtain A1c to rule out diabetes. Recommend continuing to monitor pending urine culture results.

## 2015-12-08 NOTE — Assessment & Plan Note (Signed)
Hypothyroidism appears stable with current regimen with last TSH of 1.43. Continue current dosage of levothyroxine.

## 2015-12-08 NOTE — Progress Notes (Signed)
Subjective:    Patient ID: Katrina [name], female    DOB: 11-26-1955, 60 y.o.   MRN: [id number]  Chief Complaint  Patient presents with  . Establish Care    lower back pain that goes into the back of her legs, urinary frequency, some dysuria last night    HPI:  Katrina [name] is a 60 y.o. female who  has a past medical history of Chest pain at rest (11/07/2012); Eosinophilic esophagitis (0000000); GERD (gastroesophageal reflux disease); Hypothyroidism; Sarcoidosis ([place]); Shortness of breath (11/08/2012); and Thyroid disease. and presents today for an office visit to establish care.   1.) Back pain - This is a new problem. Previous history of SI joint pain. Currently experiencing the associated symptom of bilateral lower back pain with radiculopathy down both legs to her the calves. The pain is constant and described as achy. Modifying factors including ibuprofen. She believes that it may be related to long periods of driving. Denies any other trauma or injury that she can recall. No change in bowel/bladder or saddle anesthesia. She has had previous cortisone injections in her SI joint. She has had some increased urinary frequency and one episode of dysuria.   2.) Hypothyroidism - currently maintained on levothyroxine. Reports taking the medication as prescribed and denies adverse side effects.  Lab Results  Component Value Date   TSH 1.43 10/08/2015    3.) GERD - Currently maintained on omeprazole. Reports taking the medication as prescribed and denies adverse side effects. Symptoms are generally well controlled with the current regimen.    Allergies  Allergen Reactions  . Penicillins Shortness Of Breath and Rash     Outpatient Medications Prior to Visit  Medication Sig Dispense Refill  . Aspirin-Acetaminophen-Caffeine (EXCEDRIN PO) Take 1-2 tablets by mouth as directed.     Marland Kitchen levothyroxine (SYNTHROID, LEVOTHROID) 125 MCG tablet Take 1 tablet (125 mcg total) by mouth daily before  breakfast. Reported on 06/27/2015 30 tablet 0  . nortriptyline (PAMELOR) 25 MG capsule Take 1 capsule (25 mg total) by mouth at bedtime. 30 capsule 2  . PROAIR HFA 108 (90 BASE) MCG/ACT inhaler Inhale 2 puffs into the lungs every 4 (four) hours as needed.  0  . ranitidine (ZANTAC) 300 MG tablet Take 1 tablet (300 mg total) by mouth 2 (two) times daily. 60 tablet 2  . traMADol (ULTRAM) 50 MG tablet 1-2 every 4 hours as needed for cough or pain 40 tablet 0   No facility-administered medications prior to visit.      Past Medical History:  Diagnosis Date  . Chest pain at rest 11/07/2012  . Eosinophilic esophagitis 0000000  . GERD (gastroesophageal reflux disease)   . Hypothyroidism   . Sarcoidosis ([place])   . Shortness of breath 11/08/2012   RECENTLY  & WHEN i EXERCISE"  . Thyroid disease      Past Surgical History:  Procedure Laterality Date  . ABDOMINAL HYSTERECTOMY     Partial  . ESOPHAGOGASTRODUODENOSCOPY (EGD) WITH ESOPHAGEAL DILATION N/A 11/10/2012   Procedure: ESOPHAGOGASTRODUODENOSCOPY (EGD) WITH ESOPHAGEAL DILATION;  Surgeon: Gatha Mayer, MD;  Location: [facility];  Service: Endoscopy;  Laterality: N/A;  [place] vs. Balloon  . VIDEO BRONCHOSCOPY WITH ENDOBRONCHIAL ULTRASOUND Bilateral 01/15/2015   Procedure: VIDEO BRONCHOSCOPY WITH ENDOBRONCHIAL ULTRASOUND of  LEFT LUNG LOWER LOBE and right lung;  Surgeon: Collene Gobble, MD;  Location: [facility];  Service: Thoracic;  Laterality: Bilateral;     Family History  Problem Relation Age of Onset  .  Diabetes Mother   . Stroke Mother   . Coronary artery disease Father   . Coronary artery disease Brother   . Hypothyroidism Maternal Grandmother   . Diabetes Maternal Grandfather   . Heart disease Paternal Grandmother   . Heart disease Paternal Grandfather      Social History   Social History  . Marital status: Divorced    Spouse name: N/A  . Number of children: 3  . Years of education: 14   Occupational History  .  Unemployed    Social History Main Topics  . Smoking status: Never Smoker  . Smokeless tobacco: Never Used  . Alcohol use No  . Drug use: No  . Sexual activity: No   Other Topics Concern  . Not on file   Social History Narrative   Fun: Anything and everything     Review of Systems  Constitutional: Negative for chills and fever.  Endocrine: Negative for cold intolerance, heat intolerance, polydipsia, polyphagia and polyuria.  Musculoskeletal: Positive for back pain.  Neurological: Negative for weakness and numbness.      Objective:    BP 138/88 (BP Location: Left Arm, Patient Position: Sitting, Cuff Size: Large)   Pulse (!) 57   Temp 97.7 F (36.5 C) (Oral)   Resp 14   Ht 5' 2.5" (1.588 m)   Wt 194 lb 12.8 oz (88.4 kg)   SpO2 96%   BMI 35.06 kg/m  Nursing note and vital signs reviewed.  Physical Exam  Constitutional: She is oriented to person, place, and time. She appears well-developed and well-nourished. No distress.  Cardiovascular: Normal rate, regular rhythm, normal heart sounds and intact distal pulses.   Pulmonary/Chest: Effort normal and breath sounds normal.  Musculoskeletal:  Low back -no obvious deformity, discoloration, or edema noted. Tenderness noted bilaterally over lumbar paraspinal musculature with no other tenderness, crepitus, or deformities noted. Range of motion within normal limits with improvement of symptoms in extension. Distal pulses and sensation are intact and appropriate. Negative straight leg raise. Negative Faber's.  Neurological: She is alert and oriented to person, place, and time.  Skin: Skin is warm and dry.  Psychiatric: She has a normal mood and affect. Her behavior is normal. Judgment and thought content normal.       Assessment & Plan:   Problem List Items Addressed This Visit      Digestive   GERD    Symptoms or GERD appears stable with current regimen and no adverse side effects. Food choices to reduce GERD symptoms  provided and after visit summary. Continue current dosage of omeprazole.      Relevant Medications   omeprazole (PRILOSEC) 20 MG capsule     Endocrine   Hypothyroidism - Primary    Hypothyroidism appears stable with current regimen with last TSH of 1.43. Continue current dosage of levothyroxine.        Other   Low back pain    Symptoms and exam consistent with low back pain most likely associated with muscle tightness and continued overuse while sitting and driving. Treat conservatively with ice/moist heat, home exercise therapy, and start Duexis. No imaging necessary at this time. Continue to monitor.      Obesity    BMI of 35 indicating obesity. Recommend weight loss of 5-10% of current body weight through nutrition and physical activity changes. Recommend increasing physical activity to 30 minutes of moderate level activity daily. Encourage nutritional intake that focuses on nutrient dense foods and is moderate, varied, and balanced  and is low in saturated fats and processed/sugary foods. Obtain A1c to rule out Type 2 diabetes.       Relevant Orders   POCT HgB A1C (Completed)   Urinary frequency    In office urinalysis negative for leukocytes, nitrites, and hematuria. Unlikely urinary tract infection with urine culture sent for confirmation. Obtain A1c to rule out diabetes. Recommend continuing to monitor pending urine culture results.      Relevant Orders   POCT urinalysis dipstick (Completed)   Urine culture    Other Visit Diagnoses   None.      I have discontinued Ms. Rief's traMADol and ranitidine. I am also having her maintain her Aspirin-Acetaminophen-Caffeine (EXCEDRIN PO), PROAIR HFA, nortriptyline, levothyroxine, and omeprazole.   Meds ordered this encounter  Medications  . omeprazole (PRILOSEC) 20 MG capsule    Sig: Take 20 mg by mouth daily.     Follow-up: Return in about 1 month (around 01/08/2016), or if symptoms worsen or fail to improve.  Mauricio Po, FNP

## 2015-12-08 NOTE — Assessment & Plan Note (Signed)
BMI of 35 indicating obesity. Recommend weight loss of 5-10% of current body weight through nutrition and physical activity changes. Recommend increasing physical activity to 30 minutes of moderate level activity daily. Encourage nutritional intake that focuses on nutrient dense foods and is moderate, varied, and balanced and is low in saturated fats and processed/sugary foods. Obtain A1c to rule out Type 2 diabetes.

## 2015-12-08 NOTE — Patient Instructions (Addendum)
Thank you for choosing [facility].  Summary/Instructions:  Ice/moist heat 20 minutes every 2 hours as needed for discomfort.  Duexis 3 times per day until completed.  Over-the-counter icy hot or creams as needed.  Stretching exercises multiple times throughout the day.  Please continue to take your medication as prescribed.   Your prescription(s) have been submitted to your pharmacy or been printed and provided for you. Please take as directed and contact our office if you believe you are having problem(s) with the medication(s) or have any questions.  If your symptoms worsen or fail to improve, please contact our office for further instruction, or in case of emergency go directly to the emergency room at the closest medical facility.    Low Back Sprain With Rehab A sprain is an injury in which a ligament is torn. The ligaments of the lower back are vulnerable to sprains. However, they are strong and require great force to be injured. These ligaments are important for stabilizing the spinal column. Sprains are classified into three categories. Grade 1 sprains cause pain, but the tendon is not lengthened. Grade 2 sprains include a lengthened ligament, due to the ligament being stretched or partially ruptured. With grade 2 sprains there is still function, although the function may be decreased. Grade 3 sprains involve a complete tear of the tendon or muscle, and function is usually impaired. SYMPTOMS   Severe pain in the lower back.  Sometimes, a feeling of a "pop," "snap," or tear, at the time of injury.  Tenderness and sometimes swelling at the injury site.  Uncommonly, bruising (contusion) within 48 hours of injury.  Muscle spasms in the back. CAUSES  Low back sprains occur when a force is placed on the ligaments that is greater than they can handle. Common causes of injury include:  Performing a stressful act while off-balance.  Repetitive stressful activities that  involve movement of the lower back.  Direct hit (trauma) to the lower back. RISK INCREASES WITH:  Contact sports (football, wrestling).  Collisions (major skiing accidents).  Sports that require throwing or lifting (baseball, weightlifting).  Sports involving twisting of the spine (gymnastics, diving, tennis, golf).  Poor strength and flexibility.  Inadequate protection.  Previous back injury or surgery (especially fusion). PREVENTION  Wear properly fitted and padded protective equipment.  Warm up and stretch properly before activity.  Allow for adequate recovery between workouts.  Maintain physical fitness:  Strength, flexibility, and endurance.  Cardiovascular fitness.  Maintain a healthy body weight. PROGNOSIS  If treated properly, low back sprains usually heal with non-surgical treatment. The length of time for healing depends on the severity of the injury.  RELATED COMPLICATIONS   Recurring symptoms, resulting in a chronic problem.  Chronic inflammation and pain in the low back.  Delayed healing or resolution of symptoms, especially if activity is resumed too soon.  Prolonged impairment.  Unstable or arthritic joints of the low back. TREATMENT  Treatment first involves the use of ice and medicine, to reduce pain and inflammation. The use of strengthening and stretching exercises may help reduce pain with activity. These exercises may be performed at home or with a therapist. Severe injuries may require referral to a therapist for further evaluation and treatment, such as ultrasound. Your caregiver may advise that you wear a back brace or corset, to help reduce pain and discomfort. Often, prolonged bed rest results in greater harm then benefit. Corticosteroid injections may be recommended. However, these should be reserved for the most serious  cases. It is important to avoid using your back when lifting objects. At night, sleep on your back on a firm mattress, with  a pillow placed under your knees. If non-surgical treatment is unsuccessful, surgery may be needed.  MEDICATION   If pain medicine is needed, nonsteroidal anti-inflammatory medicines (aspirin and ibuprofen), or other minor pain relievers (acetaminophen), are often advised.  Do not take pain medicine for 7 days before surgery.  Prescription pain relievers may be given, if your caregiver thinks they are needed. Use only as directed and only as much as you need.  Ointments applied to the skin may be helpful.  Corticosteroid injections may be given by your caregiver. These injections should be reserved for the most serious cases, because they may only be given a certain number of times. HEAT AND COLD  Cold treatment (icing) should be applied for 10 to 15 minutes every 2 to 3 hours for inflammation and pain, and immediately after activity that aggravates your symptoms. Use ice packs or an ice massage.  Heat treatment may be used before performing stretching and strengthening activities prescribed by your caregiver, physical therapist, or athletic trainer. Use a heat pack or a warm water soak. SEEK MEDICAL CARE IF:   Symptoms get worse or do not improve in 2 to 4 weeks, despite treatment.  You develop numbness or weakness in either leg.  You lose bowel or bladder function.  Any of the following occur after surgery: fever, increased pain, swelling, redness, drainage of fluids, or bleeding in the affected area.  New, unexplained symptoms develop. (Drugs used in treatment may produce side effects.) EXERCISES  RANGE OF MOTION (ROM) AND STRETCHING EXERCISES - Low Back Sprain Most people with lower back pain will find that their symptoms get worse with excessive bending forward (flexion) or arching at the lower back (extension). The exercises that will help resolve your symptoms will focus on the opposite motion.  Your physician, physical therapist or athletic trainer will help you determine which  exercises will be most helpful to resolve your lower back pain. Do not complete any exercises without first consulting with your caregiver. Discontinue any exercises which make your symptoms worse, until you speak to your caregiver. If you have pain, numbness or tingling which travels down into your buttocks, leg or foot, the goal of the therapy is for these symptoms to move closer to your back and eventually resolve. Sometimes, these leg symptoms will get better, but your lower back pain may worsen. This is often an indication of progress in your rehabilitation. Be very alert to any changes in your symptoms and the activities in which you participated in the 24 hours prior to the change. Sharing this information with your caregiver will allow him or her to most efficiently treat your condition. These exercises may help you when beginning to rehabilitate your injury. Your symptoms may resolve with or without further involvement from your physician, physical therapist or athletic trainer. While completing these exercises, remember:   Restoring tissue flexibility helps normal motion to return to the joints. This allows healthier, less painful movement and activity.  An effective stretch should be held for at least 30 seconds.  A stretch should never be painful. You should only feel a gentle lengthening or release in the stretched tissue. FLEXION RANGE OF MOTION AND STRETCHING EXERCISES: STRETCH - Flexion, Single Knee to Chest   Lie on a firm bed or floor with both legs extended in front of you.  Keeping one leg  in contact with the floor, bring your opposite knee to your chest. Hold your leg in place by either grabbing behind your thigh or at your knee.  Pull until you feel a gentle stretch in your low back. Hold __________ seconds.  Slowly release your grasp and repeat the exercise with the opposite side. Repeat __________ times. Complete this exercise __________ times per day.  STRETCH - Flexion,  Double Knee to Chest  Lie on a firm bed or floor with both legs extended in front of you.  Keeping one leg in contact with the floor, bring your opposite knee to your chest.  Tense your stomach muscles to support your back and then lift your other knee to your chest. Hold your legs in place by either grabbing behind your thighs or at your knees.  Pull both knees toward your chest until you feel a gentle stretch in your low back. Hold __________ seconds.  Tense your stomach muscles and slowly return one leg at a time to the floor. Repeat __________ times. Complete this exercise __________ times per day.  STRETCH - Low Trunk Rotation  Lie on a firm bed or floor. Keeping your legs in front of you, bend your knees so they are both pointed toward the ceiling and your feet are flat on the floor.  Extend your arms out to the side. This will stabilize your upper body by keeping your shoulders in contact with the floor.  Gently and slowly drop both knees together to one side until you feel a gentle stretch in your low back. Hold for __________ seconds.  Tense your stomach muscles to support your lower back as you bring your knees back to the starting position. Repeat the exercise to the other side. Repeat __________ times. Complete this exercise __________ times per day  EXTENSION RANGE OF MOTION AND FLEXIBILITY EXERCISES: STRETCH - Extension, Prone on Elbows   Lie on your stomach on the floor, a bed will be too soft. Place your palms about shoulder width apart and at the height of your head.  Place your elbows under your shoulders. If this is too painful, stack pillows under your chest.  Allow your body to relax so that your hips drop lower and make contact more completely with the floor.  Hold this position for __________ seconds.  Slowly return to lying flat on the floor. Repeat __________ times. Complete this exercise __________ times per day.  RANGE OF MOTION - Extension, Prone Press  Ups  Lie on your stomach on the floor, a bed will be too soft. Place your palms about shoulder width apart and at the height of your head.  Keeping your back as relaxed as possible, slowly straighten your elbows while keeping your hips on the floor. You may adjust the placement of your hands to maximize your comfort. As you gain motion, your hands will come more underneath your shoulders.  Hold this position __________ seconds.  Slowly return to lying flat on the floor. Repeat __________ times. Complete this exercise __________ times per day.  RANGE OF MOTION- Quadruped, Neutral Spine   Assume a hands and knees position on a firm surface. Keep your hands under your shoulders and your knees under your hips. You may place padding under your knees for comfort.  Drop your head and point your tailbone toward the ground below you. This will round out your lower back like an angry cat. Hold this position for __________ seconds.  Slowly lift your head and release your  tail bone so that your back sags into a large arch, like an old horse.  Hold this position for __________ seconds.  Repeat this until you feel limber in your low back.  Now, find your "sweet spot." This will be the most comfortable position somewhere between the two previous positions. This is your neutral spine. Once you have found this position, tense your stomach muscles to support your low back.  Hold this position for __________ seconds. Repeat __________ times. Complete this exercise __________ times per day.  STRENGTHENING EXERCISES - Low Back Sprain These exercises may help you when beginning to rehabilitate your injury. These exercises should be done near your "sweet spot." This is the neutral, low-back arch, somewhere between fully rounded and fully arched, that is your least painful position. When performed in this safe range of motion, these exercises can be used for people who have either a flexion or extension based  injury. These exercises may resolve your symptoms with or without further involvement from your physician, physical therapist or athletic trainer. While completing these exercises, remember:   Muscles can gain both the endurance and the strength needed for everyday activities through controlled exercises.  Complete these exercises as instructed by your physician, physical therapist or athletic trainer. Increase the resistance and repetitions only as guided.  You may experience muscle soreness or fatigue, but the pain or discomfort you are trying to eliminate should never worsen during these exercises. If this pain does worsen, stop and make certain you are following the directions exactly. If the pain is still present after adjustments, discontinue the exercise until you can discuss the trouble with your caregiver. STRENGTHENING - Deep Abdominals, Pelvic Tilt   Lie on a firm bed or floor. Keeping your legs in front of you, bend your knees so they are both pointed toward the ceiling and your feet are flat on the floor.  Tense your lower abdominal muscles to press your low back into the floor. This motion will rotate your pelvis so that your tail bone is scooping upwards rather than pointing at your feet or into the floor. With a gentle tension and even breathing, hold this position for __________ seconds. Repeat __________ times. Complete this exercise __________ times per day.  STRENGTHENING - Abdominals, Crunches   Lie on a firm bed or floor. Keeping your legs in front of you, bend your knees so they are both pointed toward the ceiling and your feet are flat on the floor. Cross your arms over your chest.  Slightly tip your chin down without bending your neck.  Tense your abdominals and slowly lift your trunk high enough to just clear your shoulder blades. Lifting higher can put excessive stress on the lower back and does not further strengthen your abdominal muscles.  Control your return to  the starting position. Repeat __________ times. Complete this exercise __________ times per day.  STRENGTHENING - Quadruped, Opposite UE/LE Lift   Assume a hands and knees position on a firm surface. Keep your hands under your shoulders and your knees under your hips. You may place padding under your knees for comfort.  Find your neutral spine and gently tense your abdominal muscles so that you can maintain this position. Your shoulders and hips should form a rectangle that is parallel with the floor and is not twisted.  Keeping your trunk steady, lift your right hand no higher than your shoulder and then your left leg no higher than your hip. Make sure you are not  holding your breath. Hold this position for __________ seconds.  Continuing to keep your abdominal muscles tense and your back steady, slowly return to your starting position. Repeat with the opposite arm and leg. Repeat __________ times. Complete this exercise __________ times per day.  STRENGTHENING - Abdominals and Quadriceps, Straight Leg Raise   Lie on a firm bed or floor with both legs extended in front of you.  Keeping one leg in contact with the floor, bend the other knee so that your foot can rest flat on the floor.  Find your neutral spine, and tense your abdominal muscles to maintain your spinal position throughout the exercise.  Slowly lift your straight leg off the floor about 6 inches for a count of 15, making sure to not hold your breath.  Still keeping your neutral spine, slowly lower your leg all the way to the floor. Repeat this exercise with each leg __________ times. Complete this exercise __________ times per day. POSTURE AND BODY MECHANICS CONSIDERATIONS - Low Back Sprain Keeping correct posture when sitting, standing or completing your activities will reduce the stress put on different body tissues, allowing injured tissues a chance to heal and limiting painful experiences. The following are general  guidelines for improved posture. Your physician or physical therapist will provide you with any instructions specific to your needs. While reading these guidelines, remember:  The exercises prescribed by your provider will help you have the flexibility and strength to maintain correct postures.  The correct posture provides the best environment for your joints to work. All of your joints have less wear and tear when properly supported by a spine with good posture. This means you will experience a healthier, less painful body.  Correct posture must be practiced with all of your activities, especially prolonged sitting and standing. Correct posture is as important when doing repetitive low-stress activities (typing) as it is when doing a single heavy-load activity (lifting). RESTING POSITIONS Consider which positions are most painful for you when choosing a resting position. If you have pain with flexion-based activities (sitting, bending, stooping, squatting), choose a position that allows you to rest in a less flexed posture. You would want to avoid curling into a fetal position on your side. If your pain worsens with extension-based activities (prolonged standing, working overhead), avoid resting in an extended position such as sleeping on your stomach. Most people will find more comfort when they rest with their spine in a more neutral position, neither too rounded nor too arched. Lying on a non-sagging bed on your side with a pillow between your knees, or on your back with a pillow under your knees will often provide some relief. Keep in mind, being in any one position for a prolonged period of time, no matter how correct your posture, can still lead to stiffness. PROPER SITTING POSTURE In order to minimize stress and discomfort on your spine, you must sit with correct posture. Sitting with good posture should be effortless for a healthy body. Returning to good posture is a gradual process. Many people  can work toward this most comfortably by using various supports until they have the flexibility and strength to maintain this posture on their own. When sitting with proper posture, your ears will fall over your shoulders and your shoulders will fall over your hips. You should use the back of the chair to support your upper back. Your lower back will be in a neutral position, just slightly arched. You may place a small pillow or  folded towel at the base of your lower back for  support.  When working at a desk, create an environment that supports good, upright posture. Without extra support, muscles tire, which leads to excessive strain on joints and other tissues. Keep these recommendations in mind: CHAIR:  A chair should be able to slide under your desk when your back makes contact with the back of the chair. This allows you to work closely.  The chair's height should allow your eyes to be level with the upper part of your monitor and your hands to be slightly lower than your elbows. BODY POSITION  Your feet should make contact with the floor. If this is not possible, use a foot rest.  Keep your ears over your shoulders. This will reduce stress on your neck and low back. INCORRECT SITTING POSTURES  If you are feeling tired and unable to assume a healthy sitting posture, do not slouch or slump. This puts excessive strain on your back tissues, causing more damage and pain. Healthier options include:  Using more support, like a lumbar pillow.  Switching tasks to something that requires you to be upright or walking.  Talking a brief walk.  Lying down to rest in a neutral-spine position. PROLONGED STANDING WHILE SLIGHTLY LEANING FORWARD  When completing a task that requires you to lean forward while standing in one place for a long time, place either foot up on a stationary 2-4 inch high object to help maintain the best posture. When both feet are on the ground, the lower back tends to lose its  slight inward curve. If this curve flattens (or becomes too large), then the back and your other joints will experience too much stress, tire more quickly, and can cause pain. CORRECT STANDING POSTURES Proper standing posture should be assumed with all daily activities, even if they only take a few moments, like when brushing your teeth. As in sitting, your ears should fall over your shoulders and your shoulders should fall over your hips. You should keep a slight tension in your abdominal muscles to brace your spine. Your tailbone should point down to the ground, not behind your body, resulting in an over-extended swayback posture.  INCORRECT STANDING POSTURES  Common incorrect standing postures include a forward head, locked knees and/or an excessive swayback. WALKING Walk with an upright posture. Your ears, shoulders and hips should all line-up. PROLONGED ACTIVITY IN A FLEXED POSITION When completing a task that requires you to bend forward at your waist or lean over a low surface, try to find a way to stabilize 3 out of 4 of your limbs. You can place a hand or elbow on your thigh or rest a knee on the surface you are reaching across. This will provide you more stability, so that your muscles do not tire as quickly. By keeping your knees relaxed, or slightly bent, you will also reduce stress across your lower back. CORRECT LIFTING TECHNIQUES DO :  Assume a wide stance. This will provide you more stability and the opportunity to get as close as possible to the object which you are lifting.  Tense your abdominals to brace your spine. Bend at the knees and hips. Keeping your back locked in a neutral-spine position, lift using your leg muscles. Lift with your legs, keeping your back straight.  Test the weight of unknown objects before attempting to lift them.  Try to keep your elbows locked down at your sides in order get the best strength from your  shoulders when carrying an object.  Always ask  for help when lifting heavy or awkward objects. INCORRECT LIFTING TECHNIQUES DO NOT:   Lock your knees when lifting, even if it is a small object.  Bend and twist. Pivot at your feet or move your feet when needing to change directions.  Assume that you can safely pick up even a paperclip without proper posture.   This information is not intended to replace advice given to you by your health care provider. Make sure you discuss any questions you have with your health care provider.   Document Released: 04/05/2005 Document Revised: 04/26/2014 Document Reviewed: 07/18/2008 Elsevier Interactive Patient Education 2016 [place] for Gastroesophageal Reflux Disease, Adult When you have gastroesophageal reflux disease (GERD), the foods you eat and your eating habits are very important. Choosing the right foods can help ease the discomfort of GERD. WHAT GENERAL GUIDELINES DO I NEED TO FOLLOW?  Choose fruits, vegetables, whole grains, low-fat dairy products, and low-fat meat, fish, and poultry.  Limit fats such as oils, salad dressings, butter, nuts, and avocado.  Keep a food diary to identify foods that cause symptoms.  Avoid foods that cause reflux. These may be different for different people.  Eat frequent small meals instead of three large meals each day.  Eat your meals slowly, in a relaxed setting.  Limit fried foods.  Cook foods using methods other than frying.  Avoid drinking alcohol.  Avoid drinking large amounts of liquids with your meals.  Avoid bending over or lying down until 2-3 hours after eating. WHAT FOODS ARE NOT RECOMMENDED? The following are some foods and drinks that may worsen your symptoms: Vegetables Tomatoes. Tomato juice. Tomato and spaghetti sauce. Chili peppers. Onion and garlic. Horseradish. Fruits Oranges, grapefruit, and lemon (fruit and juice). Meats High-fat meats, fish, and poultry. This includes hot dogs, ribs, ham, sausage,  salami, and bacon. Dairy Whole milk and chocolate milk. Sour cream. Cream. Butter. Ice cream. Cream cheese.  Beverages Coffee and tea, with or without caffeine. Carbonated beverages or energy drinks. Condiments Hot sauce. Barbecue sauce.  Sweets/Desserts Chocolate and cocoa. Donuts. Peppermint and spearmint. Fats and Oils High-fat foods, including [place] fries and potato chips. Other Vinegar. Strong spices, such as black pepper, white pepper, red pepper, cayenne, curry powder, cloves, ginger, and chili powder. The items listed above may not be a complete list of foods and beverages to avoid. Contact your dietitian for more information.   This information is not intended to replace advice given to you by your health care provider. Make sure you discuss any questions you have with your health care provider.   Document Released: 04/05/2005 Document Revised: 04/26/2014 Document Reviewed: 02/07/2013 Elsevier Interactive Patient Education [employer].

## 2015-12-08 NOTE — Assessment & Plan Note (Signed)
Symptoms or GERD appears stable with current regimen and no adverse side effects. Food choices to reduce GERD symptoms provided and after visit summary. Continue current dosage of omeprazole.

## 2015-12-10 ENCOUNTER — Encounter: Payer: Self-pay | Admitting: Family

## 2015-12-10 LAB — URINE CULTURE: ORGANISM ID, BACTERIA: NO GROWTH

## 2015-12-29 ENCOUNTER — Encounter: Payer: Self-pay | Admitting: Physician Assistant

## 2015-12-29 ENCOUNTER — Ambulatory Visit (INDEPENDENT_AMBULATORY_CARE_PROVIDER_SITE_OTHER): Payer: Self-pay | Admitting: Physician Assistant

## 2015-12-29 ENCOUNTER — Ambulatory Visit (HOSPITAL_BASED_OUTPATIENT_CLINIC_OR_DEPARTMENT_OTHER)
Admission: RE | Admit: 2015-12-29 | Discharge: 2015-12-29 | Disposition: A | Discharge status: Home / Self Care | Payer: Self-pay | Source: Ambulatory Visit | Attending: Physician Assistant | Admitting: Physician Assistant

## 2015-12-29 VITALS — BP 136/86 | HR 68 | Temp 97.8°F | Resp 16 | Ht 62.5 in | Wt 196.0 lb

## 2015-12-29 DIAGNOSIS — M47896 Other spondylosis, lumbar region: Secondary | ICD-10-CM | POA: Insufficient documentation

## 2015-12-29 DIAGNOSIS — M5441 Lumbago with sciatica, right side: Secondary | ICD-10-CM | POA: Insufficient documentation

## 2015-12-29 DIAGNOSIS — Q7649 Other congenital malformations of spine, not associated with scoliosis: Secondary | ICD-10-CM | POA: Insufficient documentation

## 2015-12-29 MED ORDER — TRAMADOL HCL 50 MG PO TABS: 50.0000 mg | ORAL_TABLET | Freq: Two times a day (BID) | ORAL | 0 refills | Status: DC | PRN

## 2015-12-29 MED ORDER — METHYLPREDNISOLONE 4 MG PO TBPK: ORAL_TABLET | ORAL | 0 refills | Status: DC

## 2015-12-29 NOTE — Patient Instructions (Signed)
Please go downstairs for imaging. I will call you with your results. Please start the Medrol pack as directed. Tramadol in the evening for pain. Do not take with Pamelor. During the day you can take Extra strength tylenol. Avoid heavy lifting.  Follow-up if symptoms are not resolving.   Sciatica With Rehab The sciatic nerve runs from the back down the leg and is responsible for sensation and control of the muscles in the back (posterior) side of the thigh, lower leg, and foot. Sciatica is a condition that is characterized by inflammation of this nerve.  SYMPTOMS   Signs of nerve damage, including numbness and/or weakness along the posterior side of the lower extremity.  Pain in the back of the thigh that may also travel down the leg.  Pain that worsens when sitting for long periods of time.  Occasionally, pain in the back or buttock. CAUSES  Inflammation of the sciatic nerve is the cause of sciatica. The inflammation is due to something irritating the nerve. Common sources of irritation include:  Sitting for long periods of time.  Direct trauma to the nerve.  Arthritis of the spine.  Herniated or ruptured disk.  Slipping of the vertebrae (spondylolisthesis).  Pressure from soft tissues, such as muscles or ligament-like tissue (fascia). RISK INCREASES WITH:  Sports that place pressure or stress on the spine (football or weightlifting).  Poor strength and flexibility.  Failure to warm up properly before activity.  Family history of low back pain or disk disorders.  Previous back injury or surgery.  Poor body mechanics, especially when lifting, or poor posture. PREVENTION   Warm up and stretch properly before activity.  Maintain physical fitness:  Strength, flexibility, and endurance.  Cardiovascular fitness.  Learn and use proper technique, especially with posture and lifting. When possible, have coach correct improper technique.  Avoid activities that place  stress on the spine. PROGNOSIS If treated properly, then sciatica usually resolves within 6 weeks. However, occasionally surgery is necessary.  RELATED COMPLICATIONS   Permanent nerve damage, including pain, numbness, tingle, or weakness.  Chronic back pain.  Risks of surgery: infection, bleeding, nerve damage, or damage to surrounding tissues. TREATMENT Treatment initially involves resting from any activities that aggravate your symptoms. The use of ice and medication may help reduce pain and inflammation. The use of strengthening and stretching exercises may help reduce pain with activity. These exercises may be performed at home or with referral to a therapist. A therapist may recommend further treatments, such as transcutaneous electronic nerve stimulation (TENS) or ultrasound. Your caregiver may recommend corticosteroid injections to help reduce inflammation of the sciatic nerve. If symptoms persist despite non-surgical (conservative) treatment, then surgery may be recommended. MEDICATION  If pain medication is necessary, then nonsteroidal anti-inflammatory medications, such as aspirin and ibuprofen, or other minor pain relievers, such as acetaminophen, are often recommended.  Do not take pain medication for 7 days before surgery.  Prescription pain relievers may be given if deemed necessary by your caregiver. Use only as directed and only as much as you need.  Ointments applied to the skin may be helpful.  Corticosteroid injections may be given by your caregiver. These injections should be reserved for the most serious cases, because they may only be given a certain number of times. HEAT AND COLD  Cold treatment (icing) relieves pain and reduces inflammation. Cold treatment should be applied for 10 to 15 minutes every 2 to 3 hours for inflammation and pain and immediately after any activity that  aggravates your symptoms. Use ice packs or massage the area with a piece of ice (ice  massage).  Heat treatment may be used prior to performing the stretching and strengthening activities prescribed by your caregiver, physical therapist, or athletic trainer. Use a heat pack or soak the injury in warm water. SEEK MEDICAL CARE IF:  Treatment seems to offer no benefit, or the condition worsens.  Any medications produce adverse side effects. EXERCISES  RANGE OF MOTION (ROM) AND STRETCHING EXERCISES - Sciatica Most people with sciatic will find that their symptoms worsen with either excessive bending forward (flexion) or arching at the low back (extension). The exercises which will help resolve your symptoms will focus on the opposite motion. Your physician, physical therapist or athletic trainer will help you determine which exercises will be most helpful to resolve your low back pain. Do not complete any exercises without first consulting with your clinician. Discontinue any exercises which worsen your symptoms until you speak to your clinician. If you have pain, numbness or tingling which travels down into your buttocks, leg or foot, the goal of the therapy is for these symptoms to move closer to your back and eventually resolve. Occasionally, these leg symptoms will get better, but your low back pain may worsen; this is typically an indication of progress in your rehabilitation. Be certain to be very alert to any changes in your symptoms and the activities in which you participated in the 24 hours prior to the change. Sharing this information with your clinician will allow him/her to most efficiently treat your condition. These exercises may help you when beginning to rehabilitate your injury. Your symptoms may resolve with or without further involvement from your physician, physical therapist or athletic trainer. While completing these exercises, remember:   Restoring tissue flexibility helps normal motion to return to the joints. This allows healthier, less painful movement and  activity.  An effective stretch should be held for at least 30 seconds.  A stretch should never be painful. You should only feel a gentle lengthening or release in the stretched tissue. FLEXION RANGE OF MOTION AND STRETCHING EXERCISES: STRETCH - Flexion, Single Knee to Chest   Lie on a firm bed or floor with both legs extended in front of you.  Keeping one leg in contact with the floor, bring your opposite knee to your chest. Hold your leg in place by either grabbing behind your thigh or at your knee.  Pull until you feel a gentle stretch in your low back. Hold __________ seconds.  Slowly release your grasp and repeat the exercise with the opposite side. Repeat __________ times. Complete this exercise __________ times per day.  STRETCH - Flexion, Double Knee to Chest  Lie on a firm bed or floor with both legs extended in front of you.  Keeping one leg in contact with the floor, bring your opposite knee to your chest.  Tense your stomach muscles to support your back and then lift your other knee to your chest. Hold your legs in place by either grabbing behind your thighs or at your knees.  Pull both knees toward your chest until you feel a gentle stretch in your low back. Hold __________ seconds.  Tense your stomach muscles and slowly return one leg at a time to the floor. Repeat __________ times. Complete this exercise __________ times per day.  STRETCH - Low Trunk Rotation   Lie on a firm bed or floor. Keeping your legs in front of you, bend your knees  so they are both pointed toward the ceiling and your feet are flat on the floor.  Extend your arms out to the side. This will stabilize your upper body by keeping your shoulders in contact with the floor.  Gently and slowly drop both knees together to one side until you feel a gentle stretch in your low back. Hold for __________ seconds.  Tense your stomach muscles to support your low back as you bring your knees back to the  starting position. Repeat the exercise to the other side. Repeat __________ times. Complete this exercise __________ times per day  EXTENSION RANGE OF MOTION AND FLEXIBILITY EXERCISES: STRETCH - Extension, Prone on Elbows  Lie on your stomach on the floor, a bed will be too soft. Place your palms about shoulder width apart and at the height of your head.  Place your elbows under your shoulders. If this is too painful, stack pillows under your chest.  Allow your body to relax so that your hips drop lower and make contact more completely with the floor.  Hold this position for __________ seconds.  Slowly return to lying flat on the floor. Repeat __________ times. Complete this exercise __________ times per day.  RANGE OF MOTION - Extension, Prone Press Ups  Lie on your stomach on the floor, a bed will be too soft. Place your palms about shoulder width apart and at the height of your head.  Keeping your back as relaxed as possible, slowly straighten your elbows while keeping your hips on the floor. You may adjust the placement of your hands to maximize your comfort. As you gain motion, your hands will come more underneath your shoulders.  Hold this position __________ seconds.  Slowly return to lying flat on the floor. Repeat __________ times. Complete this exercise __________ times per day.  STRENGTHENING EXERCISES - Sciatica  These exercises may help you when beginning to rehabilitate your injury. These exercises should be done near your "sweet spot." This is the neutral, low-back arch, somewhere between fully rounded and fully arched, that is your least painful position. When performed in this safe range of motion, these exercises can be used for people who have either a flexion or extension based injury. These exercises may resolve your symptoms with or without further involvement from your physician, physical therapist or athletic trainer. While completing these exercises, remember:    Muscles can gain both the endurance and the strength needed for everyday activities through controlled exercises.  Complete these exercises as instructed by your physician, physical therapist or athletic trainer. Progress with the resistance and repetition exercises only as your caregiver advises.  You may experience muscle soreness or fatigue, but the pain or discomfort you are trying to eliminate should never worsen during these exercises. If this pain does worsen, stop and make certain you are following the directions exactly. If the pain is still present after adjustments, discontinue the exercise until you can discuss the trouble with your clinician. STRENGTHENING - Deep Abdominals, Pelvic Tilt   Lie on a firm bed or floor. Keeping your legs in front of you, bend your knees so they are both pointed toward the ceiling and your feet are flat on the floor.  Tense your lower abdominal muscles to press your low back into the floor. This motion will rotate your pelvis so that your tail bone is scooping upwards rather than pointing at your feet or into the floor.  With a gentle tension and even breathing, hold this position for  __________ seconds. Repeat __________ times. Complete this exercise __________ times per day.  STRENGTHENING - Abdominals, Crunches   Lie on a firm bed or floor. Keeping your legs in front of you, bend your knees so they are both pointed toward the ceiling and your feet are flat on the floor. Cross your arms over your chest.  Slightly tip your chin down without bending your neck.  Tense your abdominals and slowly lift your trunk high enough to just clear your shoulder blades. Lifting higher can put excessive stress on the low back and does not further strengthen your abdominal muscles.  Control your return to the starting position. Repeat __________ times. Complete this exercise __________ times per day.  STRENGTHENING - Quadruped, Opposite UE/LE Lift  Assume a  hands and knees position on a firm surface. Keep your hands under your shoulders and your knees under your hips. You may place padding under your knees for comfort.  Find your neutral spine and gently tense your abdominal muscles so that you can maintain this position. Your shoulders and hips should form a rectangle that is parallel with the floor and is not twisted.  Keeping your trunk steady, lift your right hand no higher than your shoulder and then your left leg no higher than your hip. Make sure you are not holding your breath. Hold this position __________ seconds.  Continuing to keep your abdominal muscles tense and your back steady, slowly return to your starting position. Repeat with the opposite arm and leg. Repeat __________ times. Complete this exercise __________ times per day.  STRENGTHENING - Abdominals and Quadriceps, Straight Leg Raise   Lie on a firm bed or floor with both legs extended in front of you.  Keeping one leg in contact with the floor, bend the other knee so that your foot can rest flat on the floor.  Find your neutral spine, and tense your abdominal muscles to maintain your spinal position throughout the exercise.  Slowly lift your straight leg off the floor about 6 inches for a count of 15, making sure to not hold your breath.  Still keeping your neutral spine, slowly lower your leg all the way to the floor. Repeat this exercise with each leg __________ times. Complete this exercise __________ times per day. POSTURE AND BODY MECHANICS CONSIDERATIONS - Sciatica Keeping correct posture when sitting, standing or completing your activities will reduce the stress put on different body tissues, allowing injured tissues a chance to heal and limiting painful experiences. The following are general guidelines for improved posture. Your physician or physical therapist will provide you with any instructions specific to your needs. While reading these guidelines,  remember:  The exercises prescribed by your provider will help you have the flexibility and strength to maintain correct postures.  The correct posture provides the optimal environment for your joints to work. All of your joints have less wear and tear when properly supported by a spine with good posture. This means you will experience a healthier, less painful body.  Correct posture must be practiced with all of your activities, especially prolonged sitting and standing. Correct posture is as important when doing repetitive low-stress activities (typing) as it is when doing a single heavy-load activity (lifting). RESTING POSITIONS Consider which positions are most painful for you when choosing a resting position. If you have pain with flexion-based activities (sitting, bending, stooping, squatting), choose a position that allows you to rest in a less flexed posture. You would want to avoid curling into  a fetal position on your side. If your pain worsens with extension-based activities (prolonged standing, working overhead), avoid resting in an extended position such as sleeping on your stomach. Most people will find more comfort when they rest with their spine in a more neutral position, neither too rounded nor too arched. Lying on a non-sagging bed on your side with a pillow between your knees, or on your back with a pillow under your knees will often provide some relief. Keep in mind, being in any one position for a prolonged period of time, no matter how correct your posture, can still lead to stiffness. PROPER SITTING POSTURE In order to minimize stress and discomfort on your spine, you must sit with correct posture Sitting with good posture should be effortless for a healthy body. Returning to good posture is a gradual process. Many people can work toward this most comfortably by using various supports until they have the flexibility and strength to maintain this posture on their own. When sitting  with proper posture, your ears will fall over your shoulders and your shoulders will fall over your hips. You should use the back of the chair to support your upper back. Your low back will be in a neutral position, just slightly arched. You may place a small pillow or folded towel at the base of your low back for support.  When working at a desk, create an environment that supports good, upright posture. Without extra support, muscles fatigue and lead to excessive strain on joints and other tissues. Keep these recommendations in mind: CHAIR:   A chair should be able to slide under your desk when your back makes contact with the back of the chair. This allows you to work closely.  The chair's height should allow your eyes to be level with the upper part of your monitor and your hands to be slightly lower than your elbows. BODY POSITION  Your feet should make contact with the floor. If this is not possible, use a foot rest.  Keep your ears over your shoulders. This will reduce stress on your neck and low back. INCORRECT SITTING POSTURES   If you are feeling tired and unable to assume a healthy sitting posture, do not slouch or slump. This puts excessive strain on your back tissues, causing more damage and pain. Healthier options include:  Using more support, like a lumbar pillow.  Switching tasks to something that requires you to be upright or walking.  Talking a brief walk.  Lying down to rest in a neutral-spine position. PROLONGED STANDING WHILE SLIGHTLY LEANING FORWARD  When completing a task that requires you to lean forward while standing in one place for a long time, place either foot up on a stationary 2-4 inch high object to help maintain the best posture. When both feet are on the ground, the low back tends to lose its slight inward curve. If this curve flattens (or becomes too large), then the back and your other joints will experience too much stress, fatigue more quickly and can  cause pain.  CORRECT STANDING POSTURES Proper standing posture should be assumed with all daily activities, even if they only take a few moments, like when brushing your teeth. As in sitting, your ears should fall over your shoulders and your shoulders should fall over your hips. You should keep a slight tension in your abdominal muscles to brace your spine. Your tailbone should point down to the ground, not behind your body, resulting in an over-extended  swayback posture.  INCORRECT STANDING POSTURES  Common incorrect standing postures include a forward head, locked knees and/or an excessive swayback. WALKING Walk with an upright posture. Your ears, shoulders and hips should all line-up. PROLONGED ACTIVITY IN A FLEXED POSITION When completing a task that requires you to bend forward at your waist or lean over a low surface, try to find a way to stabilize 3 of 4 of your limbs. You can place a hand or elbow on your thigh or rest a knee on the surface you are reaching across. This will provide you more stability so that your muscles do not fatigue as quickly. By keeping your knees relaxed, or slightly bent, you will also reduce stress across your low back. CORRECT LIFTING TECHNIQUES DO :   Assume a wide stance. This will provide you more stability and the opportunity to get as close as possible to the object which you are lifting.  Tense your abdominals to brace your spine; then bend at the knees and hips. Keeping your back locked in a neutral-spine position, lift using your leg muscles. Lift with your legs, keeping your back straight.  Test the weight of unknown objects before attempting to lift them.  Try to keep your elbows locked down at your sides in order get the best strength from your shoulders when carrying an object.  Always ask for help when lifting heavy or awkward objects. INCORRECT LIFTING TECHNIQUES DO NOT:   Lock your knees when lifting, even if it is a small object.  Bend and  twist. Pivot at your feet or move your feet when needing to change directions.  Assume that you cannot safely pick up a paperclip without proper posture.   This information is not intended to replace advice given to you by your health care provider. Make sure you discuss any questions you have with your health care provider.   Document Released: 04/05/2005 Document Revised: 08/20/2014 Document Reviewed: 07/18/2008 [employer] Interactive Patient Education [employer].
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
## 2015-12-29 NOTE — Progress Notes (Signed)
Pre visit review using our clinic review tool, if applicable. No additional management support is needed unless otherwise documented below in the visit note/SLS  

## 2016-01-08 ENCOUNTER — Encounter: Payer: Self-pay | Admitting: Adult Health

## 2016-01-08 ENCOUNTER — Ambulatory Visit (INDEPENDENT_AMBULATORY_CARE_PROVIDER_SITE_OTHER): Payer: Self-pay | Admitting: Adult Health

## 2016-01-08 VITALS — BP 162/90 | Temp 98.2°F | Ht 62.5 in | Wt 197.6 lb

## 2016-01-08 DIAGNOSIS — R002 Palpitations: Secondary | ICD-10-CM

## 2016-01-08 DIAGNOSIS — R5383 Other fatigue: Secondary | ICD-10-CM

## 2016-01-08 NOTE — Patient Instructions (Signed)
Your EKG is normal.   I will follow up with you regarding your blood work.   Start exercising again, get out side and go for a walk.   Follow up with Marya Amsler next week.

## 2016-01-08 NOTE — Progress Notes (Signed)
Subjective:    Patient ID: Katrina [name], female    DOB: October 25, 1955, 60 y.o.   MRN: [id number]  HPI   60 year old female who  has a past medical history of Chest pain at rest (11/07/2012); Eosinophilic esophagitis (0000000); GERD (gastroesophageal reflux disease); Hypothyroidism; Sarcoidosis ([place]); Shortness of breath (11/08/2012); and Thyroid disease. Presents to the office today for palpitations and fatigue.   She reports that she was treated with prednisone 10 days ago for  back pain. During the time she was taking prednisone she was not able to sleep and was experiencing palpitations. She reports that she was able to finish her medrol dose pack and has been done with that for about 5 days. Since finishing the prednisone she has been more fatigued and has been waking up from a sound sleep with palpitations. She went to the pharmacy today and reports that the pharmacist told her that it may be her thyroid or that the prednisone may still be in her system.   She is also complaining of " my eyes feeling weird, like they are bulging. Sometimes when this happens my thyroid level is out of wack."    Review of Systems  Constitutional: Positive for fatigue.  HENT: Negative.   Eyes: Positive for visual disturbance. Negative for photophobia, pain, discharge, redness and itching.  Respiratory: Negative.   Cardiovascular: Positive for palpitations. Negative for chest pain and leg swelling.  Genitourinary: Negative.   Musculoskeletal: Positive for back pain.  Skin: Negative.   Neurological: Negative.   Psychiatric/Behavioral: Positive for sleep disturbance. The patient is nervous/anxious.   All other systems reviewed and are negative.      Objective:   Physical Exam  Constitutional: She is oriented to person, place, and time. She appears well-developed and well-nourished. No distress.  HENT:  Head: Normocephalic and atraumatic.  Right Ear: External ear normal.  Left Ear: External ear  normal.  Nose: Nose normal.  Mouth/Throat: Oropharynx is clear and moist. No oropharyngeal exudate.  Eyes: Conjunctivae and EOM are normal. Pupils are equal, round, and reactive to light. Right eye exhibits no discharge. Left eye exhibits no discharge. Right conjunctiva is not injected. Left conjunctiva is not injected. Left conjunctiva has no hemorrhage. Right eye exhibits normal extraocular motion and no nystagmus. Left eye exhibits normal extraocular motion and no nystagmus.  Neck: Normal range of motion. Neck supple. No thyromegaly present.  Cardiovascular: Normal rate, regular rhythm, normal heart sounds and intact distal pulses.  Exam reveals no gallop and no friction rub.   No murmur heard. Pulmonary/Chest: Effort normal and breath sounds normal. No respiratory distress. She has no wheezes. She has no rales. She exhibits no tenderness.  Musculoskeletal: Normal range of motion. She exhibits no edema, tenderness or deformity.  Neurological: She is alert and oriented to person, place, and time. She has normal reflexes. She displays normal reflexes. No cranial nerve deficit. She exhibits normal muscle tone. Coordination normal.  Skin: Skin is warm and dry. No rash noted. She is not diaphoretic. No erythema. No pallor.  Psychiatric: She has a normal mood and affect. Her behavior is normal. Judgment and thought content normal.  She appears anxious   Nursing note and vitals reviewed.     Assessment & Plan:  1. Palpitations - Possibly anxiety related advised to go outside this evening and start walking. Getting back into an exercise routine will help  - EKG 12-Lead - NSR, rate 78  - CBC with Differential/Platelet - Basic metabolic  panel - TSH - will follow up with patient on labs. She is to follow up with PCP next week  - Consider cardiac monitor  2. Other fatigue - Doubt thyroid, her last TSH was normal and was drawn 3 months ago.  - EKG 12-Lead - CBC with Differential/Platelet - Basic  metabolic panel - TSH   Dorothyann Peng, NP

## 2016-01-09 ENCOUNTER — Other Ambulatory Visit: Payer: Self-pay | Admitting: Adult Health

## 2016-01-09 LAB — CBC WITH DIFFERENTIAL/PLATELET
BASOS ABS: 0 10*3/uL (ref 0.0–0.1)
BASOS PCT: 0.3 % (ref 0.0–3.0)
EOS PCT: 1.8 % (ref 0.0–5.0)
Eosinophils Absolute: 0.2 10*3/uL (ref 0.0–0.7)
HEMATOCRIT: 42.8 % (ref 36.0–46.0)
Hemoglobin: 14.7 g/dL (ref 12.0–15.0)
LYMPHS PCT: 19 % (ref 12.0–46.0)
Lymphs Abs: 2.1 10*3/uL (ref 0.7–4.0)
MCHC: 34.4 g/dL (ref 30.0–36.0)
MCV: 82 fl (ref 78.0–100.0)
MONOS PCT: 6.8 % (ref 3.0–12.0)
Monocytes Absolute: 0.8 10*3/uL (ref 0.1–1.0)
NEUTROS ABS: 8 10*3/uL — AB (ref 1.4–7.7)
Neutrophils Relative %: 72.1 % (ref 43.0–77.0)
PLATELETS: 250 10*3/uL (ref 150.0–400.0)
RBC: 5.23 Mil/uL — ABNORMAL HIGH (ref 3.87–5.11)
RDW: 14.5 % (ref 11.5–15.5)
WBC: 11.1 10*3/uL — ABNORMAL HIGH (ref 4.0–10.5)

## 2016-01-09 LAB — BASIC METABOLIC PANEL
BUN: 15 mg/dL (ref 6–23)
CALCIUM: 9.1 mg/dL (ref 8.4–10.5)
CHLORIDE: 102 meq/L (ref 96–112)
CO2: 27 mEq/L (ref 19–32)
CREATININE: 0.84 mg/dL (ref 0.40–1.20)
GFR: 73.06 mL/min (ref >60.00)
Glucose, Bld: 104 mg/dL — ABNORMAL HIGH (ref 70–99)
Potassium: 4.2 mEq/L (ref 3.5–5.1)
Sodium: 139 mEq/L (ref 135–145)

## 2016-01-09 LAB — TSH: TSH: 10.92 u[IU]/mL — AB (ref 0.35–4.50)

## 2016-01-09 MED ORDER — LEVOTHYROXINE SODIUM 137 MCG PO TABS: 125.0000 ug | ORAL_TABLET | Freq: Every day | ORAL | 0 refills | Status: DC

## 2016-01-28 ENCOUNTER — Ambulatory Visit (INDEPENDENT_AMBULATORY_CARE_PROVIDER_SITE_OTHER): Payer: Self-pay | Admitting: Family Medicine

## 2016-01-28 ENCOUNTER — Encounter: Payer: Self-pay | Admitting: Family Medicine

## 2016-01-28 DIAGNOSIS — M7601 Gluteal tendinitis, right hip: Secondary | ICD-10-CM | POA: Insufficient documentation

## 2016-01-28 NOTE — Assessment & Plan Note (Signed)
Patient does have what appears to be more of a gluteal tendinitis. We discussed with patient at great length. Patient also try more of a natural approach and we discussed over-the-counter medications. Work with Product/process development scientist to learn home exercises in greater detail. We discussed some of her comorbidities including sarcoidosis as well as hypothyroidism can be plain a role. We discussed his diet and weight loss. Patient will try to make these changes and come back and see me again in 4 weeks.
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
## 2016-01-28 NOTE — Patient Instructions (Signed)
Good to see you.  Ice 20 minutes 2 times daily. Usually after activity and before bed. Exercises 3 times a week.  pennsaid pinkie amount topically 2 times daily as needed.  Vitamin D 1000 IU daily  Turmeric 500mg  twice daily  This will get better but may be slow.  See me again in 4 weeks if not better.

## 2016-02-02 ENCOUNTER — Ambulatory Visit: Payer: Self-pay | Admitting: Internal Medicine
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
## 2016-02-19 ENCOUNTER — Other Ambulatory Visit (INDEPENDENT_AMBULATORY_CARE_PROVIDER_SITE_OTHER): Payer: Self-pay

## 2016-02-19 ENCOUNTER — Other Ambulatory Visit: Payer: Self-pay | Admitting: Family

## 2016-02-19 ENCOUNTER — Ambulatory Visit: Payer: Self-pay | Admitting: Family Medicine

## 2016-02-19 ENCOUNTER — Encounter: Payer: Self-pay | Admitting: Family

## 2016-02-19 DIAGNOSIS — R946 Abnormal results of thyroid function studies: Secondary | ICD-10-CM

## 2016-02-19 DIAGNOSIS — R7989 Other specified abnormal findings of blood chemistry: Secondary | ICD-10-CM

## 2016-02-19 LAB — TSH: TSH: 1.61 u[IU]/mL (ref 0.35–4.50)

## 2016-02-19 LAB — T4, FREE: FREE T4: 0.92 ng/dL (ref 0.60–1.60)

## 2016-03-01 ENCOUNTER — Encounter: Payer: Self-pay | Admitting: Family Medicine

## 2016-03-01 ENCOUNTER — Ambulatory Visit (INDEPENDENT_AMBULATORY_CARE_PROVIDER_SITE_OTHER): Payer: Self-pay | Admitting: Family Medicine

## 2016-03-01 DIAGNOSIS — M545 Low back pain, unspecified: Secondary | ICD-10-CM | POA: Insufficient documentation

## 2016-03-01 MED ORDER — GABAPENTIN 100 MG PO CAPS: 200.0000 mg | ORAL_CAPSULE | Freq: Every day | ORAL | 3 refills | Status: DC

## 2016-03-01 MED ORDER — PREDNISONE 50 MG PO TABS: 50.0000 mg | ORAL_TABLET | Freq: Every day | ORAL | 0 refills | Status: DC

## 2016-03-01 NOTE — Addendum Note (Signed)
Addended by: Douglass Rivers T on: 03/01/2016 09:37 AM   Modules accepted: Orders

## 2016-03-01 NOTE — Assessment & Plan Note (Signed)
Patient is having worsening symptoms patient has a history of sarcoidosis. Patient will be started on prednisone again at this time. Started on gabapentin low dose. Patient is concern for any type of difficulty and recently had a cancer in the family that makes her concern as well. Patient like an MRI. The pain is waking her up at night. Patient will have this done him back after the imaging. At that time if worsening symptoms we will need to consider possible epidural injections. Patient would be willing to do that.

## 2016-03-01 NOTE — Patient Instructions (Addendum)
Good to see you  Prednisone daily for 5 days  Gabapentin 200mg  at night Ice is your friend  Katrina [name] physical therapy will be calling you We will get you MRI as well.  See me again in 1-2 days after [name] and we will discuss.

## 2016-03-01 NOTE — Progress Notes (Signed)
[facility] [address] [place], [place] [address] Phone: [phone] Subjective:    I'm seeing this patient by the request  of:  Mauricio Po, FNP  CC: hip pain Follow-up  [id number]  Katrina [name] is a 60 y.o. female coming in with complaint of hip pain.   Patient was given a Medrol Dosepak. Patient also had x-rays. X-rays of patient's lumbar spine were independently visualized by me. X-rays  showed patient did have degenerative disc disease at L4-L5 and mild facet arthropathy throughout.   Patient was seen by me one month ago and was diagnosed with more of a gluteal tendinitis. Patient was doing home exercises. Patient was to do over-the-counter medications and icing protocol. Patient states She was doing minorly better for some time but unfortunate is starting have worsening symptoms. Patient states that now it seems to go bilateral. Goes across her back. Mild radiation into both buttocks. None down the legs. Past medical history is significant for lung sarcoidosis. and she is concerned something else is going on. Recent weight gain. Has been trying to walk a more regular basis but is finding it more difficult secondary to the pain. Has to take more breaks. Thickening daily activities and is waking her up at night.    Past Medical History:  Diagnosis Date  . Chest pain at rest 11/07/2012  . Eosinophilic esophagitis 0000000  . GERD (gastroesophageal reflux disease)   . Hypothyroidism   . Sarcoidosis ([place])   . Shortness of breath 11/08/2012   RECENTLY  & WHEN i EXERCISE"  . Thyroid disease    Past Surgical History:  Procedure Laterality Date  . ABDOMINAL HYSTERECTOMY     Partial  . ESOPHAGOGASTRODUODENOSCOPY (EGD) WITH ESOPHAGEAL DILATION N/A 11/10/2012   Procedure: ESOPHAGOGASTRODUODENOSCOPY (EGD) WITH ESOPHAGEAL DILATION;  Surgeon: Gatha Mayer, MD;  Location: [facility];  Service: Endoscopy;  Laterality: N/A;  [place] vs. Balloon  .  VIDEO BRONCHOSCOPY WITH ENDOBRONCHIAL ULTRASOUND Bilateral 01/15/2015   Procedure: VIDEO BRONCHOSCOPY WITH ENDOBRONCHIAL ULTRASOUND of  LEFT LUNG LOWER LOBE and right lung;  Surgeon: Collene Gobble, MD;  Location: [facility];  Service: Thoracic;  Laterality: Bilateral;   Social History   Social History  . Marital status: Divorced    Spouse name: N/A  . Number of children: 3  . Years of education: 14   Occupational History  . Unemployed    Social History Main Topics  . Smoking status: Never Smoker  . Smokeless tobacco: Never Used  . Alcohol use No  . Drug use: No  . Sexual activity: No   Other Topics Concern  . None   Social History Narrative   Fun: Anything and everything   Allergies  Allergen Reactions  . Penicillins Shortness Of Breath and Rash   Family History  Problem Relation Age of Onset  . Diabetes Mother   . Stroke Mother   . Coronary artery disease Father   . Coronary artery disease Brother   . Hypothyroidism Maternal Grandmother   . Diabetes Maternal Grandfather   . Heart disease Paternal Grandmother   . Heart disease Paternal Grandfather     Past medical history, social, surgical and family history all reviewed in electronic medical record.  No pertanent information unless stated regarding to the chief complaint.   Review of Systems: No headache, visual changes, nausea, vomiting, diarrhea, constipation, dizziness, abdominal pain, skin rash, fevers, chills, night sweats, weight loss, swollen lymph nodes, body aches, joint swelling, muscle aches,  chest pain, shortness of breath, mood changes.   Objective  Blood pressure 138/84, pulse 67, height 5\' 2"  (1.575 m), weight 198 lb (89.8 kg), SpO2 94 %.  Systems examined below as of 03/01/16 General: NAD A&O x3 mood, affect normal  HEENT: Pupils equal, extraocular movements intact no nystagmus Respiratory: not short of breath at rest or with speaking Cardiovascular: No lower extremity edema, non tender Skin: Warm dry  intact with no signs of infection or rash on extremities or on axial skeleton. Abdomen: Soft nontender, no masses Neuro: Cranial nerves  intact, neurovascularly intact in all extremities with 2+ DTRs and 2+ pulses. Lymph: No lymphadenopathy appreciated today  Gait normal with good balance and coordination.  MSK: Non tender with full range of motion and good stability and symmetric strength and tone of shoulders, elbows, wrist,  knee hips and ankles bilaterally.   Hip: Right ROM IR: 15 Deg, ER: 45 Deg, Flexion: 120 Deg, Extension: 100 Deg, Abduction: 45 Deg, Adduction: 15 Deg Strength IR: 5/5, ER: 5/5, Flexion: 5/5, Extension: 5/5, Abduction: 5/5, Adduction: 5/5 Pelvic alignment unremarkable to inspection and palpation. Standing hip rotation and gait without trendelenburg sign / unsteadiness. Greater trochanter without tenderness to palpation. Positive Faber Pain over both sacroiliac joint. Increasing tightness of the hamstrings bilaterally. Seems worse than previous exam    Impression and Recommendations:     This case required medical decision making of moderate complexity.      Note: This dictation was prepared with Dragon dictation along with smaller phrase technology. Any transcriptional errors that result from this process are unintentional.

## 2016-03-04 ENCOUNTER — Other Ambulatory Visit: Payer: Self-pay

## 2016-03-23 ENCOUNTER — Other Ambulatory Visit: Payer: Self-pay

## 2016-03-31 ENCOUNTER — Ambulatory Visit: Payer: Self-pay | Admitting: Internal Medicine

## 2016-04-08 ENCOUNTER — Other Ambulatory Visit: Payer: Self-pay | Admitting: Adult Health

## 2016-05-15 ENCOUNTER — Other Ambulatory Visit: Payer: Self-pay | Admitting: Family

## 2016-05-17 ENCOUNTER — Encounter: Payer: Self-pay | Admitting: Family

## 2016-05-17 ENCOUNTER — Other Ambulatory Visit (INDEPENDENT_AMBULATORY_CARE_PROVIDER_SITE_OTHER): Payer: Self-pay

## 2016-05-17 ENCOUNTER — Other Ambulatory Visit: Payer: Self-pay | Admitting: Adult Health

## 2016-05-17 ENCOUNTER — Other Ambulatory Visit: Payer: Self-pay

## 2016-05-17 DIAGNOSIS — E038 Other specified hypothyroidism: Secondary | ICD-10-CM

## 2016-05-17 LAB — TSH: TSH: 3.11 u[IU]/mL (ref 0.35–4.50)

## 2016-05-17 MED ORDER — LEVOTHYROXINE SODIUM 137 MCG PO TABS: ORAL_TABLET | ORAL | 0 refills | Status: DC

## 2016-05-20 ENCOUNTER — Ambulatory Visit (INDEPENDENT_AMBULATORY_CARE_PROVIDER_SITE_OTHER): Payer: Self-pay | Admitting: Podiatry

## 2016-05-20 ENCOUNTER — Encounter: Payer: Self-pay | Admitting: Podiatry

## 2016-05-20 VITALS — Resp 16 | Ht 62.5 in | Wt 194.0 lb

## 2016-05-20 DIAGNOSIS — L600 Ingrowing nail: Secondary | ICD-10-CM

## 2016-05-20 NOTE — Patient Instructions (Signed)

## 2016-05-21 NOTE — Progress Notes (Signed)
Subjective:     Patient ID: Katrina [name], female   DOB: June 10, 1955, 61 y.o.   MRN: [id number]  HPI patient presents stating she's had a lot of pain in her big toe of her right foot and is had drainage which is not present currently with redness and has tried to soak it and trim it without relief   Review of Systems  All other systems reviewed and are negative.      Objective:   Physical Exam  Constitutional: She is oriented to person, place, and time.  Cardiovascular: Intact distal pulses.   Musculoskeletal: Normal range of motion.  Neurological: She is oriented to person, place, and time.  Skin: Skin is warm.  Nursing note and vitals reviewed.  neurovascular status was found to be intact muscle strength was adequate range of motion within normal limits with patient found have incurvated right hallux medial border that's painful when pressed with distal redness noted and patient's noted to have good digital perfusion and is well oriented 3     Assessment:     Ingrown toenail deformity right hallux medial border localized in nature with no proximal indications of infection    Plan:     H&P condition reviewed and recommended removal of nail border. Explained procedure and risk and patient wants surgery and today I infiltrated the right hallux 60 mg Xylocaine Marcaine mixture remove the border exposed matrix and applied phenol 3 applications 30 seconds followed by alcohol lavage and sterile dressing. Gave instructions on soaks and reappoint

## 2016-05-27 ENCOUNTER — Telehealth: Payer: Self-pay | Admitting: Family

## 2016-05-27 MED ORDER — LEVOTHYROXINE SODIUM 137 MCG PO TABS: ORAL_TABLET | ORAL | 0 refills | Status: DC

## 2016-05-27 NOTE — Telephone Encounter (Signed)
Rx sent 

## 2016-05-27 NOTE — Telephone Encounter (Signed)
levothyroxine (SYNTHROID) 137 MCG tablet   Patient is requesting a full refill on this medication. She would like it to [facility] on [address]. Please follow up, Thank you.

## 2016-07-06 ENCOUNTER — Emergency Department (HOSPITAL_COMMUNITY): Payer: Self-pay

## 2016-07-06 ENCOUNTER — Encounter (HOSPITAL_COMMUNITY): Payer: Self-pay | Admitting: Emergency Medicine

## 2016-07-06 ENCOUNTER — Emergency Department (HOSPITAL_COMMUNITY)
Admission: EM | Admit: 2016-07-06 | Discharge: 2016-07-06 | Disposition: A | Discharge status: Home / Self Care | Payer: Self-pay | Attending: Emergency Medicine | Admitting: Emergency Medicine

## 2016-07-06 DIAGNOSIS — E039 Hypothyroidism, unspecified: Secondary | ICD-10-CM | POA: Insufficient documentation

## 2016-07-06 DIAGNOSIS — Z79899 Other long term (current) drug therapy: Secondary | ICD-10-CM | POA: Insufficient documentation

## 2016-07-06 DIAGNOSIS — Z7982 Long term (current) use of aspirin: Secondary | ICD-10-CM | POA: Insufficient documentation

## 2016-07-06 DIAGNOSIS — R0602 Shortness of breath: Secondary | ICD-10-CM

## 2016-07-06 DIAGNOSIS — R072 Precordial pain: Secondary | ICD-10-CM

## 2016-07-06 DIAGNOSIS — R0789 Other chest pain: Secondary | ICD-10-CM

## 2016-07-06 LAB — BASIC METABOLIC PANEL
ANION GAP: 10 (ref 5–15)
BUN: 9 mg/dL (ref 6–20)
CO2: 24 mmol/L (ref 22–32)
CREATININE: 0.81 mg/dL (ref 0.44–1.00)
Calcium: 9.5 mg/dL (ref 8.9–10.3)
Chloride: 104 mmol/L (ref 101–111)
GFR calc Af Amer: >60 mL/min (ref >60)
GFR calc non Af Amer: >60 mL/min (ref >60)
GLUCOSE: 117 mg/dL — AB (ref 65–99)
POTASSIUM: 4.1 mmol/L (ref 3.5–5.1)
SODIUM: 138 mmol/L (ref 135–145)

## 2016-07-06 LAB — CBC
HEMATOCRIT: 43.6 % (ref 36.0–46.0)
HEMOGLOBIN: 14.7 g/dL (ref 12.0–15.0)
MCH: 27.9 pg (ref 26.0–34.0)
MCHC: 33.7 g/dL (ref 30.0–36.0)
MCV: 82.9 fL (ref 78.0–100.0)
PLATELETS: 264 10*3/uL (ref 150–400)
RBC: 5.26 MIL/uL — AB (ref 3.87–5.11)
RDW: 13.7 % (ref 11.5–15.5)
WBC: 8.1 10*3/uL (ref 4.0–10.5)

## 2016-07-06 LAB — I-STAT TROPONIN, ED: Troponin i, poc: 0 ng/mL (ref 0.00–0.08)

## 2016-07-06 NOTE — ED Notes (Signed)
Rounded on patient aware she is waiting on treatment room , no further complaints.

## 2016-07-06 NOTE — ED Provider Notes (Signed)
[facility] DEPT Provider Note   CSN: [id number] Arrival date & time: 07/06/16  1036     History   Chief Complaint Chief Complaint  Patient presents with  . Shortness of Breath  . Chest Pain    HPI Katrina [name] is a 61 y.o. female with pertinent pmh of obesity, hypothyroidism, GERD, esophageal stricture s/p dilation, dysphagia and epigastric pain presents to ED reporting one episode of central chest "tightness" associated with shortness of breath that started while she was on a walk yesterday.  Patient stopped walking and went home to rest.  Symptoms did not improve or resolve with rest.  Symptoms persisted until she woke up this morning at 3 AM.  Patient states she has had similar episodes of "chest tightness" with shortness of breath but these symptoms usually start after exercise and while at rest, not during exertional activity.  Patient has tried albuterol inhaler that she has prescribed but states it only makes her "more jittery".  Patient takes PPI for GERD, denies worsening reflux symptoms as of late. No other associated symptoms.   Patient notes previous diagnosis of sarcoidosis but notes her pulmonologist has "cleared" her from this as he does not think her work up was consistent with sarcoidosis, no further work up advised.  HPI  Past Medical History:  Diagnosis Date  . Chest pain at rest 11/07/2012  . Eosinophilic esophagitis 37/16/9678  . GERD (gastroesophageal reflux disease)   . Hypothyroidism   . Sarcoidosis ([place])   . Shortness of breath 11/08/2012   RECENTLY  & WHEN i EXERCISE"  . Thyroid disease     Patient Active Problem List   Diagnosis Date Noted  . Low back pain associated with a spinal disorder other than radiculopathy or spinal stenosis 03/01/2016  . Gluteal tendinitis of right buttock 01/28/2016  . Urinary frequency 12/08/2015  . Myalgia 10/09/2015  . Upper airway cough syndrome 01/26/2015  . Obesity 01/26/2015  . Pulmonary nodules   . Dyspnea  01/01/2015  . Pulmonary sarcoidosis ([place])   . Mediastinal lymphadenopathy 12/26/2014  . Esophageal ring, acquired 11/10/2012  . Dysphagia, unspecified(787.20) 11/09/2012  . Chest pain 11/08/2012  . Hypothyroidism 11/08/2012  . 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 Low back pain 02/21/2008  . Hypothyroidism, acquired 12/28/2007  . GERD 12/28/2007  . 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 HEADACHE 12/28/2007    Past Surgical History:  Procedure Laterality Date  . ABDOMINAL HYSTERECTOMY     Partial  . ESOPHAGOGASTRODUODENOSCOPY (EGD) WITH ESOPHAGEAL DILATION N/A 11/10/2012   Procedure: ESOPHAGOGASTRODUODENOSCOPY (EGD) WITH ESOPHAGEAL DILATION;  Surgeon: Gatha Mayer, MD;  Location: [facility];  Service: Endoscopy;  Laterality: N/A;  [place] vs. Balloon  . VIDEO BRONCHOSCOPY WITH ENDOBRONCHIAL ULTRASOUND Bilateral 01/15/2015   Procedure: VIDEO BRONCHOSCOPY WITH ENDOBRONCHIAL ULTRASOUND of  LEFT LUNG LOWER LOBE and right lung;  Surgeon: Collene Gobble, MD;  Location: [facility];  Service: Thoracic;  Laterality: Bilateral;    OB History    No data available       Home Medications    Prior to Admission medications   Medication Sig Start Date End Date Taking? Authorizing Provider  Aspirin-Acetaminophen-Caffeine (EXCEDRIN PO) Take 1-2 tablets by mouth as directed.     Historical Provider, MD  gabapentin (NEURONTIN) 100 MG capsule Take 2 capsules (200 mg total) by mouth at bedtime. Patient not taking: Reported on 05/20/2016 03/01/16   Lyndal Pulley, DO  levothyroxine (SYNTHROID) 137 MCG tablet take  1 tablet by mouth once daily BEFORE BREAKFAST 05/27/16   Golden Circle, FNP  nortriptyline (PAMELOR) 25 MG capsule Take 1 capsule (25 mg total) by mouth at bedtime. Patient not taking: Reported on 05/20/2016 10/08/15   Tanda Rockers, MD  omeprazole (PRILOSEC) 20 MG capsule Take 20 mg by mouth daily.    Historical Provider, MD  predniSONE (DELTASONE) 50  MG tablet Take 1 tablet (50 mg total) by mouth daily. Patient not taking: Reported on 05/20/2016 03/01/16   Lyndal Pulley, DO  PROAIR HFA 108 (90 BASE) MCG/ACT inhaler Inhale 2 puffs into the lungs every 4 (four) hours as needed. 01/15/15   Historical Provider, MD  traMADol (ULTRAM) 50 MG tablet Take 1 tablet (50 mg total) by mouth every 12 (twelve) hours as needed. Patient not taking: Reported on 05/20/2016 12/29/15   Brunetta Jeans, PA-C    Family History Family History  Problem Relation Age of Onset  . Diabetes Mother   . Stroke Mother   . Coronary artery disease Father   . Coronary artery disease Brother   . Hypothyroidism Maternal Grandmother   . Diabetes Maternal Grandfather   . Heart disease Paternal Grandmother   . Heart disease Paternal Grandfather     Social History Social History  Substance Use Topics  . Smoking status: Never Smoker  . Smokeless tobacco: Never Used  . Alcohol use No     Allergies   Penicillins   Review of Systems Review of Systems  Constitutional: Negative for appetite change, chills and fever.  Eyes: Negative for visual disturbance.  Respiratory: Positive for chest tightness and shortness of breath. Negative for cough and choking.   Cardiovascular: Negative for chest pain, palpitations and leg swelling.  Gastrointestinal: Negative for abdominal pain, constipation, diarrhea, nausea and vomiting.  Endocrine: Negative for cold intolerance.  Genitourinary: Negative for difficulty urinating and hematuria.  Musculoskeletal: Negative for arthralgias.  Skin: Negative for wound.  Neurological: Negative for dizziness, seizures, syncope, weakness, light-headedness, numbness and headaches.  Hematological: Does not bruise/bleed easily.  Psychiatric/Behavioral: Negative.      Physical Exam Updated Vital Signs BP 140/75 (BP Location: Right Arm)   Pulse 64   Temp 98.4 F (36.9 C) (Oral)   Resp 16   SpO2 98%   Physical Exam  Constitutional: She is  oriented to person, place, and time. She appears well-developed and well-nourished.  HENT:  Head: Normocephalic and atraumatic.  Nose: Nose normal.  Mouth/Throat: Oropharynx is clear and moist. No oropharyngeal exudate.  Eyes: Conjunctivae and EOM are normal. Pupils are equal, round, and reactive to light.  Neck: Normal range of motion. Neck supple. No JVD present.  Cardiovascular: Normal rate, regular rhythm, normal heart sounds and intact distal pulses.   No murmur heard. No lower extremity edema.  Pulmonary/Chest: Effort normal and breath sounds normal. No respiratory distress. She has no wheezes. She has no rales. She exhibits no tenderness.  Abdominal: Soft. Bowel sounds are normal. She exhibits no distension and no mass. There is no tenderness. There is no rebound and no guarding.  Musculoskeletal: Normal range of motion. She exhibits no deformity.  Lymphadenopathy:    She has no cervical adenopathy.  Neurological: She is alert and oriented to person, place, and time. No sensory deficit.  Skin: Skin is warm and dry. Capillary refill takes less than 2 seconds.  Psychiatric: She has a normal mood and affect. Her behavior is normal. Judgment and thought content normal.  Nursing note and vitals reviewed.  ED Treatments / Results  Labs (all labs ordered are listed, but only abnormal results are displayed) Labs Reviewed  BASIC METABOLIC PANEL - Abnormal; Notable for the following:       Result Value   Glucose, Bld 117 (*)    All other components within normal limits  CBC - Abnormal; Notable for the following:    RBC 5.26 (*)    All other components within normal limits  I-STAT TROPOININ, ED    EKG  EKG Interpretation  Date/Time:  Tuesday July 06 2016 10:42:52 EDT Ventricular Rate:  67 PR Interval:  160 QRS Duration: 80 QT Interval:  390 QTC Calculation: 412 R Axis:   53 Text Interpretation:  Normal sinus rhythm Possible Left atrial enlargement Baseline wander T wave  abnormality No significant change since last tracing Abnormal ekg Confirmed by Carmin Muskrat  MD ([id number]) on 07/06/2016 2:12:01 PM       Radiology Dg Chest 2 View  Result Date: 07/06/2016 CLINICAL DATA:  Shortness of Breath EXAM: CHEST  2 VIEW COMPARISON:  10/08/2015 FINDINGS: Mild cardiomegaly. Lungs are clear. No effusions. No acute bony abnormality. IMPRESSION: Mild cardiomegaly.  No active disease. Electronically Signed   By: Rolm Baptise M.D.   On: 07/06/2016 11:36    Procedures Procedures (including critical care time)  Medications Ordered in ED Medications - No data to display   Initial Impression / Assessment and Plan / ED Course  I have reviewed the triage vital signs and the nursing notes.  Pertinent labs & imaging results that were available during my care of the patient were reviewed by me and considered in my medical decision making (see chart for details).    Pt is a 61 y.o. female presents with chest tightness and shortness of breath.  Atypical chest pain, as it did not resolve with rest.  May be respiratory, asthma or poor endurance as it appears to happen during CV exercise only. Possibly GI as pt has h/o GERD and esophageal stricture and dysphagia.  Pertinent risk factors include obesity and age of 35.  On exam VS are within normal limits. RRR without new murmurs, symmetric pulses bilaterally, no LE edema.  Patient w/o pain in ED.  CXR, EKG, troponin x 1 within normal limits.  CBC and BMP unremarkable.  Heart score = 2.  Patient is to be discharged with recommendation to follow up with PCP or cardiologist in regards to today's hospital visit. It is possible that chest pain could be due to cardiac etiology however given lab work and low risk HEART score, patient is low risk and is safe for discharge for outpatient cardiac work up.  Ed return precautions given. Pt appears reliable for follow up and is agreeable to discharge. Patient is in no acute distress.   Final Clinical  Impressions(s) / ED Diagnoses   Final diagnoses:  Precordial pain  Shortness of breath  Chest tightness    New Prescriptions Discharge Medication List as of 07/06/2016  2:24 PM       Kinnie Feil, PA-C 07/06/16 2204    Carmin Muskrat, MD 07/07/16 [phone]

## 2016-07-06 NOTE — Discharge Instructions (Signed)
As we discussed your chest x-rays, EKG, lab work were reassuring today.  You are a low risk patient and you have less than a 2% chance of having a major adverse cardiac event in the next 30 days.  You should contact your primary care provider to discuss today's visit and your symptoms.  We recommend further outpatient work up to rule out cardiac related causes to your symptoms.  Please see cardiologist contact information above.   Return to the emergency department if you develop chest pain or discomfort associated with exercise that worsens with activity and is relieved by rest, or is associated with shortness of breath, dizziness, nausea, vomiting or sweating.   Continue taking your medications as prescribed.  Albuterol inhaler may be used as needed for chest tightness.

## 2016-07-06 NOTE — ED Triage Notes (Signed)
Pt arrives via POV from home with chest pain and SOB that began yesterday. Pt states was out walking yesterdat and didn't feel well so went home to go to sleep. States woke up in the middle of the night and didn't feel any better. Pt awake, alert, oriented x4, lungs CTA. Denies recent fever. Reports non productive cough. VSS.

## 2016-08-13 ENCOUNTER — Ambulatory Visit (HOSPITAL_COMMUNITY)
Admission: RE | Admit: 2016-08-13 | Discharge: 2016-08-13 | Disposition: A | Discharge status: Home / Self Care | Payer: Self-pay | Source: Ambulatory Visit | Attending: Family | Admitting: Family

## 2016-08-13 ENCOUNTER — Ambulatory Visit (INDEPENDENT_AMBULATORY_CARE_PROVIDER_SITE_OTHER): Payer: Self-pay | Admitting: Family

## 2016-08-13 ENCOUNTER — Encounter: Payer: Self-pay | Admitting: Family

## 2016-08-13 DIAGNOSIS — M79661 Pain in right lower leg: Secondary | ICD-10-CM

## 2016-08-13 NOTE — Progress Notes (Signed)
Subjective:    Patient ID: Katrina [name], female    DOB: 12/05/1955, 62 y.o.   MRN: [id number]  Chief Complaint  Patient presents with  . Leg Pain    x3 weeks has had pain in her lower right leg,     HPI:  Katrina [name] is a 61 y.o. female who  has a past medical history of Chest pain at rest (11/07/2012); Eosinophilic esophagitis (29/51/8841); GERD (gastroesophageal reflux disease); Hypothyroidism; Sarcoidosis; Shortness of breath (11/08/2012); and Thyroid disease. and presents today for an acute office visit.  This is a new problem. Associated symptom of pain located in her  Her right distal lower extremity has been going on for about 3 weeks starting after a 6 hour car ride. Severity is described as severe and describes the pain as if she was at the gym and if she worked out too much. No trauma or injury that she can recall. Modifying factors include Advil which has not helped very much. Not currently on birth control and no tobacco usage. Course of the symptoms has stayed about the same with no significant improvement.   Allergies  Allergen Reactions  . Penicillins Shortness Of Breath and Rash      Outpatient Medications Prior to Visit  Medication Sig Dispense Refill  . levothyroxine (SYNTHROID) 137 MCG tablet take 1 tablet by mouth once daily BEFORE BREAKFAST 90 tablet 0  . omeprazole (PRILOSEC) 20 MG capsule Take 20 mg by mouth daily.    . Aspirin-Acetaminophen-Caffeine (EXCEDRIN PO) Take 1-2 tablets by mouth as directed.     . gabapentin (NEURONTIN) 100 MG capsule Take 2 capsules (200 mg total) by mouth at bedtime. (Patient not taking: Reported on 05/20/2016) 60 capsule 3  . nortriptyline (PAMELOR) 25 MG capsule Take 1 capsule (25 mg total) by mouth at bedtime. (Patient not taking: Reported on 05/20/2016) 30 capsule 2  . predniSONE (DELTASONE) 50 MG tablet Take 1 tablet (50 mg total) by mouth daily. (Patient not taking: Reported on 05/20/2016) 5 tablet 0  . PROAIR HFA 108 (90 BASE)  MCG/ACT inhaler Inhale 2 puffs into the lungs every 4 (four) hours as needed.  0  . traMADol (ULTRAM) 50 MG tablet Take 1 tablet (50 mg total) by mouth every 12 (twelve) hours as needed. (Patient not taking: Reported on 05/20/2016) 10 tablet 0   No facility-administered medications prior to visit.       Past Surgical History:  Procedure Laterality Date  . ABDOMINAL HYSTERECTOMY     Partial  . ESOPHAGOGASTRODUODENOSCOPY (EGD) WITH ESOPHAGEAL DILATION N/A 11/10/2012   Procedure: ESOPHAGOGASTRODUODENOSCOPY (EGD) WITH ESOPHAGEAL DILATION;  Surgeon: Gatha Mayer, MD;  Location: [facility];  Service: Endoscopy;  Laterality: N/A;  [place] vs. Balloon  . VIDEO BRONCHOSCOPY WITH ENDOBRONCHIAL ULTRASOUND Bilateral 01/15/2015   Procedure: VIDEO BRONCHOSCOPY WITH ENDOBRONCHIAL ULTRASOUND of  LEFT LUNG LOWER LOBE and right lung;  Surgeon: Collene Gobble, MD;  Location: [facility];  Service: Thoracic;  Laterality: Bilateral;      Past Medical History:  Diagnosis Date  . Chest pain at rest 11/07/2012  . Eosinophilic esophagitis 66/09/3014  . GERD (gastroesophageal reflux disease)   . Hypothyroidism   . Sarcoidosis   . Shortness of breath 11/08/2012   RECENTLY  & WHEN i EXERCISE"  . Thyroid disease       Review of Systems  Constitutional: Negative for chills and fever.  Respiratory: Positive for chest tightness.   Cardiovascular: Negative for chest pain.  Musculoskeletal:  Positive for calf pain      Objective:    BP 130/84 (BP Location: Left Arm, Patient Position: Sitting, Cuff Size: Large)   Pulse 65   Temp 98.4 F (36.9 C) (Oral)   Resp 16   Ht 5' 2.5" (1.588 m)   Wt 192 lb 1.9 oz (87.1 kg)   SpO2 93%   BMI 34.58 kg/m  Nursing note and vital signs reviewed.  Physical Exam  Constitutional: She is oriented to person, place, and time. She appears well-developed and well-nourished. No distress.  Cardiovascular: Normal rate, regular rhythm, normal heart sounds and intact distal  pulses.   Pulmonary/Chest: Effort normal and breath sounds normal.  Musculoskeletal:  Right lower extremity - no obvious deformity, discoloration, or edema. Palpable tenderness along medial calf.  Neurological: She is alert and oriented to person, place, and time.  Skin: Skin is warm and dry.  Psychiatric: She has a normal mood and affect. Her behavior is normal. Judgment and thought content normal.       Assessment & Plan:   Problem List Items Addressed This Visit      Other   Right calf pain    New-onset right calf pain 3 weeks with no significant improvements following a 6 Hour travel without stopping. Does express chest tightness. Concern for DVT. Obtain STAT Dopplers. Differentials also include a calf strain. Follow up and additional treatment pending doppler results.       Relevant Orders   VAS [place] LOWER EXTREMITY VENOUS (DVT)       I have discontinued Ms. Weil's Aspirin-Acetaminophen-Caffeine (EXCEDRIN PO), PROAIR HFA, nortriptyline, traMADol, predniSONE, and gabapentin. I am also having her maintain her omeprazole and levothyroxine.   Follow-up: Return if symptoms worsen or fail to improve.  Mauricio Po, FNP

## 2016-08-13 NOTE — Assessment & Plan Note (Signed)
New-onset right calf pain 3 weeks with no significant improvements following a 6 Hour travel without stopping. Does express chest tightness. Concern for DVT. Obtain STAT Dopplers. Differentials also include a calf strain. Follow up and additional treatment pending doppler results.

## 2016-08-13 NOTE — Progress Notes (Signed)
VASCULAR LAB PRELIMINARY  PRELIMINARY  PRELIMINARY  PRELIMINARY  Right lower extremity venous duplex completed.    Preliminary report:  Right:  No evidence of DVT, superficial thrombosis, or Baker's cyst.  Jacolyn Joaquin, RVS 08/13/2016, 4:49 PM

## 2016-08-13 NOTE — Patient Instructions (Addendum)
Thank you for choosing [facility].  SUMMARY AND INSTRUCTIONS:  Recommend ice and Tylenol for pain.   We will schedule for a venous doppler to rule out a blood clot.  Medication:  Your prescription(s) have been submitted to your pharmacy or been printed and provided for you. Please take as directed and contact our office if you believe you are having problem(s) with the medication(s) or have any questions.  Follow up:  If your symptoms worsen or fail to improve, please contact our office for further instruction, or in case of emergency go directly to the emergency room at the closest medical facility.    Deep Vein Thrombosis A deep vein thrombosis (DVT) is a blood clot (thrombus) that usually occurs in a deep, larger vein of the lower leg or the pelvis, or in an upper extremity such as the arm. These are dangerous and can lead to serious and even life-threatening complications if the clot travels to the lungs. A DVT can damage the valves in your leg veins so that instead of flowing upward, the blood pools in the lower leg. This is called post-thrombotic syndrome, and it can result in pain, swelling, discoloration, and sores on the leg. What are the causes? A DVT is caused by the formation of a blood clot in your leg, pelvis, or arm. Usually, several things contribute to the formation of blood clots. A clot may develop when:  Your blood flow slows down.  Your vein becomes damaged in some way.  You have a condition that makes your blood clot more easily. What increases the risk? A DVT is more likely to develop in:  People who are older, especially over 46 years of age.  People who are overweight (obese).  People who sit or lie still for a long time, such as during long-distance travel (over 4 hours), bed rest, hospitalization, or during recovery from certain medical conditions like a stroke.  People who do not engage in much physical activity (sedentary lifestyle).  People  who have chronic breathing disorders.  People who have a personal or family history of blood clots or blood clotting disease.  People who have peripheral vascular disease (PVD), diabetes, or some types of cancer.  People who have heart disease, especially if the person had a recent heart attack or has congestive heart failure.  People who have neurological diseases that affect the legs (leg paresis).  People who have had a traumatic injury, such as breaking a hip or leg.  People who have recently had major or lengthy surgery, especially on the hip, knee, or abdomen.  People who have had a central line placed inside a large vein.  People who take medicines that contain the hormone estrogen. These include birth control pills and hormone replacement therapy.  Pregnancy or during childbirth or the postpartum period.  Long plane flights (over 8 hours). What are the signs or symptoms?   Symptoms of a DVT can include:  Swelling of your leg or arm, especially if one side is much worse.  Warmth and redness of your leg or arm, especially if one side is much worse.  Pain in your arm or leg. If the clot is in your leg, symptoms may be more noticeable or worse when you stand or walk.  A feeling of pins and needles, if the clot is in the arm. The symptoms of a DVT that has traveled to the lungs (pulmonary embolism, PE) usually start suddenly and include:  Shortness of breath while  active or at rest.  Coughing or coughing up blood or blood-tinged mucus.  Chest pain that is often worse with deep breaths.  Rapid or irregular heartbeat.  Feeling light-headed or dizzy.  Fainting.  Feeling anxious.  Sweating. There may also be pain and swelling in a leg if that is where the blood clot started. These symptoms may represent a serious problem that is an emergency. Do not wait to see if the symptoms will go away. Get medical help right away. Call your local emergency services (911 in the  [place] Do not drive yourself to the hospital.  How is this diagnosed? Your health care provider will take a medical history and perform a physical exam. You may also have other tests, including:  Blood tests to assess the clotting properties of your blood.  Imaging tests, such as CT, ultrasound, MRI, X-ray, and other tests to see if you have clots anywhere in your body. How is this treated? After a DVT is identified, it can be treated. The type of treatment that you receive depends on many factors, such as the cause of your DVT, your risk for bleeding or developing more clots, and other medical conditions that you have. Sometimes, a combination of treatments is necessary. Treatment options may be combined and include:  Monitoring the blood clot with ultrasound.  Taking medicines by mouth, such as newer blood thinners (anticoagulants), thrombolytics, or warfarin.  Taking anticoagulant medicine by injection or through an IV tube.  Wearing compression stockings or using different types ofdevices.  Surgery (rare) to remove the blood clot or to place a filter in your abdomen to stop the blood clot from traveling to your lungs. Treatments for a DVT are often divided into immediate treatment and long-term treatment (up to 3 months after DVT). You can work with your health care provider to choose the treatment program that is best for you. Follow these instructions at home: If you are taking a newer oral anticoagulant:   Take the medicine every single day at the same time each day.  Understand what foods and drugs interact with this medicine.  Understand that there are no regular blood tests required when using this medicine.  Understand the side effects of this medicine, including excessive bruising or bleeding. Ask your health care provider or pharmacist about other possible side effects. If you are taking warfarin:   Understand how to take warfarin and know which foods can affect how  warfarin works in Veter[employer].  Understand that it is dangerous to take too much or too little warfarin. Too much warfarin increases the risk of bleeding. Too little warfarin continues to allow the risk for blood clots.  Follow your PT and INR blood testing schedule. The PT and INR results allow your health care provider to adjust your dose of warfarin. It is very important that you have your PT and INR tested as often as told by your health care provider.  Avoid major changes in your diet, or tell your health care provider before you change your diet. Arrange a visit with a registered dietitian to answer your questions. Many foods, especially foods that are high in vitamin K, can interfere with warfarin and affect the PT and INR results. Eat a consistent amount of foods that are high in vitamin K, such as:  Spinach, kale, broccoli, cabbage, collard greens, turnip greens, Brussels sprouts, peas, cauliflower, seaweed, and parsley.  Beef liver and pork liver.  Green tea.  Soybean oil.  Tell your  health care provider about any and all medicines, vitamins, and supplements that you take, including aspirin and other over-the-counter anti-inflammatory medicines. Be especially cautious with aspirin and anti-inflammatory medicines. Do not take those before you ask your health care provider if it is safe to do so. This is important because many medicines can interfere with warfarin and affect the PT and INR results.  Do not start or stop taking any over-the-counter or prescription medicine unless your health care provider or pharmacist tells you to do so. If you take warfarin, you will also need to do these things:  Hold pressure over cuts for longer than usual.  Tell your dentist and other health care providers that you are taking warfarin before you have any procedures in which bleeding may occur.  Avoid alcohol or drink very small amounts. Tell your health care provider if you change your alcohol  intake.  Do not use tobacco products, including cigarettes, chewing tobacco, and e-cigarettes. If you need help quitting, ask your health care provider.  Avoid contact sports. General instructions   Take over-the-counter and prescription medicines only as told by your health care provider. Anticoagulant medicines can have side effects, including easy bruising and difficulty stopping bleeding. If you are prescribed an anticoagulant, you will also need to do these things:  Hold pressure over cuts for longer than usual.  Tell your dentist and other health care providers that you are taking anticoagulants before you have any procedures in which bleeding may occur.  Avoid contact sports.  Wear a medical alert bracelet or carry a medical alert card that says you have had a PE.  Ask your health care provider how soon you can go back to your normal activities. Stay active to prevent new blood clots from forming.  Make sure to exercise while traveling or when you have been sitting or standing for a long period of time. It is very important to exercise. Exercise your legs by walking or by tightening and relaxing your leg muscles often. Take frequent walks.  Wear compression stockings as told by your health care provider to help prevent more blood clots from forming.  Do not use tobacco products, including cigarettes, chewing tobacco, and e-cigarettes. If you need help quitting, ask your health care provider.  Keep all follow-up appointments with your health care provider. This is important. How is this prevented? Take these actions to decrease your risk of developing another DVT:  Exercise regularly. For at least 30 minutes every day, engage in:  Activity that involves moving your arms and legs.  Activity that encourages good blood flow through your body by increasing your heart rate.  Exercise your arms and legs every hour during long-distance travel (over 4 hours). Drink plenty of water  and avoid drinking alcohol while traveling.  Avoid sitting or lying in bed for long periods of time without moving your legs.  Maintain a weight that is appropriate for your height. Ask your health care provider what weight is healthy for you.  If you are a woman who is over 54 years of age, avoid unnecessary use of medicines that contain estrogen. These include birth control pills.  Do not smoke, especially if you take estrogen medicines. If you need help quitting, ask your health care provider. If you are hospitalized, prevention measures may include:  Early walking after surgery, as soon as your health care provider says that it is safe.  Receiving anticoagulants to prevent blood clots.If you cannot take anticoagulants, other options may  be available, such as wearing compression stockings or using different types of devices. Get help right away if:  You have new or increased pain, swelling, or redness in an arm or leg.  You have numbness or tingling in an arm or leg.  You have shortness of breath while active or at rest.  You have chest pain.  You have a rapid or irregular heartbeat.  You feel light-headed or dizzy.  You cough up blood.  You notice blood in your vomit, bowel movement, or urine. These symptoms may represent a serious problem that is an emergency. Do not wait to see if the symptoms will go away. Get medical help right away. Call your local emergency services (911 in the [place] Do not drive yourself to the hospital. This information is not intended to replace advice given to you by your health care provider. Make sure you discuss any questions you have with your health care provider. Document Released: 04/05/2005 Document Revised: 09/11/2015 Document Reviewed: 07/31/2014 Elsevier Interactive Patient Education  2017 [employer].

## 2016-08-21 ENCOUNTER — Other Ambulatory Visit: Payer: Self-pay | Admitting: Family

## 2016-09-22 ENCOUNTER — Other Ambulatory Visit: Payer: Self-pay | Admitting: Family

## 2016-11-02 ENCOUNTER — Other Ambulatory Visit: Payer: Self-pay | Admitting: Family

## 2016-11-24 ENCOUNTER — Other Ambulatory Visit: Payer: Self-pay | Admitting: *Deleted

## 2016-11-24 ENCOUNTER — Telehealth: Payer: Self-pay | Admitting: *Deleted

## 2016-11-24 DIAGNOSIS — Z1211 Encounter for screening for malignant neoplasm of colon: Secondary | ICD-10-CM

## 2016-11-24 NOTE — Telephone Encounter (Signed)
Spoke with patient and informed her of need for yearly stool study. Hemoccult cards mailed to patient.

## 2016-11-24 NOTE — Telephone Encounter (Signed)
Mailed cards to new address.

## 2016-11-24 NOTE — Telephone Encounter (Signed)
Patient called back states she has moved within the past 3 months new address has been updated in system for cards to be mailed to correct address.

## 2016-11-24 NOTE — Telephone Encounter (Signed)
-----   Message from Marlon Pel, RN sent at 11/25/2015  4:00 PM EDT -----   ----- Message ----- From: Marlon Pel, RN Sent: 11/25/2015   3:59 PM To: Marlon Pel, RN  Needs hemoccult cards - see results 11/25/15- Carlean Purl

## 2016-12-09 ENCOUNTER — Other Ambulatory Visit: Payer: Self-pay | Admitting: Family

## 2016-12-27 ENCOUNTER — Telehealth: Payer: Self-pay | Admitting: Family

## 2016-12-27 DIAGNOSIS — G4710 Hypersomnia, unspecified: Secondary | ICD-10-CM

## 2016-12-27 NOTE — Telephone Encounter (Signed)
Referral placed.

## 2016-12-27 NOTE — Telephone Encounter (Signed)
Pt would like a referral to get a sleep study

## 2017-01-10 ENCOUNTER — Other Ambulatory Visit: Payer: Self-pay | Admitting: Family

## 2017-01-13 ENCOUNTER — Other Ambulatory Visit: Payer: Self-pay | Admitting: Family

## 2017-01-13 ENCOUNTER — Telehealth: Payer: Self-pay | Admitting: Internal Medicine

## 2017-01-13 NOTE — Telephone Encounter (Signed)
Spoke with pt, advised she would have to have an OV to be prescribed an inhaler since she has not been seen in over a year. Pt agreed and made an appt on Monday with MW 01/17/2017. She has started exercising and feels she needs an inhaler. Nothing further is needed.

## 2017-01-17 ENCOUNTER — Other Ambulatory Visit (INDEPENDENT_AMBULATORY_CARE_PROVIDER_SITE_OTHER): Payer: Self-pay

## 2017-01-17 ENCOUNTER — Ambulatory Visit (INDEPENDENT_AMBULATORY_CARE_PROVIDER_SITE_OTHER)
Admission: RE | Admit: 2017-01-17 | Discharge: 2017-01-17 | Disposition: A | Discharge status: Home / Self Care | Payer: Self-pay | Source: Ambulatory Visit | Attending: Internal Medicine | Admitting: Internal Medicine

## 2017-01-17 ENCOUNTER — Other Ambulatory Visit: Payer: Self-pay

## 2017-01-17 ENCOUNTER — Ambulatory Visit (INDEPENDENT_AMBULATORY_CARE_PROVIDER_SITE_OTHER): Payer: Self-pay | Admitting: Internal Medicine

## 2017-01-17 ENCOUNTER — Encounter: Payer: Self-pay | Admitting: Internal Medicine

## 2017-01-17 VITALS — BP 160/84 | HR 66 | Ht 62.5 in | Wt 193.4 lb

## 2017-01-17 DIAGNOSIS — R0609 Other forms of dyspnea: Secondary | ICD-10-CM

## 2017-01-17 DIAGNOSIS — E669 Obesity, unspecified: Secondary | ICD-10-CM

## 2017-01-17 DIAGNOSIS — E038 Other specified hypothyroidism: Secondary | ICD-10-CM

## 2017-01-17 DIAGNOSIS — J45991 Cough variant asthma: Secondary | ICD-10-CM

## 2017-01-17 LAB — BASIC METABOLIC PANEL
BUN: 16 mg/dL (ref 6–23)
CALCIUM: 9.8 mg/dL (ref 8.4–10.5)
CO2: 28 mEq/L (ref 19–32)
Chloride: 100 mEq/L (ref 96–112)
Creatinine, Ser: 0.73 mg/dL (ref 0.40–1.20)
GFR: 85.62 mL/min (ref >60.00)
GLUCOSE: 93 mg/dL (ref 70–99)
POTASSIUM: 4 meq/L (ref 3.5–5.1)
Sodium: 138 mEq/L (ref 135–145)

## 2017-01-17 LAB — CBC WITH DIFFERENTIAL/PLATELET
BASOS ABS: 0.1 10*3/uL (ref 0.0–0.1)
Basophils Relative: 0.7 % (ref 0.0–3.0)
EOS ABS: 0.2 10*3/uL (ref 0.0–0.7)
Eosinophils Relative: 1.9 % (ref 0.0–5.0)
HCT: 45 % (ref 36.0–46.0)
Hemoglobin: 14.9 g/dL (ref 12.0–15.0)
LYMPHS ABS: 2.1 10*3/uL (ref 0.7–4.0)
Lymphocytes Relative: 22.2 % (ref 12.0–46.0)
MCHC: 33.2 g/dL (ref 30.0–36.0)
MCV: 83.7 fl (ref 78.0–100.0)
MONOS PCT: 5.7 % (ref 3.0–12.0)
Monocytes Absolute: 0.5 10*3/uL (ref 0.1–1.0)
NEUTROS ABS: 6.5 10*3/uL (ref 1.4–7.7)
NEUTROS PCT: 69.5 % (ref 43.0–77.0)
PLATELETS: 283 10*3/uL (ref 150.0–400.0)
RBC: 5.37 Mil/uL — AB (ref 3.87–5.11)
RDW: 14.5 % (ref 11.5–15.5)
WBC: 9.4 10*3/uL (ref 4.0–10.5)

## 2017-01-17 LAB — TSH: TSH: 8.39 u[IU]/mL — ABNORMAL HIGH (ref 0.35–4.50)

## 2017-01-17 LAB — BRAIN NATRIURETIC PEPTIDE: PRO B NATRI PEPTIDE: 21 pg/mL (ref 0.0–100.0)

## 2017-01-17 MED ORDER — MOMETASONE FURO-FORMOTEROL FUM 100-5 MCG/ACT IN AERO: 2.0000 | INHALATION_SPRAY | Freq: Two times a day (BID) | RESPIRATORY_TRACT | 11 refills | Status: DC

## 2017-01-17 MED ORDER — MOMETASONE FURO-FORMOTEROL FUM 100-5 MCG/ACT IN AERO: 2.0000 | INHALATION_SPRAY | Freq: Two times a day (BID) | RESPIRATORY_TRACT | 0 refills | Status: DC

## 2017-01-17 NOTE — Progress Notes (Signed)
Subjective:     Patient ID: Katrina [name], female   DOB: 1955/12/12    MRN: [id number]    History of Present Illness  61 yowf never smoker with hx eos esophagitis and doe onset in June 2016 then midline cp while off gerd rx    Date of Admission: 12/26/2014   Date of Discharge: 12/27/2014    Discharge Diagnosis:  Active Problems:  Mediastinal lymphadenopathy  Lung mass  Discharge Medications:   Medication List    STOP taking these medications       lansoprazole 15 MG capsule  Commonly known as: PREVACID     omeprazole-sodium bicarbonate 40-1100 MG per capsule  Commonly known as: ZEGERID     sucralfate 1 GM/10ML suspension  Commonly known as: CARAFATE      TAKE these medications       cimetidine 200 MG tablet  Commonly known as: TAGAMET  Take 200 mg by mouth daily as needed (acid reflux).     levothyroxine 125 MCG tablet  Commonly known as: SYNTHROID, LEVOTHROID  Take 125 mcg by mouth daily before breakfast.        Disposition and follow-up:  [name] was discharged from [facility] in Stable condition. At the hospital follow up visit please address:  1. Mediastinal lymphadenopathy Patient is to follow up with Pulmonology (Dr. Vaughan Browner) as outpatient. His office will be calling the patient with an appointment date for PET scan and Bronchoscopy with Biopsy this week.   2. Labs / imaging needed at time of follow-up: PET scan, Bronchoscopy with Biopsy  3. Pending labs/ test needing follow-up: HIV antibody, Hep C antibody   Follow-up Appointments:     Follow-up Information    Follow up with Marshell Garfinkel, MD.   Specialty: Pulmonary Disease   Why: Pulmonology office will call you with an appointment date next week.    Contact information:   [address] [place] [address] [phone]       Discharge Instructions: Please follow up with Pulmonology as  outpatient. Dr. Matilde Bash office will call you next with an appointment date for PET scan and Bronchoscopy with biopsy. If you do not hear back from them by Tuesday 12/31/14, PLEASE CALL THEIR OFFICE TO SCHEDULE AN APPOINTMENT.   [facility] Pulmonology  [address] [place] [address] [phone]  If you have any problems contacting Pulmonology or have any questions, please do not hesitate to call Dr. Beryle Beams at [phone].  Consultations:   Pulmonology (Dr. Vaughan Browner)  Procedures Performed:   Imaging Results    Dg Chest 2 View  12/26/2014 CLINICAL DATA: Mid chest pain and shortness of breath. EXAM: CHEST 2 VIEW COMPARISON: Chest x-rays dated 11/08/2012 and 02/22/2011 FINDINGS: There is new cardiomegaly since the prior exams. The patient also has right hilar adenopathy with a possible mass at the inferior aspect of the right hilum. There is fullness in the azygos region. Density overlying the lingula on the PA view is most likely due to a cardiac lead electrode. No effusions. No osseous abnormality. IMPRESSION: New right hilar and mediastinal adenopathy. Probable mass at the inferior aspect of the right hilum. Findings are worrisome for malignancy. Electronically Signed By: Lorriane Shire M.D. On: 12/26/2014 12:43   Ct Soft Tissue Neck W Contrast  12/26/2014 CLINICAL DATA: Mediastinal and hilar lymphadenopathy. EXAM: CT NECK WITH CONTRAST TECHNIQUE: Multidetector CT imaging of the neck was performed using the standard protocol following the bolus administration of  intravenous contrast. CONTRAST: 22mL OMNIPAQUE IOHEXOL 300 MG/ML SOLN COMPARISON: None. FINDINGS: Pharynx and larynx: Normal. Salivary glands: Normal. Thyroid: Atrophic. No nodules. Lymph nodes: Normal. No adenopathy in the neck. Vascular: Normal. Limited intracranial: Normal. Visualized orbits: Normal. Mastoids and visualized paranasal sinuses: Normal. Skeleton: Degenerative disc disease  at C5-6 and C6-7. Otherwise normal. Upper chest: 3.6 mm nodule in the posterior aspect of the right upper lobe on image 106 of series 201. This is visible on the prior chest CT. IMPRESSION: Negative CT scan of the neck. No pathologic adenopathy. Electronically Signed By: Lorriane Shire M.D. On: 12/26/2014 19:08   Ct Chest W Contrast  12/26/2014 CLINICAL DATA: New right hilar and mediastinal adenopathy seen on chest x-ray. Shortness of breath for 2 months. EXAM: CT CHEST WITH CONTRAST TECHNIQUE: Multidetector CT imaging of the chest was performed during intravenous contrast administration. CONTRAST: 13mL OMNIPAQUE IOHEXOL 300 MG/ML SOLN COMPARISON: Chest x-ray dated 12/26/2014 FINDINGS: There is no evidence of central pulmonary embolus. The heart and great vessels are normal in appearance. There is a mediastinal and bilateral hilar lymphadenopathy. Enlarged pretracheal an perivascular lymph nodes are seen within the mediastinum. The largest conglomerate of lymph nodes is seen in the right hilum and measures 3.3 x 2.2 cm. There is no evidence of significant focal lung parenchymal consolidation, pleural effusion or pneumothorax. There are however numerous bilateral subpleural and paraseptal pulmonary nodules. The index pulmonary nodule is located within the superior segment of left lower lobe and measures 1.2 x 0.7 by 0.7 cm. Numerous other subpleural soft tissue and ground-glass few mm nodules scattered throughout both lungs. There is a prominent lymph node within the anterior mediastinal pericardial fat. No axillary lymphadenopathy is seen. The visualized portions of the thyroid gland are unremarkable in appearance. The visualized portions of the liver and spleen are unremarkable. The visualized portions of the pancreas, gallbladder, stomach, adrenal glands and kidneys are within normal limits. There is a 3 mm sclerotic focus within the posterior aspect of T4 vertebral body. IMPRESSION:  Mediastinal and bilateral hilar lymphadenopathy. 1.2 cm soft tissue mass within superior segment of left lower lobe. Numerous other bilateral mostly subpleural soft tissue and ground-glass few mm pulmonary nodules. 3 mm sclerotic focus within T4 vertebral body. Overall appearance is suspicious for metastatic disease, or less likely primary lung malignancy. Alternatively, pulmonary sarcoidosis may have a similar appearance. Evaluation with PET-CT and/or tissue diagnosis may be considered. Electronically Signed By: Fidela Salisbury M.D. On: 12/26/2014 15:12   Ct Abdomen Pelvis W Contrast  12/26/2014 CLINICAL DATA: 61 year old female with mediastinal adenopathy seen on the chest CT performed earlier the liver, gallbladder, pancreas, spleen, adrenal glands, kidneys, visualized ureters, and urinary bladder appear unremarkable. Hysterectomy. Today. EXAM: CT ABDOMEN AND PELVIS WITH CONTRAST TECHNIQUE: Multidetector CT imaging of the abdomen and pelvis was performed using the standard protocol following bolus administration of intravenous contrast. CONTRAST: 25mL OMNIPAQUE IOHEXOL 300 MG/ML SOLN COMPARISON: CT dated 11/28/2013 FINDINGS: No intra-abdominal free air or free fluid. The liver, gallbladder, pancreas, spleen, adrenal glands, kidneys, visualized ureters, and urinary bladder appear unremarkable. Hysterectomy. There is irregular and thickened appearance of the wall of the cecum and proximal colon. Although this may be related to presence of residual fecal material the cecal mass is not excluded. Colonoscopy is recommended for better evaluation. There is no evidence of bowel obstruction. The appendix is unremarkable. The abdominal aorta and IVC are patent. A mildly enlarged lymph nodes noted in the region of porta hepatis. Top-normal peripancreatic and portacaval lymph nodes noted,  nonspecific. Midline vertical anterior pelvic wall incisional scar. There is mild degenerative changes of  the spine with no acute fracture. IMPRESSION: Apparent irregularity and thickening of the cecum and ascending colon possibly related to residual fecal content. A cecal mass is not excluded. Colonoscopy recommended for better evaluation. Mildly prominent lymph nodes in the porta hepaticus, nonspecific. No acute intra-abdominal or pelvic pathology identified. Electronically Signed By: Anner Crete M.D. On: 12/26/2014 19:12     Admission HPI: Patient is a 61 yo F with a PMHx of hypothyroidism, GERD, eosinophilic esophagitis presenting to [facility] with progressive SOB and chest pain for the past few weeks. States the pain in her chest is epigastric and substernal. Denies having any cough or sputum production. Patient has a history of acid reflux and currently taking Cimetidine. She was diagnosed with eosinophilic esophagitis in 0109 and biopsy at the time showed inflammation and presence of Schatzki ring. Patient is also complaining of dysphagia to both solids and liquids and hoarseness of voice. States she has noticed a mass on the right side of her neck for the past 1 year, which is painful sometimes. Also complaining of sore throat associated with right ear pain. States she went to see her dentist 3 weeks ago and they checked her for TMJ but she was not told anything about the neck mass. No history of smoking, alcohol, or drug use. Never used any chewing tobacco. States she has been feeling weak and tired for several months. Denies having any fevers or night sweats. Denies any loss of appetite or weight loss. States she noticed blood in her stool back in 06/2014 and was told by GI to get a colonoscopy done. However, due to lack of insurance she never got it done. Patient had a partial hysterectomy in 2002 due to history of fibroids. States her mother had blood cancer (myelodysplastic syndrome?) and died at the age of 72. States she was last sexually active 1 year ago, female partner. As per  patient, she was this partner for 8 years and he had sexual relations with other people. During physical exam, it was noted that the patient had a wart on the plantar surface of her foot.   Hospital Course by problem list: Active Problems:  Mediastinal lymphadenopathy  Lung mass   Mediastinal Lymphadenopathy Patient presented with progressive chest pain and SOB. CT showed Mediastinal and bilateral hilar lymphadenopathy.1.2 cm soft tissue mass within superior segment of left lower lobe. Numerous other bilateral mostly subpleural soft tissue and ground-glass few mm pulmonary nodules.3 mm sclerotic focus within T4 vertebral body. Overall appearance suspicious for metastatic disease, or less likely primary lung malignancy. CT of head and neck negative for any pathologic lymphadenopathy. Patient is a never smoker. She did not have external lymphadenopathhy on exam. ACE level high (99). Normal CBC and blood chemistry profile. Labs including LDH, Uric acid, LFTs, INR, aPTT , Mag, Phos, and Prealbumin normal. Scr 0.73. CT scan of the neck, abdomen, and pelvis was remarkable for a thickened wall of the cecum and proximal colon, however, the appearance of the pathology in the lung is not suggestive of metastatic colon cancer. Patient was seen by pulmonology (Dr. Vaughan Browner) and he suggested outpatient PET scan and bronchoscopy with biopsy. Patient remained clinically stable - vitals were stable and she was not hypoxic at rest. No evidence of superior vena cava syndrome. Pt is to follow up with Pulmonology as outpatient. Dr. Matilde Bash office will call her the week of 12/30/14 to schedule these  procedures. Patient was advised to call pulmonology office if she did not hear back from them by Tuesday 12/31/14. In addition, she was provided with our attending Dr. Azucena Freed cell phone number in case she had any questions or had trouble making an appointment with Pulmonology.   GERD -continued home med Famotidine 20 mg  qd   Hypothyroidism TSH normal.  -continued home med Synthroid 125 mcg qd         01/01/2015 1st [facility] office visit/ Saydie Gerdts   Chief Complaint  Patient presents with  . HFU    Pt states that she has had a HA every day since her hospital d/c. Her breathing has improved over the past 2 days. She states having pain in between her shoulder blades and chest tightness on and off.   sob is 24 /7 and just as bad walking as sitting assoc with epigastric / midline cp that rad to back assoc with dysphagia and can't take ppi (at least not omeprazole) s immediate swelling  Imp: prob sarcoid / ? uacs  rec Stop all advil and aleve and take tylenol  Pepcid 20 mg after bfast and after supper  GERD   Need to keep appt to get PET scan and be sure they send me a copy   01/15/15 > nl airways/  EBUS bx's > granulomatous changes only      01/20/2015  f/u ov/Adom Schoeneck re: uacs ? Related to gerd/ prob sarcoid  Chief Complaint  Patient presents with  . HFU    Pt c/o increased cough, wheezing and congestion since hospital d/c. Cough is prod with large amounts of yellow sputum.    Using lots of cough drops / some proair since procedure rec Tagamet 200  X 2 after breakfast and x 2 after supper Prednisone 10 mg take  4 each am x 2 days,   2 each am x 2 days,  1 each am x 2 days and stop  Please schedule a follow up office visit in 6 weeks, call sooner if needed with cxr on return     03/04/2015  f/u ov/Rheya Minogue re: sarcoid/ all smiles / no need for rescue  Chief Complaint  Patient presents with  . Follow-up    Cough has completely resolved. No new co's today.   rec  Stay on tagamet and diet  Please schedule a follow up visit in 3 months but call sooner if needed with cxr on return    06/09/2015 acute extended ov/Dakwon Wenberg re:  Tagamet 200 mg one twice daily until got sick then 400 bid Chief Complaint  Patient presents with  . Acute Visit    Pt states she has been sick since 06/04/15- ear ache and  congestion- was txed with doxy and pred taper. She states that since then symptoms have settled in her chest- she wakes up SOB, has chest tightness and has prod cough with clear sputum.    similar illness in January 2017 got shot of pred/ po course resolved completely  06/03/15 exp to dust> intching/ sneezing /coughing > all better with otc's except for the cough  06/04/15 rx doxy/pred  - worse p albuterol  Worse breathing/coughing  is 3 am  rec Depomedrol 120 mg IM Stop the tagamet and take zantac 300 mg after bfast and at bedtime  Take delsym two tsp every 12 hours and supplement if needed with  tramadol 50 mg up to 2 every 4 hours  Please schedule a follow up office visit in  2 weeks, sooner if needed - we will do additional allergy tests then       10/08/2015  f/u ov/Ryna Beckstrom re:  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 Follow-up    Pt c/o increased SOB with some CP since working in yard this past weekend. Pt also c/o increasing fatigue for several weeks. Pt denies cough/wheeze/tightness. Pt is concerned about her heart also being affected by sarcoid. Pt does not use rescue inhaler because she does not feel it helps her symptoms. Pt would also like to discuss thyroid meds.   main concern is leg pains after ex x years > ? Dx sle > rheum eval at [facility] > rx nortryptilline x one week resolved 20 y prior to [place]  But intermittently since then not necessarily related to ex and better with advil  Last ex 3 weeks prior to [place] due bilateral ant shin pain and hip pain feels similar in  Terms of location but now pattern is 24/7 better first thing in am  Over did it 10/05/15 > severe fatigue with sob/cough but since that day no more sob or cough / leg pains continue though. rec nortryptilline 25 mg at bedtime trial basis >  Never took but massages cured her myalgias     01/17/2017 acute extended ov/Dannya Pitkin re: new onset doe / chest tightness  Chief Complaint  Patient  presents with  . Acute Visit    Pt states since starting to exercise recently, she is noticing increased SOB, wheezing and occ cough. She also notices some chest discomfort. Her cough has been non prod.  She states she wakes up in the night feeling SOB and gasping for breath.   did fine including moving to new residence then 2 months later which was around Sept 1 2018 started working out and noted poor ex tolerance assoc noct cough nonprod - during ex does fine then 3 h  later  Worse dyspnea/ chest tightness typically  Recovering  overnight  But p  last worked out 01/12/17   did not recover yet with tightness 24/7 worse "p walk x one mile"  maint on  prilosec hs / not on H2's  Since last ov / planning sleep study for noct "gasping"  Has not tried saba to see if helps   No obvious day to day or daytime variability or assoc excess/ purulent sputum or mucus plugs or hemoptysis or cp or     overt sinus or hb symptoms. No unusual exp hx or h/o childhood pna/ asthma or knowledge of premature birth.   Also denies any obvious fluctuation of symptoms with weather or environmental changes or other aggravating or alleviating factors except as outlined above   Current Allergies, Complete Past Medical History, Past Surgical History, Family History, and Social History were reviewed in [employer] record.  ROS  The following are not active complaints unless bolded Hoarseness, sore throat, dysphagia, dental problems, itching, sneezing,  nasal congestion or discharge of excess mucus or purulent secretions, ear ache,   fever, chills, sweats, unintended wt loss or wt gain, classically pleuritic or exertional cp,  orthopnea pnd or leg swelling, presyncope, palpitations, abdominal pain, anorexia, nausea, vomiting, diarrhea  or change in bowel habits or change in bladder habits, change in stools or change in urine, dysuria, hematuria,  rash, arthralgias, visual complaints, headache, numbness, weakness  or ataxia or problems with walking or coordination,  change in mood/affect  or memory.        Current Meds  Medication Sig  . omeprazole (PRILOSEC) 20 MG capsule Take 20 mg by mouth daily.  Marland Kitchen SYNTHROID 137 MCG tablet take 1 tablet by mouth every morning ON AN EMPTY STOMACH.DUE FOR ANNUAL APPT AND LABS                         Objective:   Physical Exam  anxious amb wf nad / very evasive historian   01/20/2015       186> 03/04/2015   187 > 06/09/2015  187 > 06/27/2015  198 > 10/08/2015  190 >  01/17/2017  193     01/01/15 83.462 kg (184 lb)  12/26/14 84.2 kg (185 lb 10 oz)  12/02/14 84.511 kg (186 lb 5 oz)    Vital signs reviewed  - Note on arrival 02 sats  96% on RA     HEENT: nl dentition, turbinates, and oropharyxnx. Nl external ear canals without cough reflex   NECK :  without JVD/Nodes/TM/ nl carotid upstrokes bilaterally   LUNGS: no acc muscle use, classic pseudowheeze on FVC maneuver, resolved with plm    CV:  RRR  no s3 or murmur or increase in P2, no edema   ABD:  soft and nontender with nl excursion in the supine position. No bruits or organomegaly, bowel sounds nl  MS:  warm without deformities, calf tenderness, cyanosis or clubbing  SKIN: warm and dry without lesions    NEURO:  alert, approp, no deficits     CXR PA and Lateral:   01/17/2017 :    I personally reviewed images and agree with radiology impression as follows:    No active cardiopulmonary disease.    Labs ordered/ reviewed:      Chemistry      Component Value Date/Time   NA 138 01/17/2017 1617   K 4.0 01/17/2017 1617   CL 100 01/17/2017 1617   CO2 28 01/17/2017 1617   BUN 16 01/17/2017 1617   CREATININE 0.73 01/17/2017 1617      Component Value Date/Time   CALCIUM 9.8 01/17/2017 1617   ALKPHOS 76 01/13/2015 1150   AST 25 01/13/2015 1150   ALT 30 01/13/2015 1150   BILITOT 0.8 01/13/2015 1150        Lab Results  Component Value Date   WBC 9.4 01/17/2017   HGB 14.9  01/17/2017   HCT 45.0 01/17/2017   MCV 83.7 01/17/2017   PLT 283.0 01/17/2017     Lab Results  Component Value Date   DDIMER 0.72 (H) 01/17/2017      Lab Results  Component Value Date   TSH 8.39 (H) 01/17/2017     Lab Results  Component Value Date   PROBNP 21.0 01/17/2017                 Assessment:

## 2017-01-17 NOTE — Patient Instructions (Addendum)
Prilosec Take 30- 60 min before your first and last meals of the day   GERD (REFLUX)  is an extremely common cause of respiratory symptoms just like yours , many times with no obvious heartburn at all.    It can be treated with medication, but also with lifestyle changes including elevation of the head of your bed (ideally with 6 inch  bed blocks),  Smoking cessation, avoidance of late meals, excessive alcohol, and avoid fatty foods, chocolate, peppermint, colas, red wine, and acidic juices such as orange juice.  NO MINT OR MENTHOL PRODUCTS SO NO COUGH DROPS   USE SUGARLESS CANDY INSTEAD (Jolley ranchers or Stover's or Life Savers) or even ice chips will also do - the key is to swallow to prevent all throat clearing. NO OIL BASED VITAMINS - use powdered substitutes.    For short of breath/ wheeze ok to try dulera 100 up to 1-2 pffs every 12 hours but if not better please call [facility] at [phone] for CPST   Please remember to go to the lab and x-ray department downstairs in the basement  for your tests - we will call you with the results when they are available.     Please schedule a follow up office visit in 4 weeks, sooner if needed with all active meds in hand if better and if not call [facility] at [phone] to schedule cpst

## 2017-01-18 ENCOUNTER — Telehealth: Payer: Self-pay | Admitting: Internal Medicine

## 2017-01-18 DIAGNOSIS — J45991 Cough variant asthma: Secondary | ICD-10-CM | POA: Insufficient documentation

## 2017-01-18 LAB — RESPIRATORY ALLERGY PROFILE REGION II ~~LOC~~
Allergen, Cedar tree, t12: <0.1 kU/L
Allergen, D pternoyssinus,d7: 0.26 kU/L — ABNORMAL HIGH
Allergen, Mouse Urine Protein, e78: <0.1 kU/L
Allergen, Mulberry, t76: <0.1 kU/L
Allergen, Oak,t7: <0.1 kU/L
Allergen, P. notatum, m1: <0.1 kU/L
CAT DANDER: 2.06 kU/L — AB
CLASS: 0
CLASS: 0
CLASS: 0
CLASS: 0
CLASS: 0
CLASS: 0
CLASS: 0
CLASS: 0
CLASS: 0
CLASS: 0
CLASS: 0
CLASS: 0
CLASS: 2
Class: 0
Class: 0
Class: 0
Class: 0
Class: 0
Class: 0
Class: 0
Class: 0
Class: 0
Class: 1
Class: 1
Cockroach: <0.1 kU/L
D. farinae: 0.49 kU/L — ABNORMAL HIGH
DOG DANDER: 0.42 kU/L — AB
IGE (IMMUNOGLOBULIN E), SERUM: 59 kU/L (ref <=114)
PECAN/HICKORY TREE IGE: 0.15 kU/L — AB
Rough Pigweed  IgE: <0.1 kU/L
Timothy Grass: <0.1 kU/L

## 2017-01-18 LAB — INTERPRETATION:

## 2017-01-18 LAB — D-DIMER, QUANTITATIVE (NOT AT ARMC): D DIMER QUANT: 0.72 ug{FEU}/mL — AB (ref <0.50)

## 2017-01-18 NOTE — Assessment & Plan Note (Addendum)
Spirometry 01/17/2017  FEV1 1.71 (72%)  Ratio 80 with mild curvature - 01/17/2017  After extensive coaching HFA effectiveness =    75% try duler 100 1-2 bid and if not better needs cpst with spirometry before and after - Allergy profile 01/17/2017 >  Eos 0.2 /  IgE  Pending  - max rx for gerd 01/17/2017 >>>  Very non specific symptoms need to pin down dx here rather than just keep adding empirical rx with cpst if not better which can r/o EIA and pin down cause of doe definitively   Discussed in detail all the  indications, usual  risks and alternatives  relative to the benefits with patient who agrees to proceed with w/u as outlined.  Discussed in detail all the  indications, usual  risks and alternatives  relative to the benefits with patient who agrees to proceed with w/u as outlined  And need to return in 4 weeks if not all better by then with all meds in hand using a trust but verify approach to confirm accurate Medication  Reconciliation The principal here is that until we are certain that the  patients are doing what we've asked, it makes no sense to ask them to do more.

## 2017-01-18 NOTE — Assessment & Plan Note (Addendum)
-   01/01/2015  Walked RA x 3 laps @ 185 ft each stopped due to End of study, fast pace, no sob or desat  - 01/17/2017  Walked RA x 3 laps @ 185 ft each stopped due to  End of study, fast  pace, no   desat   - complained of sob and heart racing at end but note max pulse only 112    Symptoms are markedly disproportionate to objective findings and not clear this is actually much of a  lung problem but pt does appear to have difficult to sort out respiratory symptoms of unknown origin for which  DDX  = almost all start with A and  include Adherence, Ace Inhibitors, Acid Reflux, Active Sinus Disease, Alpha 1 Antitripsin deficiency, Anxiety masquerading as Airways dz,  ABPA,  Allergy(esp in young), Aspiration (esp in elderly), Adverse effects of meds,  Active smokers, A bunch of PE's (a small clot burden can't cause this syndrome unless there is already severe underlying pulm or vascular dz with poor reserve) plus two Bs  = Bronchiectasis and Beta blocker use..and one C= CHF     Adherence is always the initial "prime suspect" and is a multilayered concern that requires a "trust but verify" approach in every patient - starting with knowing how to use medications, especially inhalers, correctly, keeping up with refills and understanding the fundamental difference between maintenance and prns vs those medications only taken for a very short course and then stopped and not refilled.  - see hfa teaching   ? Acid (or non-acid) GERD > always difficult to exclude as up to 75% of pts in some series report no assoc GI/ Heartburn symptoms> rec max (24h)  acid suppression and diet restrictions/ reviewed and instructions given in writing.   ? Allergy / asthma > see cough variant asthma (see separate a/p)   ? Anxiety/depression / deconditioning  > usually at the bottom of this list of usual suspects but should be much higher on this pt's based on H and P and note already on psychotropics .   ? A bunch of PE's :  D dimer   High nl range - while this value  may miss small peripheral pe, the clot burden with sob is moderately high and the d dimer has a very high neg pred value in this setting     ? chf >  bnp less than 100 excludes in this setting    I had an extended discussion with the patient reviewing all relevant studies completed to date and  lasting 25 minutes of a 40  minute acute visit to re-establish p > 1 year     Re new   severe non-specific but potentially very serious refractory respiratory symptoms of uncertain and potentially multiple  etiologies.   Each maintenance medication was reviewed in detail including most importantly the difference between maintenance and prns and under what circumstances the prns are to be triggered using an action plan format that is not reflected in the computer generated alphabetically organized AVS.    Please see AVS for specific instructions unique to this office visit that I personally wrote and verbalized to the the pt in detail and then reviewed with pt  by my nurse highlighting any changes in therapy/plan of care  recommended at today's visit.

## 2017-01-18 NOTE — Progress Notes (Signed)
ATC, NA and no option to leave msg 

## 2017-01-18 NOTE — Assessment & Plan Note (Signed)
Body mass index is 34.81 kg/m.  -  trending up Lab Results  Component Value Date   TSH 8.39 (H) 01/17/2017     Contributing to gerd risk/ doe/reviewed the need and the process to achieve and maintain neg calorie balance > defer f/u primary care including intermittently monitoring thyroid status

## 2017-01-19 NOTE — Telephone Encounter (Signed)
Katrina [name] already spoke with patient 10.3.18 and advised her of her results Will sign off

## 2017-01-19 NOTE — Progress Notes (Signed)
Spoke with pt and notified of results per Dr. Wert. Pt verbalized understanding and denied any questions. 

## 2017-01-21 ENCOUNTER — Telehealth: Payer: Self-pay | Admitting: Family

## 2017-01-21 ENCOUNTER — Other Ambulatory Visit (INDEPENDENT_AMBULATORY_CARE_PROVIDER_SITE_OTHER): Payer: Self-pay

## 2017-01-21 DIAGNOSIS — E039 Hypothyroidism, unspecified: Secondary | ICD-10-CM

## 2017-01-21 LAB — T4, FREE: FREE T4: 0.72 ng/dL (ref 0.60–1.60)

## 2017-01-21 MED ORDER — LEVOTHYROXINE SODIUM 137 MCG PO TABS: ORAL_TABLET | ORAL | 0 refills | Status: DC

## 2017-01-21 NOTE — Telephone Encounter (Signed)
Pt called checking on the results from her TSH that was done on Monday. Please advise.

## 2017-01-21 NOTE — Telephone Encounter (Signed)
Please advise in Greg's absence.

## 2017-01-21 NOTE — Telephone Encounter (Signed)
Pt aware and she will come back to get that redrawn Monday.

## 2017-01-21 NOTE — Telephone Encounter (Signed)
Needs reflex T4, order placed. Can lab add on or else have her come in for draw.

## 2017-01-26 ENCOUNTER — Institutional Professional Consult (permissible substitution): Payer: Self-pay | Admitting: Neurology

## 2017-02-01 ENCOUNTER — Ambulatory Visit (INDEPENDENT_AMBULATORY_CARE_PROVIDER_SITE_OTHER): Payer: Self-pay | Admitting: Neurology

## 2017-02-01 ENCOUNTER — Encounter: Payer: Self-pay | Admitting: Neurology

## 2017-02-01 VITALS — BP 142/83 | HR 71 | Ht 63.0 in | Wt 193.0 lb

## 2017-02-01 DIAGNOSIS — G4719 Other hypersomnia: Secondary | ICD-10-CM

## 2017-02-01 DIAGNOSIS — R0681 Apnea, not elsewhere classified: Secondary | ICD-10-CM

## 2017-02-01 DIAGNOSIS — K219 Gastro-esophageal reflux disease without esophagitis: Secondary | ICD-10-CM

## 2017-02-01 DIAGNOSIS — G4730 Sleep apnea, unspecified: Secondary | ICD-10-CM

## 2017-02-01 DIAGNOSIS — R0683 Snoring: Secondary | ICD-10-CM

## 2017-02-01 DIAGNOSIS — D860 Sarcoidosis of lung: Secondary | ICD-10-CM

## 2017-02-01 DIAGNOSIS — R0602 Shortness of breath: Secondary | ICD-10-CM

## 2017-02-01 NOTE — Progress Notes (Signed)
[facility]INE CL[facility]IC   Provider:  Larey Seat, M D  Primary Care Physician:  Golden Circle, FNP   Referring Provider: Golden Circle, FNP    Chief Complaint  Patient presents with  . New Patient (Initial Visit)    pt alone, rm 11. pt wakes up and she is short of breath. pt daughter woke her up stating that she was having some irregular breathing.     HPI:  Katrina [name] is a 61 y.o. female , seen here as in a referral from NP. Calone for sleep consultation, she he reports waking up in the night short of breath, recently witnessed by her daughter.  Katrina [name] is a 61 year old Caucasian right-handed female presenting for a sleep evaluation after her daughter had witnessed apneic spells. The patient has a history of eosinophilic esophagitis, GERD, hypothyroidism, chest pain at rest, shortness of breath with exertion and obesity. In the year 2014 through 2015 she had developed increasing shortness of breath and was finally evaluated by pulmonology, x-rays, CT scans and even a PET scan were obtained after a pulmonary mass was suspected And a diagnosis of sarcoidosis was made. In the meantime her pulmonologist, Dr. Christinia Gully, is not longer sure that this was the diagnosis. I reviewed her medication list, and her last PCP notes.   Sleep habits are as follows: The patient usually goes to bed between 10 and 10:30 and falls asleep rather promptly. She lives a cool bedroom, quiet and dark. She sleeps alone, but recently had her daughter sleeping over which a to snore and have apnea. She prefers to sleep on her side, on 3 pillows.  She has nocturia twice each night, sometimes 3. She often wakes up this feeling of shortness of air. Sometimes struggling to breathe. She usually wakes up once at 1:30, another time between 3 and 4 AM, these are her bathroom breaks. She has frequently nocturnal palpitations, but no clammy- not feeling hot. She wakes up spontaneously between 7 and 7:30, but  does not feel well rested and restored. She does not report a dry mouth in the morning, no headaches, dizziness or stiffness are reported, no nausea.   The patient reports that she has vivid dreams at night and sometimes her dreams continue in installments. She wakes up goes back to sleep and continues the same dream. She does not report sleep paralysis, but to me that her brother suffers from sleep paralysis. She sometimes reports dream intrusion, but she does report 2 episodes of falling while telling an amotional story- possible cataplexy.   Sleep medical history and family sleep history: menopause 2002, affecting sleep then.    Social history: divorced,3 adult children- healthy. Non smoker, non etoh drinker, caffeine : iced tea 2 glasses a day, soda 1-2 a day, no energy drinks or coffee.  Currently unemployed.  Worked in [employer], research (medical).   Review of Systems: Out of a complete 14 system review, the patient complains of only the following symptoms, and all other reviewed systems are negative.  Snoring, apnea, S of B, facial flushing, obesity , tearful  Epworth score 19/24, Fatigue severity score 50  , depression score 6/ 15    Social History   Social History  . Marital status: Divorced    Spouse name: N/A  . Number of children: 3  . Years of education: 14   Occupational History  . Unemployed    Social History Main Topics  . Smoking status: Never Smoker  . Smokeless tobacco:  Never Used  . Alcohol use No  . Drug use: No  . Sexual activity: No   Other Topics Concern  . Not on file   Social History Narrative   Fun: Anything and everything    Family History  Problem Relation Age of Onset  . Diabetes Mother   . Stroke Mother   . Coronary artery disease Father   . Coronary artery disease Brother   . Hypothyroidism Maternal Grandmother   . Diabetes Maternal Grandfather   . Heart disease Paternal Grandmother   . Heart disease Paternal Grandfather     Past Medical History:    Diagnosis Date  . Chest pain at rest 11/07/2012  . Eosinophilic esophagitis 27/51/7001  . GERD (gastroesophageal reflux disease)   . Hypothyroidism   . Sarcoidosis   . Shortness of breath 11/08/2012   RECENTLY  & WHEN i EXERCISE"  . Thyroid disease     Past Surgical History:  Procedure Laterality Date  . ABDOMINAL HYSTERECTOMY     Partial  . ESOPHAGOGASTRODUODENOSCOPY (EGD) WITH ESOPHAGEAL DILATION N/A 11/10/2012   Procedure: ESOPHAGOGASTRODUODENOSCOPY (EGD) WITH ESOPHAGEAL DILATION;  Surgeon: Gatha Mayer, MD;  Location: [facility];  Service: Endoscopy;  Laterality: N/A;  Maloney vs. Balloon  . VIDEO BRONCHOSCOPY WITH ENDOBRONCHIAL ULTRASOUND Bilateral 01/15/2015   Procedure: VIDEO BRONCHOSCOPY WITH ENDOBRONCHIAL ULTRASOUND of  LEFT LUNG LOWER LOBE and right lung;  Surgeon: Collene Gobble, MD;  Location[facility];  Service: Thoracic;  Laterality: Bilateral;    Current Outpatient Prescriptions  Medication Sig Dispense Refill  . levothyroxine (SYNTHROID) 137 MCG tablet take 1 tablet by mouth every morning ON AN EMPTY STOMACH. 90 tablet 0  . mometasone-formoterol (DULERA) 100-5 MCG/ACT AERO Inhale 2 puffs into the lungs 2 (two) times daily. 1 Inhaler 11  . omeprazole (PRILOSEC) 20 MG capsule Take 20 mg by mouth daily.    Marland Kitchen PROAIR HFA 108 (90 Base) MCG/ACT inhaler inhale 2 puffs if needed every 4 hours 8.5 g 3   No current facility-administered medications for this visit.     Allergies as of 02/01/2017 - Review Complete 02/01/2017  Allergen Reaction Noted  . Penicillins Shortness Of Breath and Rash 12/28/2007    Vitals: BP (!) 142/83   Pulse 71   Ht 5\' 3"  (1.6 m)   Wt 193 lb (87.5 kg)   SpO2 97%   BMI 34.19 kg/m  Last Weight:  Wt Readings from Last 1 Encounters:  02/01/17 193 lb (87.5 kg)   VCB:SWHQ mass index is 34.19 kg/m.     Last Height:   Ht Readings from Last 1 Encounters:  02/01/17 5\' 3"  (1.6 m)    Physical exam:  General: The patient is awake, alert and  appears not in acute distress. The patient is well groomed. Head: Normocephalic, atraumatic. Neck is supple. Mallampati 3 neck circumference:16.5. Nasal airflow patent , TMJ click- not evident . Retrognathia is not seen.  Cardiovascular:  Regular rate and rhythm , without  murmurs or carotid bruit, and without distended neck veins. Respiratory: Lungs are clear to auscultation. Skin:  Without evidence of edema, or rash Trunk: BMI is high - truncal obesity- steroid induced - The patient's posture is erect.   Neurologic exam : The patient is awake and alert, oriented to place and time.   Memory subjective described as intact.  Attention span & concentration ability appears normal. Speech is fluent,  without dysarthria, dysphonia or aphasia. Mood and affect are appropriate.  Cranial nerves: Pupils are equal and  briskly reactive to light. Extraocular movements  in vertical and horizontal planes intact and without nystagmus. Visual fields by finger perimetry are intact.Hearing to finger rub intact.  Facial sensation intact to fine touch. Facial motor strength is symmetric and tongue and uvula move midline. Shoulder shrug was symmetrical.  Motor exam: Normal tone, muscle bulk and symmetric strength in all extremities. Sensory:  Fine touch, pinprick and vibration were tested in all extremities. Proprioception tested in the upper extremities was normal. Coordination: Rapid alternating movements in the fingers/hands was normal. Finger-to-nose maneuver  normal without evidence of ataxia, dysmetria or tremor. Gait and station: Patient walks without assistive device .Tandem gait deferred. Turns with 3 Steps. Romberg testing is  negative. Deep tendon reflexes: in the  upper and lower extremities are symmetric and intact. Babinski maneuver response is downgoing.  Assessment:  After physical and neurologic examination, review of laboratory studies,  Personal review of imaging studies, reports of other /same   Imaging studies, results of polysomnography and / or neurophysiology testing and pre-existing records as far as provided in visit., my assessment is   1)  Katrina [name] now suffers from severe, excessive daytime sleepiness with an Epworth sleepiness score of 19 points out of 24 possible, the fatigue is very high at 50 points, she does suffer from some situational depression- but I think that the reason for her excessive daytime sleepiness is organic and not psychological. Her daughter has witnessed her to snore loudly and has witnessed apneas, these are the most likely cause for the patient's fatigue. Due to her underlying pulmonary disease history she also may suffer from low oxygen levels at night anyway. I consider the presumed diagnosis of sarcoidosis pulmonologist still standing. I would like for her to address some of the additional risk factors, such as obesity, establish a daytime exercise regimen and a low-carb diet.  2) obesity  3) steroid induced weight gain, addisonian.    The patient was advised of the nature of the diagnosed disorder , the treatment options and the  risks for general health and wellness arising from not treating the condition.   I spent more than 45 minutes of face to face time with the patient.  Greater than 50% of time was spent in counseling and coordination of care. We have discussed the diagnosis and differential and I answered the patient's questions.    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  Inhaler causes GERD, GERD causes sleep interrputions. She uses 3 pillows and still has reflux.  Nocturia from OSA ?  EDS with narcolepsy? Will advice to treat OSA first , watch out for hypoxemia/ hypercapnia, if negative than do MSLT.     Larey Seat, MD 03/47/4259, 56:38 AM  Certified in Neurology by  ABPN Certified in [place] by [facility] [address], [place] [address], [place] [address]

## 2017-02-01 NOTE — Patient Instructions (Signed)

## 2017-02-10 ENCOUNTER — Telehealth: Payer: Self-pay

## 2017-02-10 NOTE — Telephone Encounter (Signed)
Called patient this morning to schedule sleep study per orders from Dr. Brett Fairy.  Pt has no insurance and is self pay.  Informed patient of cost and offered payment plan.  Pt declined to schedule at this time.  Pt was informed to call us back if her situation changes.

## 2017-02-14 ENCOUNTER — Ambulatory Visit: Payer: Self-pay | Admitting: Internal Medicine

## 2017-04-18 ENCOUNTER — Ambulatory Visit: Payer: Self-pay | Admitting: Family

## 2017-04-20 ENCOUNTER — Ambulatory Visit (INDEPENDENT_AMBULATORY_CARE_PROVIDER_SITE_OTHER): Payer: Self-pay | Admitting: Family

## 2017-04-20 ENCOUNTER — Ambulatory Visit: Payer: Self-pay | Admitting: Family

## 2017-04-20 ENCOUNTER — Other Ambulatory Visit (INDEPENDENT_AMBULATORY_CARE_PROVIDER_SITE_OTHER): Payer: Self-pay

## 2017-04-20 ENCOUNTER — Encounter: Payer: Self-pay | Admitting: Family

## 2017-04-20 VITALS — BP 146/84 | HR 67 | Temp 98.4°F | Wt 194.0 lb

## 2017-04-20 DIAGNOSIS — E039 Hypothyroidism, unspecified: Secondary | ICD-10-CM

## 2017-04-20 DIAGNOSIS — G4762 Sleep related leg cramps: Secondary | ICD-10-CM

## 2017-04-20 DIAGNOSIS — R350 Frequency of micturition: Secondary | ICD-10-CM

## 2017-04-20 LAB — COMPREHENSIVE METABOLIC PANEL
ALT: 21 U/L (ref 0–35)
AST: 15 U/L (ref 0–37)
Albumin: 4.2 g/dL (ref 3.5–5.2)
Alkaline Phosphatase: 90 U/L (ref 39–117)
BUN: 15 mg/dL (ref 6–23)
CO2: 28 mEq/L (ref 19–32)
Calcium: 9.6 mg/dL (ref 8.4–10.5)
Chloride: 101 mEq/L (ref 96–112)
Creatinine, Ser: 0.63 mg/dL (ref 0.40–1.20)
GFR: 101.41 mL/min (ref >60.00)
GLUCOSE: 112 mg/dL — AB (ref 70–99)
POTASSIUM: 4.2 meq/L (ref 3.5–5.1)
SODIUM: 139 meq/L (ref 135–145)
TOTAL PROTEIN: 7.5 g/dL (ref 6.0–8.3)
Total Bilirubin: 0.6 mg/dL (ref 0.2–1.2)

## 2017-04-20 LAB — POC URINALSYSI DIPSTICK (AUTOMATED)
Bilirubin, UA: NEGATIVE
Glucose, UA: NEGATIVE
Ketones, UA: NEGATIVE
LEUKOCYTES UA: NEGATIVE
NITRITE UA: NEGATIVE
PH UA: 5 (ref 5.0–8.0)
PROTEIN UA: NEGATIVE
Spec Grav, UA: 1.025 (ref 1.010–1.025)
Urobilinogen, UA: 0.2 E.U./dL

## 2017-04-20 LAB — MAGNESIUM: MAGNESIUM: 1.7 mg/dL (ref 1.5–2.5)

## 2017-04-20 LAB — TSH: TSH: 0.54 u[IU]/mL (ref 0.35–4.50)

## 2017-04-20 MED ORDER — SULFAMETHOXAZOLE-TRIMETHOPRIM 800-160 MG PO TABS: 1.0000 | ORAL_TABLET | Freq: Two times a day (BID) | ORAL | 0 refills | Status: DC

## 2017-04-20 NOTE — Progress Notes (Signed)
Katrina [name] is a 62 y.o. female with the following history as recorded in EpicCare:  Patient Active Problem List   Diagnosis Date Noted  . 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 Right calf pain 08/13/2016  . Low back pain associated with a spinal disorder other than radiculopathy or spinal stenosis 03/01/2016  . Gluteal tendinitis of right buttock 01/28/2016  . Urinary frequency 12/08/2015  . Myalgia 10/09/2015  . Upper airway cough syndrome 01/26/2015  . Obesity (BMI 30-39.9) 01/26/2015  . Pulmonary nodules   . DOE (dyspnea on exertion) 01/01/2015  . Pulmonary sarcoidosis ([place])   . Mediastinal lymphadenopathy 12/26/2014  . Esophageal ring, acquired 11/10/2012  . Dysphagia, unspecified(787.20) 11/09/2012  . Chest pain 11/08/2012  . Hypothyroidism 11/08/2012  . 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 Low back pain 02/21/2008  . Hypothyroidism, acquired 12/28/2007  . GERD 12/28/2007  . 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 HEADACHE 12/28/2007    Current Outpatient Medications  Medication Sig Dispense Refill  . levothyroxine (SYNTHROID) 137 MCG tablet take 1 tablet by mouth every morning ON AN EMPTY STOMACH. 90 tablet 0  . mometasone-formoterol (DULERA) 100-5 MCG/ACT AERO Inhale 2 puffs into the lungs 2 (two) times daily. 1 Inhaler 11  . omeprazole (PRILOSEC) 20 MG capsule Take 20 mg by mouth daily.    Marland Kitchen PROAIR HFA 108 (90 Base) MCG/ACT inhaler inhale 2 puffs if needed every 4 hours 8.5 g 3  . sulfamethoxazole-trimethoprim (BACTRIM DS,SEPTRA DS) 800-160 MG tablet Take 1 tablet by mouth 2 (two) times daily. 10 tablet 0   No current facility-administered medications for this visit.     Allergies: Penicillins  Past Medical History:  Diagnosis Date  . Chest pain at rest 11/07/2012  . Eosinophilic esophagitis 30/86/5784  . GERD (gastroesophageal reflux disease)   . Hypothyroidism   . Sarcoidosis   . Shortness of breath 11/08/2012    RECENTLY  & WHEN i EXERCISE"  . Thyroid disease     Past Surgical History:  Procedure Laterality Date  . ABDOMINAL HYSTERECTOMY     Partial  . ESOPHAGOGASTRODUODENOSCOPY (EGD) WITH ESOPHAGEAL DILATION N/A 11/10/2012   Procedure: ESOPHAGOGASTRODUODENOSCOPY (EGD) WITH ESOPHAGEAL DILATION;  Surgeon: Gatha Mayer, MD;  Location: [facility];  Service: Endoscopy;  Laterality: N/A;  Maloney vs. Balloon  . VIDEO BRONCHOSCOPY WITH ENDOBRONCHIAL ULTRASOUND Bilateral 01/15/2015   Procedure: VIDEO BRONCHOSCOPY WITH ENDOBRONCHIAL ULTRASOUND of  LEFT LUNG LOWER LOBE and right lung;  Surgeon: Collene Gobble, MD;  [facility];  Service: Thoracic;  Laterality: Bilateral;    Family History  Problem Relation Age of Onset  . Diabetes Mother   . Stroke Mother   . Coronary artery disease Father   . Coronary artery disease Brother   . Hypothyroidism Maternal Grandmother   . Diabetes Maternal Grandfather   . Heart disease Paternal Grandmother   . Heart disease Paternal Grandfather     Social History   Tobacco Use  . Smoking status: Never Smoker  . Smokeless tobacco: Never Used  Substance Use Topics  . Alcohol use: No    Subjective:  5 day history of urinary urgency/ frequency; has tried taking OTC Azo with little relief; is prone to UTIs; does feel some lower bladder discomfort and low back pain;  symptoms seemed to start after long drive to [place]- sat for 8 hours coming back;  Of note, saw her GYN with spotting in August 2018- exam was unremarkable; pelvic ultrasound  was not done but some type of internal exam was done; patient is s/p hysterectomy and GYN noted that ovaries were fine.   Also concerned about leg cramps and pain at night; notes that pain is affecting her sleep- has to get up and walk around;   Notes that she has been more tired than usual lately- does take her thyroid medication as prescribed; last TSH was 8.39 when checked in October; does not have documented hypertension.    Objective:  Vitals:   04/20/17 0940  BP: (!) 146/84  Pulse: 67  Temp: 98.4 F (36.9 C)  SpO2: 98%  Weight: 194 lb (88 kg)    General: Well developed, well nourished, in no acute distress  Skin : Warm and dry.  Lungs: Respirations unlabored; clear to auscultation bilaterally without wheeze, rales, rhonchi  CVS exam: normal rate and regular rhythm.  Neurologic: Alert and oriented; speech intact; face symmetrical; moves all extremities well; CNII-XII intact without focal deficit  Assessment:  1. Urine frequency   2. Nocturnal leg cramps   3. Hypothyroidism, unspecified type     Plan:  1. Check U/A and urine culture; Rx for Bactrim DS bid x 5 days; follow-up to be determined based on culture; 2. Check CMP, magnesium today; 3. Check TSH- will adjust as needed;  Patient is also encouraged to start checking blood pressure regularly and keep log; if consistently 140/90, please let us know;    No Follow-up on file.  Orders Placed This Encounter  Procedures  . CULTURE, URINE COMPREHENSIVE    Standing Status:   Future    Number of Occurrences:   1    Standing Expiration Date:   04/20/2018  . Comprehensive metabolic panel    Standing Status:   Future    Number of Occurrences:   1    Standing Expiration Date:   04/20/2018  . Magnesium    Standing Status:   Future    Number of Occurrences:   1    Standing Expiration Date:   04/20/2018  . TSH    Standing Status:   Future    Number of Occurrences:   1    Standing Expiration Date:   04/20/2018  . POCT Urinalysis Dipstick (Automated)    Requested Prescriptions   Signed Prescriptions Disp Refills  . sulfamethoxazole-trimethoprim (BACTRIM DS,SEPTRA DS) 800-160 MG tablet 10 tablet 0    Sig: Take 1 tablet by mouth 2 (two) times daily.

## 2017-04-20 NOTE — Patient Instructions (Signed)
Please start checking your blood pressure regularly and keep log; if consistently 140/90, please let us know;

## 2017-04-21 LAB — CULTURE, URINE COMPREHENSIVE
MICRO NUMBER:: 90003193
SPECIMEN QUALITY:: ADEQUATE

## 2017-04-22 ENCOUNTER — Other Ambulatory Visit: Payer: Self-pay | Admitting: Family

## 2017-04-22 ENCOUNTER — Ambulatory Visit: Payer: Self-pay

## 2017-04-22 MED ORDER — NITROFURANTOIN MONOHYD MACRO 100 MG PO CAPS: 100.0000 mg | ORAL_CAPSULE | Freq: Two times a day (BID) | ORAL | 0 refills | Status: DC

## 2017-04-22 NOTE — Telephone Encounter (Signed)
   Reason for Disposition . Taking new prescription antibiotic (EXCEPTION: finished taking new prescription antibiotic)  Answer Assessment - Initial Assessment Questions 1. APPEARANCE of RASH: "Describe the rash." (e.g., spots, blisters, raised areas, skin peeling, scaly)     Was bright red on upper body and face 2. SIZE: "How big are the spots?" (e.g., tip of pen, eraser, coin; inches, centimeters)     No spots 3. LOCATION: "Where is the rash located?"     Upper body and face 4. COLOR: "What color is the rash?" (Note: It is difficult to assess rash color in people with darker-colored skin. When this situation occurs, simply ask the caller to describe what they see.)     Bright red 5. ONSET: "When did the rash begin?"     Yesterday after her third dose of Bactrim 6. FEVER: "Do you have a fever?" If so, ask: "What is your temperature, how was it measured, and when did it start?"     No 7. ITCHING: "Does the rash itch?" If so, ask: "How bad is the itch?" (Scale 1-10; or mild, moderate, severe)     No itching 8. CAUSE: "What do you think is causing the rash?"     Bactrim 9. NEW MEDICATION: "What new medication are you taking?" (e.g., name of antibiotic) "When did you start taking this medication?".     Bactrim 10. OTHER SYMPTOMS: "Do you have any other symptoms?" (e.g., sore throat, fever, joint pain)       No 11. PREGNANCY: "Is there any chance you are pregnant?" "When was your last menstrual period?"       No  Protocols used: RASH - WIDESPREAD ON DRUGS-A-AH

## 2017-04-25 NOTE — Telephone Encounter (Signed)
RN triage routing note:   Pt. Reports she had reaction to Bactrim - after her third dose, her upper body and face became bright red and hot, "like a sunburn." When she got up this morning, all the redness was gone. Request a different antibiotic be called in. Call back number for pt. [phone].Thanks. (Routing comment)

## 2017-04-25 NOTE — Telephone Encounter (Signed)
Called pt and she stated that has received another abx now and has already started it.

## 2017-05-24 ENCOUNTER — Other Ambulatory Visit: Payer: Self-pay | Admitting: *Deleted

## 2017-05-24 MED ORDER — LEVOTHYROXINE SODIUM 137 MCG PO TABS: ORAL_TABLET | ORAL | 1 refills | Status: DC

## 2017-09-07 ENCOUNTER — Emergency Department (HOSPITAL_COMMUNITY)
Admission: EM | Admit: 2017-09-07 | Discharge: 2017-09-07 | Discharge status: Against Medical Advice | Payer: Self-pay | Attending: Emergency Medicine | Admitting: Emergency Medicine

## 2017-09-07 ENCOUNTER — Other Ambulatory Visit: Payer: Self-pay | Admitting: Family

## 2017-09-07 ENCOUNTER — Emergency Department (HOSPITAL_COMMUNITY): Payer: Self-pay

## 2017-09-07 ENCOUNTER — Ambulatory Visit: Payer: Self-pay

## 2017-09-07 ENCOUNTER — Other Ambulatory Visit: Payer: Self-pay

## 2017-09-07 ENCOUNTER — Encounter (HOSPITAL_COMMUNITY): Payer: Self-pay

## 2017-09-07 DIAGNOSIS — Z5321 Procedure and treatment not carried out due to patient leaving prior to being seen by health care provider: Secondary | ICD-10-CM | POA: Insufficient documentation

## 2017-09-07 DIAGNOSIS — R002 Palpitations: Secondary | ICD-10-CM | POA: Insufficient documentation

## 2017-09-07 LAB — I-STAT TROPONIN, ED: TROPONIN I, POC: 0 ng/mL (ref 0.00–0.08)

## 2017-09-07 LAB — BASIC METABOLIC PANEL
ANION GAP: 10 (ref 5–15)
BUN: 13 mg/dL (ref 6–20)
CHLORIDE: 104 mmol/L (ref 101–111)
CO2: 25 mmol/L (ref 22–32)
Calcium: 9.5 mg/dL (ref 8.9–10.3)
Creatinine, Ser: 0.75 mg/dL (ref 0.44–1.00)
GFR calc non Af Amer: >60 mL/min (ref >60)
Glucose, Bld: 128 mg/dL — ABNORMAL HIGH (ref 65–99)
Potassium: 3.9 mmol/L (ref 3.5–5.1)
Sodium: 139 mmol/L (ref 135–145)

## 2017-09-07 LAB — CBC
HCT: 43.7 % (ref 36.0–46.0)
HEMOGLOBIN: 14.4 g/dL (ref 12.0–15.0)
MCH: 27.3 pg (ref 26.0–34.0)
MCHC: 33 g/dL (ref 30.0–36.0)
MCV: 82.9 fL (ref 78.0–100.0)
Platelets: 286 10*3/uL (ref 150–400)
RBC: 5.27 MIL/uL — AB (ref 3.87–5.11)
RDW: 13.2 % (ref 11.5–15.5)
WBC: 8.6 10*3/uL (ref 4.0–10.5)

## 2017-09-07 NOTE — Telephone Encounter (Signed)
Tried to call patient but she has not set up her VM for me to leave message. I will created CRM incase she sees number and calls back. In the meantime were you going to proceed with sending in her refill?

## 2017-09-07 NOTE — ED Triage Notes (Signed)
Pt endorses palpitations intermittently x 3 weeks. Denies CP but endorses chronic shob. VSS.

## 2017-09-07 NOTE — ED Notes (Signed)
Pt came to front desk and informed this RN that she was leaving. Pt encouraged to stay but did not.

## 2017-09-07 NOTE — Telephone Encounter (Signed)
Pt calling with c/o heart palpitations and feeling like her heart is "flipping over."Pt stated that her heart has been feeling this way for 2 weeks. She stated that she has been feeling dizziness and her BP elevated. She took her BP [facility] a local [facility] and it was 168/84.  She stated that she has been having left shoulder pain and left jaw pain x 1 week. She stated that her face is hot and red and that she can see her heart beat vibration on her chest. HR 84 per minute during the call. Pt stated that she thought is was from anxiety.  With her sx, advised her to go to the ED at [facility] ED now. Pt is alone and will have to call for a ride to the hospital. Offered to call EMS for pt but she refused.  Reason for Disposition . Dizziness, lightheadedness, or weakness  Answer Assessment - Initial Assessment Questions 1. DESCRIPTION: "Please describe your heart rate or heart beat that you are having" (e.g., fast/slow, regular/irregular, skipped or extra beats, "palpitations")     Feels like heart is beating harde and faster can see the vibration through the skin- has felt like it has flipped 2. ONSET: "When did it start?" (Minutes, hours or days)    2 weeks ago was waking her up at night and then it was coming back late afternoon 3. DURATION: "How long does it last" (e.g., seconds, minutes, hours)     20 to 30 minutes Yesterday it lasted for an hour 4. PATTERN "Does it come and go, or has it been constant since it started?"  "Does it get worse with exertion?"   "Are you feeling it now?"     Comes and goes- it gets worse with exertion- moved a table and after that she felt worse- pt is feeling it now but is not as bad as it was yesterday 5. TAP: "Using your hand, can you tap out what you are feeling on a chair or table in front of you, so that I can hear?" (Note: not all patients can do this)      Irregular  6. HEART RATE: "Can you tell me your heart rate?" "How many beats in 15 seconds?"  (Note: not all patients can  do this)       21  7. RECURRENT SYMPTOM: "Have you ever had this before?" If so, ask: "When was the last time?" and "What happened that time?"     Yes-Dr told her it could be heart or anxiety about 6 months ago 8. CAUSE: "What do you think is causing the palpitations?"     anxiety 9. CARDIAC HISTORY: "Do you have any history of heart disease?" (e.g., heart attack, angina, bypass surgery, angioplasty, arrhythmia)      no 10. OTHER SYMPTOMS: "Do you have any other symptoms?" (e.g., dizziness, chest pain, sweating, difficulty breathing) High blood pressure, face hot and red,  dizziness 11. PREGNANCY: "Is there any chance you are pregnant?" "When was your last menstrual period?"       n/a  Protocols used: HEART RATE AND HEARTBEAT QUESTIONS-A-AH

## 2017-09-07 NOTE — Telephone Encounter (Signed)
I am sending in refill on her Synthroid x 3 months; is she okay with me being listed as her PCP? Does she plan to establish with another provider?

## 2017-09-08 NOTE — Telephone Encounter (Signed)
Sending message as an Pharmacist, hospital. I called her yesterday and left message regarding the refill and if she was going to establish Primary Care here with you or someone else. Guessing since [facility] routed message to our office that she has chosen to stay with [place].  Please let me know if you need me to reach out to her and check on her at some point.

## 2017-09-27 ENCOUNTER — Ambulatory Visit (INDEPENDENT_AMBULATORY_CARE_PROVIDER_SITE_OTHER): Payer: Self-pay | Admitting: Nurse Practitioner

## 2017-09-27 ENCOUNTER — Encounter: Payer: Self-pay | Admitting: Nurse Practitioner

## 2017-09-27 ENCOUNTER — Other Ambulatory Visit (INDEPENDENT_AMBULATORY_CARE_PROVIDER_SITE_OTHER): Payer: Self-pay

## 2017-09-27 VITALS — BP 142/92 | HR 58 | Temp 98.1°F | Resp 16 | Ht 62.0 in | Wt 196.1 lb

## 2017-09-27 DIAGNOSIS — I10 Essential (primary) hypertension: Secondary | ICD-10-CM

## 2017-09-27 DIAGNOSIS — K219 Gastro-esophageal reflux disease without esophagitis: Secondary | ICD-10-CM

## 2017-09-27 DIAGNOSIS — R7309 Other abnormal glucose: Secondary | ICD-10-CM

## 2017-09-27 DIAGNOSIS — E038 Other specified hypothyroidism: Secondary | ICD-10-CM

## 2017-09-27 HISTORY — DX: Essential (primary) hypertension: I10

## 2017-09-27 LAB — HEMOGLOBIN A1C: HEMOGLOBIN A1C: 6.5 % (ref 4.6–6.5)

## 2017-09-27 LAB — MAGNESIUM: Magnesium: 1.9 mg/dL (ref 1.5–2.5)

## 2017-09-27 MED ORDER — LISINOPRIL 10 MG PO TABS: 10.0000 mg | ORAL_TABLET | Freq: Every day | ORAL | 1 refills | Status: DC

## 2017-09-27 NOTE — Assessment & Plan Note (Addendum)
She continues to have GERD symptoms when she stops prilosec so encouraged her to continue daily prilosec Discussed long term risks of uncontrolled GERD and that risks of uncontrolled GERD likely greater than risks of adverse medication reactions Will update magnesium level today - Magnesium; Future

## 2017-09-27 NOTE — Assessment & Plan Note (Addendum)
On chart review, it appears that her blood pressure has been high for longer than one month, even back to last year I have recommended starting daily antihypertensive agent to reduce blood pressure and see if her symptoms today are related to uncontrolled HTN, she is agreeable to start lisinopril today, dosing and side effects were discussed Encourage to continue monitoring BP readings at home RTC in 1 month for F/U - lisinopril (PRINIVIL,ZESTRIL) 10 MG tablet; Take 1 tablet (10 mg total) by mouth daily.  Dispense: 30 tablet; Refill: 1

## 2017-09-27 NOTE — Patient Instructions (Signed)
Please head downstairs for lab work/x-rays. If any of your test results are critically abnormal, you will be contacted right away. Your results may be released to your MyChart for viewing before I am able to provide you with my response. I will contact you within a week about your test results and any recommendations for abnormalities.  Please start lisinopril 10mg  daily for your blood pressure. Please try to check your blood pressure once daily or at least a few times a week, at the same time each day, and keep a log. Our goal is to get your blood pressure under 140/90 consistently.  Please return in about 2-3 weeks for a follow up visit

## 2017-09-27 NOTE — Assessment & Plan Note (Signed)
Lab Results  Component Value Date   TSH 0.54 04/20/2017   TSH up to date Continue synthroid at current dosage

## 2017-09-27 NOTE — Progress Notes (Signed)
Name: Katrina [name]   MRN: [id number]    DOB: 22-May-1955   Date:09/27/2017       Progress Note  Subjective  Chief Complaint  Chief Complaint  Patient presents with  . Hypertension    has been having high BP readings for a couple of weeks    HPI Katrina [name] is here today for acute complaint of elevated blood pressure readings, flushing and palpitations intermittenlty for a little over a month, she initially went to the ED on 09/07/17 when she first developed these symptoms. but left prior to medical evaluation due to wait time. She did have initial workup in the ED prior to leaving on 5/22 which included: Normal EKG reading normal sinus rhythm CBC, BMET, I-stat troponin that were normal aside from elevated glucose but she was not fasting, CXR showing mild cardiomegaly.  She has continued to have elevated BP, flushing, and palpitations over the past month and is here today for evaluation.   She says that her BP readings seem to directly related to her prilosec, BP seems to have gone up after she stopped taking her prilosec last month after reading about bad side effects of prilosec online. However, she resumed her prilosec last week and her BP readings started to trend back down, with a reading of 140s/80s when waking this am. She says she was actually started on prilosec by pulmonology for shortness of breath, not heartburn She has also noticed feeling flushed at night after eating, reports she has been intermittently fasting lately to try to control her blood sugar, she was told it was high in the past She has not been maintained on any blood pressure medications in the past. She denies syncope, confusion,  vision changes, chest pain, shortness of breath, edema. She does experience occasional mild headaches and heartburn, heartburn occurs when not taking the prilosec She also tells me that she has been taking advil recently due to foot pain.  BP Readings from Last 3 Encounters:  09/27/17 (!)  142/92  09/07/17 (!) 153/80  04/20/17 (!) 146/84     Patient Active Problem List   Diagnosis Date Noted  . 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 Right calf pain 08/13/2016  . Low back pain associated with a spinal disorder other than radiculopathy or spinal stenosis 03/01/2016  . Gluteal tendinitis of right buttock 01/28/2016  . Urinary frequency 12/08/2015  . Myalgia 10/09/2015  . Upper airway cough syndrome 01/26/2015  . Obesity (BMI 30-39.9) 01/26/2015  . Pulmonary nodules   . DOE (dyspnea on exertion) 01/01/2015  . Pulmonary sarcoidosis ([place])   . Mediastinal lymphadenopathy 12/26/2014  . Esophageal ring, acquired 11/10/2012  . Dysphagia, unspecified(787.20) 11/09/2012  . Chest pain 11/08/2012  . Hypothyroidism 11/08/2012  . 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 Low back pain 02/21/2008  . Hypothyroidism, acquired 12/28/2007  . GERD 12/28/2007  . 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 HEADACHE 12/28/2007    Past Surgical History:  Procedure Laterality Date  . ABDOMINAL HYSTERECTOMY     Partial  . ESOPHAGOGASTRODUODENOSCOPY (EGD) WITH ESOPHAGEAL DILATION N/A 11/10/2012   Procedure: ESOPHAGOGASTRODUODENOSCOPY (EGD) WITH ESOPHAGEAL DILATION;  Surgeon: Gatha Mayer, MD;  Location: [place];  Service: Endoscopy;  Laterality: N/A;  [place] vs. Balloon  . VIDEO BRONCHOSCOPY WITH ENDOBRONCHIAL ULTRASOUND Bilateral 01/15/2015   Procedure: VIDEO BRONCHOSCOPY WITH ENDOBRONCHIAL ULTRASOUND of  LEFT LUNG LOWER LOBE and right lung;  Surgeon: Collene Gobble, MD;  Location: [facility]  OR;  Service: Thoracic;  Laterality: Bilateral;    Family History  Problem Relation Age of Onset  . Diabetes Mother   . Stroke Mother   . Coronary artery disease Father   . Coronary artery disease Brother   . Hypothyroidism Maternal Grandmother   . Diabetes Maternal Grandfather   . Heart disease Paternal Grandmother   . Heart disease Paternal Grandfather      Social History   Socioeconomic History  . Marital status: Divorced    Spouse name: Not on file  . Number of children: 3  . Years of education: 70  . Highest education level: Not on file  Occupational History  . Occupation: Unemployed  Social Needs  . Financial resource strain: Not on file  . Food insecurity:    Worry: Not on file    Inability: Not on file  . Transportation needs:    Medical: Not on file    Non-medical: Not on file  Tobacco Use  . Smoking status: Never Smoker  . Smokeless tobacco: Never Used  Substance and Sexual Activity  . Alcohol use: No  . Drug use: No  . Sexual activity: Never  Lifestyle  . Physical activity:    Days per week: Not on file    Minutes per session: Not on file  . Stress: Not on file  Relationships  . Social connections:    Talks on phone: Not on file    Gets together: Not on file    Attends religious service: Not on file    Active member of club or organization: Not on file    Attends meetings of clubs or organizations: Not on file    Relationship status: Not on file  . Intimate partner violence:    Fear of current or ex partner: Not on file    Emotionally abused: Not on file    Physically abused: Not on file    Forced sexual activity: Not on file  Other Topics Concern  . Not on file  Social History Narrative   Fun: Anything and everything     Current Outpatient Medications:  .  levothyroxine (SYNTHROID, LEVOTHROID) 137 MCG tablet, TAKE 1 TABLET BY MOUTH EVERY MORNING ON AN EMPTY STOMACH, Disp: 90 tablet, Rfl: 0 .  mometasone-formoterol (DULERA) 100-5 MCG/ACT AERO, Inhale 2 puffs into the lungs 2 (two) times daily., Disp: 1 Inhaler, Rfl: 11 .  omeprazole (PRILOSEC) 20 MG capsule, Take 20 mg by mouth daily., Disp: , Rfl:  .  PROAIR HFA 108 (90 Base) MCG/ACT inhaler, inhale 2 puffs if needed every 4 hours, Disp: 8.5 g, Rfl: 3  Allergies  Allergen Reactions  . Penicillins Shortness Of Breath and Rash  . Bactrim Ds  [Sulfamethoxazole-Trimethoprim] Rash    Upper body and Face redness with heat.       ROS See HPI  Objective  Vitals:   09/27/17 1049  BP: (!) 142/92  Pulse: (!) 58  Resp: 16  Temp: 98.1 F (36.7 C)  TempSrc: Oral  SpO2: 96%  Weight: 196 lb 1.9 oz (89 kg)  Height: 5\' 2"  (1.575 m)    Body mass index is 35.87 kg/m.  Physical Exam Vital signs reviewed Constitutional: Patient appears well-developed and well-nourished. No distress.  HENT: Head: Normocephalic and atraumatic. Nose: Nose normal. Mouth/Throat: Oropharynx is clear and moist. No oropharyngeal exudate.  Eyes: Conjunctivae and EOM are normal. Pupils are equal, round, and reactive to light. No scleral icterus.  Neck: Normal range of motion. Neck supple. No  thyromegaly present. Cardiovascular: Normal rate, regular rhythm and normal heart sounds. No BLE edema. Distal pulses intact. Pulmonary/Chest: Effort normal and breath sounds normal. No respiratory distress. Musculoskeletal: Normal range of motion, . No gross deformities Neurological: She is alert and oriented to person, place, and time. No cranial nerve deficit. Coordination, balance, strength, speech and gait are normal.  Skin: Skin is warm and dry. No rash noted. No erythema.  Psychiatric: Patient has a normal mood and affect. behavior is normal. Judgment and thought content normal.  Assessment & Plan RTC in 2-3 weeks for F/U: HTN- starting lisinopril, GERD- resuming prilosec TSH WNL on 04/20/17 CBC, BMET, EKG, CXR done in ED on 09/07/17  Elevated glucose She is requesting diabetes testing, and elevated glucose readings are noted on chart review - Hemoglobin A1c; Future

## 2017-10-11 ENCOUNTER — Encounter: Payer: Self-pay | Admitting: Family

## 2017-10-18 ENCOUNTER — Encounter: Payer: Self-pay | Admitting: Family

## 2017-11-08 ENCOUNTER — Encounter (HOSPITAL_BASED_OUTPATIENT_CLINIC_OR_DEPARTMENT_OTHER): Payer: Self-pay

## 2017-11-08 ENCOUNTER — Emergency Department (HOSPITAL_BASED_OUTPATIENT_CLINIC_OR_DEPARTMENT_OTHER): Payer: Self-pay

## 2017-11-08 ENCOUNTER — Emergency Department (HOSPITAL_BASED_OUTPATIENT_CLINIC_OR_DEPARTMENT_OTHER)
Admission: EM | Admit: 2017-11-08 | Discharge: 2017-11-08 | Disposition: A | Discharge status: Home / Self Care | Payer: Self-pay | Attending: Emergency Medicine | Admitting: Emergency Medicine

## 2017-11-08 ENCOUNTER — Other Ambulatory Visit: Payer: Self-pay

## 2017-11-08 DIAGNOSIS — J45991 Cough variant asthma: Secondary | ICD-10-CM | POA: Insufficient documentation

## 2017-11-08 DIAGNOSIS — I10 Essential (primary) hypertension: Secondary | ICD-10-CM | POA: Insufficient documentation

## 2017-11-08 DIAGNOSIS — E039 Hypothyroidism, unspecified: Secondary | ICD-10-CM | POA: Insufficient documentation

## 2017-11-08 DIAGNOSIS — M79602 Pain in left arm: Secondary | ICD-10-CM | POA: Insufficient documentation

## 2017-11-08 DIAGNOSIS — R0602 Shortness of breath: Secondary | ICD-10-CM | POA: Insufficient documentation

## 2017-11-08 DIAGNOSIS — E119 Type 2 diabetes mellitus without complications: Secondary | ICD-10-CM | POA: Insufficient documentation

## 2017-11-08 HISTORY — DX: Exercise induced bronchospasm: J45.990

## 2017-11-08 HISTORY — DX: Type 2 diabetes mellitus without complications: E11.9

## 2017-11-08 LAB — BASIC METABOLIC PANEL
Anion gap: 10 (ref 5–15)
BUN: 16 mg/dL (ref 8–23)
CHLORIDE: 101 mmol/L (ref 98–111)
CO2: 27 mmol/L (ref 22–32)
CREATININE: 0.74 mg/dL (ref 0.44–1.00)
Calcium: 9.1 mg/dL (ref 8.9–10.3)
GFR calc non Af Amer: >60 mL/min (ref >60)
Glucose, Bld: 115 mg/dL — ABNORMAL HIGH (ref 70–99)
POTASSIUM: 3.8 mmol/L (ref 3.5–5.1)
SODIUM: 138 mmol/L (ref 135–145)

## 2017-11-08 LAB — CBC WITH DIFFERENTIAL/PLATELET
Basophils Absolute: 0 10*3/uL (ref 0.0–0.1)
Basophils Relative: 0 %
EOS ABS: 0.2 10*3/uL (ref 0.0–0.7)
Eosinophils Relative: 2 %
HCT: 44.2 % (ref 36.0–46.0)
HEMOGLOBIN: 15.1 g/dL — AB (ref 12.0–15.0)
LYMPHS ABS: 3.2 10*3/uL (ref 0.7–4.0)
Lymphocytes Relative: 37 %
MCH: 28.5 pg (ref 26.0–34.0)
MCHC: 34.2 g/dL (ref 30.0–36.0)
MCV: 83.4 fL (ref 78.0–100.0)
Monocytes Absolute: 0.6 10*3/uL (ref 0.1–1.0)
Monocytes Relative: 6 %
NEUTROS PCT: 55 %
Neutro Abs: 4.8 10*3/uL (ref 1.7–7.7)
Platelets: 243 10*3/uL (ref 150–400)
RBC: 5.3 MIL/uL — AB (ref 3.87–5.11)
RDW: 13.3 % (ref 11.5–15.5)
WBC: 8.8 10*3/uL (ref 4.0–10.5)

## 2017-11-08 LAB — TROPONIN I

## 2017-11-08 MED ORDER — ALBUTEROL SULFATE (2.5 MG/3ML) 0.083% IN NEBU: 5.0000 mg | INHALATION_SOLUTION | Freq: Once | RESPIRATORY_TRACT | Status: DC

## 2017-11-08 MED ORDER — ALBUTEROL SULFATE HFA 108 (90 BASE) MCG/ACT IN AERS
2.0000 | INHALATION_SPRAY | RESPIRATORY_TRACT | Status: DC
  Administered 2017-11-08: 2 via RESPIRATORY_TRACT
  Filled 2017-11-08: qty 6.7

## 2017-11-08 MED ORDER — ALBUTEROL SULFATE HFA 108 (90 BASE) MCG/ACT IN AERS: 1.0000 | INHALATION_SPRAY | Freq: Four times a day (QID) | RESPIRATORY_TRACT | 0 refills | Status: DC | PRN

## 2017-11-08 NOTE — ED Provider Notes (Signed)
Emergency Department Provider Note   I have reviewed the triage vital signs and the nursing notes.   HISTORY  Chief Complaint Shortness of Breath   HPI Katrina [name] is a 62 y.o. female with PMH of DM, HTN, GERD, and exercise-induced asthma resents to the emergency department for evaluation of worsening shortness of breath in the setting of swimming. She does have a history of exercise induced asthma. She has also noted some intermittent left arm pain. Describes pain as moderate and throbbing in nature. No radiation to chest or jaw. Pain has been ongoing for the last month. She lost her albuterol inh and was unable to treat her SOB symptoms at home. No radiation of symptoms or modifying factors.    Past Medical History:  Diagnosis Date  . Chest pain at rest 11/07/2012  . Diabetes mellitus without complication ([place])   . Eosinophilic esophagitis 46/65/9935  . Exercise-induced asthma   . GERD (gastroesophageal reflux disease)   . Hypertension 09/27/2017  . Hypothyroidism   . Sarcoidosis   . Shortness of breath 11/08/2012   RECENTLY  & WHEN i EXERCISE"  . Thyroid disease     Patient Active Problem List   Diagnosis Date Noted  . Hypertension 09/27/2017  . 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 Low back pain associated with a spinal disorder other than radiculopathy or spinal stenosis 03/01/2016  . Myalgia 10/09/2015  . Upper airway cough syndrome 01/26/2015  . Obesity (BMI 30-39.9) 01/26/2015  . Pulmonary nodules   . DOE (dyspnea on exertion) 01/01/2015  . Pulmonary sarcoidosis ([place])   . Mediastinal lymphadenopathy 12/26/2014  . Esophageal ring, acquired 11/10/2012  . Dysphagia, unspecified(787.20) 11/09/2012  . Hypothyroidism 11/08/2012  . 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 Hypothyroidism, acquired 12/28/2007  . GERD 12/28/2007  . 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 ABDOMINAL HYSTERECTOMY     Partial  . ESOPHAGOGASTRODUODENOSCOPY (EGD) WITH ESOPHAGEAL DILATION N/A 11/10/2012   Procedure: ESOPHAGOGASTRODUODENOSCOPY (EGD) WITH ESOPHAGEAL DILATION;  Surgeon: Gatha Mayer, MD;  Location: [place];  Service: Endoscopy;  Laterality: N/A;  [place] vs. Balloon  . VIDEO BRONCHOSCOPY WITH ENDOBRONCHIAL ULTRASOUND Bilateral 01/15/2015   Procedure: VIDEO BRONCHOSCOPY WITH ENDOBRONCHIAL ULTRASOUND of  LEFT LUNG LOWER LOBE and right lung;  Surgeon: Collene Gobble, MD;  Location: [facility] OR;  Service: Thoracic;  Laterality: Bilateral;    Allergies Penicillins and Bactrim ds [sulfamethoxazole-trimethoprim]  Family History  Problem Relation Age of Onset  . Diabetes Mother   . Stroke Mother   . Coronary artery disease Father   . Coronary artery disease Brother   . Hypothyroidism Maternal Grandmother   . Diabetes Maternal Grandfather   . Heart disease Paternal Grandmother   . Heart disease Paternal Grandfather     Social History Social History   Tobacco Use  . Smoking status: Never Smoker  . Smokeless tobacco: Never Used  Substance Use Topics  . Alcohol use: No  . Drug use: No    Review of Systems  Constitutional: No fever/chills Eyes: No visual changes. ENT: No sore throat. Cardiovascular: Denies chest pain. Respiratory: Positive shortness of breath. Gastrointestinal: No abdominal pain.  No nausea, no vomiting.  No diarrhea.  No constipation. Genitourinary: Negative for dysuria. Musculoskeletal: Negative for back pain. Positive intermittent throbbing pain in the left arm.  Skin: Negative for rash. Neurological: Negative for headaches, focal weakness or numbness.  10-point ROS otherwise negative.  ____________________________________________   PHYSICAL EXAM:  VITAL SIGNS: ED Triage Vitals  Enc Vitals Group     BP 11/08/17 2028 (!) 159/97     Pulse Rate 11/08/17 2028 61     Resp 11/08/17 2028 18     Temp 11/08/17 2028 98.2 F (36.8 C)      Temp Source 11/08/17 2028 Oral     SpO2 11/08/17 2028 95 %     Weight 11/08/17 2028 195 lb (88.5 kg)     Height 11/08/17 2028 5\' 2"  (1.575 m)     Pain Score 11/08/17 2025 7   Constitutional: Alert and oriented. Well appearing and in no acute distress. Eyes: Conjunctivae are normal.  Head: Atraumatic. Nose: No congestion/rhinnorhea. Mouth/Throat: Mucous membranes are moist.  Neck: No stridor.  Cardiovascular: Normal rate, regular rhythm. Good peripheral circulation. Grossly normal heart sounds.   Respiratory: Normal respiratory effort.  No retractions. Lungs CTAB. Gastrointestinal: Soft and nontender. No distention.  Musculoskeletal: No lower extremity tenderness nor edema. No gross deformities of extremities. Neurologic:  Normal speech and language. No gross focal neurologic deficits are appreciated.  Skin:  Skin is warm, dry and intact. No rash noted.  ____________________________________________   LABS (all labs ordered are listed, but only abnormal results are displayed)  Labs Reviewed  BASIC METABOLIC PANEL - Abnormal; Notable for the following components:      Result Value   Glucose, Bld 115 (*)    All other components within normal limits  CBC WITH DIFFERENTIAL/PLATELET - Abnormal; Notable for the following components:   RBC 5.30 (*)    Hemoglobin 15.1 (*)    All other components within normal limits  TROPONIN I   ____________________________________________  EKG   EKG Interpretation  Date/Time:  Tuesday November 08 2017 20:30:25 EDT Ventricular Rate:  58 PR Interval:  178 QRS Duration: 86 QT Interval:  406 QTC Calculation: 398 R Axis:   60 Text Interpretation:  Sinus bradycardia Otherwise normal ECG No STEMI.  Confirmed by Nanda Quinton [phone]) on 11/08/2017 8:37:06 PM Also confirmed by Nanda Quinton [phone]), editor Lynder Parents [phone])  on 11/09/2017 7:35:57 AM       ____________________________________________  RADIOLOGY  Dg Chest 2 View  Result  Date: 11/08/2017 CLINICAL DATA:  Chest pain and shortness of breath EXAM: CHEST - 2 VIEW COMPARISON:  09/07/2017 FINDINGS: Cardiac shadow is stable. The lungs are well aerated bilaterally. No focal infiltrate or sizable effusion is seen. No bony abnormality is noted. IMPRESSION: No active cardiopulmonary disease. Electronically Signed   By: Inez Catalina M.D.   On: 11/08/2017 21:15    ____________________________________________   PROCEDURES  Procedure(s) performed:   Procedures  None ____________________________________________   INITIAL IMPRESSION / ASSESSMENT AND PLAN / ED COURSE  Pertinent labs & imaging results that were available during my care of the patient were reviewed by me and considered in my medical decision making (see chart for details).  Patient with SOB symptoms that seem most consistent with asthma flare. No significant wheezing here but feeling better after inh given in the ED with spacer. Left arm pain appears MSK in nature. Considered atypical ACS with EKG, troponin, and CXR with normal findings. Doubt PE but considered. Plan for outpatient PCP and Cardiology follow up for stress testing.   At this time, I do not feel there is any life-threatening condition present. I have reviewed and discussed all results (EKG, imaging, lab, urine as appropriate), exam findings with patient. I have reviewed  nursing notes and appropriate previous records.  I feel the patient is safe to be discharged home without further emergent workup. Discussed usual and customary return precautions. Patient and family (if present) verbalize understanding and are comfortable with this plan.  Patient will follow-up with their primary care provider. If they do not have a primary care provider, information for follow-up has been provided to them. All questions have been answered.  ____________________________________________  FINAL CLINICAL IMPRESSION(S) / ED DIAGNOSES  Final diagnoses:  SOB  (shortness of breath)  Left arm pain    Note:  This document was prepared using Dragon voice recognition software and may include unintentional dictation errors.  Nanda Quinton, MD Emergency Medicine    [place], [facility], MD 11/09/17 [phone]

## 2017-11-08 NOTE — ED Notes (Signed)
Patient transported to X-ray 

## 2017-11-08 NOTE — Discharge Instructions (Signed)
You were seen in the ED today with difficulty breathing and some left arm pain. Your heart labs are normal. I would like for you to follow up with your PCP and the Cardiologist listed for further evaluation. Return to the ED with any new or worsening symptoms.

## 2017-11-08 NOTE — ED Triage Notes (Addendum)
Pt states she has exercise induced asthma-she swam yesterday and started feeling SOB-was unable to find inhaler-pt NAD-steady gait-pt later added she was having pain to left UE early this am

## 2018-01-27 ENCOUNTER — Encounter: Payer: Self-pay | Admitting: Family

## 2018-01-27 ENCOUNTER — Other Ambulatory Visit (INDEPENDENT_AMBULATORY_CARE_PROVIDER_SITE_OTHER): Payer: Self-pay

## 2018-01-27 ENCOUNTER — Ambulatory Visit (INDEPENDENT_AMBULATORY_CARE_PROVIDER_SITE_OTHER): Payer: Self-pay | Admitting: Family

## 2018-01-27 VITALS — BP 134/88 | HR 73 | Temp 98.1°F | Ht 62.0 in | Wt 197.0 lb

## 2018-01-27 DIAGNOSIS — R5383 Other fatigue: Secondary | ICD-10-CM

## 2018-01-27 DIAGNOSIS — M791 Myalgia, unspecified site: Secondary | ICD-10-CM

## 2018-01-27 DIAGNOSIS — E038 Other specified hypothyroidism: Secondary | ICD-10-CM

## 2018-01-27 DIAGNOSIS — E119 Type 2 diabetes mellitus without complications: Secondary | ICD-10-CM

## 2018-01-27 LAB — CBC WITH DIFFERENTIAL/PLATELET
BASOS ABS: 0.1 10*3/uL (ref 0.0–0.1)
Basophils Relative: 0.8 % (ref 0.0–3.0)
Eosinophils Absolute: 0.2 10*3/uL (ref 0.0–0.7)
Eosinophils Relative: 2.1 % (ref 0.0–5.0)
HCT: 44.2 % (ref 36.0–46.0)
Hemoglobin: 14.7 g/dL (ref 12.0–15.0)
LYMPHS PCT: 25 % (ref 12.0–46.0)
Lymphs Abs: 2 10*3/uL (ref 0.7–4.0)
MCHC: 33.2 g/dL (ref 30.0–36.0)
MCV: 82.8 fl (ref 78.0–100.0)
Monocytes Absolute: 0.5 10*3/uL (ref 0.1–1.0)
Monocytes Relative: 6.1 % (ref 3.0–12.0)
NEUTROS ABS: 5.3 10*3/uL (ref 1.4–7.7)
Neutrophils Relative %: 66 % (ref 43.0–77.0)
PLATELETS: 270 10*3/uL (ref 150.0–400.0)
RBC: 5.34 Mil/uL — ABNORMAL HIGH (ref 3.87–5.11)
RDW: 14.4 % (ref 11.5–15.5)
WBC: 8 10*3/uL (ref 4.0–10.5)

## 2018-01-27 LAB — COMPREHENSIVE METABOLIC PANEL
ALT: 25 U/L (ref 0–35)
AST: 17 U/L (ref 0–37)
Albumin: 4.3 g/dL (ref 3.5–5.2)
Alkaline Phosphatase: 71 U/L (ref 39–117)
BILIRUBIN TOTAL: 0.7 mg/dL (ref 0.2–1.2)
BUN: 16 mg/dL (ref 6–23)
CHLORIDE: 101 meq/L (ref 96–112)
CO2: 30 meq/L (ref 19–32)
Calcium: 9.6 mg/dL (ref 8.4–10.5)
Creatinine, Ser: 0.82 mg/dL (ref 0.40–1.20)
GFR: 74.63 mL/min (ref >60.00)
Glucose, Bld: 88 mg/dL (ref 70–99)
Potassium: 4.1 mEq/L (ref 3.5–5.1)
Sodium: 139 mEq/L (ref 135–145)
Total Protein: 7.6 g/dL (ref 6.0–8.3)

## 2018-01-27 LAB — VITAMIN B12: VITAMIN B 12: 372 pg/mL (ref 211–911)

## 2018-01-27 LAB — VITAMIN D 25 HYDROXY (VIT D DEFICIENCY, FRACTURES): VITD: 32.07 ng/mL (ref 30.00–100.00)

## 2018-01-27 LAB — TSH: TSH: 13.88 u[IU]/mL — ABNORMAL HIGH (ref 0.35–4.50)

## 2018-01-27 LAB — HEMOGLOBIN A1C: HEMOGLOBIN A1C: 6.2 % (ref 4.6–6.5)

## 2018-01-27 NOTE — Progress Notes (Signed)
Katrina [name] is a 62 y.o. female with the following history as recorded in EpicCare:  Patient Active Problem List   Diagnosis Date Noted  . Hypertension 09/27/2017  . 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 Low back pain associated with a spinal disorder other than radiculopathy or spinal stenosis 03/01/2016  . Myalgia 10/09/2015  . Upper airway cough syndrome 01/26/2015  . Obesity (BMI 30-39.9) 01/26/2015  . Pulmonary nodules   . DOE (dyspnea on exertion) 01/01/2015  . Pulmonary sarcoidosis ([place])   . Mediastinal lymphadenopathy 12/26/2014  . Esophageal ring, acquired 11/10/2012  . Dysphagia, unspecified(787.20) 11/09/2012  . Hypothyroidism 11/08/2012  . 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 Hypothyroidism, acquired 12/28/2007  . GERD 12/28/2007  . 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 albuterol (PROVENTIL HFA;VENTOLIN HFA) 108 (90 Base) MCG/ACT inhaler Inhale 1-2 puffs into the lungs every 6 (six) hours as needed for wheezing or shortness of breath. 1 Inhaler 0  . levothyroxine (SYNTHROID, LEVOTHROID) 137 MCG tablet TAKE 1 TABLET BY MOUTH EVERY MORNING ON AN EMPTY STOMACH 90 tablet 0  . mometasone-formoterol (DULERA) 100-5 MCG/ACT AERO Inhale 2 puffs into the lungs 2 (two) times daily. 1 Inhaler 11  . omeprazole (PRILOSEC) 20 MG capsule Take 20 mg by mouth daily.     No current facility-administered medications for this visit.     Allergies: Penicillins and Bactrim ds [sulfamethoxazole-trimethoprim]  Past Medical History:  Diagnosis Date  . Chest pain at rest 11/07/2012  . Diabetes mellitus without complication ([place])   . Eosinophilic esophagitis 97/67/3419  . Exercise-induced asthma   . GERD (gastroesophageal reflux disease)   . Hypertension 09/27/2017  . Hypothyroidism   . Sarcoidosis   . Shortness of breath 11/08/2012   RECENTLY  & WHEN i EXERCISE"  . Thyroid  disease     Past Surgical History:  Procedure Laterality Date  . ABDOMINAL HYSTERECTOMY     Partial  . ESOPHAGOGASTRODUODENOSCOPY (EGD) WITH ESOPHAGEAL DILATION N/A 11/10/2012   Procedure: ESOPHAGOGASTRODUODENOSCOPY (EGD) WITH ESOPHAGEAL DILATION;  Surgeon: Gatha Mayer, MD;  Location: [place];  Service: Endoscopy;  Laterality: N/A;  [place] vs. [place]  . VIDEO BRONCHOSCOPY WITH ENDOBRONCHIAL ULTRASOUND Bilateral 01/15/2015   Procedure: VIDEO BRONCHOSCOPY WITH ENDOBRONCHIAL ULTRASOUND of  LEFT LUNG LOWER LOBE and right lung;  Surgeon: Collene Gobble, MD;  Location: [facility];  Service: Thoracic;  Laterality: Bilateral;    Family History  Problem Relation Age of Onset  . Diabetes Mother   . Stroke Mother   . Coronary artery disease Father   . Coronary artery disease Brother   . Hypothyroidism Maternal Grandmother   . Diabetes Maternal Grandfather   . Heart disease Paternal Grandmother   . Heart disease Paternal Grandfather     Social History   Tobacco Use  . Smoking status: Never Smoker  . Smokeless tobacco: Never Used  Substance Use Topics  . Alcohol use: No    Subjective:  Patient presents today to follow- up on her diagnosis of diabetes in June of this year; Hgba1c was at 6.5; opted against medication- working on diet/ exercise;  Notes that she "just feels very bad" in general. Was started on Lisinopril in June but patient only took for about a week- felt like she was able to control with her diet and exercise. Is very concerned about her blood sugar; notes that she had been trying to walk  regularly but had to stop when she fell; then switched to swimming but realized that swimming "made her feel so bad."  Notes that she has been having problems with urinary frequency "for months." Has been referred to neurology with question of sleep apnea- does not have health insurance- could not afford to do sleep study; admits that she falls asleep well but is waking up 3-4 x per night;   Went to ER in July with SOB- thought to be related to chlorine reaction from the pool; cardiac evaluation was recommended but she again had to defer due to cost.     Objective:  Vitals:   01/27/18 1557  BP: 134/88  Pulse: 73  Temp: 98.1 F (36.7 C)  TempSrc: Oral  SpO2: 96%  Weight: 197 lb 0.6 oz (89.4 kg)  Height: 5' 2" (1.575 m)    General: Well developed, well nourished, in no acute distress  Skin : Warm and dry.  Head: Normocephalic and atraumatic  Eyes: Sclera and conjunctiva clear; pupils round and reactive to light; extraocular movements intact  Ears: External normal; canals clear; tympanic membranes normal  Oropharynx: Pink, supple. No suspicious lesions  Neck: Supple without thyromegaly, adenopathy  Lungs: Respirations unlabored; clear to auscultation bilaterally without wheeze, rales, rhonchi  CVS exam: normal rate and regular rhythm.  Neurologic: Alert and oriented; speech intact; face symmetrical; moves all extremities well; CNII-XII intact without focal deficit   Assessment:  1. Other fatigue   2. Other specified hypothyroidism   3. Diet-controlled type 2 diabetes mellitus ([place])   4. Myalgia     Plan:  Suspect multi-factorial; will update labs today; also discussed need to consider evaluation for sleep apnea and cardiac evaluation; follow-up to be determined.   No follow-ups on file.  Orders Placed This Encounter  Procedures  . CBC w/Diff    Standing Status:   Future    Number of Occurrences:   1    Standing Expiration Date:   01/27/2019  . Comp Met (CMET)    Standing Status:   Future    Number of Occurrences:   1    Standing Expiration Date:   01/27/2019  . TSH    Standing Status:   Future    Number of Occurrences:   1    Standing Expiration Date:   01/27/2019  . HgB A1c    Standing Status:   Future    Number of Occurrences:   1    Standing Expiration Date:   01/27/2019  . B12    Standing Status:   Future    Number of Occurrences:   1     Standing Expiration Date:   01/27/2019  . Vitamin D (25 hydroxy)    Standing Status:   Future    Number of Occurrences:   1    Standing Expiration Date:   01/27/2019    Requested Prescriptions    No prescriptions requested or ordered in this encounter

## 2018-01-31 ENCOUNTER — Other Ambulatory Visit: Payer: Self-pay | Admitting: Family

## 2018-01-31 MED ORDER — LEVOTHYROXINE SODIUM 150 MCG PO TABS: 150.0000 ug | ORAL_TABLET | Freq: Every day | ORAL | 0 refills | Status: DC

## 2018-01-31 NOTE — Telephone Encounter (Signed)
Pt returning call regarding lab results. Pt given results per notes of Rosann Auerbach on 01/31/18. Pt verbalized understanding and states she would like to have the referrals.Pt also wanting to know if increased TSH level could be due to taking 4 Advil and 2 tylenol for approximately for a week before coming in to have labs drawn due to a bad tooth infection. Pt scheduled for f/u appt on 03/03/18. Pt states she does not use MyChart and states she can not remember her password.Unable to document in result note due to result note not being routed to [facility].

## 2018-02-01 ENCOUNTER — Other Ambulatory Visit: Payer: Self-pay | Admitting: Family

## 2018-02-01 ENCOUNTER — Telehealth: Payer: Self-pay

## 2018-02-01 DIAGNOSIS — G4730 Sleep apnea, unspecified: Secondary | ICD-10-CM

## 2018-02-01 DIAGNOSIS — R0602 Shortness of breath: Secondary | ICD-10-CM

## 2018-02-01 NOTE — Telephone Encounter (Signed)
No, the dental infection/ Advil and Tylenol should not have affected her thyroid level. I will update referrals as we discussed.

## 2018-02-01 NOTE — Telephone Encounter (Signed)
Info given to patient. In the meantime she would like to have the referrals done.Patient was wanting to know if increased TSH level could be due to taking 4 Advil and 2 tylenol for approximately for a week before coming in to have labs drawn due to a bad tooth infection.

## 2018-02-01 NOTE — Telephone Encounter (Signed)
Please advise. Thanks.  

## 2018-02-01 NOTE — Telephone Encounter (Signed)
Tried to reach patient to provide info but her voicemail has not been set up yet so unable to leave her a message.  Created CRM incase she calls back for into to be released to her.

## 2018-02-15 ENCOUNTER — Ambulatory Visit (INDEPENDENT_AMBULATORY_CARE_PROVIDER_SITE_OTHER): Payer: Self-pay | Admitting: Nurse Practitioner

## 2018-02-15 ENCOUNTER — Encounter: Payer: Self-pay | Admitting: Nurse Practitioner

## 2018-02-15 ENCOUNTER — Other Ambulatory Visit: Payer: Self-pay

## 2018-02-15 VITALS — BP 112/60 | HR 60 | Ht 62.0 in | Wt 195.0 lb

## 2018-02-15 DIAGNOSIS — R1012 Left upper quadrant pain: Secondary | ICD-10-CM

## 2018-02-15 DIAGNOSIS — R1084 Generalized abdominal pain: Secondary | ICD-10-CM

## 2018-02-15 DIAGNOSIS — R1011 Right upper quadrant pain: Secondary | ICD-10-CM

## 2018-02-15 DIAGNOSIS — R197 Diarrhea, unspecified: Secondary | ICD-10-CM

## 2018-02-15 NOTE — Patient Instructions (Addendum)
If you are age 64 or older, your body mass index should be between 23-30. Your Body mass index is 35.67 kg/m. If this is out of the aforementioned range listed, please consider follow up with your Primary Care Provider.  If you are age 12 or younger, your body mass index should be between 19-25. Your Body mass index is 35.67 kg/m. If this is out of the aformentioned range listed, please consider follow up with your Primary Care Provider.   Your provider has requested that you go to the basement level for lab work before leaving today. Press "B" on the elevator. The lab is located at the first door on the left as you exit the elevator. C Diff  Follow up with me on March 02, 2018 at 11 a.m.  Thank you for choosing me and [facility].   Tye Savoy, NP

## 2018-02-15 NOTE — Progress Notes (Signed)
Chief Complaint:    Abdominal pain and diarrhea  IMPRESSION and PLAN:     1.   62 yo female presenting with acute diarrhea, generalized abdominal discomfort following 5 days of clindamycin first week in October. I am highly suspicious of C. difficile infection.Very low of DDx list would be carcinoid given the complaints of associated flushing -Obtain stool for C. Difficile.  Patient should be able to admit specimen today -Should have results back before the weekend but if not, I am inclined to start empirically on oral vancomycin -I asked her to refrain from using any antidiarrheal agents for the time being.  2. Postprandial upper abdominal discomfort / pressure radiating into chest and upper back. Sx started around same time as diarrhea. Hard to separate this out from #1 right now.  -see what C-diff study shows, treat if positive. If upper GI sx persists then will proceed with further workup.   -She has enough GI symptoms that I would like to have her return in 2-3 weeks to see where we are with everything. Of course she will call / be seen sooner for worsening sx.     3. CRC screening. Never had screening due to cost, wanted FOBT which was done and negative in 2017.   HPI:     Patient is a 62 year old female known to Dr. Carlean Purl, mainly for GERD / esophageal stricture.  She walked into the office today with abdominal pain and diarrhea, I had an opening on my schedule.   Cuca took 5 days of clindamycin in early October (dental). Diarrhea started while taking the antibiotics but hasn't resolved.  She is averaging 5 loose, non-bloody BMs a day. Some nocturnal stooling. Took imodium yesterday but made diarrhea worse. Stool not particularly malodorous. No new medications (other than Clindamycin). No dietary changes. No known sick contacts. No recent travel. She has chronic left lateral abdominal pain (dating back to last year when she was swimming a lot) but the pain has gotten worse  since diarrhea started and also more generalized now. She hasn't checked temp but feels very hot at times. She has mild nausea at times.    Alene describes post-prandial sensation of fullness / pressure originating in upper abdomen and radiating into chest and upper back. This is new, just since all the diarrhea. No heartburn or reflux. On chronic PPI. She has started to feel flushed at times.  Morning complains of significant fatigue. Thyroid med dose increasd a couple of weeks ago, so far no improvement in fatigue. Diarrhea was present prior to dosage change.  She has chronic, intermittent SOB. Told could be exercise induced asthma but occurs when laying flat and also when not even exerting herself. No lower extremity edema. No hx of heart failure  Review of systems:     Positive for flushing,  no urinary sx.   Past Medical History:  Diagnosis Date  . Chest pain at rest 11/07/2012  . Diabetes mellitus without complication ([place])   . Eosinophilic esophagitis 93/57/0177  . Exercise-induced asthma   . GERD (gastroesophageal reflux disease)   . Hypertension 09/27/2017  . Hypothyroidism   . Sarcoidosis   . Shortness of breath 11/08/2012   RECENTLY  & WHEN i EXERCISE"  . Thyroid disease     Patient's surgical history, family medical history, social history, medications and allergies were all reviewed in Epic   Creatinine clearance cannot be calculated (Unknown ideal weight.)  Current Outpatient Medications  Medication Sig  Dispense Refill  . albuterol (PROVENTIL HFA;VENTOLIN HFA) 108 (90 Base) MCG/ACT inhaler Inhale 1-2 puffs into the lungs every 6 (six) hours as needed for wheezing or shortness of breath. 1 Inhaler 0  . levothyroxine (SYNTHROID, LEVOTHROID) 150 MCG tablet Take 1 tablet (150 mcg total) by mouth daily before breakfast. 90 tablet 0  . mometasone-formoterol (DULERA) 100-5 MCG/ACT AERO Inhale 2 puffs into the lungs 2 (two) times daily. 1 Inhaler 11  . omeprazole (PRILOSEC) 20 MG  capsule Take 20 mg by mouth daily.     No current facility-administered medications for this visit.     Physical Exam:     There were no vitals taken for this visit.  GENERAL:  Pleasant female in NAD PSYCH: : Cooperative, normal affect EENT:  conjunctiva pink, mucous membranes moist, neck supple without masses CARDIAC:  RRR, no murmur heard, no peripheral edema PULM: Normal respiratory effort, lungs CTA bilaterally, no wheezing ABDOMEN:  Nondistended, soft, mild left mid lateral tenderness.  No obvious masses, no hepatomegaly,  normal bowel sounds SKIN:  turgor, no lesions seen Musculoskeletal:  Normal muscle tone, normal strength NEURO: Alert and oriented x 3, no focal neurologic deficits   Tye Savoy , NP 02/15/2018, 11:32 AM

## 2018-02-16 ENCOUNTER — Other Ambulatory Visit: Payer: Self-pay

## 2018-02-16 ENCOUNTER — Telehealth: Payer: Self-pay

## 2018-02-16 DIAGNOSIS — R1084 Generalized abdominal pain: Secondary | ICD-10-CM

## 2018-02-16 DIAGNOSIS — R197 Diarrhea, unspecified: Secondary | ICD-10-CM

## 2018-02-16 LAB — C.DIFFICILE TOXIN: C. Difficile Toxin A: DETECTED — AB

## 2018-02-16 MED ORDER — VANCOMYCIN HCL 125 MG PO CAPS: 125.0000 mg | ORAL_CAPSULE | Freq: Four times a day (QID) | ORAL | 0 refills | Status: DC

## 2018-02-16 NOTE — Telephone Encounter (Signed)
-----   Message from Willia Craze, NP sent at 02/16/2018  9:35 AM EDT ----- Good morning Beth, as I suspect that this patient has C. Difficile.  We please notify her, remind her of hygiene precautions and call in vancomycin 125 mg every 6 hours x14 days.  She needs to call us back with an update after completion of antibiotics or call sooner if getting worse.  Thanks

## 2018-02-16 NOTE — Telephone Encounter (Signed)
Patient is advised. Discussed in detail, self care and nutrition when c-diff positive.  Medications to [facility]. Patient is instructed to call with an update of her symptoms.

## 2018-03-02 ENCOUNTER — Ambulatory Visit (INDEPENDENT_AMBULATORY_CARE_PROVIDER_SITE_OTHER): Payer: Self-pay | Admitting: Nurse Practitioner

## 2018-03-02 ENCOUNTER — Encounter: Payer: Self-pay | Admitting: Nurse Practitioner

## 2018-03-02 VITALS — BP 130/94 | HR 64 | Ht 62.0 in | Wt 195.2 lb

## 2018-03-02 DIAGNOSIS — R109 Unspecified abdominal pain: Secondary | ICD-10-CM

## 2018-03-02 DIAGNOSIS — A0472 Enterocolitis due to Clostridium difficile, not specified as recurrent: Secondary | ICD-10-CM

## 2018-03-02 NOTE — Progress Notes (Signed)
Chief Complaint:    Follow up on C-diff  IMPRESSION and PLAN:    82.  62 year old female with C. difficile diarrhea following course of clindamycin.  She has completed the vancomycin, bowel movement greatly improved but not quite back to normal.  Having approximately 3 bowel movements a day, though most are formed. -Patient will call us back ASAP if she develops recurrent diarrhea.  She understands that antibiotics in the future can increase her risk of C. difficile recurrence  2.  Chronic left mid/lateral abdominal pain, started months ago. Patient is concerned . There seems to be a musculoskeletal component she thinks pain sometimes aggravated by eating, sometimes better after BM.  -We discussed options for further evaluation. Given the persistent nature of the discomfort it is reasonable to proceed with a CTAP with contrast. Normal Cr on 01/27/18.   3. CRC screening. Never had screening due to cost, FOBT negative in 2017.      HPI:     Patient is a 62 year old female, known to Dr. Carlean Purl,  who I saw a couple weeks 2 weeks ago for acute diarrhea and abdominal pain.  She had taken a course of clindamycin a few weeks prior.  Obtained stool for C. difficile and not surprisingly it was positive.  Patient was started on oral vancomycin 125 mg 4 times daily.  She is here for follow-up  Leshonda has completed the vancomycin.  Her bowel movements are much better.  She is averaging 3 stools a day, most are formed.  No fevers.  No nausea or vomiting.  She continues to complain about left lateral abdominal pain which started last summer.  The pain she thinks might get worse with eating but also activity. She thinks the pain may be better with bowel movements.  Pain is not severe rather just nagging discomfort.  No urinary symptoms.  No unusual weight loss.  She is worried and would like to know why this pain persists.   Review of systems:     No chest pain, no SOB, no fevers, no urinary sx    Past Medical History:  Diagnosis Date  . Chest pain at rest 11/07/2012  . Diabetes mellitus without complication ([place])   . Eosinophilic esophagitis 00/92/3300  . Exercise-induced asthma   . GERD (gastroesophageal reflux disease)   . Hypertension 09/27/2017  . Hypothyroidism   . Sarcoidosis   . Shortness of breath 11/08/2012   RECENTLY  & WHEN i EXERCISE"  . Thyroid disease     Patient's surgical history, family medical history, social history, medications and allergies were all reviewed in Epic   Creatinine clearance cannot be calculated (Patient's most recent lab result is older than the maximum 21 days allowed.)  Current Outpatient Medications  Medication Sig Dispense Refill  . albuterol (PROVENTIL HFA;VENTOLIN HFA) 108 (90 Base) MCG/ACT inhaler Inhale 1-2 puffs into the lungs every 6 (six) hours as needed for wheezing or shortness of breath. 1 Inhaler 0  . levothyroxine (SYNTHROID, LEVOTHROID) 150 MCG tablet Take 1 tablet (150 mcg total) by mouth daily before breakfast. 90 tablet 0  . omeprazole (PRILOSEC) 20 MG capsule Take 20 mg by mouth daily.     No current facility-administered medications for this visit.     Physical Exam:     BP (!) 130/94   Pulse 64   Ht 5\' 2"  (1.575 m)   Wt 195 lb 4 oz (88.6 kg)   BMI 35.71 kg/m   GENERAL:  Pleasant female in NAD PSYCH: : Cooperative, normal affect EENT:  conjunctiva pink, mucous membranes moist, neck supple without masses CARDIAC:  RRR,  no peripheral edema PULM: Normal respiratory effort, lungs CTA bilaterally, no wheezing ABDOMEN:  Nondistended, soft, nontender. No obvious masses, no hepatomegaly,  normal bowel sounds SKIN:  turgor, no lesions seen Musculoskeletal:  Normal muscle tone, normal strength NEURO: Alert and oriented x 3, no focal neurologic deficits   Tye Savoy , NP 03/02/2018, 11:10 AM

## 2018-03-02 NOTE — Patient Instructions (Signed)
If you are age 64 or older, your body mass index should be between 23-30. Your Body mass index is 35.71 kg/m. If this is out of the aforementioned range listed, please consider follow up with your Primary Care Provider.  If you are age 38 or younger, your body mass index should be between 19-25. Your Body mass index is 35.71 kg/m. If this is out of the aformentioned range listed, please consider follow up with your Primary Care Provider.   You have been scheduled for a CT scan of the abdomen and pelvis at [facility] ([address]---this is in the same building as Press [facility]).   You are scheduled on 03/10/18 at 9:15 am. You should arrive 15 minutes prior to your appointment time for registration. Please follow the written instructions below on the day of your exam:  WARNING: IF YOU ARE ALLERGIC TO IODINE/X-RAY DYE, PLEASE NOTIFY RADIOLOGY IMMEDIATELY AT [phone]! YOU WILL BE GIVEN A 13 HOUR PREMEDICATION PREP.  1) Do not eat anything after 5:15 am (4 hours prior to your test) 2) You have been given 2 bottles of oral contrast to drink. The solution may taste better if refrigerated, but do NOT add ice or any other liquid to this solution. Shake well before drinking.    Drink 1 bottle of contrast @ 7:15 am (2 hours prior to your exam)  Drink 1 bottle of contrast @ 8:15 am (1 hour prior to your exam)  You may take any medications as prescribed with a small amount of water, if necessary. If you take any of the following medications: METFORMIN, GLUCOPHAGE, GLUCOVANCE, AVANDAMET, RIOMET, FORTAMET, [place] MET, JANUMET, GLUMETZA or METAGLIP, you MAY be asked to HOLD this medication 48 hours AFTER the exam.  The purpose of you drinking the oral contrast is to aid in the visualization of your intestinal tract. The contrast solution may cause some diarrhea. Depending on your individual set of symptoms, you may also receive an intravenous injection of x-ray contrast/dye. Plan on  being at [facility] for 30 minutes or longer, depending on the type of exam you are having performed.  This test typically takes 30-45 minutes to complete.  If you have any questions regarding your exam or if you need to reschedule, you may call the CT department at [phone] between the hours of 8:00 am and 5:00 pm, Monday-Friday.  ________________________________________________________________________  Thank you for choosing me and [facility].   Tye Savoy, NP

## 2018-03-03 ENCOUNTER — Other Ambulatory Visit (INDEPENDENT_AMBULATORY_CARE_PROVIDER_SITE_OTHER): Payer: Self-pay

## 2018-03-03 ENCOUNTER — Other Ambulatory Visit: Payer: Self-pay | Admitting: Family

## 2018-03-03 ENCOUNTER — Encounter: Payer: Self-pay | Admitting: Family

## 2018-03-03 ENCOUNTER — Ambulatory Visit (INDEPENDENT_AMBULATORY_CARE_PROVIDER_SITE_OTHER): Payer: Self-pay | Admitting: Family

## 2018-03-03 VITALS — BP 118/78 | HR 61 | Temp 98.4°F | Ht 62.0 in | Wt 195.0 lb

## 2018-03-03 DIAGNOSIS — E039 Hypothyroidism, unspecified: Secondary | ICD-10-CM

## 2018-03-03 LAB — TSH: TSH: 1.22 u[IU]/mL (ref 0.35–4.50)

## 2018-03-03 MED ORDER — LEVOTHYROXINE SODIUM 150 MCG PO TABS: 150.0000 ug | ORAL_TABLET | Freq: Every day | ORAL | 1 refills | Status: DC

## 2018-03-03 NOTE — Progress Notes (Signed)
Katrina [name] is a 62 y.o. female with the following history as recorded in EpicCare:  Patient Active Problem List   Diagnosis Date Noted  . Hypertension 09/27/2017  . 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 Low back pain associated with a spinal disorder other than radiculopathy or spinal stenosis 03/01/2016  . Myalgia 10/09/2015  . Upper airway cough syndrome 01/26/2015  . Obesity (BMI 30-39.9) 01/26/2015  . Pulmonary nodules   . DOE (dyspnea on exertion) 01/01/2015  . Pulmonary sarcoidosis ([place])   . Mediastinal lymphadenopathy 12/26/2014  . Esophageal ring, acquired 11/10/2012  . Dysphagia, unspecified(787.20) 11/09/2012  . Hypothyroidism 11/08/2012  . 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 Hypothyroidism, acquired 12/28/2007  . GERD 12/28/2007  . 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 albuterol (PROVENTIL HFA;VENTOLIN HFA) 108 (90 Base) MCG/ACT inhaler Inhale 1-2 puffs into the lungs every 6 (six) hours as needed for wheezing or shortness of breath. 1 Inhaler 0  . levothyroxine (SYNTHROID, LEVOTHROID) 150 MCG tablet Take 1 tablet (150 mcg total) by mouth daily before breakfast. 90 tablet 0  . omeprazole (PRILOSEC) 20 MG capsule Take 20 mg by mouth daily.     No current facility-administered medications for this visit.     Allergies: Penicillins and Bactrim ds [sulfamethoxazole-trimethoprim]  Past Medical History:  Diagnosis Date  . Chest pain at rest 11/07/2012  . Diabetes mellitus without complication ([place])   . Eosinophilic esophagitis 84/66/5993  . Exercise-induced asthma   . GERD (gastroesophageal reflux disease)   . Hypertension 09/27/2017  . Hypothyroidism   . Sarcoidosis   . Shortness of breath 11/08/2012   RECENTLY  & WHEN i EXERCISE"  . Thyroid disease     Past Surgical History:  Procedure Laterality Date  . ABDOMINAL HYSTERECTOMY     Partial  .  ESOPHAGOGASTRODUODENOSCOPY (EGD) WITH ESOPHAGEAL DILATION N/A 11/10/2012   Procedure: ESOPHAGOGASTRODUODENOSCOPY (EGD) WITH ESOPHAGEAL DILATION;  Surgeon: Gatha Mayer, MD;  Location: [place];  Service: Endoscopy;  Laterality: N/A;  [place] vs. Balloon  . VIDEO BRONCHOSCOPY WITH ENDOBRONCHIAL ULTRASOUND Bilateral 01/15/2015   Procedure: VIDEO BRONCHOSCOPY WITH ENDOBRONCHIAL ULTRASOUND of  LEFT LUNG LOWER LOBE and right lung;  Surgeon: Collene Gobble, MD;  Location[facility];  Service: Thoracic;  Laterality: Bilateral;    Family History  Problem Relation Age of Onset  . Diabetes Mother   . Stroke Mother   . Coronary artery disease Father   . Coronary artery disease Brother   . Hypothyroidism Maternal Grandmother   . Diabetes Maternal Grandfather   . Heart disease Paternal Grandmother   . Heart disease Paternal Grandfather     Social History   Tobacco Use  . Smoking status: Never Smoker  . Smokeless tobacco: Never Used  Substance Use Topics  . Alcohol use: No    Subjective:  Patient presents for one month follow-up on recent change in Synthroid; notes she does notice an improvement in how has she been feeling; needs to get her labs checked today; has actually had energy to start exercising again;   Has been scheduled for follow-up with neurology to evaluate for sleep apnea; still has not been contacted by cardiology about fatigue on exertion;    Objective:  Vitals:   03/03/18 0912  BP: 118/78  Pulse: 61  Temp: 98.4 F (36.9 C)  TempSrc: Oral  SpO2: 99%  Weight: 195 lb (88.5 kg)  Height: 5\' 2"  (1.575 m)    General: Well developed, well nourished, in no acute distress  Skin : Warm and dry.  Head: Normocephalic and atraumatic  Neck: Supple without thyromegaly, adenopathy  Lungs: Respirations unlabored; clear to auscultation bilaterally without wheeze, rales, rhonchi  CVS exam: normal rate and regular rhythm.  Neurologic: Alert and oriented; speech intact; face  symmetrical; moves all extremities well; CNII-XII intact without focal deficit   Assessment:  1. Hypothyroidism, unspecified type     Plan:  Update TSH today; follow up to be determined.   No follow-ups on file.  Orders Placed This Encounter  Procedures  . TSH    Standing Status:   Future    Standing Expiration Date:   03/03/2019    Requested Prescriptions    No prescriptions requested or ordered in this encounter

## 2018-03-08 ENCOUNTER — Telehealth: Payer: Self-pay | Admitting: Family

## 2018-03-08 NOTE — Telephone Encounter (Signed)
Spoke with patient and info given from Laura's note on 03/03/18.

## 2018-03-08 NOTE — Telephone Encounter (Signed)
Copied from [place] [id number]. Topic: Quick Communication - See Telephone Encounter >> Mar 08, 2018  2:46 PM Cecelia Byars, NT wrote: CRM for notification. See Telephone encounter for: 03/08/18. Patient called and would like a return call with labs from 03/03/18 please advise

## 2018-03-10 ENCOUNTER — Ambulatory Visit (INDEPENDENT_AMBULATORY_CARE_PROVIDER_SITE_OTHER)
Admission: RE | Admit: 2018-03-10 | Discharge: 2018-03-10 | Disposition: A | Discharge status: Home / Self Care | Payer: Self-pay | Source: Ambulatory Visit | Attending: Nurse Practitioner | Admitting: Nurse Practitioner

## 2018-03-10 DIAGNOSIS — R109 Unspecified abdominal pain: Secondary | ICD-10-CM

## 2018-03-10 MED ORDER — IOPAMIDOL (ISOVUE-300) INJECTION 61%
100.0000 mL | Freq: Once | INTRAVENOUS | Status: AC | PRN
  Administered 2018-03-10: 100 mL via INTRAVENOUS

## 2018-04-03 ENCOUNTER — Ambulatory Visit: Payer: Self-pay | Admitting: Internal Medicine

## 2018-04-24 ENCOUNTER — Ambulatory Visit: Payer: Self-pay | Admitting: Internal Medicine

## 2018-04-24 ENCOUNTER — Encounter: Payer: Self-pay | Admitting: Internal Medicine

## 2018-04-24 VITALS — BP 122/64 | HR 68 | Temp 99.0°F | Resp 18 | Ht 62.0 in | Wt 197.0 lb

## 2018-04-24 DIAGNOSIS — J209 Acute bronchitis, unspecified: Secondary | ICD-10-CM

## 2018-04-24 DIAGNOSIS — J4531 Mild persistent asthma with (acute) exacerbation: Secondary | ICD-10-CM

## 2018-04-24 MED ORDER — DOXYCYCLINE HYCLATE 100 MG PO TABS: 100.0000 mg | ORAL_TABLET | Freq: Two times a day (BID) | ORAL | 0 refills | Status: DC

## 2018-04-24 MED ORDER — ALBUTEROL SULFATE HFA 108 (90 BASE) MCG/ACT IN AERS: 1.0000 | INHALATION_SPRAY | Freq: Four times a day (QID) | RESPIRATORY_TRACT | 0 refills | Status: DC | PRN

## 2018-04-24 MED ORDER — PREDNISONE 20 MG PO TABS: 40.0000 mg | ORAL_TABLET | Freq: Every day | ORAL | 0 refills | Status: DC

## 2018-04-24 NOTE — Progress Notes (Signed)
Subjective:    Patient ID: Katrina [name], female    DOB: 04/05/1956, 63 y.o.   MRN: [id number]  HPI She is here for an acute visit for cold symptoms.  She has a history of cough variant asthma or reactive airway disease.  Her symptoms started 4 days ago.   She is experiencing productive cough with orange-brown sputum, sob, wheeze, chills, nasal congestion, right-sided ear pain, sinus pain, sore throat, nausea, body aches and headaches.  She was most concerned about her shortness of breath, wheezing and cough.  She has taken albuterol inhaler, robitussin cough syrup, advil, claritin  Medications and allergies reviewed with patient and updated if appropriate.  Patient Active Problem List   Diagnosis Date Noted  . Hypertension 09/27/2017  . 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 Low back pain associated with a spinal disorder other than radiculopathy or spinal stenosis 03/01/2016  . Myalgia 10/09/2015  . Upper airway cough syndrome 01/26/2015  . Obesity (BMI 30-39.9) 01/26/2015  . Pulmonary nodules   . DOE (dyspnea on exertion) 01/01/2015  . Pulmonary sarcoidosis ([place])   . Mediastinal lymphadenopathy 12/26/2014  . Esophageal ring, acquired 11/10/2012  . Dysphagia, unspecified(787.20) 11/09/2012  . Hypothyroidism 11/08/2012  . 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 Hypothyroidism, acquired 12/28/2007  . GERD 12/28/2007  . 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 levothyroxine (SYNTHROID, LEVOTHROID) 150 MCG tablet Take 1 tablet (150 mcg total) by mouth daily before breakfast. 90 tablet 1  . omeprazole (PRILOSEC) 20 MG capsule Take 20 mg by mouth daily.     No current facility-administered medications on file prior to visit.     Past Medical History:  Diagnosis Date  . Chest pain at rest 11/07/2012  . Diabetes mellitus without complication  ([place])   . Eosinophilic esophagitis 61/44/3154  . Exercise-induced asthma   . GERD (gastroesophageal reflux disease)   . Hypertension 09/27/2017  . Hypothyroidism   . Sarcoidosis   . Shortness of breath 11/08/2012   RECENTLY  & WHEN i EXERCISE"  . Thyroid disease     Past Surgical History:  Procedure Laterality Date  . ABDOMINAL HYSTERECTOMY     Partial  . ESOPHAGOGASTRODUODENOSCOPY (EGD) WITH ESOPHAGEAL DILATION N/A 11/10/2012   Procedure: ESOPHAGOGASTRODUODENOSCOPY (EGD) WITH ESOPHAGEAL DILATION;  Surgeon: Gatha Mayer, MD;  Location: [place];  Service: Endoscopy;  Laterality: N/A;  [place] vs. Balloon  . VIDEO BRONCHOSCOPY WITH ENDOBRONCHIAL ULTRASOUND Bilateral 01/15/2015   Procedure: VIDEO BRONCHOSCOPY WITH ENDOBRONCHIAL ULTRASOUND of  LEFT LUNG LOWER LOBE and right lung;  Surgeon: Collene Gobble, MD;  Location: [facility];  Service: Thoracic;  Laterality: Bilateral;    Social History   Socioeconomic History  . Marital status: Divorced    Spouse name: Not on file  . Number of children: 3  . Years of education: 56  . Highest education level: Not on file  Occupational History  . Occupation: Unemployed  Social Needs  . Financial resource strain: Not on file  . Food insecurity:    Worry: Not on file    Inability: Not on file  . Transportation needs:    Medical: Not on file    Non-medical: Not on file  Tobacco Use  . Smoking status: Never Smoker  . Smokeless tobacco: Never Used  Substance and Sexual Activity  . Alcohol use: No  . Drug use:  No  . Sexual activity: Not on file  Lifestyle  . Physical activity:    Days per week: Not on file    Minutes per session: Not on file  . Stress: Not on file  Relationships  . Social connections:    Talks on phone: Not on file    Gets together: Not on file    Attends religious service: Not on file    Active member of club or organization: Not on file    Attends meetings of clubs or organizations: Not on file    Relationship  status: Not on file  Other Topics Concern  . Not on file  Social History Narrative   Fun: Anything and everything    Family History  Problem Relation Age of Onset  . Diabetes Mother   . Stroke Mother   . Coronary artery disease Father   . Coronary artery disease Brother   . Hypothyroidism Maternal Grandmother   . Diabetes Maternal Grandfather   . Heart disease Paternal Grandmother   . Heart disease Paternal Grandfather     Review of Systems  Constitutional: Positive for chills. Negative for fever.  HENT: Positive for congestion, ear pain (right ear pain and cloggged), sinus pain and sore throat.   Respiratory: Positive for cough (initally dry - now productive), shortness of breath and wheezing.   Gastrointestinal: Positive for nausea.  Musculoskeletal: Positive for myalgias.  Neurological: Positive for headaches.       Objective:   Vitals:   04/24/18 1504  BP: 122/64  Pulse: 68  Resp: 18  Temp: 99 F (37.2 C)  SpO2: 96%   Filed Weights   04/24/18 1504  Weight: 197 lb (89.4 kg)   Body mass index is 36.03 kg/m.  Wt Readings from Last 3 Encounters:  04/24/18 197 lb (89.4 kg)  03/03/18 195 lb (88.5 kg)  03/02/18 195 lb 4 oz (88.6 kg)     Physical Exam GENERAL APPEARANCE: Appears stated age, well appearing, NAD EYES: conjunctiva clear, no icterus HEENT: Left ear canal and tympanic membrane normal, right ear canal normal, slight dullness and erythema to right tympanic membrane, oropharynx with mild erythema, no thyromegaly, trachea midline, no cervical or supraclavicular lymphadenopathy LUNGS: unlabored breathing, good air entry bilaterally, mild wheezing/crackles bilateral bases CARDIOVASCULAR: Normal S1,S2 without murmurs, no edema SKIN: warm, dry        Assessment & Plan:    Acute bronchitis with mild asthma exacerbation: Also evidence of right middle ear effusion with early infection Start doxycycline twice daily x10 days Prednisone 40 mg daily for  5 days Continue albuterol inhaler Continue over-the-counter cold medications/cold syrup Increase rest and fluids Call if no improvement   See Problem List for Assessment and Plan of chronic medical problems.

## 2018-04-24 NOTE — Patient Instructions (Signed)
Take the antibiotic as prescribed - complete the entire course.  Take the steroid as prescribed.    Continue over the counter cold medication, advil and tylenol.  Increase your fluids and rest.    Call if no improvement

## 2018-04-28 ENCOUNTER — Encounter: Payer: Self-pay | Admitting: Nurse Practitioner

## 2018-04-28 ENCOUNTER — Ambulatory Visit (INDEPENDENT_AMBULATORY_CARE_PROVIDER_SITE_OTHER): Payer: Self-pay | Admitting: Nurse Practitioner

## 2018-04-28 VITALS — BP 140/88 | HR 64 | Temp 98.0°F | Ht 62.0 in | Wt 196.0 lb

## 2018-04-28 DIAGNOSIS — J4531 Mild persistent asthma with (acute) exacerbation: Secondary | ICD-10-CM

## 2018-04-28 DIAGNOSIS — J209 Acute bronchitis, unspecified: Secondary | ICD-10-CM

## 2018-04-28 NOTE — Patient Instructions (Addendum)
Start symbicort twice daily  Complete prednisone and doxycycline as instructed.  Please follow up for fevers over 101, if your symptoms get worse, or if your symptoms dont improve after you complete prednisone and doxycyline coures   Acute Bronchitis, Adult Acute bronchitis is when air tubes (bronchi) in the lungs suddenly get swollen. The condition can make it hard to breathe. It can also cause these symptoms:  A cough.  Coughing up clear, yellow, or green mucus.  Wheezing.  Chest congestion.  Shortness of breath.  A fever.  Body aches.  Chills.  A sore throat. Follow these instructions at home:  Medicines  Take over-the-counter and prescription medicines only as told by your doctor.  If you were prescribed an antibiotic medicine, take it as told by your doctor. Do not stop taking the antibiotic even if you start to feel better. General instructions  Rest.  Drink enough fluids to keep your pee (urine) pale yellow.  Avoid smoking and secondhand smoke. If you smoke and you need help quitting, ask your doctor. Quitting will help your lungs heal faster.  Use an inhaler, cool mist vaporizer, or humidifier as told by your doctor.  Keep all follow-up visits as told by your doctor. This is important. How is this prevented? To lower your risk of getting this condition again:  Wash your hands often with soap and water. If you cannot use soap and water, use hand sanitizer.  Avoid contact with people who have cold symptoms.  Try not to touch your hands to your mouth, nose, or eyes.  Make sure to get the flu shot every year. Contact a doctor if:  Your symptoms do not get better in 2 weeks. Get help right away if:  You cough up blood.  You have chest pain.  You have very bad shortness of breath.  You become dehydrated.  You faint (pass out) or keep feeling like you are going to pass out.  You keep throwing up (vomiting).  You have a very bad  headache.  Your fever or chills gets worse. This information is not intended to replace advice given to you by your health care provider. Make sure you discuss any questions you have with your health care provider. Document Released: 09/22/2007 Document Revised: 11/17/2016 Document Reviewed: 09/24/2015 Elsevier Interactive Patient Education  2019 [employer].

## 2018-04-28 NOTE — Progress Notes (Signed)
Katrina [name] is a 63 y.o. female with the following history as recorded in EpicCare:  Patient Active Problem List   Diagnosis Date Noted  . CAP (community acquired pneumonia) 04/30/2018  . Lobar pneumonia ([place])   . Hypertension 09/27/2017  . 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 Low back pain associated with a spinal disorder other than radiculopathy or spinal stenosis 03/01/2016  . Myalgia 10/09/2015  . Upper airway cough syndrome 01/26/2015  . Obesity (BMI 30-39.9) 01/26/2015  . Pulmonary nodules   . DOE (dyspnea on exertion) 01/01/2015  . Pulmonary sarcoidosis ([place])   . Mediastinal lymphadenopathy 12/26/2014  . Esophageal ring, acquired 11/10/2012  . Dysphagia, unspecified(787.20) 11/09/2012  . Hypothyroidism 11/08/2012  . 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 Hypothyroidism, acquired 12/28/2007  . GERD 12/28/2007  . 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    No current outpatient medications on file.   Facility-Administered Medications Ordered in Other Visits  Medication Dose Route Frequency Provider Last Rate Last Dose  . 0.9 %  sodium chloride infusion   Intravenous PRN Little, Wenda Overland, MD 10 mL/hr at 05/01/18 0140 250 mL at 05/01/18 0140  . acetaminophen (TYLENOL) tablet 650 mg  650 mg Oral Q6H PRN Florencia Reasons, MD   650 mg at 04/30/18 1909  . albuterol (PROVENTIL) (2.5 MG/3ML) 0.083% nebulizer solution 2.5 mg  2.5 mg Nebulization Q6H PRN Etta Quill, DO      . enoxaparin (LOVENOX) injection 40 mg  40 mg Subcutaneous Q24H Jennette Kettle M, DO   40 mg at 05/01/18 1104  . guaiFENesin (MUCINEX) 12 hr tablet 600 mg  600 mg Oral BID Florencia Reasons, MD   600 mg at 05/01/18 1105  . hydrALAZINE (APRESOLINE) injection 5-10 mg  5-10 mg Intravenous Q4H PRN Etta Quill, DO      . ipratropium-albuterol (DUONEB) 0.5-2.5 (3) MG/3ML nebulizer solution 3 mL  3 mL Nebulization  TID Florencia Reasons, MD   3 mL at 05/01/18 0920  . levofloxacin (LEVAQUIN) tablet 750 mg  750 mg Oral Q24H Wofford, Drew A, RPH      . levothyroxine (SYNTHROID, LEVOTHROID) tablet 150 mcg  150 mcg Oral QAC breakfast Etta Quill, DO   150 mcg at 05/01/18 0609  . pantoprazole (PROTONIX) EC tablet 40 mg  40 mg Oral Daily Jennette Kettle M, DO   40 mg at 05/01/18 1104  . predniSONE (DELTASONE) tablet 40 mg  40 mg Oral Q breakfast Jennette Kettle M, DO   40 mg at 05/01/18 1104    Allergies: Penicillins and Bactrim ds [sulfamethoxazole-trimethoprim]  Past Medical History:  Diagnosis Date  . Chest pain at rest 11/07/2012  . Diabetes mellitus without complication ([place])   . Eosinophilic esophagitis 83/38/2505  . Exercise-induced asthma   . GERD (gastroesophageal reflux disease)   . Hypertension 09/27/2017  . Hypothyroidism   . Sarcoidosis   . Shortness of breath 11/08/2012   RECENTLY  & WHEN i EXERCISE"  . Thyroid disease     Past Surgical History:  Procedure Laterality Date  . ABDOMINAL HYSTERECTOMY     Partial  . ESOPHAGOGASTRODUODENOSCOPY (EGD) WITH ESOPHAGEAL DILATION N/A 11/10/2012   Procedure: ESOPHAGOGASTRODUODENOSCOPY (EGD) WITH ESOPHAGEAL DILATION;  Surgeon: Gatha Mayer, MD;  Location: [facility];  Service: Endoscopy;  Laterality: N/A;  [place] vs. Balloon  . VIDEO BRONCHOSCOPY WITH ENDOBRONCHIAL ULTRASOUND Bilateral 01/15/2015   Procedure: VIDEO BRONCHOSCOPY WITH  ENDOBRONCHIAL ULTRASOUND of  LEFT LUNG LOWER LOBE and right lung;  Surgeon: Collene Gobble, MD;  [facility];  Service: Thoracic;  Laterality: Bilateral;    Family History  Problem Relation Age of Onset  . Diabetes Mother   . Stroke Mother   . Coronary artery disease Father   . Coronary artery disease Brother   . Hypothyroidism Maternal Grandmother   . Diabetes Maternal Grandfather   . Heart disease Paternal Grandmother   . Heart disease Paternal Grandfather     Social History   Tobacco Use  . Smoking status:  Never Smoker  . Smokeless tobacco: Never Used  Substance Use Topics  . Alcohol use: No     Subjective:  Katrina [name] is here today for evaluation of cough/cold/shortness of breath for approx the past 1 week. She was seen by another provider here on 1/6 and diagnosed with acute bronchitis, asthma exacerbation. She was prescribed doxy and prednisone course, started doxycyline on 1/6 but tried to hold on starting the prednisone at first, however she was not feeling any improvement on the doxycyline alone so she then started the prednisone 2 days ago, now feeling somewhat better, no more body aches or nasal congestion, but continues to feel SOB, especially with exertion. She still has productive cough with sputum which is now clear instead of brown. She says she thinks she is getting better but nervous that shell be worse again once she completes the prednisone course this weekend She denies fevers, chills, syncope, chest pain, abdominal pain, nausea, vomiting. Has also been using her albuterol inhaler PRN   ROS- See HPI   Objective:  Vitals:   04/28/18 1609  BP: 140/88  Pulse: 64  Temp: 98 F (36.7 C)  TempSrc: Oral  SpO2: 95%  Weight: 196 lb (88.9 kg)  Height: 5\' 2"  (1.575 m)    General: Well developed, well nourished, in no acute distress  Skin : Warm and dry.  Head: Normocephalic and atraumatic  Eyes: Sclera and conjunctiva clear; pupils round and reactive to light; extraocular movements intact  Ears: External normal; canals clear; tympanic membranes normal  Oropharynx: Pink, supple. No suspicious lesions  Neck: Supple without thyromegaly, adenopathy  Lungs: Respirations unlabored; without rales, rhonchi; mild wheezing at bases; coarse cough CVS exam: normal rate and regular rhythm, S1 and S2 normal.  Extremities: No edema, cyanosis, clubbing  Vessels: Symmetric bilaterally  Neurologic: Alert and oriented; speech intact; face symmetrical; moves all extremities well; CNII-XII intact  without focal deficit  Psychiatric: Normal mood and affect.  Assessment:  1. Acute bronchitis, unspecified organism   2. Mild persistent asthma with exacerbation     Plan:   VS, PE unremarkable Sounds like she is feeling better overall Continue doxycyline, prednisone course Add symbicort inhaler- sample provided to her today- medication dosing, side effects discussed Home management, red flags and return precautions including when to seek immediate care discussed and printed on AVS She will follow up if symptoms worsen or persist after she completes antibiotic/steroid course  No follow-ups on file.  No orders of the defined types were placed in this encounter.   Requested Prescriptions    No prescriptions requested or ordered in this encounter

## 2018-04-29 ENCOUNTER — Other Ambulatory Visit: Payer: Self-pay

## 2018-04-29 ENCOUNTER — Emergency Department (HOSPITAL_BASED_OUTPATIENT_CLINIC_OR_DEPARTMENT_OTHER): Payer: Self-pay

## 2018-04-29 ENCOUNTER — Encounter (HOSPITAL_BASED_OUTPATIENT_CLINIC_OR_DEPARTMENT_OTHER): Payer: Self-pay | Admitting: *Deleted

## 2018-04-29 ENCOUNTER — Observation Stay (HOSPITAL_BASED_OUTPATIENT_CLINIC_OR_DEPARTMENT_OTHER)
Admission: EM | Admit: 2018-04-29 | Discharge: 2018-05-02 | Disposition: A | Discharge status: Home / Self Care | Payer: Self-pay | Attending: Internal Medicine | Admitting: Internal Medicine

## 2018-04-29 DIAGNOSIS — K222 Esophageal obstruction: Secondary | ICD-10-CM

## 2018-04-29 DIAGNOSIS — J181 Lobar pneumonia, unspecified organism: Principal | ICD-10-CM | POA: Insufficient documentation

## 2018-04-29 DIAGNOSIS — J189 Pneumonia, unspecified organism: Secondary | ICD-10-CM

## 2018-04-29 DIAGNOSIS — E119 Type 2 diabetes mellitus without complications: Secondary | ICD-10-CM | POA: Insufficient documentation

## 2018-04-29 DIAGNOSIS — D860 Sarcoidosis of lung: Secondary | ICD-10-CM | POA: Insufficient documentation

## 2018-04-29 DIAGNOSIS — Z6830 Body mass index (BMI) 30.0-30.9, adult: Secondary | ICD-10-CM | POA: Insufficient documentation

## 2018-04-29 DIAGNOSIS — I10 Essential (primary) hypertension: Secondary | ICD-10-CM | POA: Insufficient documentation

## 2018-04-29 DIAGNOSIS — E669 Obesity, unspecified: Secondary | ICD-10-CM | POA: Insufficient documentation

## 2018-04-29 DIAGNOSIS — E039 Hypothyroidism, unspecified: Secondary | ICD-10-CM | POA: Insufficient documentation

## 2018-04-29 DIAGNOSIS — J9601 Acute respiratory failure with hypoxia: Secondary | ICD-10-CM

## 2018-04-29 DIAGNOSIS — Z79899 Other long term (current) drug therapy: Secondary | ICD-10-CM | POA: Insufficient documentation

## 2018-04-29 DIAGNOSIS — J9621 Acute and chronic respiratory failure with hypoxia: Secondary | ICD-10-CM | POA: Insufficient documentation

## 2018-04-29 LAB — CBC WITH DIFFERENTIAL/PLATELET
Abs Immature Granulocytes: 0.1 10*3/uL — ABNORMAL HIGH (ref 0.00–0.07)
Basophils Absolute: 0 10*3/uL (ref 0.0–0.1)
Basophils Relative: 0 %
Eosinophils Absolute: 0 10*3/uL (ref 0.0–0.5)
Eosinophils Relative: 0 %
HCT: 44.1 % (ref 36.0–46.0)
Hemoglobin: 14 g/dL (ref 12.0–15.0)
Immature Granulocytes: 1 %
Lymphocytes Relative: 42 %
Lymphs Abs: 3.4 10*3/uL (ref 0.7–4.0)
MCH: 26.6 pg (ref 26.0–34.0)
MCHC: 31.7 g/dL (ref 30.0–36.0)
MCV: 83.8 fL (ref 80.0–100.0)
Monocytes Absolute: 0.7 10*3/uL (ref 0.1–1.0)
Monocytes Relative: 9 %
Neutro Abs: 3.8 10*3/uL (ref 1.7–7.7)
Neutrophils Relative %: 48 %
Platelets: 298 10*3/uL (ref 150–400)
RBC: 5.26 MIL/uL — ABNORMAL HIGH (ref 3.87–5.11)
RDW: 12.8 % (ref 11.5–15.5)
WBC: 7.9 10*3/uL (ref 4.0–10.5)
nRBC: 0 % (ref 0.0–0.2)

## 2018-04-29 NOTE — ED Notes (Signed)
ED Provider at bedside. 

## 2018-04-29 NOTE — ED Provider Notes (Signed)
[facility] EMERGENCY DEPARTMENT Provider Note   CSN: [id number] Arrival date & time: 04/29/18  2231   History   Chief Complaint Chief Complaint  Patient presents with  . Shortness of Breath    HPI Katrina [name] is a 63 y.o. female PMH of DM, HTN, GERD, and exercise-induced asthma.  Patient reports that 10 days ago she woke up and experienced a cough and headache.  Reports that the previous day she was around multiple people who had influenza.  Patient reports that she later then developed body aches as well as a couple of episodes of vomiting.  She went to her doctor's office on Monday, 04/24/2018 at which point she was diagnosed with bronchitis.  She was started on doxycycline and prednisone.  Patient reported that her symptoms improved including her cough however she had worsening shortness of breath.  Patient reports that her shortness of breath is only gotten worse and is exacerbated when she exerts herself.  Reports that she came into the emergency room tonight because when she was getting up off of the couch when trying to walk to the bed she was breathing very hard and fast and was unable to make it to the bed.  Patient denies any chest pain.but does report that she has some chest heaviness with exertion.  She does have a history of asthma and has been using her albuterol inhaler given her acute bronchitis.  Patient denies any fevers or chills in the past 48 hours.  Patient denies any leg swelling, orthopnea.     Past Medical History:  Diagnosis Date  . Chest pain at rest 11/07/2012  . Diabetes mellitus without complication ([place])   . Eosinophilic esophagitis 11/91/4782  . Exercise-induced asthma   . GERD (gastroesophageal reflux disease)   . Hypertension 09/27/2017  . Hypothyroidism   . Sarcoidosis   . Shortness of breath 11/08/2012   RECENTLY  & WHEN i EXERCISE"  . Thyroid disease     Patient Active Problem List   Diagnosis Date Noted  . Hypertension 09/27/2017  .  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 Low back pain associated with a spinal disorder other than radiculopathy or spinal stenosis 03/01/2016  . Myalgia 10/09/2015  . Upper airway cough syndrome 01/26/2015  . Obesity (BMI 30-39.9) 01/26/2015  . Pulmonary nodules   . DOE (dyspnea on exertion) 01/01/2015  . Pulmonary sarcoidosis ([place])   . Mediastinal lymphadenopathy 12/26/2014  . Esophageal ring, acquired 11/10/2012  . Dysphagia, unspecified(787.20) 11/09/2012  . Hypothyroidism 11/08/2012  . 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 Hypothyroidism, acquired 12/28/2007  . GERD 12/28/2007  . 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 ABDOMINAL HYSTERECTOMY     Partial  . ESOPHAGOGASTRODUODENOSCOPY (EGD) WITH ESOPHAGEAL DILATION N/A 11/10/2012   Procedure: ESOPHAGOGASTRODUODENOSCOPY (EGD) WITH ESOPHAGEAL DILATION;  Surgeon: Gatha Mayer, MD;  Location: [facility];  Service: Endoscopy;  Laterality: N/A;  [place] vs. Balloon  . VIDEO BRONCHOSCOPY WITH ENDOBRONCHIAL ULTRASOUND Bilateral 01/15/2015   Procedure: VIDEO BRONCHOSCOPY WITH ENDOBRONCHIAL ULTRASOUND of  LEFT LUNG LOWER LOBE and right lung;  Surgeon: Collene Gobble, MD;  Location: [facility];  Service: Thoracic;  Laterality: Bilateral;     OB History   No obstetric history on file.      Home Medications    Prior to Admission medications   Medication Sig Start Date End Date Taking? Authorizing Provider  albuterol (  PROVENTIL HFA;VENTOLIN HFA) 108 (90 Base) MCG/ACT inhaler Inhale 1-2 puffs into the lungs every 6 (six) hours as needed for wheezing or shortness of breath. 04/24/18  Yes Burns, Claudina Lick, MD  doxycycline (VIBRA-TABS) 100 MG tablet Take 1 tablet (100 mg total) by mouth 2 (two) times daily. 04/24/18  Yes Burns, Claudina Lick, MD  levothyroxine (SYNTHROID, LEVOTHROID) 150 MCG tablet Take 1 tablet (150 mcg total) by mouth daily before breakfast.  03/03/18  Yes Marrian Salvage, FNP  omeprazole (PRILOSEC) 20 MG capsule Take 20 mg by mouth daily.   Yes [provider]  predniSONE (DELTASONE) 20 MG tablet Take 2 tablets (40 mg total) by mouth daily with breakfast. 04/24/18  Yes Burns, Claudina Lick, MD    Family History Family History  Problem Relation Age of Onset  . Diabetes Mother   . Stroke Mother   . Coronary artery disease Father   . Coronary artery disease Brother   . Hypothyroidism Maternal Grandmother   . Diabetes Maternal Grandfather   . Heart disease Paternal Grandmother   . Heart disease Paternal Grandfather     Social History Social History   Tobacco Use  . Smoking status: Never Smoker  . Smokeless tobacco: Never Used  Substance Use Topics  . Alcohol use: No  . Drug use: No     Allergies   Penicillins and Bactrim ds [sulfamethoxazole-trimethoprim]   Review of Systems Review of Systems  Constitutional: Negative for chills and fever.  HENT: Positive for congestion and rhinorrhea.   Respiratory: Positive for cough, chest tightness and shortness of breath. Negative for wheezing.   Cardiovascular: Negative for leg swelling.  Gastrointestinal: Positive for diarrhea. Negative for abdominal pain, constipation, nausea and vomiting.  Genitourinary: Negative for dysuria.  Neurological: Positive for headaches. Negative for dizziness.  All other systems reviewed and are negative.    Physical Exam Updated Vital Signs BP (!) 174/76   Pulse 60   Temp (!) 97.4 F (36.3 C) (Oral)   Resp 14   Ht 5\' 2"  (1.575 m)   Wt 88.9 kg   SpO2 96%   BMI 35.85 kg/m   Physical Exam Constitutional:      Appearance: She is well-developed. She is obese.  HENT:     Head: Normocephalic and atraumatic.  Eyes:     Extraocular Movements: Extraocular movements intact.     Pupils: Pupils are equal, round, and reactive to light.  Neck:     Musculoskeletal: Normal range of motion and neck supple.  Cardiovascular:      Rate and Rhythm: Normal rate and regular rhythm.  Pulmonary:     Effort: Pulmonary effort is normal.     Breath sounds: Normal breath sounds. No decreased breath sounds or wheezing.  Abdominal:     General: Bowel sounds are normal.     Palpations: Abdomen is soft.  Musculoskeletal:     Right lower leg: She exhibits no tenderness. No edema.     Left lower leg: She exhibits no tenderness. No edema.  Skin:    General: Skin is warm.     Capillary Refill: Capillary refill takes less than 2 seconds.  Neurological:     General: No focal deficit present.     Mental Status: She is alert.  Psychiatric:        Mood and Affect: Mood normal.        Behavior: Behavior normal.      ED Treatments / Results  Labs (all labs ordered are listed,  but only abnormal results are displayed) Labs Reviewed  CBC WITH DIFFERENTIAL/PLATELET - Abnormal; Notable for the following components:      Result Value   RBC 5.26 (*)    Abs Immature Granulocytes 0.10 (*)    All other components within normal limits  COMPREHENSIVE METABOLIC PANEL  BRAIN NATRIURETIC PEPTIDE  TROPONIN I    EKG EKG Interpretation  Date/Time:  Saturday April 29 2018 22:42:37 EST Ventricular Rate:  58 PR Interval:    QRS Duration: 86 QT Interval:  412 QTC Calculation: 405 R Axis:   49 Text Interpretation:  Sinus rhythm Baseline wander in lead(s) I aVR aVL V6 No significant change since last tracing Confirmed by Theotis Burrow [phone]) on 04/29/2018 11:08:57 PM   Radiology Dg Chest 2 View  Result Date: 04/29/2018 CLINICAL DATA:  Recent diagnosis of acute bronchitis, presenting with acute worsening of shortness of breath tonight. Current history of asthma. Nonsmoker. EXAM: CHEST - 2 VIEW COMPARISON:  11/08/2017 and earlier. FINDINGS: Cardiac silhouette moderately enlarged, unchanged. Thoracic aorta tortuous and mildly atherosclerotic, unchanged. Hilar and mediastinal contours otherwise unremarkable. Patchy airspace opacities in  the RIGHT LOWER LOBE. Linear atelectasis in the LEFT LOWER LOBE and lingula. Lungs otherwise clear. Pulmonary vascularity normal. No pleural effusions. Visualized bony thorax intact. IMPRESSION: 1. Acute pneumonia involving the RIGHT LOWER LOBE. 2. Stable cardiomegaly without evidence of pulmonary edema. Electronically Signed   By: Evangeline Dakin M.D.   On: 04/29/2018 23:28    Procedures Procedures (including critical care time)  Medications Ordered in ED Medications - No data to display   Initial Impression / Assessment and Plan / ED Course  I have reviewed the triage vital signs and the nursing notes.  Pertinent labs & imaging results that were available during my care of the patient were reviewed by me and considered in my medical decision making (see chart for details).   63 year old female here with worsening shortness of breath in the setting of acute bronchitis being treated by her PCP.  Will obtain CXR as patient has not had imaging done during this illness.  We will also obtain CMP, CBC with differential, BNP, and troponin in order to rule out cardiac etiology such as CHF vs MI given that patient now has chest heaviness with her SOB.  EKG obtained in ED is grossly normal.  Well's score 0, so less concerning for PE, but will wait for other work up before ruling this out.   CXR with signs of RLL pneumonia, and stable cardiomegaly. CBC with no signs of anemia. Will ambulate patient with pulse ox. BNP, CMP, and troponin still pending. Patient signed out to Dr. Randal Buba to finish work up for SOB.   [place] Lua Feng, DO PGY-2, [facility]   Final Clinical Impressions(s) / ED Diagnoses   Final diagnoses:  Community acquired pneumonia of right lower lobe of lung [facility])    ED Discharge Orders    None       Laylamarie Meuser, [place], DO 04/30/18 0003    Little, Wenda Overland, MD 04/30/18 1458

## 2018-04-29 NOTE — ED Triage Notes (Addendum)
Pt reports she saw her PCP this week and was started on antibiotics and prednisone for cough that started approx 10 days ago.. States she has had increased SOB since yesterday. Saw PCP yesterday and given inhaler which is not helping. Reports increased SOB with exertion, worse tonight. Pt reports air travel within the last month

## 2018-04-29 NOTE — ED Notes (Signed)
Patient transported to X-ray 

## 2018-04-30 ENCOUNTER — Emergency Department (HOSPITAL_BASED_OUTPATIENT_CLINIC_OR_DEPARTMENT_OTHER): Payer: Self-pay

## 2018-04-30 DIAGNOSIS — I10 Essential (primary) hypertension: Secondary | ICD-10-CM

## 2018-04-30 DIAGNOSIS — J189 Pneumonia, unspecified organism: Secondary | ICD-10-CM | POA: Diagnosis present

## 2018-04-30 DIAGNOSIS — J181 Lobar pneumonia, unspecified organism: Secondary | ICD-10-CM

## 2018-04-30 LAB — COMPREHENSIVE METABOLIC PANEL
ALT: 33 U/L (ref 0–44)
AST: 22 U/L (ref 15–41)
Albumin: 3.6 g/dL (ref 3.5–5.0)
Alkaline Phosphatase: 75 U/L (ref 38–126)
Anion gap: 7 (ref 5–15)
BUN: 17 mg/dL (ref 8–23)
CO2: 27 mmol/L (ref 22–32)
Calcium: 8.7 mg/dL — ABNORMAL LOW (ref 8.9–10.3)
Chloride: 103 mmol/L (ref 98–111)
Creatinine, Ser: 0.72 mg/dL (ref 0.44–1.00)
GFR calc non Af Amer: >60 mL/min (ref >60)
Glucose, Bld: 112 mg/dL — ABNORMAL HIGH (ref 70–99)
Potassium: 3.8 mmol/L (ref 3.5–5.1)
Sodium: 137 mmol/L (ref 135–145)
Total Bilirubin: 0.6 mg/dL (ref 0.3–1.2)
Total Protein: 7.4 g/dL (ref 6.5–8.1)

## 2018-04-30 LAB — EXPECTORATED SPUTUM ASSESSMENT W REFEX TO RESP CULTURE

## 2018-04-30 LAB — EXPECTORATED SPUTUM ASSESSMENT W GRAM STAIN, RFLX TO RESP C

## 2018-04-30 LAB — TROPONIN I: Troponin I: <0.03 ng/mL (ref <0.03)

## 2018-04-30 LAB — BRAIN NATRIURETIC PEPTIDE: B NATRIURETIC PEPTIDE 5: 132.7 pg/mL — AB (ref 0.0–100.0)

## 2018-04-30 LAB — STREP PNEUMONIAE URINARY ANTIGEN: Strep Pneumo Urinary Antigen: NEGATIVE

## 2018-04-30 MED ORDER — LEVOFLOXACIN IN D5W 750 MG/150ML IV SOLN
750.0000 mg | INTRAVENOUS | Status: DC
  Administered 2018-05-01: 750 mg via INTRAVENOUS
  Filled 2018-04-30: qty 150

## 2018-04-30 MED ORDER — LEVOFLOXACIN IN D5W 500 MG/100ML IV SOLN
500.0000 mg | Freq: Once | INTRAVENOUS | Status: AC
  Administered 2018-04-30: 500 mg via INTRAVENOUS
  Filled 2018-04-30: qty 100

## 2018-04-30 MED ORDER — ENOXAPARIN SODIUM 40 MG/0.4ML ~~LOC~~ SOLN
40.0000 mg | SUBCUTANEOUS | Status: DC
  Administered 2018-04-30 – 2018-05-01 (×2): 40 mg via SUBCUTANEOUS
  Filled 2018-04-30 (×3): qty 0.4

## 2018-04-30 MED ORDER — GUAIFENESIN ER 600 MG PO TB12
600.0000 mg | ORAL_TABLET | Freq: Two times a day (BID) | ORAL | Status: DC
  Administered 2018-04-30 – 2018-05-02 (×5): 600 mg via ORAL
  Filled 2018-04-30 (×5): qty 1

## 2018-04-30 MED ORDER — ALBUTEROL SULFATE (2.5 MG/3ML) 0.083% IN NEBU: 2.5000 mg | INHALATION_SOLUTION | Freq: Four times a day (QID) | RESPIRATORY_TRACT | Status: DC | PRN

## 2018-04-30 MED ORDER — IOPAMIDOL (ISOVUE-370) INJECTION 76%
100.0000 mL | Freq: Once | INTRAVENOUS | Status: AC
  Administered 2018-04-30: 100 mL via INTRAVENOUS

## 2018-04-30 MED ORDER — ALBUTEROL SULFATE HFA 108 (90 BASE) MCG/ACT IN AERS: 1.0000 | INHALATION_SPRAY | Freq: Four times a day (QID) | RESPIRATORY_TRACT | Status: DC | PRN

## 2018-04-30 MED ORDER — PREDNISONE 20 MG PO TABS
40.0000 mg | ORAL_TABLET | Freq: Every day | ORAL | Status: DC
  Administered 2018-04-30 – 2018-05-02 (×3): 40 mg via ORAL
  Filled 2018-04-30 (×3): qty 2

## 2018-04-30 MED ORDER — LEVOTHYROXINE SODIUM 50 MCG PO TABS
150.0000 ug | ORAL_TABLET | Freq: Every day | ORAL | Status: DC
  Administered 2018-04-30 – 2018-05-02 (×3): 150 ug via ORAL
  Filled 2018-04-30 (×3): qty 1

## 2018-04-30 MED ORDER — SODIUM CHLORIDE 0.9 % IV SOLN
INTRAVENOUS | Status: DC | PRN
  Administered 2018-04-30: 500 mL via INTRAVENOUS
  Administered 2018-05-01: 250 mL via INTRAVENOUS

## 2018-04-30 MED ORDER — ASPIRIN-ACETAMINOPHEN-CAFFEINE 250-250-65 MG PO TABS
1.0000 | ORAL_TABLET | Freq: Once | ORAL | Status: AC
  Administered 2018-04-30: 1 via ORAL
  Filled 2018-04-30: qty 1

## 2018-04-30 MED ORDER — HYDRALAZINE HCL 20 MG/ML IJ SOLN: 5.0000 mg | INTRAMUSCULAR | Status: DC | PRN

## 2018-04-30 MED ORDER — IPRATROPIUM-ALBUTEROL 0.5-2.5 (3) MG/3ML IN SOLN
3.0000 mL | Freq: Three times a day (TID) | RESPIRATORY_TRACT | Status: DC
  Administered 2018-04-30 – 2018-05-02 (×6): 3 mL via RESPIRATORY_TRACT
  Filled 2018-04-30 (×6): qty 3

## 2018-04-30 MED ORDER — ACETAMINOPHEN 325 MG PO TABS
650.0000 mg | ORAL_TABLET | Freq: Four times a day (QID) | ORAL | Status: DC | PRN
  Administered 2018-04-30: 650 mg via ORAL
  Filled 2018-04-30: qty 2

## 2018-04-30 MED ORDER — PANTOPRAZOLE SODIUM 40 MG PO TBEC
40.0000 mg | DELAYED_RELEASE_TABLET | Freq: Every day | ORAL | Status: DC
  Administered 2018-04-30 – 2018-05-02 (×3): 40 mg via ORAL
  Filled 2018-04-30 (×3): qty 1

## 2018-04-30 MED ORDER — LIP MEDEX EX OINT
TOPICAL_OINTMENT | CUTANEOUS | Status: AC
  Administered 2018-04-30: 14:00:00
  Filled 2018-04-30: qty 7

## 2018-04-30 NOTE — ED Notes (Signed)
EDP at bedside to update patient 

## 2018-04-30 NOTE — ED Notes (Signed)
[facility] at bedside, saline locked for transport. BP improved.

## 2018-04-30 NOTE — Progress Notes (Signed)
Patient is admitted this am for lobar pneumonia and acute hypoxic respiratory failure. Vital signs are stable, no wheezing, no edema on exam. Continue current regimen, wean oxygen as tolerated

## 2018-04-30 NOTE — ED Notes (Signed)
Ambulated patient with pulse oximeter, pt desats to 89% on room air, reports shortness of breath. Provider notified.

## 2018-04-30 NOTE — H&P (Addendum)
History and Physical    Katrina [name] PFX:[id number] DOB: 05/24/1955 DOA: 04/29/2018  PCP: Marrian Salvage, [place]  Patient coming from: Home  I have personally briefly reviewed patient's old medical records in [place]  Chief Complaint: SOB  HPI: Katrina [name] is a 63 y.o. female with medical history significant of HTN, asthma, possible sarcoidosis in 2016 but spontaneous remission since then.  Patient presents to the ED with c/o worsening SOB.  ~10 days ago woke up with cough, headache, body aches, vomiting.  1 day prior had been around multiple people with influenza.  Went to doctors office 1/6, diagnosed with bronchitis.  Started on prednisone and doxycycline.  Symptoms including cough have improved; however, she has had progressively worsening SOB.  Presents to ED for worsening SOB.   ED Course: CTA suspicious for multifocal PNA.  Started on levaquin and sent to [facility] for admission.   Review of Systems: As per HPI otherwise 10 point review of systems negative.   Past Medical History:  Diagnosis Date  . Chest pain at rest 11/07/2012  . Diabetes mellitus without complication ([place])   . Eosinophilic esophagitis 32/99/2426  . Exercise-induced asthma   . GERD (gastroesophageal reflux disease)   . Hypertension 09/27/2017  . Hypothyroidism   . Sarcoidosis   . Shortness of breath 11/08/2012   RECENTLY  & WHEN i EXERCISE"  . Thyroid disease     Past Surgical History:  Procedure Laterality Date  . ABDOMINAL HYSTERECTOMY     Partial  . ESOPHAGOGASTRODUODENOSCOPY (EGD) WITH ESOPHAGEAL DILATION N/A 11/10/2012   Procedure: ESOPHAGOGASTRODUODENOSCOPY (EGD) WITH ESOPHAGEAL DILATION;  Surgeon: Gatha Mayer, MD;  Location: [facility];  Service: Endoscopy;  Laterality: N/A;  [place] vs. Balloon  . VIDEO BRONCHOSCOPY WITH ENDOBRONCHIAL ULTRASOUND Bilateral 01/15/2015   Procedure: VIDEO BRONCHOSCOPY WITH ENDOBRONCHIAL ULTRASOUND of  LEFT LUNG LOWER LOBE and right lung;   Surgeon: Collene Gobble, MD;  Location: [facility];  Service: Thoracic;  Laterality: Bilateral;     reports that she has never smoked. She has never used smokeless tobacco. She reports that she does not drink alcohol or use drugs.  Allergies  Allergen Reactions  . Penicillins Shortness Of Breath and Rash  . Bactrim Ds [Sulfamethoxazole-Trimethoprim] Rash    Upper body and Face redness with heat.      Family History  Problem Relation Age of Onset  . Diabetes Mother   . Stroke Mother   . Coronary artery disease Father   . Coronary artery disease Brother   . Hypothyroidism Maternal Grandmother   . Diabetes Maternal Grandfather   . Heart disease Paternal Grandmother   . Heart disease Paternal Grandfather      Prior to Admission medications   Medication Sig Start Date End Date Taking? Authorizing Provider  albuterol (PROVENTIL HFA;VENTOLIN HFA) 108 (90 Base) MCG/ACT inhaler Inhale 1-2 puffs into the lungs every 6 (six) hours as needed for wheezing or shortness of breath. 04/24/18  Yes Burns, Claudina Lick, MD  levothyroxine (SYNTHROID, LEVOTHROID) 150 MCG tablet Take 1 tablet (150 mcg total) by mouth daily before breakfast. 03/03/18  Yes Marrian Salvage, FNP  omeprazole (PRILOSEC) 20 MG capsule Take 20 mg by mouth daily.   Yes [provider]  predniSONE (DELTASONE) 20 MG tablet Take 2 tablets (40 mg total) by mouth daily with breakfast. 04/24/18  Yes Binnie Rail, MD    Physical Exam: Vitals:   04/30/18 0111 04/30/18 0215 04/30/18 0228 04/30/18 0316  BP: Marland Kitchen)  194/90  (!) 171/92 (!) 156/94  Pulse: (!) 58 (!) 57 (!) 59 68  Resp:    16  Temp:    97.8 F (36.6 C)  TempSrc:    Oral  SpO2: 97% 96% 97% 95%  Weight:      Height:        Constitutional: NAD, calm, comfortable Eyes: PERRL, lids and conjunctivae normal ENMT: Mucous membranes are moist. Posterior pharynx clear of any exudate or lesions.Normal dentition.  Neck: normal, supple, no masses, no  thyromegaly Respiratory: clear to auscultation bilaterally, no wheezing, no crackles. Normal respiratory effort. No accessory muscle use.  Cardiovascular: Regular rate and rhythm, no murmurs / rubs / gallops. No extremity edema. 2+ pedal pulses. No carotid bruits.  Abdomen: no tenderness, no masses palpated. No hepatosplenomegaly. Bowel sounds positive.  Musculoskeletal: no clubbing / cyanosis. No joint deformity upper and lower extremities. Good ROM, no contractures. Normal muscle tone.  Skin: no rashes, lesions, ulcers. No induration Neurologic: CN 2-12 grossly intact. Sensation intact, DTR normal. Strength 5/5 in all 4.  Psychiatric: Normal judgment and insight. Alert and oriented x 3. Normal mood.    Labs on Admission: I have personally reviewed following labs and imaging studies  CBC: Recent Labs  Lab 04/29/18 2344  WBC 7.9  NEUTROABS 3.8  HGB 14.0  HCT 44.1  MCV 83.8  PLT 657   Basic Metabolic Panel: Recent Labs  Lab 04/29/18 2344  NA 137  K 3.8  CL 103  CO2 27  GLUCOSE 112*  BUN 17  CREATININE 0.72  CALCIUM 8.7*   GFR: Estimated Creatinine Clearance: 73.6 mL/min (by C-G formula based on SCr of 0.72 mg/dL). Liver Function Tests: Recent Labs  Lab 04/29/18 2344  AST 22  ALT 33  ALKPHOS 75  BILITOT 0.6  PROT 7.4  ALBUMIN 3.6   No results for input(s): LIPASE, AMYLASE in the last 168 hours. No results for input(s): AMMONIA in the last 168 hours. Coagulation Profile: No results for input(s): INR, PROTIME in the last 168 hours. Cardiac Enzymes: Recent Labs  Lab 04/29/18 2344  TROPONINI <0.03   BNP (last 3 results) No results for input(s): PROBNP in the last 8760 hours. HbA1C: No results for input(s): HGBA1C in the last 72 hours. CBG: No results for input(s): GLUCAP in the last 168 hours. Lipid Profile: No results for input(s): CHOL, HDL, LDLCALC, TRIG, CHOLHDL, LDLDIRECT in the last 72 hours. Thyroid Function Tests: No results for input(s): TSH,  T4TOTAL, FREET4, T3FREE, THYROIDAB in the last 72 hours. Anemia Panel: No results for input(s): VITAMINB12, FOLATE, FERRITIN, TIBC, IRON, RETICCTPCT in the last 72 hours. Urine analysis:    Component Value Date/Time   COLORURINE YELLOW 12/26/2014 [place] 12/26/2014 1719   LABSPEC 1.013 12/26/2014 1719   PHURINE 6.0 12/26/2014 1719   GLUCOSEU NEGATIVE 12/26/2014 1719   HGBUR NEGATIVE 12/26/2014 1719   BILIRUBINUR neg 04/20/2017 1012   KETONESUR NEGATIVE 12/26/2014 1719   PROTEINUR neg 04/20/2017 1012   PROTEINUR NEGATIVE 12/26/2014 1719   UROBILINOGEN 0.2 04/20/2017 1012   UROBILINOGEN 0.2 12/26/2014 1719   NITRITE neg 04/20/2017 1012   NITRITE NEGATIVE 12/26/2014 1719   LEUKOCYTESUR Negative 04/20/2017 1012    Radiological Exams on Admission: Dg Chest 2 View  Result Date: 04/29/2018 CLINICAL DATA:  Recent diagnosis of acute bronchitis, presenting with acute worsening of shortness of breath tonight. Current history of asthma. Nonsmoker. EXAM: CHEST - 2 VIEW COMPARISON:  11/08/2017 and earlier. FINDINGS: Cardiac silhouette moderately  enlarged, unchanged. Thoracic aorta tortuous and mildly atherosclerotic, unchanged. Hilar and mediastinal contours otherwise unremarkable. Patchy airspace opacities in the RIGHT LOWER LOBE. Linear atelectasis in the LEFT LOWER LOBE and lingula. Lungs otherwise clear. Pulmonary vascularity normal. No pleural effusions. Visualized bony thorax intact. IMPRESSION: 1. Acute pneumonia involving the RIGHT LOWER LOBE. 2. Stable cardiomegaly without evidence of pulmonary edema. Electronically Signed   By: Evangeline Dakin M.D.   On: 04/29/2018 23:28   Ct Angio Chest Pe W And/or Wo Contrast  Result Date: 04/30/2018 CLINICAL DATA:  Shortness of breath EXAM: CT ANGIOGRAPHY CHEST WITH CONTRAST TECHNIQUE: Multidetector CT imaging of the chest was performed using the standard protocol during bolus administration of intravenous contrast. Multiplanar CT  image reconstructions and MIPs were obtained to evaluate the vascular anatomy. CONTRAST:  148mL ISOVUE-370 IOPAMIDOL (ISOVUE-370) INJECTION 76% COMPARISON:  04/29/2018, PET-CT 01/07/2015 FINDINGS: Cardiovascular: Satisfactory opacification of the pulmonary arteries to the segmental level. No evidence of pulmonary embolism. Nonaneurysmal aorta. Small pericardial effusion. Mild cardiomegaly. Mediastinum/Nodes: Midline trachea. No thyroid mass. Mild mediastinal and hilar nodes. Esophagus within normal limits. Lungs/Pleura: Multiple scattered bilateral subpleural nodules and nodular foci of airspace disease. Bandlike atelectasis in the right lower lobe, contiguous with a 15 x 17 mm more nodular area of density. Stable 5 mm subpleural right upper lobe lung nodule. No pleural effusion. Peribronchial thickening in the lower lobes with partial atelectasis. Upper Abdomen: No acute abnormality. Musculoskeletal: No chest wall abnormality. No acute or significant osseous findings. Review of the MIP images confirms the above findings. IMPRESSION: 1. Negative for acute pulmonary embolus 2. Multiple bilateral subpleural foci of nodular airspace disease which could be infectious or inflammatory in etiology. Bandlike density in the right lower lobe, contiguous with possible 17 x 15 mm right lower lobe lung nodule versus focus of pneumonia. Short interval CT follow-up recommended following antibiotic trial to ensure clearing. 3. Mild mediastinal and hilar adenopathy. This does not appear significantly changed and may relate to history of sarcoidosis in the patient's epic chart. Electronically Signed   By: Donavan Foil M.D.   On: 04/30/2018 01:00    EKG: Independently reviewed.  Assessment/Plan Principal Problem:   CAP (community acquired pneumonia) Active Problems:   Pulmonary sarcoidosis ([place])   Hypertension    1. CAP - with new O2 requirement with activity 1. PNA pathway 2. levaquin 1. Note, had C.Diff with  clindamycin treatment back in Nov, keep eye out for diarrhea. 3. Cultures pending 4. Adult wheeze protocol 5. Continue prednisone for now 6. F/U CT after resolution of symptoms to r/o pulm nodule. 2. ? Of prior pulmonary sarcoidosis - 1. Believed to be in remission 2. Patient may request pulm consult since she is followed by Dr. Melvyn Novas, or want the scan sent to Dr. Melvyn Novas for review at least. 3. HTN - 1. PRN Hydralazine  DVT prophylaxis: Lovenox Code Status: Full Family Communication: No family in room Disposition Plan: Home after admit Consults called: None Admission status: Place in obs    GARDNER, [place] [facility] Pager [phone] Only works nights!  If 7AM-7PM, please contact the primary day team physician taking care of patient  [email or web address] Password [facility]  04/30/2018, 3:32 AM

## 2018-05-01 ENCOUNTER — Encounter: Payer: Self-pay | Admitting: Nurse Practitioner

## 2018-05-01 DIAGNOSIS — D860 Sarcoidosis of lung: Secondary | ICD-10-CM

## 2018-05-01 DIAGNOSIS — J9601 Acute respiratory failure with hypoxia: Secondary | ICD-10-CM

## 2018-05-01 DIAGNOSIS — E669 Obesity, unspecified: Secondary | ICD-10-CM

## 2018-05-01 LAB — BASIC METABOLIC PANEL
Anion gap: 12 (ref 5–15)
BUN: 22 mg/dL (ref 8–23)
CO2: 25 mmol/L (ref 22–32)
CREATININE: 0.89 mg/dL (ref 0.44–1.00)
Calcium: 9.3 mg/dL (ref 8.9–10.3)
Chloride: 102 mmol/L (ref 98–111)
GFR calc Af Amer: >60 mL/min (ref >60)
GFR calc non Af Amer: >60 mL/min (ref >60)
GLUCOSE: 112 mg/dL — AB (ref 70–99)
Potassium: 4 mmol/L (ref 3.5–5.1)
SODIUM: 139 mmol/L (ref 135–145)

## 2018-05-01 LAB — CBC WITH DIFFERENTIAL/PLATELET
Abs Immature Granulocytes: 0.17 10*3/uL — ABNORMAL HIGH (ref 0.00–0.07)
Basophils Absolute: 0.1 10*3/uL (ref 0.0–0.1)
Basophils Relative: 1 %
Eosinophils Absolute: 0 10*3/uL (ref 0.0–0.5)
Eosinophils Relative: 0 %
HCT: 44.2 % (ref 36.0–46.0)
Hemoglobin: 14.3 g/dL (ref 12.0–15.0)
Immature Granulocytes: 2 %
Lymphocytes Relative: 35 %
Lymphs Abs: 3 10*3/uL (ref 0.7–4.0)
MCH: 26.7 pg (ref 26.0–34.0)
MCHC: 32.4 g/dL (ref 30.0–36.0)
MCV: 82.5 fL (ref 80.0–100.0)
MONO ABS: 0.9 10*3/uL (ref 0.1–1.0)
MONOS PCT: 11 %
Neutro Abs: 4.3 10*3/uL (ref 1.7–7.7)
Neutrophils Relative %: 51 %
PLATELETS: 372 10*3/uL (ref 150–400)
RBC: 5.36 MIL/uL — ABNORMAL HIGH (ref 3.87–5.11)
RDW: 13.1 % (ref 11.5–15.5)
WBC: 8.5 10*3/uL (ref 4.0–10.5)
nRBC: 0 % (ref 0.0–0.2)

## 2018-05-01 LAB — HIV ANTIBODY (ROUTINE TESTING W REFLEX): HIV Screen 4th Generation wRfx: NONREACTIVE

## 2018-05-01 MED ORDER — LEVOFLOXACIN 750 MG PO TABS
750.0000 mg | ORAL_TABLET | ORAL | Status: DC
  Administered 2018-05-01: 750 mg via ORAL
  Filled 2018-05-01: qty 1

## 2018-05-01 MED ORDER — ASPIRIN-ACETAMINOPHEN-CAFFEINE 250-250-65 MG PO TABS
1.0000 | ORAL_TABLET | Freq: Once | ORAL | Status: AC
  Administered 2018-05-01: 1 via ORAL
  Filled 2018-05-01: qty 1

## 2018-05-01 MED ORDER — SACCHAROMYCES BOULARDII 250 MG PO CAPS
250.0000 mg | ORAL_CAPSULE | Freq: Two times a day (BID) | ORAL | Status: DC
  Administered 2018-05-01 – 2018-05-02 (×3): 250 mg via ORAL
  Filled 2018-05-01 (×3): qty 1

## 2018-05-01 NOTE — Progress Notes (Signed)
Pharmacy IV to PO conversion  This patient is receiving Levaquin by the intravenous route. Based on criteria approved by the Pharmacy and Therapeutics Committee, and the Infectious Disease Division, the antibiotic(s) is/are being converted to equivalent oral dose form(s). These criteria include:   Patient being treated for a respiratory tract infection, urinary tract infection, cellulitis, or Clostridium Difficile Associated Diarrhea  The patient is not neutropenic and does not exhibit a GI malabsorption state  The patient is eating (either orally or per tube) and/or has been taking other orally administered medications for at least 24 hours.  The patient is improving clinically (physician assessment and a 24-hour Tmax of <=100.5 F)  If you have any questions about this conversion, please contact the Pharmacy Department (ext [phone]).  Thank you.  Reuel Boom, PharmD, BCPS [phone] 05/01/2018, 11:04 AM

## 2018-05-01 NOTE — Progress Notes (Signed)
PROGRESS NOTE  [place]rina [name] CMK:[id number] DOB: [birth year] DOA: 04/29/2018 PCP: Marrian Salvage, FNP  HPI/Recap of past 24 hours:  Continue to cough, less wheezing No chest pain, no edema  Assessment/Plan: Principal Problem:   CAP (community acquired pneumonia) Active Problems:   Pulmonary sarcoidosis ([place])   Hypertension   Lobar pneumonia ([place])    Lobar pneumonia/acute hypoxic respiratory failure H/o pcn and bactrim allergy H/o cdiff H/o significant acid reflux,  Difficult to choose abx So far tolerating levaquin, add on probiotics   Esophageal stricture s/p dilation H/o acid reflux on ppi  H/o cdiff after cleocin a few months ago No diarrhea  H/o ? Sarcoidosis  obesity Body mass index is 35.85 kg/m.  Code Status: full  Family Communication: patient   Disposition Plan: home tomorrow, ambulate check 02, f/u on sputum culuture   Consultants:  none  Procedures:  none  Antibiotics:  levaquin   Objective: BP 134/87 (BP Location: Left Arm)   Pulse (!) 59   Temp 98.1 F (36.7 C) (Oral)   Resp 16   Ht 5\' 2"  (1.575 m)   Wt 88.9 kg   SpO2 92%   BMI 35.85 kg/m   Intake/Output Summary (Last 24 hours) at 05/01/2018 1323 Last data filed at 05/01/2018 0300 Gross per 24 hour  Intake 339.35 ml  Output -  Net 339.35 ml   Filed Weights   04/29/18 2242  Weight: 88.9 kg    Exam: Patient is examined daily including today on 05/01/2018, exams remain the same as of yesterday except that has changed    General:  NAD  Cardiovascular: RRR  Respiratory: improved aeration   Abdomen: Soft/ND/NT, positive BS  Musculoskeletal: No Edema  Neuro: alert, oriented   Data Reviewed: Basic Metabolic Panel: Recent Labs  Lab 04/29/18 2344 05/01/18 0543  NA 137 139  K 3.8 4.0  CL 103 102  CO2 27 25  GLUCOSE 112* 112*  BUN 17 22  CREATININE 0.72 0.89  CALCIUM 8.7* 9.3   Liver Function Tests: Recent Labs  Lab 04/29/18 2344  AST 22    ALT 33  ALKPHOS 75  BILITOT 0.6  PROT 7.4  ALBUMIN 3.6   No results for input(s): LIPASE, AMYLASE in the last 168 hours. No results for input(s): AMMONIA in the last 168 hours. CBC: Recent Labs  Lab 04/29/18 2344 05/01/18 0543  WBC 7.9 8.5  NEUTROABS 3.8 4.3  HGB 14.0 14.3  HCT 44.1 44.2  MCV 83.8 82.5  PLT 298 372   Cardiac Enzymes:   Recent Labs  Lab 04/29/18 2344  TROPONINI <0.03   BNP (last 3 results) Recent Labs    04/29/18 2344  BNP 132.7*    ProBNP (last 3 results) No results for input(s): PROBNP in the last 8760 hours.  CBG: No results for input(s): GLUCAP in the last 168 hours.  Recent Results (from the past 240 hour(s))  Culture, sputum-assessment     Status: None   Collection Time: 04/30/18  3:15 AM  Result Value Ref Range Status   Specimen Description SPUTUM  Final   Special Requests NONE  Final   Sputum evaluation   Final    THIS SPECIMEN IS ACCEPTABLE FOR SPUTUM CULTURE Performed at [facility], [place] [address], [place], [place] [address]    Report Status 04/30/2018 FINAL  Final  Culture, respiratory     Status: None (Preliminary result)   Collection Time: 04/30/18  3:15 AM  Result Value Ref Range  Status   Specimen Description   Final    SPUTUM Performed at [facility] [address] [place], [place] [address]    Special Requests   Final    NONE Reflexed from [id number] Performed at [facility], [place] [address], [place], [place] [address]    Gram Stain   Final    ABUNDANT WBC PRESENT, PREDOMINANTLY PMN RARE GRAM POSITIVE COCCI Performed at [facility], [place] [address], [place], [place] [address]    Culture PENDING  Incomplete   Report Status PENDING  Incomplete  Culture, blood (routine x 2) Call MD if unable to obtain prior to antibiotics being given     Status: None (Preliminary result)   Collection Time: 04/30/18  5:20 AM  Result Value Ref Range Status   Specimen  Description   Final    BLOOD LEFT ANTECUBITAL Performed at [facility] [address] [place], [place] [address]    Special Requests   Final    BOTTLES DRAWN AEROBIC ONLY Blood Culture adequate volume Performed at [facility] [address], [place], [place] [address]    Culture   Final    NO GROWTH < 24 HOURS Performed at [facility] [address] [place], [place] [address]    Report Status PENDING  Incomplete  Culture, blood (routine x 2) Call MD if unable to obtain prior to antibiotics being given     Status: None (Preliminary result)   Collection Time: 04/30/18  5:20 AM  Result Value Ref Range Status   Specimen Description   Final    BLOOD LEFT ARM Performed at [facility] [address] [place], [place] [address]    Special Requests   Final    BOTTLES DRAWN AEROBIC ONLY Blood Culture adequate volume Performed at [facility] [address], [place], [place] [address]    Culture   Final    NO GROWTH < 24 HOURS Performed at [facility] [address], [place], [place] [address]    Report Status PENDING  Incomplete     Studies: No results found.  Scheduled Meds: . enoxaparin (LOVENOX) injection  40 mg Subcutaneous Q24H  . guaiFENesin  600 mg Oral BID  . ipratropium-albuterol  3 mL Nebulization TID  . levofloxacin  750 mg Oral Q24H  . levothyroxine  150 mcg Oral QAC breakfast  . pantoprazole  40 mg Oral Daily  . predniSONE  40 mg Oral Q breakfast  . saccharomyces boulardii  250 mg Oral BID    Continuous Infusions: . sodium chloride 250 mL (05/01/18 0140)     Time spent: 25 mins I have personally reviewed and interpreted on  05/01/2018 daily labs,  imagings as discussed above under date review session and assessment and plans.  I reviewed all nursing notes, pharmacy notes,  vitals, pertinent old records  I have discussed plan of care as described above with RN  , patient  on 05/01/2018   Florencia Reasons MD, PhD  [facility] Pager [phone]. If 7PM-7AM, please contact night-coverage at [email or web address], password [facility] 05/01/2018, 1:23 PM  LOS: 0 days

## 2018-05-01 NOTE — Progress Notes (Signed)
Ambulatory room air sats 96%.

## 2018-05-02 DIAGNOSIS — K222 Esophageal obstruction: Secondary | ICD-10-CM

## 2018-05-02 MED ORDER — GUAIFENESIN ER 600 MG PO TB12: 600.0000 mg | ORAL_TABLET | Freq: Two times a day (BID) | ORAL | 0 refills | Status: DC

## 2018-05-02 MED ORDER — SACCHAROMYCES BOULARDII 250 MG PO CAPS: 250.0000 mg | ORAL_CAPSULE | Freq: Two times a day (BID) | ORAL | 0 refills | Status: DC

## 2018-05-02 MED ORDER — IPRATROPIUM-ALBUTEROL 0.5-2.5 (3) MG/3ML IN SOLN: 3.0000 mL | Freq: Two times a day (BID) | RESPIRATORY_TRACT | Status: DC

## 2018-05-02 MED ORDER — MOXIFLOXACIN HCL 400 MG PO TABS: 400.0000 mg | ORAL_TABLET | Freq: Every day | ORAL | 0 refills | Status: AC

## 2018-05-02 NOTE — Discharge Summary (Signed)
Discharge Summary  [name] UGQ:[id number] DOB: [birth year]  PCP: [place] Salvage, FNP  Admit date: 04/29/2018 Discharge date: 05/02/2018  Time spent: 21mins  Recommendations for Outpatient Follow-up:  1. F/u with PCP within a week  for hospital discharge follow up, repeat cbc/bmp at follow up, pcp to follow up on final sputum culture result. pcp to repeat CT chest in 3-4 weeks, consider to refer to pulmonology if indicated 2. F/u with GI for h/o acid reflux, esophageal stricture   Discharge Diagnoses:  Active Hospital Problems   Diagnosis Date Noted  . CAP (community acquired pneumonia) 04/30/2018  . Acute hypoxemic respiratory failure ([place])   . Obesity (BMI 30.0-34.9)   . Lobar pneumonia ([place])   . Hypertension 09/27/2017  . Pulmonary sarcoidosis [facility])     Resolved Hospital Problems  No resolved problems to display.    Discharge Condition: stable  Diet recommendation: heart healthy  Filed Weights   04/29/18 2242  Weight: 88.9 kg    History of present illness: ( per admitting MD Dr Alcario Drought) PCP: [place]rina [name], [place]  Patient coming from: Home  I have personally briefly reviewed patient's old medical records in [place]  Chief Complaint: SOB  HPI: Katrina [name] is a 63 y.o. female with medical history significant of HTN, asthma, possible sarcoidosis in 2016 but spontaneous remission since then.  Patient presents to the ED with c/o worsening SOB.  ~10 days ago woke up with cough, headache, body aches, vomiting.  1 day prior had been around multiple people with influenza.  Went to doctors office 1/6, diagnosed with bronchitis.  Started on prednisone and doxycycline.  Symptoms including cough have improved; however, she has had progressively worsening SOB.  Presents to ED for worsening SOB.   ED Course: CTA suspicious for multifocal PNA.  Started on levaquin and sent to [facility] for admission.   Hospital Course:  Principal  Problem:   CAP (community acquired pneumonia) Active Problems:   Pulmonary sarcoidosis ([place])   Hypertension   Lobar pneumonia ([place])   Acute hypoxemic respiratory failure ([facility])   Obesity (BMI 30.0-34.9)   Lobar pneumonia/acute hypoxic respiratory failure  CTA :  "1. Negative for acute pulmonary embolus 2. Multiple bilateral subpleural foci of nodular airspace disease which could be infectious or inflammatory in etiology. Bandlike density in the right lower lobe, contiguous with possible 17 x 15 mm right lower lobe lung nodule versus focus of pneumonia. Short interval CT follow-up recommended following antibiotic trial to ensure clearing. 3. Mild mediastinal and hilar adenopathy. This does not appear significantly changed and may relate to history of sarcoidosis in the patient's epic chart."  H/o pcn and bactrim allergy H/o cdiff H/o significant acid reflux,  Difficult to choose abx So far tolerating levaquin, add on probiotics, she is discharged on avelox/probiotics.  sputum culture in process, no fever ,no leukocytosis, she ambulated on room air, o2 sats above 96%.  Esophageal stricture s/p dilation H/o acid reflux on ppi F/u with GI  H/o cdiff after cleocin a few months ago No diarrhea  H/o ? Sarcoidosis  obesity  Body mass index is 35.85 kg/m.  Code Status: full  Family Communication: patient   Disposition Plan: home  Consultants:  none  Procedures:  none  Antibiotics:  levaquin   Discharge Exam: BP (!) 143/88 (BP Location: Left Arm)   Pulse 64   Temp 98.1 F (36.7 C) (Oral)   Resp 16   Ht 5\' 2"  (1.575 m)  Wt 88.9 kg   SpO2 96%   BMI 35.85 kg/m   General: NAD Cardiovascular: RRR Respiratory: lung exam improved , rhonchi has resolved, no wheezing, no rales, improved aeration   Discharge Instructions You were cared for by a hospitalist during your hospital stay. If you have any questions about your discharge medications or  the care you received while you were in the hospital after you are discharged, you can call the unit and asked to speak with the hospitalist on call if the hospitalist that took care of you is not available. Once you are discharged, your primary care physician will handle any further medical issues. Please note that NO REFILLS for any discharge medications will be authorized once you are discharged, as it is imperative that you return to your primary care physician (or establish a relationship with a primary care physician if you do not have one) for your aftercare needs so that they can reassess your need for medications and monitor your lab values.  Discharge Instructions    Diet - low sodium heart healthy   Complete by:  As directed    Increase activity slowly   Complete by:  As directed      Allergies as of 05/02/2018      Reactions   Penicillins Shortness Of Breath, Rash   DID THE REACTION INVOLVE: Swelling of the face/tongue/throat, SOB, or low BP? N Sudden or severe rash/hives, skin peeling, or the inside of the mouth or nose? Yes Did it require medical treatment? No When did it last happen?2010 If all above answers are "NO", may proceed with cephalosporin use.   Bactrim Ds [sulfamethoxazole-trimethoprim] Rash   Upper body and Face redness with heat.       Medication List    STOP taking these medications   doxycycline 100 MG EC tablet Commonly known as:  DORYX     TAKE these medications   albuterol 108 (90 Base) MCG/ACT inhaler Commonly known as:  PROVENTIL HFA;VENTOLIN HFA Inhale 1-2 puffs into the lungs every 6 (six) hours as needed for wheezing or shortness of breath.   guaiFENesin 600 MG 12 hr tablet Commonly known as:  MUCINEX Take 1 tablet (600 mg total) by mouth 2 (two) times daily.   levothyroxine 150 MCG tablet Commonly known as:  SYNTHROID, LEVOTHROID Take 1 tablet (150 mcg total) by mouth daily before breakfast.   moxifloxacin 400 MG tablet Commonly  known as:  AVELOX Take 1 tablet (400 mg total) by mouth daily for 3 days.   omeprazole 20 MG capsule Commonly known as:  PRILOSEC Take 20 mg by mouth daily.   predniSONE 20 MG tablet Commonly known as:  DELTASONE Take 2 tablets (40 mg total) by mouth daily with breakfast.   saccharomyces boulardii 250 MG capsule Commonly known as:  FLORASTOR Take 1 capsule (250 mg total) by mouth 2 (two) times daily.      Allergies  Allergen Reactions  . Penicillins Shortness Of Breath and Rash    DID THE REACTION INVOLVE: Swelling of the face/tongue/throat, SOB, or low BP? N Sudden or severe rash/hives, skin peeling, or the inside of the mouth or nose? Yes Did it require medical treatment? No When did it last happen?2010 If all above answers are "NO", may proceed with cephalosporin use.   . Bactrim Ds [Sulfamethoxazole-Trimethoprim] Rash    Upper body and Face redness with heat.     Follow-up Information    Katrina Salvage, FNP. Schedule an appointment as soon  as possible for a visit in 1 week(s).   Specialty:  Internal Medicine Why:  hospital discharge follow up in one week.  pcp to repeat ct chest in 3-4 weeks , consider to refer to pulmonology if indicated. Contact information: [place] [address] [phone]        Gatha Mayer, MD Follow up.   Specialty:  Gastroenterology Contact information: [address] [place] [address] [phone]            The results of significant diagnostics from this hospitalization (including imaging, microbiology, ancillary and laboratory) are listed below for reference.    Significant Diagnostic Studies: Dg Chest 2 View  Result Date: 04/29/2018 CLINICAL DATA:  Recent diagnosis of acute bronchitis, presenting with acute worsening of shortness of breath tonight. Current history of asthma. Nonsmoker. EXAM: CHEST - 2 VIEW COMPARISON:  11/08/2017 and earlier. FINDINGS: Cardiac silhouette moderately  enlarged, unchanged. Thoracic aorta tortuous and mildly atherosclerotic, unchanged. Hilar and mediastinal contours otherwise unremarkable. Patchy airspace opacities in the RIGHT LOWER LOBE. Linear atelectasis in the LEFT LOWER LOBE and lingula. Lungs otherwise clear. Pulmonary vascularity normal. No pleural effusions. Visualized bony thorax intact. IMPRESSION: 1. Acute pneumonia involving the RIGHT LOWER LOBE. 2. Stable cardiomegaly without evidence of pulmonary edema. Electronically Signed   By: Evangeline Dakin M.D.   On: 04/29/2018 23:28   Ct Angio Chest Pe W And/or Wo Contrast  Result Date: 04/30/2018 CLINICAL DATA:  Shortness of breath EXAM: CT ANGIOGRAPHY CHEST WITH CONTRAST TECHNIQUE: Multidetector CT imaging of the chest was performed using the standard protocol during bolus administration of intravenous contrast. Multiplanar CT image reconstructions and MIPs were obtained to evaluate the vascular anatomy. CONTRAST:  136mL ISOVUE-370 IOPAMIDOL (ISOVUE-370) INJECTION 76% COMPARISON:  04/29/2018, PET-CT 01/07/2015 FINDINGS: Cardiovascular: Satisfactory opacification of the pulmonary arteries to the segmental level. No evidence of pulmonary embolism. Nonaneurysmal aorta. Small pericardial effusion. Mild cardiomegaly. Mediastinum/Nodes: Midline trachea. No thyroid mass. Mild mediastinal and hilar nodes. Esophagus within normal limits. Lungs/Pleura: Multiple scattered bilateral subpleural nodules and nodular foci of airspace disease. Bandlike atelectasis in the right lower lobe, contiguous with a 15 x 17 mm more nodular area of density. Stable 5 mm subpleural right upper lobe lung nodule. No pleural effusion. Peribronchial thickening in the lower lobes with partial atelectasis. Upper Abdomen: No acute abnormality. Musculoskeletal: No chest wall abnormality. No acute or significant osseous findings. Review of the MIP images confirms the above findings. IMPRESSION: 1. Negative for acute pulmonary embolus 2.  Multiple bilateral subpleural foci of nodular airspace disease which could be infectious or inflammatory in etiology. Bandlike density in the right lower lobe, contiguous with possible 17 x 15 mm right lower lobe lung nodule versus focus of pneumonia. Short interval CT follow-up recommended following antibiotic trial to ensure clearing. 3. Mild mediastinal and hilar adenopathy. This does not appear significantly changed and may relate to history of sarcoidosis in the patient's epic chart. Electronically Signed   By: Donavan Foil M.D.   On: 04/30/2018 01:00    Microbiology: Recent Results (from the past 240 hour(s))  Culture, sputum-assessment     Status: None   Collection Time: 04/30/18  3:15 AM  Result Value Ref Range Status   Specimen Description SPUTUM  Final   Special Requests NONE  Final   Sputum evaluation   Final    THIS SPECIMEN IS ACCEPTABLE FOR SPUTUM CULTURE Performed at [facility], [place] [address] [place], [place] [address]  Report Status 04/30/2018 FINAL  Final  Culture, respiratory     Status: None (Preliminary result)   Collection Time: 04/30/18  3:15 AM  Result Value Ref Range Status   Specimen Description   Final    SPUTUM Performed at [facility] [address], [place], [place] [address]    Special Requests   Final    NONE Reflexed from [id number] Performed at [facility], [place] [address] [place], [place] [address]    Gram Stain   Final    ABUNDANT WBC PRESENT, PREDOMINANTLY PMN RARE GRAM POSITIVE COCCI    Culture   Final    CULTURE REINCUBATED FOR BETTER GROWTH Performed at [facility], [place] [address], [place], [place] [address]    Report Status PENDING  Incomplete  Culture, blood (routine x 2) Call MD if unable to obtain prior to antibiotics being given     Status: None (Preliminary result)   Collection Time: 04/30/18  5:20 AM  Result Value Ref Range Status   Specimen Description   Final     BLOOD LEFT ANTECUBITAL Performed at [facility] [address], [place], [place] [address]    Special Requests   Final    BOTTLES DRAWN AEROBIC ONLY Blood Culture adequate volume Performed at [facility] [address], [place], [place] [address]    Culture   Final    NO GROWTH < 24 HOURS Performed at [facility] [address] [place], [place] [address]    Report Status PENDING  Incomplete  Culture, blood (routine x 2) Call MD if unable to obtain prior to antibiotics being given     Status: None (Preliminary result)   Collection Time: 04/30/18  5:20 AM  Result Value Ref Range Status   Specimen Description BLOOD LEFT ARM  Final   Special Requests   Final    BOTTLES DRAWN AEROBIC ONLY Blood Culture adequate volume Performed at [facility] [address], [place], [place] [address]    Culture   Final    NO GROWTH < 24 HOURS Performed at [facility] [address] [place], [place] [address]    Report Status PENDING  Incomplete     Labs: Basic Metabolic Panel: Recent Labs  Lab 04/29/18 2344 05/01/18 0543  NA 137 139  K 3.8 4.0  CL 103 102  CO2 27 25  GLUCOSE 112* 112*  BUN 17 22  CREATININE 0.72 0.89  CALCIUM 8.7* 9.3   Liver Function Tests: Recent Labs  Lab 04/29/18 2344  AST 22  ALT 33  ALKPHOS 75  BILITOT 0.6  PROT 7.4  ALBUMIN 3.6   No results for input(s): LIPASE, AMYLASE in the last 168 hours. No results for input(s): AMMONIA in the last 168 hours. CBC: Recent Labs  Lab 04/29/18 2344 05/01/18 0543  WBC 7.9 8.5  NEUTROABS 3.8 4.3  HGB 14.0 14.3  HCT 44.1 44.2  MCV 83.8 82.5  PLT 298 372   Cardiac Enzymes: Recent Labs  Lab 04/29/18 2344  TROPONINI <0.03   BNP: BNP (last 3 results) Recent Labs    04/29/18 2344  BNP 132.7*    ProBNP (last 3 results) No results for input(s): PROBNP in the last 8760 hours.  CBG: No results for input(s): GLUCAP in the last  168 hours.     Signed:  Florencia Reasons MD, PhD  [facility] 05/02/2018, 11:36 AM

## 2018-05-03 LAB — CULTURE, RESPIRATORY W GRAM STAIN: Culture: NORMAL

## 2018-05-03 LAB — CULTURE, RESPIRATORY

## 2018-05-05 LAB — CULTURE, BLOOD (ROUTINE X 2)
Culture: NO GROWTH
Culture: NO GROWTH
Special Requests: ADEQUATE
Special Requests: ADEQUATE

## 2018-05-17 ENCOUNTER — Other Ambulatory Visit: Payer: Self-pay

## 2018-05-17 ENCOUNTER — Emergency Department (HOSPITAL_COMMUNITY)
Admission: EM | Admit: 2018-05-17 | Discharge: 2018-05-17 | Disposition: A | Discharge status: Home / Self Care | Payer: Self-pay | Attending: Emergency Medicine | Admitting: Emergency Medicine

## 2018-05-17 ENCOUNTER — Ambulatory Visit: Payer: Self-pay | Admitting: Neurology

## 2018-05-17 ENCOUNTER — Emergency Department (HOSPITAL_COMMUNITY): Payer: Self-pay

## 2018-05-17 ENCOUNTER — Encounter (HOSPITAL_COMMUNITY): Payer: Self-pay | Admitting: Emergency Medicine

## 2018-05-17 DIAGNOSIS — I10 Essential (primary) hypertension: Secondary | ICD-10-CM | POA: Insufficient documentation

## 2018-05-17 DIAGNOSIS — J45901 Unspecified asthma with (acute) exacerbation: Secondary | ICD-10-CM | POA: Insufficient documentation

## 2018-05-17 DIAGNOSIS — Z79899 Other long term (current) drug therapy: Secondary | ICD-10-CM | POA: Insufficient documentation

## 2018-05-17 DIAGNOSIS — E039 Hypothyroidism, unspecified: Secondary | ICD-10-CM | POA: Insufficient documentation

## 2018-05-17 DIAGNOSIS — J4521 Mild intermittent asthma with (acute) exacerbation: Secondary | ICD-10-CM

## 2018-05-17 DIAGNOSIS — E119 Type 2 diabetes mellitus without complications: Secondary | ICD-10-CM | POA: Insufficient documentation

## 2018-05-17 HISTORY — DX: Esophageal obstruction: K22.2

## 2018-05-17 HISTORY — DX: Cough: R05

## 2018-05-17 HISTORY — DX: Other specified cough: R05.8

## 2018-05-17 LAB — CBC
HCT: 42.8 % (ref 36.0–46.0)
HEMOGLOBIN: 14 g/dL (ref 12.0–15.0)
MCH: 28.1 pg (ref 26.0–34.0)
MCHC: 32.7 g/dL (ref 30.0–36.0)
MCV: 85.8 fL (ref 80.0–100.0)
Platelets: 220 10*3/uL (ref 150–400)
RBC: 4.99 MIL/uL (ref 3.87–5.11)
RDW: 13.3 % (ref 11.5–15.5)
WBC: 7.8 10*3/uL (ref 4.0–10.5)
nRBC: 0 % (ref 0.0–0.2)

## 2018-05-17 LAB — BASIC METABOLIC PANEL
Anion gap: 8 (ref 5–15)
BUN: 14 mg/dL (ref 8–23)
CO2: 29 mmol/L (ref 22–32)
Calcium: 9.1 mg/dL (ref 8.9–10.3)
Chloride: 102 mmol/L (ref 98–111)
Creatinine, Ser: 0.77 mg/dL (ref 0.44–1.00)
GFR calc Af Amer: >60 mL/min (ref >60)
GFR calc non Af Amer: >60 mL/min (ref >60)
Glucose, Bld: 187 mg/dL — ABNORMAL HIGH (ref 70–99)
Potassium: 4.1 mmol/L (ref 3.5–5.1)
Sodium: 139 mmol/L (ref 135–145)

## 2018-05-17 LAB — POCT I-STAT TROPONIN I
Troponin i, poc: 0 ng/mL (ref 0.00–0.08)
Troponin i, poc: 0 ng/mL (ref 0.00–0.08)

## 2018-05-17 LAB — BRAIN NATRIURETIC PEPTIDE: B Natriuretic Peptide: 82.9 pg/mL (ref 0.0–100.0)

## 2018-05-17 LAB — D-DIMER, QUANTITATIVE: D-Dimer, Quant: 0.31 ug/mL-FEU (ref 0.00–0.50)

## 2018-05-17 MED ORDER — SODIUM CHLORIDE 0.9% FLUSH
3.0000 mL | Freq: Once | INTRAVENOUS | Status: AC
  Administered 2018-05-17: 3 mL via INTRAVENOUS

## 2018-05-17 MED ORDER — IPRATROPIUM-ALBUTEROL 0.5-2.5 (3) MG/3ML IN SOLN
3.0000 mL | Freq: Once | RESPIRATORY_TRACT | Status: AC
  Administered 2018-05-17: 3 mL via RESPIRATORY_TRACT
  Filled 2018-05-17: qty 3

## 2018-05-17 MED ORDER — ALBUTEROL SULFATE (2.5 MG/3ML) 0.083% IN NEBU
2.5000 mg | INHALATION_SOLUTION | Freq: Once | RESPIRATORY_TRACT | Status: AC
  Administered 2018-05-17: 2.5 mg via RESPIRATORY_TRACT
  Filled 2018-05-17: qty 3

## 2018-05-17 MED ORDER — PREDNISONE 20 MG PO TABS: 40.0000 mg | ORAL_TABLET | Freq: Every day | ORAL | 0 refills | Status: DC

## 2018-05-17 MED ORDER — PREDNISONE 20 MG PO TABS
60.0000 mg | ORAL_TABLET | Freq: Once | ORAL | Status: AC
  Administered 2018-05-17: 60 mg via ORAL
  Filled 2018-05-17: qty 3

## 2018-05-17 MED ORDER — ALBUTEROL SULFATE HFA 108 (90 BASE) MCG/ACT IN AERS: 2.0000 | INHALATION_SPRAY | RESPIRATORY_TRACT | 0 refills | Status: DC | PRN

## 2018-05-17 MED ORDER — ACETAMINOPHEN 325 MG PO TABS
650.0000 mg | ORAL_TABLET | Freq: Once | ORAL | Status: AC
  Administered 2018-05-17: 650 mg via ORAL
  Filled 2018-05-17: qty 2

## 2018-05-17 NOTE — Discharge Instructions (Addendum)
Take the prescriptions as directed.  Use your albuterol inhaler (2 to 4 puffs) every 4 hours for the next 7 days, then as needed for cough, wheezing, or shortness of breath.  Call your regular medical doctor tomorrow morning to schedule a follow up appointment within the next 2 days.  Return to the Emergency Department immediately sooner if worsening.  ° °

## 2018-05-17 NOTE — ED Notes (Signed)
Ambulating Pulse ox was 93-95%.

## 2018-05-17 NOTE — ED Triage Notes (Signed)
Endorses intermittent chest pain which began yesterday.  C/o shortness of breath. Additionally states her BP was high at [facility].  States BP was 186/95.

## 2018-05-17 NOTE — ED Provider Notes (Signed)
[facility] DEPT Provider Note   CSN: [id number] Arrival date & time: 05/17/18  1239     History   Chief Complaint Chief Complaint  Patient presents with  . Chest Pain    HPI Katrina [name] is a 63 y.o. female.   Chest Pain    Pt was seen at 1720. Per pt, c/o gradual onset and persistence of constant mid-sternal chest "pain" that she noticed when she woke up this morning at 0700. Pt states the CP has been present all day. Has been associated with SOB and wheezing today, though pt does admit she actually was SOB and wheezing yesterday. Pt states she was recently admitted 05/02/2018 and tx for pneumonia. States she took her BP at [employer] this afternoon and it was "186/95." Pt endorses hx of asthma. Denies fevers, no palpitations, no cough, no back pain, no abd pain, no N/V/D, no visual changes, no focal motor weakness, no tingling/numbness in extremities, no ataxia, no slurred speech, no facial droop.     Past Medical History:  Diagnosis Date  . Chest pain at rest 11/07/2012  . Diabetes mellitus without complication ([place])   . Eosinophilic esophagitis 32/35/5732  . Exercise-induced asthma   . GERD (gastroesophageal reflux disease)   . Hypertension 09/27/2017  . Hypothyroidism   . Sarcoidosis   . Shortness of breath 11/08/2012   RECENTLY  & WHEN i EXERCISE"  . Thyroid disease     Patient Active Problem List   Diagnosis Date Noted  . Esophageal stricture   . Acute hypoxemic respiratory failure ([place])   . Obesity (BMI 30.0-34.9)   . CAP (community acquired pneumonia) 04/30/2018  . Lobar pneumonia ([place])   . Hypertension 09/27/2017  . 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 Low back pain associated with a spinal disorder other than radiculopathy or spinal stenosis 03/01/2016  . Myalgia 10/09/2015  . Upper airway cough syndrome 01/26/2015  . Obesity (BMI 30-39.9) 01/26/2015  . Pulmonary nodules   . DOE (dyspnea on exertion) 01/01/2015  .  Pulmonary sarcoidosis ([place])   . Mediastinal lymphadenopathy 12/26/2014  . Esophageal ring, acquired 11/10/2012  . Dysphagia, unspecified(787.20) 11/09/2012  . Hypothyroidism 11/08/2012  . 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 Hypothyroidism, acquired 12/28/2007  . GERD 12/28/2007  . 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 ABDOMINAL HYSTERECTOMY     Partial  . ESOPHAGOGASTRODUODENOSCOPY (EGD) WITH ESOPHAGEAL DILATION N/A 11/10/2012   Procedure: ESOPHAGOGASTRODUODENOSCOPY (EGD) WITH ESOPHAGEAL DILATION;  Surgeon: Gatha Mayer, MD;  Location: [facility];  Service: Endoscopy;  Laterality: N/A;  [place] vs. [place]  . VIDEO BRONCHOSCOPY WITH ENDOBRONCHIAL ULTRASOUND Bilateral 01/15/2015   Procedure: VIDEO BRONCHOSCOPY WITH ENDOBRONCHIAL ULTRASOUND of  LEFT LUNG LOWER LOBE and right lung;  Surgeon: Collene Gobble, MD;  Location: [facility];  Service: Thoracic;  Laterality: Bilateral;     OB History   No obstetric history on file.      Home Medications    Prior to Admission medications   Medication Sig Start Date End Date Taking? Authorizing Provider  albuterol (PROVENTIL HFA;VENTOLIN HFA) 108 (90 Base) MCG/ACT inhaler Inhale 1-2 puffs into the lungs every 6 (six) hours as needed for wheezing or shortness of breath. 04/24/18  Yes Burns, Claudina Lick, MD  guaiFENesin (MUCINEX) 600 MG 12 hr tablet Take 1 tablet (600 mg total) by mouth 2 (two) times daily. 05/02/18  Yes Florencia Reasons,  MD  levothyroxine (SYNTHROID, LEVOTHROID) 150 MCG tablet Take 1 tablet (150 mcg total) by mouth daily before breakfast. 03/03/18  Yes Marrian Salvage, FNP  omeprazole (PRILOSEC) 20 MG capsule Take 20 mg by mouth daily.   Yes [provider]  saccharomyces boulardii (FLORASTOR) 250 MG capsule Take 1 capsule (250 mg total) by mouth 2 (two) times daily. 05/02/18  Yes Florencia Reasons, MD  predniSONE (DELTASONE) 20 MG tablet Take  2 tablets (40 mg total) by mouth daily with breakfast. Patient not taking: Reported on 05/17/2018 04/24/18   Binnie Rail, MD    Family History Family History  Problem Relation Age of Onset  . Diabetes Mother   . Stroke Mother   . Coronary artery disease Father   . Coronary artery disease Brother   . Hypothyroidism Maternal Grandmother   . Diabetes Maternal Grandfather   . Heart disease Paternal Grandmother   . Heart disease Paternal Grandfather     Social History Social History   Tobacco Use  . Smoking status: Never Smoker  . Smokeless tobacco: Never Used  Substance Use Topics  . Alcohol use: No  . Drug use: No     Allergies   Penicillins and Bactrim ds [sulfamethoxazole-trimethoprim]   Review of Systems Review of Systems  Cardiovascular: Positive for chest pain.  ROS: Statement: All systems negative except as marked or noted in the HPI; Constitutional: Negative for fever and chills. ; ; Eyes: Negative for eye pain, redness and discharge. ; ; ENMT: Negative for ear pain, hoarseness, nasal congestion, sinus pressure and sore throat. ; ; Cardiovascular: +CP, SOB. Negative for palpitations, diaphoresis, and peripheral edema. ; ; Respiratory: +wheezing. Negative for cough and stridor. ; ; Gastrointestinal: Negative for nausea, vomiting, diarrhea, abdominal pain, blood in stool, hematemesis, jaundice and rectal bleeding. . ; ; Genitourinary: Negative for dysuria, flank pain and hematuria. ; ; Musculoskeletal: Negative for back pain and neck pain. Negative for swelling and trauma.; ; Skin: Negative for pruritus, rash, abrasions, blisters, bruising and skin lesion.; ; Neuro: Negative for headache, lightheadedness and neck stiffness. Negative for weakness, altered level of consciousness, altered mental status, extremity weakness, paresthesias, involuntary movement, seizure and syncope.        Physical Exam Updated Vital Signs BP (!) 166/91 (BP Location: Left Arm)   Pulse 66    Temp 98.7 F (37.1 C) (Oral)   Resp 18   Ht 5\' 2"  (1.575 m)   Wt 88.5 kg   SpO2 97%   BMI 35.67 kg/m    Patient Vitals for the past 24 hrs:  BP Temp Temp src Pulse Resp SpO2 Height Weight  05/17/18 2100 (!) 152/67 - - 70 13 93 % - -  05/17/18 2030 123/70 - - 73 13 90 % - -  05/17/18 2002 - - - 83 13 95 % - -  05/17/18 2000 (!) 142/72 - - 83 15 (!) 89 % - -  05/17/18 1930 140/80 - - 69 11 100 % - -  05/17/18 1853 (!) 162/83 - - 73 16 95 % - -  05/17/18 1556 (!) 166/91 98.7 F (37.1 C) Oral 66 18 97 % - -  05/17/18 1300 (!) 170/77 98.4 F (36.9 C) Oral 67 16 97 % 5\' 2"  (1.575 m) 88.5 kg     Physical Exam 1725: Physical examination:  Nursing notes reviewed; Vital signs and O2 SAT reviewed;  Constitutional: Well developed, Well nourished, Well hydrated, In no acute distress; Head:  Normocephalic, atraumatic; Eyes:  EOMI, PERRL, No scleral icterus; ENMT: Mouth and pharynx normal, Mucous membranes moist; Neck: Supple, Full range of motion, No lymphadenopathy; Cardiovascular: Regular rate and rhythm, No gallop; Respiratory: Breath sounds clear & equal bilaterally, No wheezes.  Speaking full sentences with ease, Normal respiratory effort/excursion; Chest: Nontender, Movement normal; Abdomen: Soft, Nontender, Nondistended, Normal bowel sounds; Genitourinary: No CVA tenderness; Extremities: Peripheral pulses normal, No tenderness, No edema, No calf edema or asymmetry.; Neuro: AA&Ox3, Major CN grossly intact.  Speech clear. No gross focal motor or sensory deficits in extremities.; Skin: Color normal, Warm, Dry.   ED Treatments / Results  Labs (all labs ordered are listed, but only abnormal results are displayed)   EKG EKG Interpretation  Date/Time:  Wednesday May 17 2018 13:00:04 EST Ventricular Rate:  67 PR Interval:    QRS Duration: 96 QT Interval:  381 QTC Calculation: 403 R Axis:   53 Text Interpretation:  Sinus rhythm Baseline wander When compared with ECG of 04/29/2018 No  significant change was found Confirmed by Francine Graven [id number]) on 05/17/2018 5:28:49 PM    EKG Interpretation  Date/Time:  Wednesday May 17 2018 18:20:13 EST Ventricular Rate:  81 PR Interval:    QRS Duration: 86 QT Interval:  374 QTC Calculation: 435 R Axis:   55 Text Interpretation:  Sinus rhythm Since last tracing of earlier today No significant change was found Confirmed by Francine Graven [phone]) on 05/17/2018 6:26:36 PM          Radiology   Procedures Procedures (including critical care time)  Medications Ordered in ED Medications  sodium chloride flush (NS) 0.9 % injection 3 mL (3 mLs Intravenous Given 05/17/18 1745)  ipratropium-albuterol (DUONEB) 0.5-2.5 (3) MG/3ML nebulizer solution 3 mL (3 mLs Nebulization Given 05/17/18 1741)  albuterol (PROVENTIL) (2.5 MG/3ML) 0.083% nebulizer solution 2.5 mg (2.5 mg Nebulization Given 05/17/18 1741)  acetaminophen (TYLENOL) tablet 650 mg (650 mg Oral Given 05/17/18 1741)     Initial Impression / Assessment and Plan / ED Course  I have reviewed the triage vital signs and the nursing notes.  Pertinent labs & imaging results that were available during my care of the patient were reviewed by me and considered in my medical decision making (see chart for details).   MDM Reviewed: previous chart, nursing note and vitals Reviewed previous: labs, ECG and CT scan Interpretation: labs, ECG and x-ray    Results for orders placed or performed during the hospital encounter of 54/65/68  Basic metabolic panel  Result Value Ref Range   Sodium 139 135 - 145 mmol/L   Potassium 4.1 3.5 - 5.1 mmol/L   Chloride 102 98 - 111 mmol/L   CO2 29 22 - 32 mmol/L   Glucose, Bld 187 (H) 70 - 99 mg/dL   BUN 14 8 - 23 mg/dL   Creatinine, Ser 0.77 0.44 - 1.00 mg/dL   Calcium 9.1 8.9 - 10.3 mg/dL   GFR calc non Af Amer >60 >60 mL/min   GFR calc Af Amer >60 >60 mL/min   Anion gap 8 5 - 15  CBC  Result Value Ref Range   WBC 7.8 4.0 -  10.5 K/uL   RBC 4.99 3.87 - 5.11 MIL/uL   Hemoglobin 14.0 12.0 - 15.0 g/dL   HCT 42.8 36.0 - 46.0 %   MCV 85.8 80.0 - 100.0 fL   MCH 28.1 26.0 - 34.0 pg   MCHC 32.7 30.0 - 36.0 g/dL   RDW 13.3 11.5 - 15.5 %   Platelets 220  150 - 400 K/uL   nRBC 0.0 0.0 - 0.2 %  D-dimer, quantitative  Result Value Ref Range   D-Dimer, Quant 0.31 0.00 - 0.50 ug/mL-FEU  Brain natriuretic peptide  Result Value Ref Range   B Natriuretic Peptide 82.9 0.0 - 100.0 pg/mL  POCT i-Stat troponin I  Result Value Ref Range   Troponin i, poc 0.00 0.00 - 0.08 ng/mL   Comment 3          POCT i-Stat troponin I  Result Value Ref Range   Troponin i, poc 0.00 0.00 - 0.08 ng/mL   Comment 3           Dg Chest 2 View Result Date: 05/17/2018 CLINICAL DATA:  Initial evaluation for acute chest pain. EXAM: CHEST - 2 VIEW COMPARISON:  Prior CT from 04/30/2018 FINDINGS: Mild cardiomegaly, stable. Mediastinal silhouette within normal limits. Lungs normally inflated. Mild perihilar vascular congestion without overt pulmonary edema. No infiltrates. No pleural effusion. No pneumothorax. No acute osseous finding IMPRESSION: 1. Cardiomegaly with mild perihilar vascular congestion without overt pulmonary edema. 2. No other active cardiopulmonary disease. Electronically Signed   By: Jeannine Boga M.D.   On: 05/17/2018 13:44    2115:  Doubt PE as cause for symptoms with normal d-dimer and low risk [place].  Doubt ACS as cause for symptoms with normal troponin x2 and unchanged EKG from previous after 12 hours of constant symptoms.  Pt has ambulated with steady gait, easy resps, Sats 93-95% R/A, NAD. Pt states she feels much better after nebs and prednisone and is ready to go home now.  Dx and testing d/w pt.  Questions answered.  Verb understanding, agreeable to d/c home with outpt f/u.      Final Clinical Impressions(s) / ED Diagnoses   Final diagnoses:  None    ED Discharge Orders    None       Francine Graven,  DO 05/19/18 2101

## 2018-05-17 NOTE — ED Notes (Signed)
91% Room Air ambulating

## 2018-05-22 ENCOUNTER — Ambulatory Visit: Payer: Self-pay | Admitting: Internal Medicine

## 2018-05-30 ENCOUNTER — Ambulatory Visit (INDEPENDENT_AMBULATORY_CARE_PROVIDER_SITE_OTHER): Payer: Self-pay | Admitting: Internal Medicine

## 2018-05-30 ENCOUNTER — Other Ambulatory Visit (INDEPENDENT_AMBULATORY_CARE_PROVIDER_SITE_OTHER): Payer: Self-pay

## 2018-05-30 ENCOUNTER — Encounter: Payer: Self-pay | Admitting: Internal Medicine

## 2018-05-30 VITALS — BP 142/92 | HR 61 | Temp 98.6°F | Ht 62.0 in | Wt 199.0 lb

## 2018-05-30 DIAGNOSIS — R197 Diarrhea, unspecified: Secondary | ICD-10-CM | POA: Insufficient documentation

## 2018-05-30 LAB — COMPREHENSIVE METABOLIC PANEL
ALT: 19 U/L (ref 0–35)
AST: 12 U/L (ref 0–37)
Albumin: 4.1 g/dL (ref 3.5–5.2)
Alkaline Phosphatase: 80 U/L (ref 39–117)
BUN: 17 mg/dL (ref 6–23)
CHLORIDE: 99 meq/L (ref 96–112)
CO2: 30 mEq/L (ref 19–32)
Calcium: 9.2 mg/dL (ref 8.4–10.5)
Creatinine, Ser: 0.74 mg/dL (ref 0.40–1.20)
GFR: 78.96 mL/min (ref >60.00)
Glucose, Bld: 137 mg/dL — ABNORMAL HIGH (ref 70–99)
POTASSIUM: 4.3 meq/L (ref 3.5–5.1)
Sodium: 138 mEq/L (ref 135–145)
Total Bilirubin: 0.6 mg/dL (ref 0.2–1.2)
Total Protein: 6.7 g/dL (ref 6.0–8.3)

## 2018-05-30 LAB — LIPASE: Lipase: 23 U/L (ref 11.0–59.0)

## 2018-05-30 LAB — CBC
HEMATOCRIT: 43.5 % (ref 36.0–46.0)
Hemoglobin: 14.7 g/dL (ref 12.0–15.0)
MCHC: 33.9 g/dL (ref 30.0–36.0)
MCV: 82.2 fl (ref 78.0–100.0)
Platelets: 277 10*3/uL (ref 150.0–400.0)
RBC: 5.3 Mil/uL — ABNORMAL HIGH (ref 3.87–5.11)
RDW: 14.7 % (ref 11.5–15.5)
WBC: 9.7 10*3/uL (ref 4.0–10.5)

## 2018-05-30 NOTE — Assessment & Plan Note (Signed)
Concern for C dif given prior infection as well as recent antibiotics including flouroquinolone. Checking CBC, CMP, lipase to rule out dehydration or alternate cause. Checking stool pathogen panel to rule out C dif or alternate etiology of the diarrhea. Encouraged her to push liquids.

## 2018-05-30 NOTE — Addendum Note (Signed)
Addended by: Pricilla Holm A on: 05/30/2018 09:53 AM   Modules accepted: Orders

## 2018-05-30 NOTE — Patient Instructions (Signed)
We will check for C dif and for dehydration.

## 2018-05-30 NOTE — Progress Notes (Signed)
   Subjective:   Patient ID: Katrina [name], female    DOB: Nov 08, 1955, 63 y.o.   MRN: [id number]  HPI The patient is a 63 YO female coming in for diarrhea. Started about 4-5 days ago. She as taken multiple antibiotic courses in the last month including moxifloxacin and doxycycline. Overall the diarrhea is worsening and becoming more often. Currently 4-7 times per day. Denies any blood in the diarrhea. She is having some achiness which is reminiscent of prior episode of C dif which she had last year. She denies nausea or vomiting. Is still eating and drinking liquids normally and has been pushing extra fluids with this.   PMH, [facility], social history reviewed and updated  Review of Systems  Constitutional: Negative.   HENT: Negative.   Eyes: Negative.   Respiratory: Negative for cough, chest tightness and shortness of breath.   Cardiovascular: Negative for chest pain, palpitations and leg swelling.  Gastrointestinal: Positive for abdominal distention and diarrhea. Negative for abdominal pain, anal bleeding, blood in stool, constipation, nausea, rectal pain and vomiting.  Musculoskeletal: Positive for back pain and myalgias.  Skin: Negative.   Neurological: Negative.   Psychiatric/Behavioral: Negative.     Objective:  Physical Exam Constitutional:      Appearance: She is well-developed.  HENT:     Head: Normocephalic and atraumatic.  Neck:     Musculoskeletal: Normal range of motion.  Cardiovascular:     Rate and Rhythm: Normal rate and regular rhythm.  Pulmonary:     Effort: Pulmonary effort is normal. No respiratory distress.     Breath sounds: Normal breath sounds. No wheezing or rales.  Abdominal:     General: Bowel sounds are normal. There is no distension.     Palpations: Abdomen is soft.     Tenderness: There is no abdominal tenderness. There is no guarding or rebound.     Hernia: A hernia is present.  Musculoskeletal:        General: Tenderness present.     Comments:  Diffuse pain in the back   Skin:    General: Skin is warm and dry.  Neurological:     Mental Status: She is alert and oriented to person, place, and time.     Coordination: Coordination normal.     Vitals:   05/30/18 0914  BP: (!) 142/92  Pulse: 61  Temp: 98.6 F (37 C)  TempSrc: Oral  SpO2: 98%  Weight: 199 lb (90.3 kg)  Height: 5\' 2"  (1.575 m)    Assessment & Plan:

## 2018-05-31 ENCOUNTER — Other Ambulatory Visit: Payer: Self-pay

## 2018-05-31 DIAGNOSIS — R197 Diarrhea, unspecified: Secondary | ICD-10-CM

## 2018-06-02 LAB — GASTROINTESTINAL PATHOGEN PANEL PCR
C. difficile Tox A/B, PCR: NOT DETECTED
CAMPYLOBACTER, PCR: NOT DETECTED
Cryptosporidium, PCR: NOT DETECTED
E coli (ETEC) LT/ST PCR: NOT DETECTED
E coli (STEC) stx1/stx2, PCR: NOT DETECTED
E coli 0157, PCR: NOT DETECTED
Giardia lamblia, PCR: NOT DETECTED
Norovirus, PCR: NOT DETECTED
ROTAVIRUS, PCR: NOT DETECTED
Salmonella, PCR: NOT DETECTED
Shigella, PCR: NOT DETECTED

## 2018-06-21 ENCOUNTER — Other Ambulatory Visit: Payer: Self-pay | Admitting: Family

## 2018-06-21 MED ORDER — LEVOTHYROXINE SODIUM 150 MCG PO TABS: 150.0000 ug | ORAL_TABLET | Freq: Every day | ORAL | 0 refills | Status: DC

## 2018-06-21 NOTE — Telephone Encounter (Signed)
Requested Prescriptions  Pending Prescriptions Disp Refills  . levothyroxine (SYNTHROID, LEVOTHROID) 150 MCG tablet 90 tablet 0    Sig: Take 1 tablet (150 mcg total) by mouth daily before breakfast.     Endocrinology:  Hypothyroid Agents Failed - 06/21/2018 12:34 PM      Failed - TSH needs to be rechecked within 3 months after an abnormal result. Refill until TSH is due.      Passed - TSH in normal range and within 360 days    TSH  Date Value Ref Range Status  03/03/2018 1.22 0.35 - 4.50 uIU/mL Final         Passed - Valid encounter within last 12 months    Recent Outpatient Visits          3 weeks ago Diarrhea, unspecified type   [place], Elizabeth A, MD   1 month ago Acute bronchitis, unspecified organism   [place], Delphia Grates, NP   1 month ago Acute bronchitis, unspecified organism   [place], MD   3 months ago Hypothyroidism, unspecified type   [place], Marvis Repress, [place]   4 months ago Other fatigue   [employer] [place], Marvis Repress, [place]      Future Appointments            In 2 months Valere Dross, Marvis Repress, [place] [facility], [facility]

## 2018-06-21 NOTE — Telephone Encounter (Signed)
Copied from [place] [phone]. Topic: Quick Communication - Rx Refill/Question >> Jun 21, 2018 12:28 PM Margot Ables wrote: Medication: levothyroxine (SYNTHROID, LEVOTHROID) 150 MCG tablet - pt notes she has 2 pills left - she contacted the pharmacy and was advised to call her MD office  Has the patient contacted their pharmacy? yes Preferred Pharmacy (with phone number or street name): [facility] - [place], [place] [address] AT [phone] (Phone) [phone] (Fax)

## 2018-06-22 ENCOUNTER — Ambulatory Visit: Payer: Self-pay | Admitting: Internal Medicine

## 2018-06-22 ENCOUNTER — Encounter: Payer: Self-pay | Admitting: *Deleted

## 2018-07-20 ENCOUNTER — Other Ambulatory Visit: Payer: Self-pay | Admitting: Family

## 2018-08-14 ENCOUNTER — Other Ambulatory Visit: Payer: Self-pay

## 2018-08-14 ENCOUNTER — Emergency Department (HOSPITAL_COMMUNITY): Payer: Self-pay

## 2018-08-14 ENCOUNTER — Emergency Department (HOSPITAL_COMMUNITY)
Admission: EM | Admit: 2018-08-14 | Discharge: 2018-08-14 | Disposition: A | Discharge status: Home / Self Care | Payer: Self-pay | Attending: Emergency Medicine | Admitting: Emergency Medicine

## 2018-08-14 ENCOUNTER — Encounter (HOSPITAL_COMMUNITY): Payer: Self-pay | Admitting: Emergency Medicine

## 2018-08-14 ENCOUNTER — Other Ambulatory Visit: Payer: Self-pay | Admitting: Family

## 2018-08-14 ENCOUNTER — Ambulatory Visit: Payer: Self-pay | Admitting: *Deleted

## 2018-08-14 ENCOUNTER — Telehealth: Payer: Self-pay

## 2018-08-14 DIAGNOSIS — Z85840 Personal history of malignant neoplasm of eye: Secondary | ICD-10-CM | POA: Insufficient documentation

## 2018-08-14 DIAGNOSIS — E039 Hypothyroidism, unspecified: Secondary | ICD-10-CM

## 2018-08-14 DIAGNOSIS — E119 Type 2 diabetes mellitus without complications: Secondary | ICD-10-CM | POA: Insufficient documentation

## 2018-08-14 DIAGNOSIS — Z79899 Other long term (current) drug therapy: Secondary | ICD-10-CM | POA: Insufficient documentation

## 2018-08-14 DIAGNOSIS — R0600 Dyspnea, unspecified: Secondary | ICD-10-CM | POA: Insufficient documentation

## 2018-08-14 DIAGNOSIS — I10 Essential (primary) hypertension: Secondary | ICD-10-CM | POA: Insufficient documentation

## 2018-08-14 LAB — BASIC METABOLIC PANEL
Anion gap: 12 (ref 5–15)
BUN: 11 mg/dL (ref 8–23)
CO2: 23 mmol/L (ref 22–32)
Calcium: 9.3 mg/dL (ref 8.9–10.3)
Chloride: 103 mmol/L (ref 98–111)
Creatinine, Ser: 0.66 mg/dL (ref 0.44–1.00)
GFR calc Af Amer: >60 mL/min (ref >60)
GFR calc non Af Amer: >60 mL/min (ref >60)
Glucose, Bld: 159 mg/dL — ABNORMAL HIGH (ref 70–99)
Potassium: 3.9 mmol/L (ref 3.5–5.1)
Sodium: 138 mmol/L (ref 135–145)

## 2018-08-14 LAB — CBC
HCT: 44.7 % (ref 36.0–46.0)
Hemoglobin: 14.7 g/dL (ref 12.0–15.0)
MCH: 28.1 pg (ref 26.0–34.0)
MCHC: 32.9 g/dL (ref 30.0–36.0)
MCV: 85.3 fL (ref 80.0–100.0)
Platelets: 279 10*3/uL (ref 150–400)
RBC: 5.24 MIL/uL — ABNORMAL HIGH (ref 3.87–5.11)
RDW: 13.7 % (ref 11.5–15.5)
WBC: 8.6 10*3/uL (ref 4.0–10.5)
nRBC: 0 % (ref 0.0–0.2)

## 2018-08-14 LAB — BRAIN NATRIURETIC PEPTIDE: B Natriuretic Peptide: 29.2 pg/mL (ref 0.0–100.0)

## 2018-08-14 LAB — D-DIMER, QUANTITATIVE: D-Dimer, Quant: 0.33 ug/mL-FEU (ref 0.00–0.50)

## 2018-08-14 LAB — TROPONIN I: Troponin I: <0.03 ng/mL (ref <0.03)

## 2018-08-14 MED ORDER — SUCRALFATE 1 G PO TABS: 1.0000 g | ORAL_TABLET | Freq: Three times a day (TID) | ORAL | 1 refills | Status: DC

## 2018-08-14 NOTE — ED Triage Notes (Signed)
Pt c/o SOB for 4 days with chest tightness. Reports had PNA back in jan. Denies cough.

## 2018-08-14 NOTE — Discharge Instructions (Signed)
Take the medications as prescribed, follow-up with your primary care doctor to make sure your symptoms are improving, return as needed for worsening symptoms.  Increase your Prilosec to 20 mg twice daily.  Also start taking the Carafate

## 2018-08-14 NOTE — ED Notes (Signed)
Pt ambulatory to restroom. Provided cup for UA

## 2018-08-14 NOTE — ED Provider Notes (Signed)
[facility] DEPT Provider Note   CSN: [id number] Arrival date & time: 08/14/18  1212    History   Chief Complaint Chief Complaint  Patient presents with  . Shortness of Breath  . Chest Pain    HPI Katrina [name] is a 63 y.o. female.     HPI Patient presents to the emergency room for evaluation of shortness of breath.  Patient states she had pneumonia back in January.  She seemed to recover but at least over the last months she has had some intermittent episodes of shortness of breath.  Patient states is getting worse over the last 4 days.  She will have episodes where her chest feels tight and she will start to feel short of breath.  Sometimes not always this is occurring with activity.  She is not having any chest pain.  She denies any fevers.  She denies any leg swelling.  No myalgias.  No coughing.  Patient does have a history of asthma but is not sure that she is really having any wheezing with these episodes. Past Medical History:  Diagnosis Date  . BCC (basal cell carcinoma of skin) Infundibulocystic 03/11/2008   Inner Left Eye  . Chest pain at rest 11/07/2012  . Diabetes mellitus without complication ([place])   . Eosinophilic esophagitis 38/25/0539  . Esophageal stricture   . Exercise-induced asthma   . GERD (gastroesophageal reflux disease)   . Hypertension 09/27/2017  . Hypothyroidism   . Sarcoidosis   . Shortness of breath 11/08/2012   RECENTLY  & WHEN i EXERCISE"  . Solar lentigo 03/11/2008   Back - Atypical  . Thyroid disease   . Upper airway cough syndrome     Patient Active Problem List   Diagnosis Date Noted  . Diarrhea 05/30/2018  . Esophageal stricture   . Acute hypoxemic respiratory failure ([place])   . Obesity (BMI 30.0-34.9)   . CAP (community acquired pneumonia) 04/30/2018  . Lobar pneumonia ([place])   . Hypertension 09/27/2017  . 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 Low back pain associated with a spinal disorder other  than radiculopathy or spinal stenosis 03/01/2016  . Myalgia 10/09/2015  . Upper airway cough syndrome 01/26/2015  . Obesity (BMI 30-39.9) 01/26/2015  . Pulmonary nodules   . DOE (dyspnea on exertion) 01/01/2015  . Pulmonary sarcoidosis ([place])   . Mediastinal lymphadenopathy 12/26/2014  . Esophageal ring, acquired 11/10/2012  . Dysphagia, unspecified(787.20) 11/09/2012  . Hypothyroidism 11/08/2012  . 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 Hypothyroidism, acquired 12/28/2007  . GERD 12/28/2007  . 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 ABDOMINAL HYSTERECTOMY     Partial  . ESOPHAGOGASTRODUODENOSCOPY (EGD) WITH ESOPHAGEAL DILATION N/A 11/10/2012   Procedure: ESOPHAGOGASTRODUODENOSCOPY (EGD) WITH ESOPHAGEAL DILATION;  Surgeon: Gatha Mayer, MD;  Location: [facility];  Service: Endoscopy;  Laterality: N/A;  [place] vs. Balloon  . VIDEO BRONCHOSCOPY WITH ENDOBRONCHIAL ULTRASOUND Bilateral 01/15/2015   Procedure: VIDEO BRONCHOSCOPY WITH ENDOBRONCHIAL ULTRASOUND of  LEFT LUNG LOWER LOBE and right lung;  Surgeon: Collene Gobble, MD;  Location: [facility];  Service: Thoracic;  Laterality: Bilateral;     OB History   No obstetric history on file.      Home Medications    Prior to Admission medications   Medication Sig Start Date End Date Taking? Authorizing Provider  albuterol (PROVENTIL HFA;VENTOLIN HFA) 108 (90 Base) MCG/ACT inhaler  Inhale 2 puffs into the lungs every 4 (four) hours as needed for wheezing or shortness of breath. 05/17/18  Yes Francine Graven, DO  aspirin-acetaminophen-caffeine (EXCEDRIN MIGRAINE) [phone] MG tablet Take 1 tablet by mouth every 6 (six) hours as needed for headache.   Yes [provider]  ibuprofen (ADVIL) 200 MG tablet Take 200 mg by mouth every 6 (six) hours as needed for headache or mild pain.   Yes [provider]  levothyroxine (SYNTHROID)  137 MCG tablet TAKE 1 TABLET BY MOUTH EVERY MORNING ON AN EMPTY STOMACH Patient taking differently: Take 137 mcg by mouth daily before breakfast. TAKE 1 TABLET BY MOUTH EVERY MORNING ON AN EMPTY STOMACH 07/21/18  Yes Marrian Salvage, FNP  omeprazole (PRILOSEC) 20 MG capsule Take 20 mg by mouth daily.   Yes [provider]  polyvinyl alcohol (LIQUIFILM TEARS) 1.4 % ophthalmic solution Place 1 drop into both eyes as needed for dry eyes (itching/allergies).   Yes [provider]  saccharomyces boulardii (FLORASTOR) 250 MG capsule Take 1 capsule (250 mg total) by mouth 2 (two) times daily. Patient taking differently: Take 250 mg by mouth daily.  05/02/18  Yes Florencia Reasons, MD  albuterol (PROVENTIL HFA;VENTOLIN HFA) 108 (90 Base) MCG/ACT inhaler Inhale 1-2 puffs into the lungs every 6 (six) hours as needed for wheezing or shortness of breath. Patient not taking: Reported on 08/14/2018 04/24/18   Binnie Rail, MD  guaiFENesin (MUCINEX) 600 MG 12 hr tablet Take 1 tablet (600 mg total) by mouth 2 (two) times daily. Patient not taking: Reported on 05/30/2018 05/02/18   Florencia Reasons, MD  levothyroxine (SYNTHROID, LEVOTHROID) 150 MCG tablet Take 1 tablet (150 mcg total) by mouth daily before breakfast. Patient not taking: Reported on 08/14/2018 06/21/18   Marrian Salvage, FNP  sucralfate (CARAFATE) 1 g tablet Take 1 tablet (1 g total) by mouth 4 (four) times daily -  with meals and at bedtime. 08/14/18   Dorie Rank, MD    Family History Family History  Problem Relation Age of Onset  . Diabetes Mother   . Stroke Mother   . Coronary artery disease Father   . Coronary artery disease Brother   . Hypothyroidism Maternal Grandmother   . Diabetes Maternal Grandfather   . Heart disease Paternal Grandmother   . Heart disease Paternal Grandfather     Social History Social History   Tobacco Use  . Smoking status: Never Smoker  . Smokeless tobacco: Never Used  Substance Use Topics  .  Alcohol use: No  . Drug use: No     Allergies   Penicillins and Bactrim ds [sulfamethoxazole-trimethoprim]   Review of Systems Review of Systems  All other systems reviewed and are negative.    Physical Exam Updated Vital Signs BP (!) 182/93   Pulse 70   Temp 97.7 F (36.5 C) (Oral)   Resp 16   Ht 1.575 m (5\' 2" )   SpO2 91%   BMI 36.40 kg/m   Physical Exam Vitals signs and nursing note reviewed.  Constitutional:      General: She is not in acute distress.    Appearance: She is well-developed.  HENT:     Head: Normocephalic and atraumatic.     Right Ear: External ear normal.     Left Ear: External ear normal.  Eyes:     General: No scleral icterus.       Right eye: No discharge.        Left eye:  No discharge.     Conjunctiva/sclera: Conjunctivae normal.  Neck:     Musculoskeletal: Neck supple.     Trachea: No tracheal deviation.  Cardiovascular:     Rate and Rhythm: Normal rate and regular rhythm.  Pulmonary:     Effort: Pulmonary effort is normal. No respiratory distress.     Breath sounds: Normal breath sounds. No stridor. No wheezing or rales.  Abdominal:     General: Bowel sounds are normal. There is no distension.     Palpations: Abdomen is soft.     Tenderness: There is no abdominal tenderness. There is no guarding or rebound.  Musculoskeletal:        General: No tenderness.  Skin:    General: Skin is warm and dry.     Findings: No rash.  Neurological:     Mental Status: She is alert.     Cranial Nerves: No cranial nerve deficit (no facial droop, extraocular movements intact, no slurred speech).     Sensory: No sensory deficit.     Motor: No abnormal muscle tone or seizure activity.     Coordination: Coordination normal.      ED Treatments / Results  Labs (all labs ordered are listed, but only abnormal results are displayed) Labs Reviewed  CBC - Abnormal; Notable for the following components:      Result Value   RBC 5.24 (*)    All other  components within normal limits  BASIC METABOLIC PANEL - Abnormal; Notable for the following components:   Glucose, Bld 159 (*)    All other components within normal limits  BRAIN NATRIURETIC PEPTIDE  TROPONIN I  D-DIMER, QUANTITATIVE (NOT AT [facility])    EKG EKG Interpretation  Date/Time:  Monday August 14 2018 12:24:07 EDT Ventricular Rate:  73 PR Interval:    QRS Duration: 94 QT Interval:  372 QTC Calculation: 410 R Axis:   52 Text Interpretation:  Sinus rhythm Minimal ST depression, lateral leads Baseline wander in lead(s) II III aVL aVF V2 V6 No significant change since last tracing Confirmed by Dorie Rank [phone]) on 08/14/2018 12:26:08 PM   Radiology Dg Chest Portable 1 View  Result Date: 08/14/2018 CLINICAL DATA:  Shortness of breath beginning 2 weeks ago which is worsening. History of asthma. EXAM: PORTABLE CHEST 1 VIEW COMPARISON:  05/17/2018 FINDINGS: Artifact overlies the chest. Heart size upper limits of normal. Tortuosity of the aorta. The lungs appear clear today. Interstitial prominence seen previously has resolved. No sign of infiltrate, collapse or effusion. No significant bone finding. IMPRESSION: No active disease. Electronically Signed   By: Nelson Chimes M.D.   On: 08/14/2018 14:18    Procedures Procedures (including critical care time)  Medications Ordered in ED Medications - No data to display   Initial Impression / Assessment and Plan / ED Course  I have reviewed the triage vital signs and the nursing notes.  Pertinent labs & imaging results that were available during my care of the patient were reviewed by me and considered in my medical decision making (see chart for details).   Patient presented to the emergency room for evaluation of that has been going on for at least a month or so.  Patient states some of her symptoms have been with exertion however she is also noticed some of it being related to eating.  Occasionally when she is swallowing she has some  discomfort in her chest.  Patient does have a history of gastroesophageal reflux.  Is possible her  symptoms could be related to that.  Patient symptoms are atypical for ACS.  Her troponin is normal.  EKG is reassuring.  I doubt acute coronary syndrome although if her symptoms persist could consider outpatient stress test.  No signs to suggest PE pneumonia or CHF.  No wheezing here in the ED but it is possible she could be having bronchospasm.  Plan on discharge home with increasing her Prilosec and Carafate.  Discussed outpatient follow-up with her primary doctor  Final Clinical Impressions(s) / ED Diagnoses   Final diagnoses:  Dyspnea, unspecified type    ED Discharge Orders         Ordered    sucralfate (CARAFATE) 1 g tablet  3 times daily with meals & bedtime     08/14/18 1543           Dorie Rank, MD 08/14/18 1545

## 2018-08-14 NOTE — Telephone Encounter (Signed)
I put in order for thyroid level; let's check on her tomorrow and let her know about this; she is going to [facility] now for asthma attack.

## 2018-08-14 NOTE — Telephone Encounter (Signed)
I need to clarify what labs she wants done. I haven't seen her since November. Her thyroid labs?

## 2018-08-14 NOTE — Telephone Encounter (Signed)
Copied from [place] [id number]. Topic: Appointment Scheduling - Scheduling Inquiry for Clinic >> Aug 11, 2018  2:53 PM Scherrie Gerlach wrote: Reason for CRM: pt calling back because she got about doing VV, however, carla was going to ask [name] about lab work for pt, and laura out this week. Pt aware she will get a call back next week.

## 2018-08-14 NOTE — Telephone Encounter (Signed)
Pt calling with shortness of breath. This started about 2 weeks ago when she was trying to walk and now over the last 4 days it has gotten worst.  Has hx of asthma. She is not wheezing, no coughing. She had been in the hospital with pneumonia in January and had the symptoms of the coronavirus then but was never checked.  Her oxygen level dropped and she had to stay for about 4 days. Also received respiratory treatments there. She has an inhaler, that helps some. Woke up about 3 am and could not get her breath this morning.  She wears a mask outside of her house but can not breath while wearing one.  Per protocol, she should be seen in the  ED, ;pt voiced understanding. She will be going to [facility]. CN at [facility] notified that this patient does not have covid-19 symptoms. Flow at [facility] at [facility], notified that pt is going to the ED. Triage routing to flow at [facility].  Reason for Disposition . Asthma medicine (nebulizer or inhaler) is needed more frequently than q 4 hours to keep you comfortable  Answer Assessment - Initial Assessment Questions 1. RESPIRATORY STATUS: "Describe your breathing?" (e.g., wheezing, shortness of breath, unable to speak, severe coughing)      Shortness of breath 2. ONSET: "When did this asthma attack begin?"      About 4 days ago (2 weeks ago when trying to walk) 3. TRIGGER: "What do you think triggered this attack?" (e.g., URI, exposure to pollen or other allergen, tobacco smoke)      ?pollen, meat or chunky food, or cleaning products or sanitizer 4. PEAK EXPIRATORY FLOW RATE (PEFR): "Do you use a peak flow meter?" If so, ask: "What's the current peak flow? What's your personal best peak flow?"      no 5. SEVERITY: "How bad is this attack?"    - MILD: No SOB at rest, mild SOB with walking, speaks normally in sentences, can lay down, no retractions, pulse < 100. (GREEN Zone: PEFR 80-100%)   - MODERATE: SOB at rest, SOB with minimal exertion and prefers to sit, cannot  lie down flat, speaks in phrases, mild retractions, audible wheezing, pulse 100-120. (YELLOW Zone: PEFR 50-80%)    - SEVERE: Very SOB at rest, speaks in single words, struggling to breathe, sitting hunched forward, retractions, usually loud wheezing, sometimes minimal wheezing because of decreased air movement, pulse > 120. (RED Zone: PEFR < 50%).      moderate 6. MEDICATIONS (Inhaler or nebs): "What are your asthma medications?" and "What treatments have you given so far?"    - Quick-relief: albuterol, metaproterenol, salbutamol, or other inhaled or nebulized beta-agonist medicines   - Long-term-control: steroids, cromolyn, or other anti-inflammatory medicines.     Inhaler  7. OTHER SYMPTOMS: "Do you have any other symptoms? (e.g., runny nose, chest pain, fever)     Last night, chest was uncomfortable not painful 8. PREGNANCY: "Is there any chance you are pregnant?" "When was your last menstrual period?"     n/a  Protocols used: ASTHMA ATTACK-A-AH

## 2018-08-14 NOTE — Telephone Encounter (Signed)
Pt states it is her Thyroid labs she needs drawn.

## 2018-08-15 NOTE — Telephone Encounter (Signed)
Spoke with patient. She will get labs done tomorrow and VV has been moved up to 08/18/18.  She is feeling much better today as well.

## 2018-08-16 ENCOUNTER — Other Ambulatory Visit (INDEPENDENT_AMBULATORY_CARE_PROVIDER_SITE_OTHER): Payer: Self-pay

## 2018-08-16 DIAGNOSIS — E039 Hypothyroidism, unspecified: Secondary | ICD-10-CM

## 2018-08-16 LAB — TSH: TSH: 4.3 u[IU]/mL (ref 0.35–4.50)

## 2018-08-18 ENCOUNTER — Ambulatory Visit (INDEPENDENT_AMBULATORY_CARE_PROVIDER_SITE_OTHER): Payer: Self-pay | Admitting: Family

## 2018-08-18 DIAGNOSIS — E039 Hypothyroidism, unspecified: Secondary | ICD-10-CM

## 2018-08-18 DIAGNOSIS — I10 Essential (primary) hypertension: Secondary | ICD-10-CM

## 2018-08-18 MED ORDER — LOSARTAN POTASSIUM 50 MG PO TABS: 50.0000 mg | ORAL_TABLET | Freq: Every day | ORAL | 0 refills | Status: DC

## 2018-08-18 MED ORDER — LEVOTHYROXINE SODIUM 150 MCG PO TABS: 150.0000 ug | ORAL_TABLET | Freq: Every day | ORAL | 0 refills | Status: DC

## 2018-08-18 NOTE — Progress Notes (Signed)
Katrina [name] is a 63 y.o. female with the following history as recorded in EpicCare:  Patient Active Problem List   Diagnosis Date Noted  . Diarrhea 05/30/2018  . Esophageal stricture   . Acute hypoxemic respiratory failure ([place])   . Obesity (BMI 30.0-34.9)   . CAP (community acquired pneumonia) 04/30/2018  . Lobar pneumonia ([place])   . Hypertension 09/27/2017  . 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 Low back pain associated with a spinal disorder other than radiculopathy or spinal stenosis 03/01/2016  . Myalgia 10/09/2015  . Upper airway cough syndrome 01/26/2015  . Obesity (BMI 30-39.9) 01/26/2015  . Pulmonary nodules   . DOE (dyspnea on exertion) 01/01/2015  . Pulmonary sarcoidosis ([place])   . Mediastinal lymphadenopathy 12/26/2014  . Esophageal ring, acquired 11/10/2012  . Dysphagia, unspecified(787.20) 11/09/2012  . Hypothyroidism 11/08/2012  . 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 Hypothyroidism, acquired 12/28/2007  . GERD 12/28/2007  . 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 albuterol (PROVENTIL HFA;VENTOLIN HFA) 108 (90 Base) MCG/ACT inhaler Inhale 1-2 puffs into the lungs every 6 (six) hours as needed for wheezing or shortness of breath. (Patient not taking: Reported on 08/14/2018) 1 Inhaler 0  . albuterol (PROVENTIL HFA;VENTOLIN HFA) 108 (90 Base) MCG/ACT inhaler Inhale 2 puffs into the lungs every 4 (four) hours as needed for wheezing or shortness of breath. 1 Inhaler 0  . aspirin-acetaminophen-caffeine (EXCEDRIN MIGRAINE) 250-250-65 MG tablet Take 1 tablet by mouth every 6 (six) hours as needed for headache.    . guaiFENesin (MUCINEX) 600 MG 12 hr tablet Take 1 tablet (600 mg total) by mouth 2 (two) times daily. (Patient not taking: Reported on 05/30/2018) 60 tablet 0  . ibuprofen (ADVIL) 200 MG tablet Take 200 mg by mouth every 6 (six) hours as needed for  headache or mild pain.    Marland Kitchen levothyroxine (SYNTHROID) 150 MCG tablet Take 1 tablet (150 mcg total) by mouth daily before breakfast. 90 tablet 0  . omeprazole (PRILOSEC) 20 MG capsule Take 20 mg by mouth daily.    . polyvinyl alcohol (LIQUIFILM TEARS) 1.4 % ophthalmic solution Place 1 drop into both eyes as needed for dry eyes (itching/allergies).    . saccharomyces boulardii (FLORASTOR) 250 MG capsule Take 1 capsule (250 mg total) by mouth 2 (two) times daily. (Patient taking differently: Take 250 mg by mouth daily. ) 60 capsule 0  . sucralfate (CARAFATE) 1 g tablet Take 1 tablet (1 g total) by mouth 4 (four) times daily -  with meals and at bedtime. 30 tablet 1   No current facility-administered medications for this visit.     Allergies: Penicillins and Bactrim ds [sulfamethoxazole-trimethoprim]  Past Medical History:  Diagnosis Date  . BCC (basal cell carcinoma of skin) Infundibulocystic 03/11/2008   Inner Left Eye  . Chest pain at rest 11/07/2012  . Diabetes mellitus without complication ([place])   . Eosinophilic esophagitis 41/93/7902  . Esophageal stricture   . Exercise-induced asthma   . GERD (gastroesophageal reflux disease)   . Hypertension 09/27/2017  . Hypothyroidism   . Sarcoidosis   . Shortness of breath 11/08/2012   RECENTLY  & WHEN i EXERCISE"  . Solar lentigo 03/11/2008   Back - Atypical  . Thyroid disease   . Upper airway cough syndrome     Past Surgical History:  Procedure Laterality Date  . ABDOMINAL HYSTERECTOMY  Partial  . ESOPHAGOGASTRODUODENOSCOPY (EGD) WITH ESOPHAGEAL DILATION N/A 11/10/2012   Procedure: ESOPHAGOGASTRODUODENOSCOPY (EGD) WITH ESOPHAGEAL DILATION;  Surgeon: Gatha Mayer, MD;  Location: [facility];  Service: Endoscopy;  Laterality: N/A;  [place] vs. Balloon  . VIDEO BRONCHOSCOPY WITH ENDOBRONCHIAL ULTRASOUND Bilateral 01/15/2015   Procedure: VIDEO BRONCHOSCOPY WITH ENDOBRONCHIAL ULTRASOUND of  LEFT LUNG LOWER LOBE and right lung;  Surgeon:  Collene Gobble, MD;  Location: [facility];  Service: Thoracic;  Laterality: Bilateral;    Family History  Problem Relation Age of Onset  . Diabetes Mother   . Stroke Mother   . Coronary artery disease Father   . Coronary artery disease Brother   . Hypothyroidism Maternal Grandmother   . Diabetes Maternal Grandfather   . Heart disease Paternal Grandmother   . Heart disease Paternal Grandfather     Social History   Tobacco Use  . Smoking status: Never Smoker  . Smokeless tobacco: Never Used  Substance Use Topics  . Alcohol use: No    Subjective:    I connected with Grayce Sessions on 08/18/18 at  1:20 PM EDT by a video enabled telemedicine application and verified that I am speaking with the correct person using two identifiers. Patient and I are the only 2 people on the video visit.    I discussed the limitations of evaluation and management by telemedicine and the availability of in person appointments. The patient expressed understanding and agreed to proceed.  6 month follow-up on hypothyroidism; does feel that dosage needs to be adjusted; wonders if she could be increased to 150 mcg;  Also went to ER earlier this week with DOE; exam there was unremarkable but blood pressure was very high; was referred to cardiology last fall with similar symptoms but appointment has not been completed; she is planning to do this once COVID restrictions are lifted. Denies any chest pain on exertion but has been having increased shortness of breath; not able to exercise regularly due to current COVID restrictions and feels that her weight is going back up.    Objective:  There were no vitals filed for this visit.  General: Well developed, well nourished, in no acute distress  Head: Normocephalic and atraumatic  Lungs: Respirations unlabored;  Neurologic: Alert and oriented; speech intact; face symmetrical; moves all extremities well; CNII-XII intact without focal deficit    Assessment:  1.  Hypothyroidism, unspecified type   2. Hypertension, unspecified type     Plan:  1. Reviewed recent TSH with patient; agree to increase Synthroid to 150 mcg; re-check in 1 month; 2. Trial of Losartan 50 mg daily; patient is encouraged to purchase home blood cuff and start checking her pressure regularly; follow-up in 1 month.    No follow-ups on file.  No orders of the defined types were placed in this encounter.   Requested Prescriptions   Signed Prescriptions Disp Refills  . levothyroxine (SYNTHROID) 150 MCG tablet 90 tablet 0    Sig: Take 1 tablet (150 mcg total) by mouth daily before breakfast.

## 2018-08-21 ENCOUNTER — Ambulatory Visit: Payer: Self-pay

## 2018-08-21 ENCOUNTER — Telehealth: Payer: Self-pay | Admitting: Family

## 2018-08-21 ENCOUNTER — Ambulatory Visit: Payer: Self-pay | Admitting: Neurology

## 2018-08-21 NOTE — Telephone Encounter (Signed)
Patient can't figure any other reason for causing leg/hip pain---the pain was very severe for a few hours after she took the first Losartan tablet---then pain will go away, but it will start again the next day, also very close to med administration time---patient states she really didn't want to buy something else right now, in case it's something else causing leg pain that she just hasnt figured out yet, so she wants to keep taking losartan---her bp readings are responding well to medication---she will continue med until the end of this week (Friday, 08/25/18)---if leg/hip pain persists, she will call back to either have video visit with dr burns or can have visit with laura murray,NP on Monday 08/28/18 (when laura returns to office)

## 2018-08-21 NOTE — Telephone Encounter (Signed)
Routing to dr burns---patient started losartan on 08/18/18---office visit with laura-----please advise in the absence of laura this week----thanks

## 2018-08-21 NOTE — Telephone Encounter (Signed)
Not likely related to the losartan -- any other causes?  We can change it but that is not likely from the losartan.

## 2018-08-21 NOTE — Telephone Encounter (Signed)
Copied from [place] [phone]. Topic: Quick Communication - Rx Refill/Question >> Aug 21, 2018 11:28 AM Alanda Slim E wrote: Medication: Pt has started taking losartan (COZAAR) 50 MG tablet and states that she thinks it is causing hip and leg pain/ please advise

## 2018-08-21 NOTE — Telephone Encounter (Signed)
Noted  

## 2018-08-21 NOTE — Telephone Encounter (Signed)
Patient called and says her BP at 1430- 146/91; [id number]; 1745-150/95. She says she feels like her heart is beating fast and hard. I asked for her last HR at 1745, she said 6. She says she was having pain in her hip while taking Losartan, talked to the nurse in the office about it today and she says [name] said she's never heard about pain while on Losartan. She says today at 1515, she took Lisinopril 10 mg, a prescription she had last year, then took 1/2 Losartan at 1715. She's asking about drug interaction with taking both of those. She appears anxious on the phone. I advised first of all her HR is normal and not fast, that the beating hard is more than likely her worrying about the BP medications and interactions. I advised I am not the expert on drug interactions and she can call the pharmacist to ask, but advised if there was going to be a reaction, with it already being 45 minutes since taking the Losartan, it would be happening now. I advised to relax, take her BP again in a couple of hours and if it's above the last BP reading of 150/95 and she's having symptoms, then she should be concerned and go to the ED, she verbalized understanding. I advised I will send this over to the office and someone will call back with Carolyne Fiscal recommendation. She asked was it ok to take those medications like she took them, I advised I cannot say whether it's ok to take or not take, but she should follow the medication dosages as outlined by her provider and not take anything not prescribed, she verbalized understanding.  Reason for Disposition . [9] Systolic BP  >= 024 OR Diastolic >= 80 AND [0] taking BP medications  Answer Assessment - Initial Assessment Questions 1. BLOOD PRESSURE: "What is the blood pressure?" "Did you take at least two measurements 5 minutes apart?"     1430-146/91; 9735-329/92; 1745-150/95 2. ONSET: "When did you take your blood pressure?"     Today 3. HOW: "How did you obtain the  blood pressure?" (e.g., visiting nurse, automatic home BP monitor)     Automatic home BP monitor 4. HISTORY: "Do you have a history of high blood pressure?"     Yes 5. MEDICATIONS: "Are you taking any medications for blood pressure?" "Have you missed any doses recently?"     Yes, didn't take Losartan yesterday 6. OTHER SYMPTOMS: "Do you have any symptoms?" (e.g., headache, chest pain, blurred vision, difficulty breathing, weakness)     Feels like my heart beating hard and fast 7. PREGNANCY: "Is there any chance you are pregnant?" "When was your last menstrual period?"     No  Protocols used: HIGH BLOOD PRESSURE-A-AH

## 2018-08-22 ENCOUNTER — Ambulatory Visit: Payer: Self-pay | Admitting: Internal Medicine

## 2018-08-22 ENCOUNTER — Other Ambulatory Visit (INDEPENDENT_AMBULATORY_CARE_PROVIDER_SITE_OTHER): Payer: Self-pay

## 2018-08-22 ENCOUNTER — Other Ambulatory Visit: Payer: Self-pay

## 2018-08-22 ENCOUNTER — Encounter: Payer: Self-pay | Admitting: Internal Medicine

## 2018-08-22 ENCOUNTER — Ambulatory Visit (INDEPENDENT_AMBULATORY_CARE_PROVIDER_SITE_OTHER): Payer: Self-pay | Admitting: Internal Medicine

## 2018-08-22 VITALS — BP 144/76 | HR 75 | Temp 98.3°F | Resp 16 | Ht 62.0 in | Wt 203.0 lb

## 2018-08-22 DIAGNOSIS — I10 Essential (primary) hypertension: Secondary | ICD-10-CM

## 2018-08-22 DIAGNOSIS — R9431 Abnormal electrocardiogram [ECG] [EKG]: Secondary | ICD-10-CM | POA: Insufficient documentation

## 2018-08-22 DIAGNOSIS — E118 Type 2 diabetes mellitus with unspecified complications: Secondary | ICD-10-CM

## 2018-08-22 DIAGNOSIS — E785 Hyperlipidemia, unspecified: Secondary | ICD-10-CM

## 2018-08-22 DIAGNOSIS — R7303 Prediabetes: Secondary | ICD-10-CM | POA: Insufficient documentation

## 2018-08-22 DIAGNOSIS — R0609 Other forms of dyspnea: Secondary | ICD-10-CM

## 2018-08-22 DIAGNOSIS — E1169 Type 2 diabetes mellitus with other specified complication: Secondary | ICD-10-CM | POA: Insufficient documentation

## 2018-08-22 HISTORY — DX: Type 2 diabetes mellitus with unspecified complications: E11.8

## 2018-08-22 LAB — LIPID PANEL
Cholesterol: 141 mg/dL (ref 0–200)
HDL: 27 mg/dL — ABNORMAL LOW (ref >39.00)
NonHDL: 113.77
Total CHOL/HDL Ratio: 5
Triglycerides: 268 mg/dL — ABNORMAL HIGH (ref 0.0–149.0)
VLDL: 53.6 mg/dL — ABNORMAL HIGH (ref 0.0–40.0)

## 2018-08-22 LAB — BASIC METABOLIC PANEL
BUN: 13 mg/dL (ref 6–23)
CO2: 27 mEq/L (ref 19–32)
Calcium: 9.9 mg/dL (ref 8.4–10.5)
Chloride: 102 mEq/L (ref 96–112)
Creatinine, Ser: 0.7 mg/dL (ref 0.40–1.20)
GFR: 84.13 mL/min (ref >60.00)
Glucose, Bld: 108 mg/dL — ABNORMAL HIGH (ref 70–99)
Potassium: 4.3 mEq/L (ref 3.5–5.1)
Sodium: 137 mEq/L (ref 135–145)

## 2018-08-22 LAB — LDL CHOLESTEROL, DIRECT: Direct LDL: 83 mg/dL

## 2018-08-22 LAB — HEMOGLOBIN A1C: Hgb A1c MFr Bld: 6.8 % — ABNORMAL HIGH (ref 4.6–6.5)

## 2018-08-22 LAB — TROPONIN I: TNIDX: 0.01 ug/l (ref 0.00–0.06)

## 2018-08-22 MED ORDER — ASPIRIN EC 81 MG PO TBEC: 81.0000 mg | DELAYED_RELEASE_TABLET | Freq: Every day | ORAL | 1 refills | Status: DC

## 2018-08-22 MED ORDER — ROSUVASTATIN CALCIUM 10 MG PO TABS: 10.0000 mg | ORAL_TABLET | Freq: Every day | ORAL | 1 refills | Status: DC

## 2018-08-22 NOTE — Patient Instructions (Signed)

## 2018-08-22 NOTE — Progress Notes (Signed)
Subjective:  Patient ID: Katrina [name], female    DOB: [birth year]  Age: 63 y.o. MRN: [id number]  CC: Hypertension and Diabetes  NEW TO ME  HPI Katrina [name] presents for concerns about dyspnea on exertion.  She tells me she has not felt well for the last 4 months after being treated for influenza and pneumonia.  She has had some degree of DOE and SOB since then.  She has a significant family history with 2 brothers that have CAD.  She denies chest pain, diaphoresis, dizziness, or lightheadedness.  She was seen in the ED about a week ago and labs and chest x-ray were normal.  Her EKG had nonspecific abnormalities.  She was also found to be hypertensive.  Losartan has been added and her blood pressure is improving.  Outpatient Medications Prior to Visit  Medication Sig Dispense Refill  . albuterol (PROVENTIL HFA;VENTOLIN HFA) 108 (90 Base) MCG/ACT inhaler Inhale 1-2 puffs into the lungs every 6 (six) hours as needed for wheezing or shortness of breath. 1 Inhaler 0  . albuterol (PROVENTIL HFA;VENTOLIN HFA) 108 (90 Base) MCG/ACT inhaler Inhale 2 puffs into the lungs every 4 (four) hours as needed for wheezing or shortness of breath. 1 Inhaler 0  . aspirin-acetaminophen-caffeine (EXCEDRIN MIGRAINE) 250-250-65 MG tablet Take 1 tablet by mouth every 6 (six) hours as needed for headache.    . guaiFENesin (MUCINEX) 600 MG 12 hr tablet Take 1 tablet (600 mg total) by mouth 2 (two) times daily. 60 tablet 0  . ibuprofen (ADVIL) 200 MG tablet Take 200 mg by mouth every 6 (six) hours as needed for headache or mild pain.    Marland Kitchen levothyroxine (SYNTHROID) 150 MCG tablet Take 1 tablet (150 mcg total) by mouth daily before breakfast. 90 tablet 0  . losartan (COZAAR) 50 MG tablet Take 1 tablet (50 mg total) by mouth daily. 90 tablet 0  . omeprazole (PRILOSEC) 20 MG capsule Take 20 mg by mouth daily.    . polyvinyl alcohol (LIQUIFILM TEARS) 1.4 % ophthalmic solution Place 1 drop into both eyes as needed for dry eyes  (itching/allergies).    . saccharomyces boulardii (FLORASTOR) 250 MG capsule Take 1 capsule (250 mg total) by mouth 2 (two) times daily. (Patient taking differently: Take 250 mg by mouth daily. ) 60 capsule 0  . sucralfate (CARAFATE) 1 g tablet Take 1 tablet (1 g total) by mouth 4 (four) times daily -  with meals and at bedtime. 30 tablet 1   No facility-administered medications prior to visit.     ROS Review of Systems  Constitutional: Positive for fatigue and unexpected weight change (wt gain). Negative for diaphoresis.  HENT: Negative.   Eyes: Negative.  Negative for visual disturbance.  Respiratory: Positive for shortness of breath. Negative for apnea, cough and wheezing.   Cardiovascular: Negative for chest pain, palpitations and leg swelling.  Gastrointestinal: Negative for abdominal pain, diarrhea, nausea and vomiting.  Endocrine: Negative for cold intolerance, heat intolerance, polydipsia, polyphagia and polyuria.  Genitourinary: Negative.  Negative for difficulty urinating.  Musculoskeletal: Negative.  Negative for arthralgias and myalgias.  Skin: Negative.   Neurological: Negative.  Negative for dizziness and light-headedness.  Hematological: Negative for adenopathy. Does not bruise/bleed easily.  Psychiatric/Behavioral: Negative.     Objective:  BP (!) 144/76 (BP Location: Left Arm, Patient Position: Sitting, Cuff Size: Large)   Pulse 75   Temp 98.3 F (36.8 C) (Oral)   Resp 16   Ht 5\' 2"  (1.575 m)  Wt 203 lb (92.1 kg)   SpO2 97%   BMI 37.13 kg/m   BP Readings from Last 3 Encounters:  08/22/18 (!) 144/76  08/14/18 (!) 182/93  05/30/18 (!) 142/92    Wt Readings from Last 3 Encounters:  08/22/18 203 lb (92.1 kg)  05/30/18 199 lb (90.3 kg)  05/17/18 195 lb (88.5 kg)    Physical Exam Vitals signs reviewed.  Constitutional:      Appearance: She is obese. She is not ill-appearing.  HENT:     Nose: Nose normal.     Mouth/Throat:     Pharynx: Oropharynx is  clear. No oropharyngeal exudate or posterior oropharyngeal erythema.  Eyes:     General: No scleral icterus.    Conjunctiva/sclera: Conjunctivae normal.  Neck:     Musculoskeletal: Normal range of motion and neck supple. No neck rigidity.  Cardiovascular:     Rate and Rhythm: Normal rate and regular rhythm.     Heart sounds: No murmur.     Comments: EKG ----  Sinus  Rhythm  -Left atrial enlargement.   BORDERLINE  Pulmonary:     Effort: Pulmonary effort is normal. No respiratory distress.     Breath sounds: No stridor. No wheezing, rhonchi or rales.  Abdominal:     General: Bowel sounds are normal.     Palpations: There is no hepatomegaly, splenomegaly or mass.     Tenderness: There is no abdominal tenderness. There is no guarding.  Musculoskeletal: Normal range of motion.     Right lower leg: No edema.     Left lower leg: No edema.  Skin:    General: Skin is warm and dry.  Neurological:     General: No focal deficit present.  Psychiatric:        Mood and Affect: Mood is anxious.        Speech: Speech normal.        Behavior: Behavior normal.     Lab Results  Component Value Date   WBC 8.6 08/14/2018   HGB 14.7 08/14/2018   HCT 44.7 08/14/2018   PLT 279 08/14/2018   GLUCOSE 108 (H) 08/22/2018   CHOL 141 08/22/2018   TRIG 268.0 (H) 08/22/2018   HDL 27.00 (L) 08/22/2018   LDLDIRECT 83.0 08/22/2018   ALT 19 05/30/2018   AST 12 05/30/2018   NA 137 08/22/2018   K 4.3 08/22/2018   CL 102 08/22/2018   CREATININE 0.70 08/22/2018   BUN 13 08/22/2018   CO2 27 08/22/2018   TSH 4.30 08/16/2018   INR 1.10 01/13/2015   HGBA1C 6.8 (H) 08/22/2018    Dg Chest Portable 1 View  Result Date: 08/14/2018 CLINICAL DATA:  Shortness of breath beginning 2 weeks ago which is worsening. History of asthma. EXAM: PORTABLE CHEST 1 VIEW COMPARISON:  05/17/2018 FINDINGS: Artifact overlies the chest. Heart size upper limits of normal. Tortuosity of the aorta. The lungs appear clear today.  Interstitial prominence seen previously has resolved. No sign of infiltrate, collapse or effusion. No significant bone finding. IMPRESSION: No active disease. Electronically Signed   By: Nelson Chimes M.D.   On: 08/14/2018 14:18    Assessment & Plan:   Tyia was seen today for hypertension and diabetes.  Diagnoses and all orders for this visit:  Essential hypertension- Her blood pressure has improved but is not quite adequately well controlled.  In addition to taking the ARB I have asked her to improve her lifestyle modifications. -     Basic metabolic  panel; Future -     Hemoglobin A1c; Future  DOE (dyspnea on exertion)- Her EKG today is improved and only shows left atrial enlargement but no Q waves or ST/T wave chages.  Her troponin is negative for ischemia.  I am, however concerned about CAD so I have asked her to undergo a myocardial perfusion imaging. -     Troponin I -; Future -     EKG 12-Lead -     MYOCARDIAL PERFUSION IMAGING; Future  Abnormal electrocardiogram (ECG) (EKG)- See above -     Troponin I -; Future -     MYOCARDIAL PERFUSION IMAGING; Future  Prediabetes- Her A1c is up to 6.8%. -     Hemoglobin A1c; Future  Hyperlipidemia with target LDL less than 130-  She has an elevated ASCVD risk score so I have asked her to start taking a statin and a baby aspirin QD for CV risk reduction. -     Lipid panel; Future -     aspirin EC 81 MG tablet; Take 1 tablet (81 mg total) by mouth daily. -     rosuvastatin (CRESTOR) 10 MG tablet; Take 1 tablet (10 mg total) by mouth daily.  Type II diabetes mellitus with manifestations ([place])- She has new onset type 2 diabetes mellitus.  Medical therapy is not indicated at this time but I did encourage her to improve her lifestyle modifications. -     aspirin EC 81 MG tablet; Take 1 tablet (81 mg total) by mouth daily. -     rosuvastatin (CRESTOR) 10 MG tablet; Take 1 tablet (10 mg total) by mouth daily.   I am having Autym L. Baswell start  on aspirin EC and rosuvastatin. I am also having her maintain her omeprazole, albuterol, saccharomyces boulardii, guaiFENesin, albuterol, ibuprofen, aspirin-acetaminophen-caffeine, polyvinyl alcohol, sucralfate, levothyroxine, and losartan.  Meds ordered this encounter  Medications  . aspirin EC 81 MG tablet    Sig: Take 1 tablet (81 mg total) by mouth daily.    Dispense:  90 tablet    Refill:  1  . rosuvastatin (CRESTOR) 10 MG tablet    Sig: Take 1 tablet (10 mg total) by mouth daily.    Dispense:  90 tablet    Refill:  1     Follow-up: Return in about 4 weeks (around 09/19/2018).  Scarlette Calico, MD

## 2018-08-22 NOTE — Telephone Encounter (Signed)
Called and spoke with patient. In office appointment set for today at 1:30 pm with Dr. Ronnald Ramp.

## 2018-08-23 ENCOUNTER — Ambulatory Visit: Payer: Self-pay | Admitting: Family

## 2018-08-23 NOTE — Telephone Encounter (Signed)
She needs to get a better BP machine  TJ

## 2018-08-23 NOTE — Telephone Encounter (Signed)
Pt. Reports she was just in the office and BP was good. This morning  It was 178/98 and 159/95. No symptoms. Feels like it is her BP cuff is inaccurate. Will recheck it later today. Instructed to call as needed.  Answer Assessment - Initial Assessment Questions 1. BLOOD PRESSURE: "What is the blood pressure?" "Did you take at least two measurements 5 minutes apart?"     178/98    159/95 2. ONSET: "When did you take your blood pressure?"     This morning 3. HOW: "How did you obtain the blood pressure?" (e.g., visiting nurse, automatic home BP monitor)     Home monitor 4. HISTORY: "Do you have a history of high blood pressure?"     Yes 5. MEDICATIONS: "Are you taking any medications for blood pressure?" "Have you missed any doses recently?"     No missed doses 6. OTHER SYMPTOMS: "Do you have any symptoms?" (e.g., headache, chest pain, blurred vision, difficulty breathing, weakness)     No symptoms 7. PREGNANCY: "Is there any chance you are pregnant?" "When was your last menstrual period?"     No  Protocols used: HIGH BLOOD PRESSURE-A-AH

## 2018-08-24 ENCOUNTER — Telehealth (HOSPITAL_COMMUNITY): Payer: Self-pay | Admitting: *Deleted

## 2018-08-24 ENCOUNTER — Telehealth: Payer: Self-pay | Admitting: Emergency Medicine

## 2018-08-24 NOTE — Telephone Encounter (Signed)
Pt called stating her BP is still running slightly high. Before she called it was 144/98. When pt took BP while on the phone with me it was 122/76. Pt denies any headaches, blurred vision, or dizziness. Pt states she had major fatigue yesterday but feel great today. Pt also states she is having hip pain and has not had that before taking the Losartan. I advised pt that [name] is out of the office this week but I have scheduled a 4 week follow-up with [name]. Advised pt that [name] is out of the office this week but to monitor BP and [name] would get back with her on Monday.

## 2018-08-24 NOTE — Telephone Encounter (Signed)
Called patient to go over instructions for Myoview schedule for 08/29/18.  Patient does not have insurance and has not been able to reach billing department.  Would like to speak to someone to get a feel for cost associated with the test.  I will reach out to my supervisor to see who this patient needs to address.Veronia Beets

## 2018-08-25 NOTE — Telephone Encounter (Signed)
She needs to complete the cardiac work up that both Dr. Ronnald Ramp and I have recommended for her. Please ask her to send [place] blood pressure readings early next week for review.

## 2018-08-29 ENCOUNTER — Encounter (HOSPITAL_COMMUNITY): Payer: Self-pay

## 2018-09-19 ENCOUNTER — Other Ambulatory Visit (INDEPENDENT_AMBULATORY_CARE_PROVIDER_SITE_OTHER): Payer: Self-pay

## 2018-09-19 ENCOUNTER — Encounter: Payer: Self-pay | Admitting: Family

## 2018-09-19 ENCOUNTER — Other Ambulatory Visit: Payer: Self-pay

## 2018-09-19 ENCOUNTER — Ambulatory Visit (INDEPENDENT_AMBULATORY_CARE_PROVIDER_SITE_OTHER): Payer: Self-pay | Admitting: Family

## 2018-09-19 VITALS — BP 130/88 | HR 71 | Temp 97.6°F | Ht 62.0 in | Wt 206.0 lb

## 2018-09-19 DIAGNOSIS — E039 Hypothyroidism, unspecified: Secondary | ICD-10-CM

## 2018-09-19 DIAGNOSIS — I10 Essential (primary) hypertension: Secondary | ICD-10-CM

## 2018-09-19 DIAGNOSIS — E118 Type 2 diabetes mellitus with unspecified complications: Secondary | ICD-10-CM

## 2018-09-19 DIAGNOSIS — E785 Hyperlipidemia, unspecified: Secondary | ICD-10-CM

## 2018-09-19 DIAGNOSIS — M25551 Pain in right hip: Secondary | ICD-10-CM

## 2018-09-19 LAB — TSH: TSH: 0.45 u[IU]/mL (ref 0.35–4.50)

## 2018-09-19 MED ORDER — AMLODIPINE BESYLATE 5 MG PO TABS: 5.0000 mg | ORAL_TABLET | Freq: Every day | ORAL | 1 refills | Status: DC

## 2018-09-19 MED ORDER — METFORMIN HCL ER 500 MG PO TB24: 500.0000 mg | ORAL_TABLET | Freq: Every day | ORAL | 2 refills | Status: DC

## 2018-09-19 NOTE — Progress Notes (Signed)
Katrina [name] is a 63 y.o. female with the following history as recorded in [employer]:  Patient Active Problem List   Diagnosis Date Noted  . Abnormal electrocardiogram (ECG) (EKG) 08/22/2018  . Prediabetes 08/22/2018  . Hyperlipidemia with target LDL less than 130 08/22/2018  . Type II diabetes mellitus with manifestations ([place]) 08/22/2018  . Esophageal stricture   . Obesity (BMI 30.0-34.9)   . Lobar pneumonia ([place])   . Hypertension 09/27/2017  . 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 Low back pain associated with a spinal disorder other than radiculopathy or spinal stenosis 03/01/2016  . Myalgia 10/09/2015  . Upper airway cough syndrome 01/26/2015  . Obesity (BMI 30-39.9) 01/26/2015  . Pulmonary nodules   . DOE (dyspnea on exertion) 01/01/2015  . Pulmonary sarcoidosis ([place])   . Mediastinal lymphadenopathy 12/26/2014  . Esophageal ring, acquired 11/10/2012  . Dysphagia, unspecified(787.20) 11/09/2012  . Hypothyroidism 11/08/2012  . 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 Hypothyroidism, acquired 12/28/2007  . GERD 12/28/2007  . 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 albuterol (PROVENTIL HFA;VENTOLIN HFA) 108 (90 Base) MCG/ACT inhaler Inhale 2 puffs into the lungs every 4 (four) hours as needed for wheezing or shortness of breath. 1 Inhaler 0  . aspirin EC 81 MG tablet Take 1 tablet (81 mg total) by mouth daily. 90 tablet 1  . aspirin-acetaminophen-caffeine (EXCEDRIN MIGRAINE) 250-250-65 MG tablet Take 1 tablet by mouth every 6 (six) hours as needed for headache.    . guaiFENesin (MUCINEX) 600 MG 12 hr tablet Take 1 tablet (600 mg total) by mouth 2 (two) times daily. 60 tablet 0  . ibuprofen (ADVIL) 200 MG tablet Take 200 mg by mouth every 6 (six) hours as needed for headache or mild pain.    Marland Kitchen levothyroxine (SYNTHROID) 150 MCG tablet Take 1 tablet (150 mcg total) by mouth  daily before breakfast. 90 tablet 0  . omeprazole (PRILOSEC) 20 MG capsule Take 20 mg by mouth daily.    . polyvinyl alcohol (LIQUIFILM TEARS) 1.4 % ophthalmic solution Place 1 drop into both eyes as needed for dry eyes (itching/allergies).    . rosuvastatin (CRESTOR) 10 MG tablet Take 1 tablet (10 mg total) by mouth daily. 90 tablet 1  . saccharomyces boulardii (FLORASTOR) 250 MG capsule Take 1 capsule (250 mg total) by mouth 2 (two) times daily. (Patient taking differently: Take 250 mg by mouth daily. ) 60 capsule 0  . sucralfate (CARAFATE) 1 g tablet Take 1 tablet (1 g total) by mouth 4 (four) times daily -  with meals and at bedtime. 30 tablet 1  . albuterol (PROVENTIL HFA;VENTOLIN HFA) 108 (90 Base) MCG/ACT inhaler Inhale 1-2 puffs into the lungs every 6 (six) hours as needed for wheezing or shortness of breath. (Patient not taking: Reported on 09/19/2018) 1 Inhaler 0  . amLODipine (NORVASC) 5 MG tablet Take 1 tablet (5 mg total) by mouth daily. 30 tablet 1  . losartan (COZAAR) 50 MG tablet Take 1 tablet (50 mg total) by mouth daily. (Patient not taking: Reported on 09/19/2018) 90 tablet 0  . metFORMIN (GLUCOPHAGE XR) 500 MG 24 hr tablet Take 1 tablet (500 mg total) by mouth daily. 30 tablet 2   No current facility-administered medications for this visit.     Allergies: Penicillins and Bactrim ds [sulfamethoxazole-trimethoprim]  Past Medical History:  Diagnosis Date  . BCC (basal cell carcinoma of skin)  Infundibulocystic 03/11/2008   Inner Left Eye  . Chest pain at rest 11/07/2012  . Diabetes mellitus without complication ([place])   . Eosinophilic esophagitis 23/76/2831  . Esophageal stricture   . Exercise-induced asthma   . GERD (gastroesophageal reflux disease)   . Hypertension 09/27/2017  . Hypothyroidism   . Sarcoidosis   . Shortness of breath 11/08/2012   RECENTLY  & WHEN i EXERCISE"  . Solar lentigo 03/11/2008   Back - Atypical  . Thyroid disease   . Upper airway cough syndrome      Past Surgical History:  Procedure Laterality Date  . ABDOMINAL HYSTERECTOMY     Partial  . ESOPHAGOGASTRODUODENOSCOPY (EGD) WITH ESOPHAGEAL DILATION N/A 11/10/2012   Procedure: ESOPHAGOGASTRODUODENOSCOPY (EGD) WITH ESOPHAGEAL DILATION;  Surgeon: Gatha Mayer, MD;  Location: [facility];  Service: Endoscopy;  Laterality: N/A;  [place] vs. [place]oon  . VIDEO BRONCHOSCOPY WITH ENDOBRONCHIAL ULTRASOUND Bilateral 01/15/2015   Procedure: VIDEO BRONCHOSCOPY WITH ENDOBRONCHIAL ULTRASOUND of  LEFT LUNG LOWER LOBE and right lung;  Surgeon: Collene Gobble, MD;  Location: [facility];  Service: Thoracic;  Laterality: Bilateral;    Family History  Problem Relation Age of Onset  . Diabetes Mother   . Stroke Mother   . Coronary artery disease Father   . Coronary artery disease Brother   . Hypothyroidism Maternal Grandmother   . Diabetes Maternal Grandfather   . Heart disease Paternal Grandmother   . Heart disease Paternal Grandfather     Social History   Tobacco Use  . Smoking status: Never Smoker  . Smokeless tobacco: Never Used  Substance Use Topics  . Alcohol use: No    Subjective:  Follow up on hypertension; patient stopped Losartan on her own because she was concerned it was causing her hip to hurt; has re-started Lisinopril but concerned that it is aggravating her stomach; does want to consider changing to a 3rd medication; she does admit that she has started to feel better since being on blood pressure medication- she feels that she is not as winded with activity lately; cardiology evaluation has been recommended but patient cannot afford at this time and will re-consider once her Medicare is in place later this year.  Knows that she has chronic right hip pain- in agreement that she needs to see back specialist;  Hgba1c last month was 6.8; Based on lipid panel from May, statin therapy was recommended; per patient, she did not start taking medication. Does feel that she is having a hard time losing  weight.   The 10-year ASCVD risk score Mikey Bussing DC Brooke Bonito., et al., 2013) is: 14.7%   Values used to calculate the score:     Age: 40 years     Sex: Female     Is Non-Hispanic African American: No     Diabetic: Yes     Tobacco smoker: No     Systolic Blood Pressure: 517 mmHg     Is BP treated: Yes     HDL Cholesterol: 27 mg/dL     Total Cholesterol: 141 mg/dL      Objective:  Vitals:   09/19/18 1024  BP: 130/88  Pulse: 71  Temp: 97.6 F (36.4 C)  TempSrc: Oral  SpO2: 97%  Weight: 206 lb (93.4 kg)  Height: 5\' 2"  (1.575 m)    General: Well developed, well nourished, in no acute distress  Skin : Warm and dry.  Head: Normocephalic and atraumatic  Lungs: Respirations unlabored; clear to auscultation bilaterally without wheeze, rales,  rhonchi  CVS exam: normal rate and regular rhythm.  Musculoskeletal: No deformities; no active joint inflammation  Extremities: No edema, cyanosis, clubbing  Vessels: Symmetric bilaterally  Neurologic: Alert and oriented; speech intact; face symmetrical; moves all extremities well; CNII-XII intact without focal deficit   Assessment:  1. Hypothyroidism, acquired   2. Essential hypertension   3. Type II diabetes mellitus with manifestations ([place])   4. Hyperlipidemia with target LDL less than 130   5. Right hip pain     Plan:  1. Check TSH today; will adjust medication accordingly; 2. Will try Amlodipine 5 mg daily as patient is concerned for intolerance to both ACE and ARB; follow up in 4 weeks; 3. Trial of Metformin XR 500 mg qd; 4. Encouraged to start the Crestor that was called in last month; 5. Recommend to see sports medicine;   Return in about 4 weeks (around 10/17/2018) for Dr. Tamala Julian right hip as soon as possible.  Orders Placed This Encounter  Procedures  . TSH    Standing Status:   Future    Number of Occurrences:   1    Standing Expiration Date:   09/19/2019    Requested Prescriptions   Signed Prescriptions Disp Refills  .  amLODipine (NORVASC) 5 MG tablet 30 tablet 1    Sig: Take 1 tablet (5 mg total) by mouth daily.  . metFORMIN (GLUCOPHAGE XR) 500 MG 24 hr tablet 30 tablet 2    Sig: Take 1 tablet (500 mg total) by mouth daily.

## 2018-09-24 ENCOUNTER — Other Ambulatory Visit: Payer: Self-pay

## 2018-09-24 ENCOUNTER — Encounter (HOSPITAL_BASED_OUTPATIENT_CLINIC_OR_DEPARTMENT_OTHER): Payer: Self-pay | Admitting: Emergency Medicine

## 2018-09-24 ENCOUNTER — Emergency Department (HOSPITAL_BASED_OUTPATIENT_CLINIC_OR_DEPARTMENT_OTHER)
Admission: EM | Admit: 2018-09-24 | Discharge: 2018-09-24 | Disposition: A | Discharge status: Home / Self Care | Payer: Self-pay | Attending: Emergency Medicine | Admitting: Emergency Medicine

## 2018-09-24 DIAGNOSIS — E039 Hypothyroidism, unspecified: Secondary | ICD-10-CM | POA: Insufficient documentation

## 2018-09-24 DIAGNOSIS — Z7984 Long term (current) use of oral hypoglycemic drugs: Secondary | ICD-10-CM | POA: Insufficient documentation

## 2018-09-24 DIAGNOSIS — Z7982 Long term (current) use of aspirin: Secondary | ICD-10-CM | POA: Insufficient documentation

## 2018-09-24 DIAGNOSIS — E119 Type 2 diabetes mellitus without complications: Secondary | ICD-10-CM | POA: Insufficient documentation

## 2018-09-24 DIAGNOSIS — T887XXA Unspecified adverse effect of drug or medicament, initial encounter: Secondary | ICD-10-CM | POA: Insufficient documentation

## 2018-09-24 DIAGNOSIS — Y69 Unspecified misadventure during surgical and medical care: Secondary | ICD-10-CM | POA: Insufficient documentation

## 2018-09-24 DIAGNOSIS — Z79899 Other long term (current) drug therapy: Secondary | ICD-10-CM | POA: Insufficient documentation

## 2018-09-24 DIAGNOSIS — I10 Essential (primary) hypertension: Secondary | ICD-10-CM | POA: Insufficient documentation

## 2018-09-24 MED ORDER — PREDNISONE 20 MG PO TABS: 40.0000 mg | ORAL_TABLET | Freq: Every day | ORAL | 0 refills | Status: AC

## 2018-09-24 MED ORDER — PREDNISONE 50 MG PO TABS
60.0000 mg | ORAL_TABLET | Freq: Once | ORAL | Status: AC
  Administered 2018-09-24: 60 mg via ORAL
  Filled 2018-09-24: qty 1

## 2018-09-24 MED ORDER — DIPHENHYDRAMINE HCL 25 MG PO CAPS
25.0000 mg | ORAL_CAPSULE | Freq: Once | ORAL | Status: AC
  Administered 2018-09-24: 25 mg via ORAL
  Filled 2018-09-24: qty 1

## 2018-09-24 MED ORDER — DIPHENHYDRAMINE HCL 25 MG PO TABS: 25.0000 mg | ORAL_TABLET | Freq: Three times a day (TID) | ORAL | 0 refills | Status: DC | PRN

## 2018-09-24 NOTE — ED Triage Notes (Signed)
Reports having reaction about 30 minutes after taking amlodipine and metformin.  Reports swelling in face, neck and ears.  Also reports lips feel tingly.

## 2018-09-24 NOTE — Discharge Instructions (Addendum)
You were seen today for an adverse reaction to your medications.  Discontinue taking his medications and follow-up with your primary physician.  Take prednisone for the next 3 days.  Additionally you may take Benadryl as needed for symptoms.

## 2018-09-24 NOTE — ED Provider Notes (Signed)
[facility] EMERGENCY DEPARTMENT Provider Note   CSN: [id number] Arrival date & time: 09/24/18  2237    History   Chief Complaint Chief Complaint  Patient presents with  . Allergic Reaction    HPI Katrina [name] is a 63 y.o. female.     HPI  This is a 63 year old female with a history of hypertension, hypothyroidism, recent diagnosis of diabetes who presents with possible medication reaction.  Patient reports that she just recently started amlodipine and metformin on June 2.  Yesterday she noted some facial flushing after taking her medications.  She takes them together.  Tonight she took her medications at 7 PM.  She noted facial flushing, cheek swelling, and tingling of her lips.  She denies any difficulty swallowing, shortness of breath, nausea, vomiting, rash.  She states that she occasionally feels itchy.  She reports that she has a history of allergies to "everything."  She has not taken anything for her symptoms prior to arrival.  Past Medical History:  Diagnosis Date  . BCC (basal cell carcinoma of skin) Infundibulocystic 03/11/2008   Inner Left Eye  . Chest pain at rest 11/07/2012  . Diabetes mellitus without complication ([place])   . Eosinophilic esophagitis 74/94/4967  . Esophageal stricture   . Exercise-induced asthma   . GERD (gastroesophageal reflux disease)   . Hypertension 09/27/2017  . Hypothyroidism   . Sarcoidosis   . Shortness of breath 11/08/2012   RECENTLY  & WHEN i EXERCISE"  . Solar lentigo 03/11/2008   Back - Atypical  . Thyroid disease   . Upper airway cough syndrome     Patient Active Problem List   Diagnosis Date Noted  . Abnormal electrocardiogram (ECG) (EKG) 08/22/2018  . Prediabetes 08/22/2018  . Hyperlipidemia with target LDL less than 130 08/22/2018  . Type II diabetes mellitus with manifestations ([place]) 08/22/2018  . Esophageal stricture   . Obesity (BMI 30.0-34.9)   . Lobar pneumonia ([place])   . Hypertension 09/27/2017  . Cough  variant asthma vs UACS 01/18/2017  . Low back pain associated with a spinal disorder other than radiculopathy or spinal stenosis 03/01/2016  . Myalgia 10/09/2015  . Upper airway cough syndrome 01/26/2015  . Obesity (BMI 30-39.9) 01/26/2015  . Pulmonary nodules   . DOE (dyspnea on exertion) 01/01/2015  . Pulmonary sarcoidosis ([place])   . Mediastinal lymphadenopathy 12/26/2014  . Esophageal ring, acquired 11/10/2012  . Dysphagia, unspecified(787.20) 11/09/2012  . Hypothyroidism 11/08/2012  . 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 Hypothyroidism, acquired 12/28/2007  . GERD 12/28/2007  . 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 ABDOMINAL HYSTERECTOMY     Partial  . ESOPHAGOGASTRODUODENOSCOPY (EGD) WITH ESOPHAGEAL DILATION N/A 11/10/2012   Procedure: ESOPHAGOGASTRODUODENOSCOPY (EGD) WITH ESOPHAGEAL DILATION;  Surgeon: Gatha Mayer, MD;  Location: [facility];  Service: Endoscopy;  Laterality: N/A;  [place] vs. Balloon  . VIDEO BRONCHOSCOPY WITH ENDOBRONCHIAL ULTRASOUND Bilateral 01/15/2015   Procedure: VIDEO BRONCHOSCOPY WITH ENDOBRONCHIAL ULTRASOUND of  LEFT LUNG LOWER LOBE and right lung;  Surgeon: Collene Gobble, MD;  Location: [facility];  Service: Thoracic;  Laterality: Bilateral;     OB History   No obstetric history on file.      Home Medications    Prior to Admission medications   Medication Sig Start Date End Date Taking? Authorizing Provider  albuterol (PROVENTIL HFA;VENTOLIN HFA) 108 (90 Base) MCG/ACT inhaler Inhale 1-2 puffs into  the lungs every 6 (six) hours as needed for wheezing or shortness of breath. Patient not taking: Reported on 09/19/2018 04/24/18   Binnie Rail, MD  albuterol (PROVENTIL HFA;VENTOLIN HFA) 108 (90 Base) MCG/ACT inhaler Inhale 2 puffs into the lungs every 4 (four) hours as needed for wheezing or shortness of breath. 05/17/18   Francine Graven, DO  amLODipine  (NORVASC) 5 MG tablet Take 1 tablet (5 mg total) by mouth daily. 09/19/18   Marrian Salvage, FNP  aspirin EC 81 MG tablet Take 1 tablet (81 mg total) by mouth daily. 08/22/18   Janith Lima, MD  aspirin-acetaminophen-caffeine (EXCEDRIN MIGRAINE) [phone] MG tablet Take 1 tablet by mouth every 6 (six) hours as needed for headache.    [provider]  diphenhydrAMINE (BENADRYL) 25 MG tablet Take 1 tablet (25 mg total) by mouth every 8 (eight) hours as needed. 09/24/18   Akisha Sturgill, Barbette Hair, MD  guaiFENesin (MUCINEX) 600 MG 12 hr tablet Take 1 tablet (600 mg total) by mouth 2 (two) times daily. 05/02/18   Florencia Reasons, MD  ibuprofen (ADVIL) 200 MG tablet Take 200 mg by mouth every 6 (six) hours as needed for headache or mild pain.    [provider]  levothyroxine (SYNTHROID) 150 MCG tablet Take 1 tablet (150 mcg total) by mouth daily before breakfast. 08/18/18   Marrian Salvage, FNP  losartan (COZAAR) 50 MG tablet Take 1 tablet (50 mg total) by mouth daily. Patient not taking: Reported on 09/19/2018 08/18/18   Marrian Salvage, FNP  metFORMIN (GLUCOPHAGE XR) 500 MG 24 hr tablet Take 1 tablet (500 mg total) by mouth daily. 09/19/18   Marrian Salvage, FNP  omeprazole (PRILOSEC) 20 MG capsule Take 20 mg by mouth daily.    [provider]  polyvinyl alcohol (LIQUIFILM TEARS) 1.4 % ophthalmic solution Place 1 drop into both eyes as needed for dry eyes (itching/allergies).    [provider]  predniSONE (DELTASONE) 20 MG tablet Take 2 tablets (40 mg total) by mouth daily for 3 days. 09/24/18 09/27/18  Orlando Devereux, Barbette Hair, MD  rosuvastatin (CRESTOR) 10 MG tablet Take 1 tablet (10 mg total) by mouth daily. 08/22/18   Janith Lima, MD  saccharomyces boulardii (FLORASTOR) 250 MG capsule Take 1 capsule (250 mg total) by mouth 2 (two) times daily. Patient taking differently: Take 250 mg by mouth daily.  05/02/18   Florencia Reasons, MD  sucralfate (CARAFATE) 1 g tablet Take 1  tablet (1 g total) by mouth 4 (four) times daily -  with meals and at bedtime. 08/14/18   Dorie Rank, MD    Family History Family History  Problem Relation Age of Onset  . Diabetes Mother   . Stroke Mother   . Coronary artery disease Father   . Coronary artery disease Brother   . Hypothyroidism Maternal Grandmother   . Diabetes Maternal Grandfather   . Heart disease Paternal Grandmother   . Heart disease Paternal Grandfather     Social History Social History   Tobacco Use  . Smoking status: Never Smoker  . Smokeless tobacco: Never Used  Substance Use Topics  . Alcohol use: No  . Drug use: No     Allergies   Penicillins and Bactrim ds [sulfamethoxazole-trimethoprim]   Review of Systems Review of Systems  Constitutional: Negative for fever.  HENT: Negative for trouble swallowing.        Facial flushing and swelling  Respiratory: Negative for shortness of breath.  Cardiovascular: Negative for chest pain.  Gastrointestinal: Negative for nausea and vomiting.  Genitourinary: Negative for dysuria.  Skin: Negative for rash.  Neurological:       Tingling  All other systems reviewed and are negative.    Physical Exam Updated Vital Signs BP (!) 175/87   Pulse 72   Temp 97.6 F (36.4 C) (Oral)   Resp 18   Ht 1.575 m (5\' 2" )   Wt 90.7 kg   SpO2 95%   BMI 36.58 kg/m   Physical Exam Vitals signs and nursing note reviewed.  Constitutional:      Appearance: She is well-developed. She is obese.     Comments: ABCs intact  HENT:     Head: Normocephalic and atraumatic.     Comments: Flushing noted of the bilateral cheeks, no significant swelling noted, mucous membranes moist, no oropharyngeal swelling noted Eyes:     Pupils: Pupils are equal, round, and reactive to light.  Neck:     Musculoskeletal: Neck supple.  Cardiovascular:     Rate and Rhythm: Normal rate and regular rhythm.     Heart sounds: Normal heart sounds.  Pulmonary:     Effort: Pulmonary effort  is normal. No respiratory distress.     Breath sounds: No wheezing.  Abdominal:     General: Bowel sounds are normal.     Palpations: Abdomen is soft.     Tenderness: There is no abdominal tenderness.  Musculoskeletal:     Right lower leg: No edema.     Left lower leg: No edema.  Lymphadenopathy:     Cervical: No cervical adenopathy.  Skin:    General: Skin is warm and dry.     Findings: No rash.  Neurological:     Mental Status: She is alert and oriented to person, place, and time.  Psychiatric:        Mood and Affect: Mood normal.      ED Treatments / Results  Labs (all labs ordered are listed, but only abnormal results are displayed) Labs Reviewed - No data to display  EKG None  Radiology No results found.  Procedures Procedures (including critical care time)  Medications Ordered in ED Medications  diphenhydrAMINE (BENADRYL) capsule 25 mg (has no administration in time range)  predniSONE (DELTASONE) tablet 60 mg (has no administration in time range)     Initial Impression / Assessment and Plan / ED Course  I have reviewed the triage vital signs and the nursing notes.  Pertinent labs & imaging results that were available during my care of the patient were reviewed by me and considered in my medical decision making (see chart for details).        Patient presents concern for medication reaction.  She is overall nontoxic-appearing.  No signs or symptoms of anaphylaxis.  She does not have a classic IgE mediated allergy syndrome.  However, she does have some slight facial flushing noted.  I discussed with her discontinuing the 2 medications as we are not sure which one is causing her symptoms.  She will be given a short course of prednisone and advised her that this may cause her blood sugars to elevate.  I reviewed her chart and her last blood sugars are in the 100-1 50 range.  I feel like she can tolerate a short course of prednisone for anti-inflammatory effect.   I also recommend Benadryl as needed.  No indication for an EpiPen.  She will need to follow-up closely with her primary  physician regarding alternative medications  After history, exam, and medical workup I feel the patient has been appropriately medically screened and is safe for discharge home. Pertinent diagnoses were discussed with the patient. Patient was given return precautions.  Final Clinical Impressions(s) / ED Diagnoses   Final diagnoses:  Non-dose-related adverse reaction to medication, initial encounter    ED Discharge Orders         Ordered    predniSONE (DELTASONE) 20 MG tablet  Daily     09/24/18 2320    diphenhydrAMINE (BENADRYL) 25 MG tablet  Every 8 hours PRN     09/24/18 2320           Merryl Hacker, MD 09/24/18 2324

## 2018-10-01 IMAGING — DX DG CHEST 2V
2 series · 2 of 2 positions shown · non-contrast
Comparison: 10/08/2015

CLINICAL DATA: Shortness of Breath

EXAM:
CHEST  2 VIEW

[w chest pa]
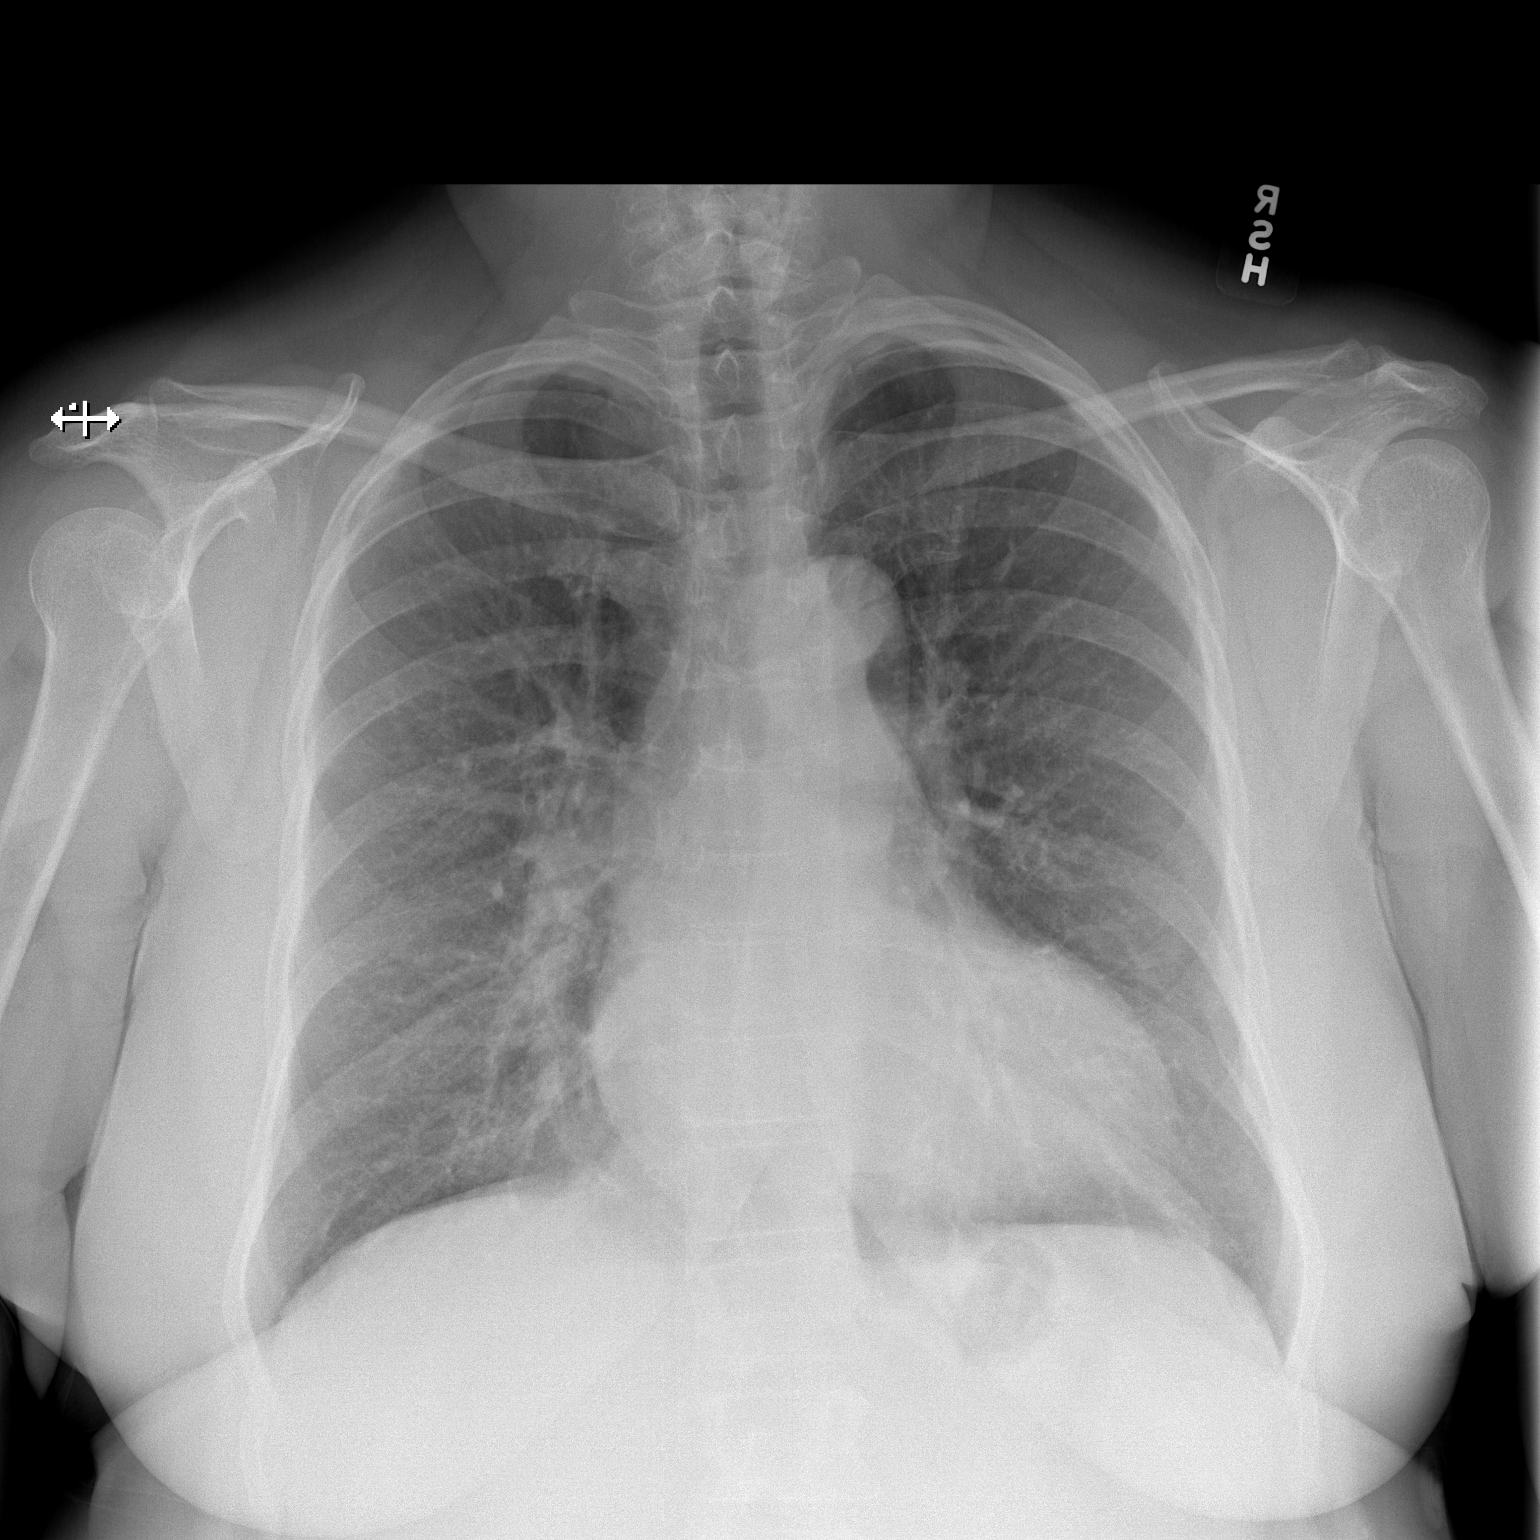

[w chest lat]
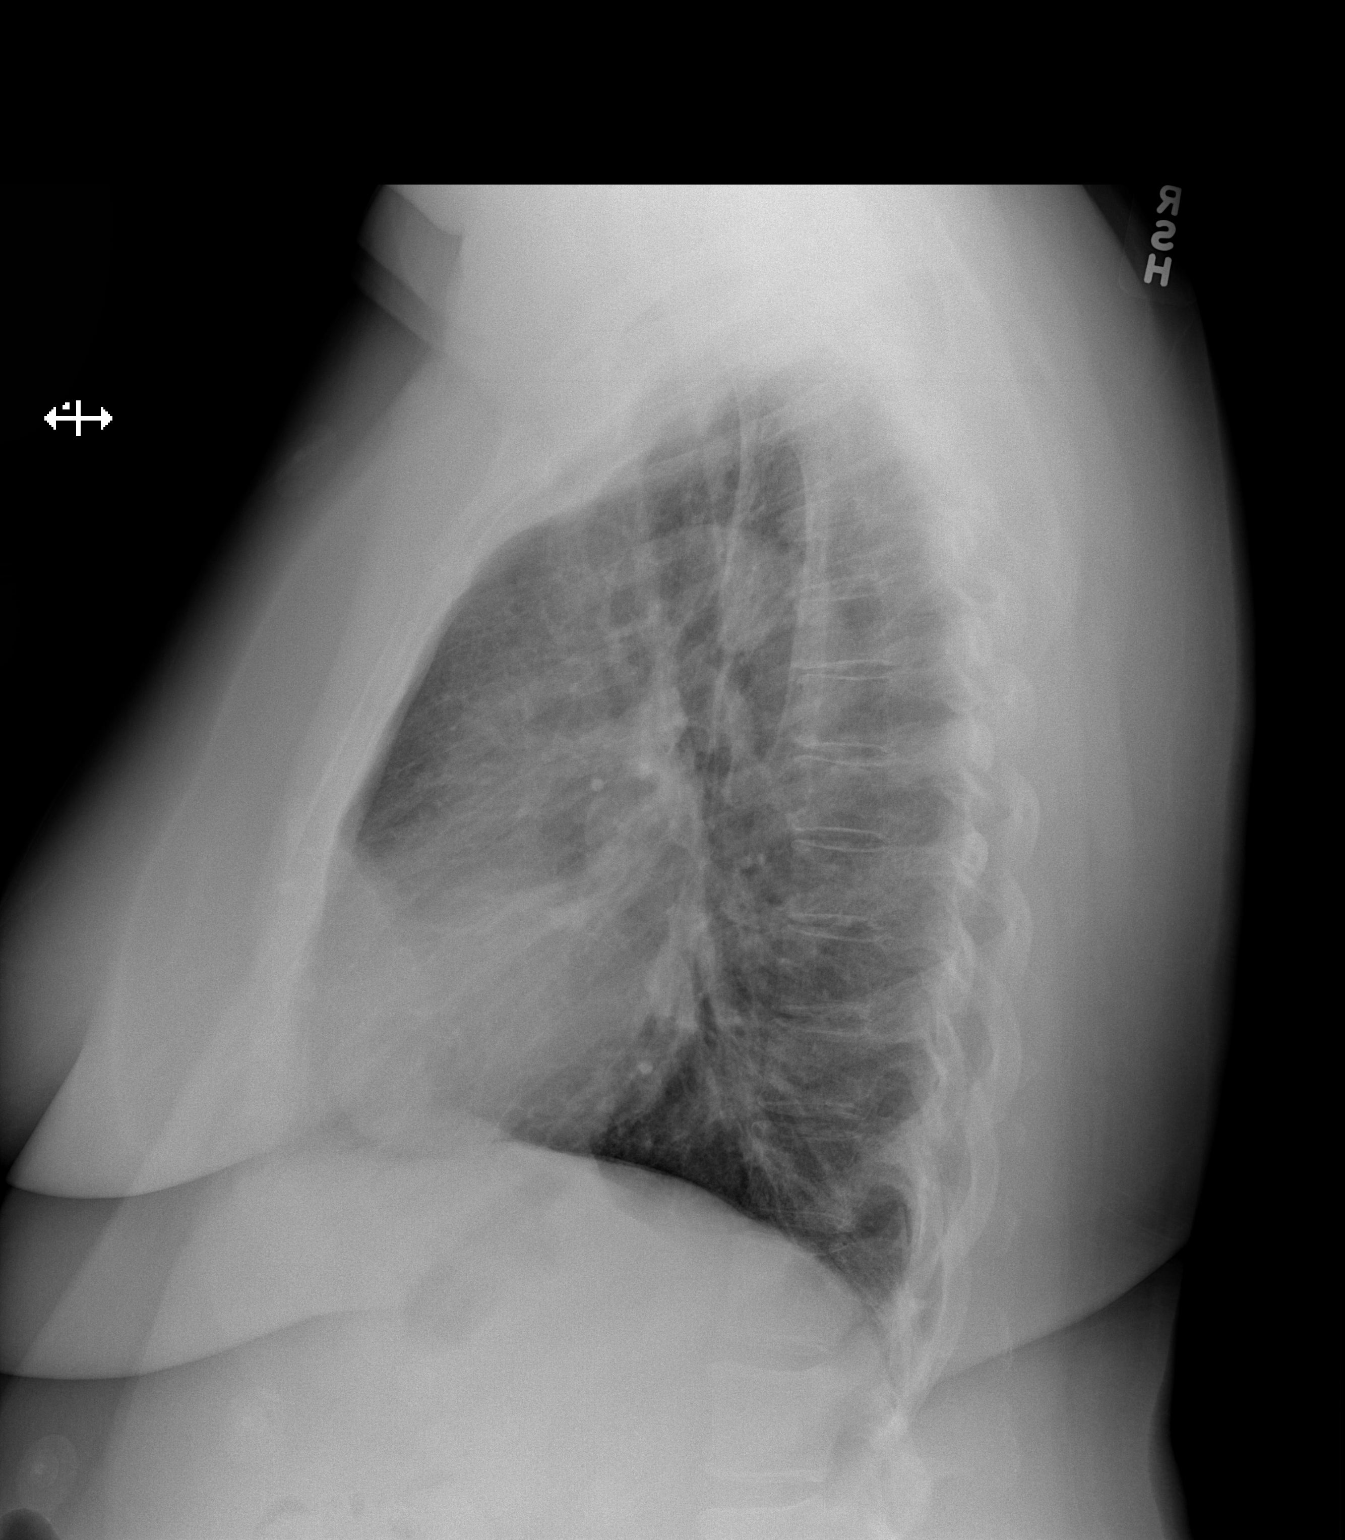

[2 of 2 positions shown; findings below may reference images not displayed]

FINDINGS: Mild cardiomegaly. Lungs are clear. No effusions. No acute bony
abnormality.
IMPRESSION: Mild cardiomegaly.  No active disease.

## 2018-10-02 ENCOUNTER — Telehealth: Payer: Self-pay | Admitting: Neurology

## 2018-10-02 ENCOUNTER — Encounter: Payer: Self-pay | Admitting: Neurology

## 2018-10-02 NOTE — Telephone Encounter (Signed)
Attempted to call the patient to discuss whether she would like to keep office visit as in office or change to Video Visit. There was no answer and no VM setup. I sent a mychart message to the patient. If pt calls back please ask the pt if she would like to keep apt as in  Office or chg to VV if so we can change to mychart video since her mychart appears to be active.

## 2018-10-03 NOTE — Telephone Encounter (Signed)
I called patient back this morning. I was able to get in touch with her. Patient states that she is thinking that she may need to see her cardiologist first before she proceeds with sleep. Patient explained that her EKG results are not normal and her blood pressure is staying elevated. She requested to have her appt at [facility] rescheduled out for a month. We rescheduled her appt to July 20th, and I advised her to definitely check with her cardiologist and see what they say. I told patient to call us back if she wants to move her appt closer. Patient verbalized understanding.

## 2018-10-03 NOTE — Progress Notes (Signed)
[name] [address] [place], [place] [address] Phone: [phone] Subjective:   Katrina [name], am serving as a scribe for Dr. Hulan Saas.    CC: Right hip pain  BTD:[id number]  Katrina [name] is a 63 y.o. female coming in with complaint of right hip pain. Has been having pain for one month in the right glute. Patient does walk a lot. Was doing water aerobics and was doing a lot of jumping and running in March of this year. [name] pain but then she went on lockdown from Covid. Was placed on Lisinopril for bp and this corresponded to when her hip pain started. Unsure if she injured it during her classes or if it was coming from [employer]. She is [name] longer taking the medication because she was in the bed for 5 days due to pain. Has tried to take diet pills in the past and she developed the same hip pain. Phentermine is the drug she tried. Unable to walk longer distances. Wants to begin exercising again due to high bp and cholesterol.    Patient did have back x-rays done in 2017.  This was independently visualized by me.  Found to have degenerative disc disease mostly at L4-L5 mild everywhere else  Past Medical History:  Diagnosis Date  . BCC (basal cell carcinoma of skin) Infundibulocystic 03/11/2008   Inner Left Eye  . Chest pain at rest 11/07/2012  . Diabetes mellitus without complication ([place])   . Eosinophilic esophagitis 64/68/0321  . Esophageal stricture   . Exercise-induced asthma   . GERD (gastroesophageal reflux disease)   . Hypertension 09/27/2017  . Hypothyroidism   . Sarcoidosis   . Shortness of breath 11/08/2012   RECENTLY  & WHEN i EXERCISE"  . Solar lentigo 03/11/2008   Back - Atypical  . Thyroid disease   . Upper airway cough syndrome    Past Surgical History:  Procedure Laterality Date  . ABDOMINAL HYSTERECTOMY     Partial  . ESOPHAGOGASTRODUODENOSCOPY (EGD) WITH ESOPHAGEAL DILATION N/A 11/10/2012   Procedure:  ESOPHAGOGASTRODUODENOSCOPY (EGD) WITH ESOPHAGEAL DILATION;  Surgeon: Katrina Mayer, MD;  Location: [facility];  Service: Endoscopy;  Laterality: N/A;  [place] vs. Balloon  . VIDEO BRONCHOSCOPY WITH ENDOBRONCHIAL ULTRASOUND Bilateral 01/15/2015   Procedure: VIDEO BRONCHOSCOPY WITH ENDOBRONCHIAL ULTRASOUND of  LEFT LUNG LOWER LOBE and right lung;  Surgeon: Collene Gobble, MD;  Location: [facility];  Service: Thoracic;  Laterality: Bilateral;   Social History   Socioeconomic History  . Marital status: Divorced    Spouse name: Not on file  . Number of children: 3  . Years of education: 73  . Highest education level: Not on file  Occupational History  . Occupation: Unemployed  Social Needs  . Financial resource strain: Not on file  . Food insecurity    Worry: Not on file    Inability: Not on file  . Transportation needs    Medical: Not on file    Non-medical: Not on file  Tobacco Use  . Smoking status: Never Smoker  . Smokeless tobacco: Never Used  Substance and Sexual Activity  . Alcohol use: [name]  . Drug use: [name]  . Sexual activity: Not on file  Lifestyle  . Physical activity    Days per week: Not on file    Minutes per session: Not on file  . Stress: Not on file  Relationships  . Social connections    Talks on phone: Not on  file    Gets together: Not on file    Attends religious service: Not on file    Active member of club or organization: Not on file    Attends meetings of clubs or organizations: Not on file    Relationship status: Not on file  Other Topics Concern  . Not on file  Social History Narrative   Fun: Anything and everything   Allergies  Allergen Reactions  . Penicillins Shortness Of Breath and Rash    DID THE REACTION INVOLVE: Swelling of the face/tongue/throat, SOB, or low BP? N Sudden or severe rash/hives, skin peeling, or the inside of the mouth or nose? Yes Did it require medical treatment? [name] When did it last happen?2010 If all above answers are  "[name]", may proceed with cephalosporin use.   . Bactrim Ds [Sulfamethoxazole-Trimethoprim] Rash    Upper body and Face redness with heat.     Family History  Problem Relation Age of Onset  . Diabetes Mother   . Stroke Mother   . Coronary artery disease Father   . Coronary artery disease Brother   . Hypothyroidism Maternal Grandmother   . Diabetes Maternal Grandfather   . Heart disease Paternal Grandmother   . Heart disease Paternal Grandfather     Current Outpatient Medications (Endocrine & Metabolic):  .  levothyroxine (SYNTHROID) 150 MCG tablet, Take 1 tablet (150 mcg total) by mouth daily before breakfast. .  metFORMIN (GLUCOPHAGE XR) 500 MG 24 hr tablet, Take 1 tablet (500 mg total) by mouth daily.  Current Outpatient Medications (Cardiovascular):  .  amLODipine (NORVASC) 5 MG tablet, Take 1 tablet (5 mg total) by mouth daily. Marland Kitchen  losartan (COZAAR) 50 MG tablet, Take 1 tablet (50 mg total) by mouth daily. .  rosuvastatin (CRESTOR) 10 MG tablet, Take 1 tablet (10 mg total) by mouth daily.  Current Outpatient Medications (Respiratory):  .  albuterol (PROVENTIL HFA;VENTOLIN HFA) 108 (90 Base) MCG/ACT inhaler, Inhale 1-2 puffs into the lungs every 6 (six) hours as needed for wheezing or shortness of breath. Marland Kitchen  albuterol (PROVENTIL HFA;VENTOLIN HFA) 108 (90 Base) MCG/ACT inhaler, Inhale 2 puffs into the lungs every 4 (four) hours as needed for wheezing or shortness of breath. .  diphenhydrAMINE (BENADRYL) 25 MG tablet, Take 1 tablet (25 mg total) by mouth every 8 (eight) hours as needed. Marland Kitchen  guaiFENesin (MUCINEX) 600 MG 12 hr tablet, Take 1 tablet (600 mg total) by mouth 2 (two) times daily.  Current Outpatient Medications (Analgesics):  .  aspirin EC 81 MG tablet, Take 1 tablet (81 mg total) by mouth daily. Marland Kitchen  aspirin-acetaminophen-caffeine (EXCEDRIN MIGRAINE) 250-250-65 MG tablet, Take 1 tablet by mouth every 6 (six) hours as needed for headache. .  ibuprofen (ADVIL) 200 MG tablet,  Take 200 mg by mouth every 6 (six) hours as needed for headache or mild pain.   Current Outpatient Medications (Other):  .  omeprazole (PRILOSEC) 20 MG capsule, Take 20 mg by mouth daily. .  polyvinyl alcohol (LIQUIFILM TEARS) 1.4 % ophthalmic solution, Place 1 drop into both eyes as needed for dry eyes (itching/allergies). .  saccharomyces boulardii (FLORASTOR) 250 MG capsule, Take 1 capsule (250 mg total) by mouth 2 (two) times daily. (Patient taking differently: Take 250 mg by mouth daily. ) .  sucralfate (CARAFATE) 1 g tablet, Take 1 tablet (1 g total) by mouth 4 (four) times daily -  with meals and at bedtime.    Past medical history, social, surgical and family history all  reviewed in electronic medical record.  [name] pertanent information unless stated regarding to the chief complaint.   Review of Systems:  [name] headache, visual changes, nausea, vomiting, diarrhea, constipation, dizziness, abdominal pain, skin rash, fevers, chills, night sweats, weight loss, swollen lymph nodes, body aches, joint swelling, muscle aches, chest pain, shortness of breath, mood changes.   Objective  Blood pressure 130/68, pulse 78, height 5\' 2"  (1.575 m), weight 180 lb (81.6 kg), SpO2 96 %.    General: [name] apparent distress alert and oriented x3 mood and affect normal, dressed appropriately.  HEENT: Pupils equal, extraocular movements intact  Respiratory: Patient's speak in full sentences and does not appear short of breath  Cardiovascular: [name] lower extremity edema, non tender, [name] erythema  Skin: Warm dry intact with [name] signs of infection or rash on extremities or on axial skeleton.  Abdomen: Soft nontender  Neuro: Cranial nerves II through XII are intact, neurovascularly intact in all extremities with 2+ DTRs and 2+ pulses.  Lymph: [name] lymphadenopathy of posterior or anterior cervical chain or axillae bilaterally.  Gait normal with good balance and coordination.  MSK:  Non tender with full range of motion  and good stability and symmetric strength and tone of shoulders, elbows, wrist, , knee and ankles bilaterally.  RKY:HCWCB hip  ROM IR: 15 Deg, ER: 25 Deg, Flexion: 120 Deg, Extension: 100 Deg, Abduction: 45 Deg, Adduction: 20 Deg Strength IR: 5/5, ER: 5/5, Flexion: 5/5, Extension: 5/5, Abduction: 5/5, Adduction: 5/5 Pelvic alignment unremarkable to inspection and palpation. Standing hip rotation and gait without trendelenburg sign / unsteadiness. Greater trochanter without tenderness to palpation. NPositive FABER  Pain over the piriformis  [name] SI joint tenderness and normal minimal SI movement.  Procedure: Real-time Ultrasound Guided Injection of right piriformis tendon sheath Device: GE Logiq Q7 Ultrasound guided injection is preferred based studies that show increased duration, increased effect, greater accuracy, decreased procedural pain, increased response rate, and decreased cost with ultrasound guided versus blind injection.  Verbal informed consent obtained.  Time-out conducted.  Noted [name] overlying erythema, induration, or other signs of local infection.  Skin prepped in a sterile fashion.  Local anesthesia: Topical Ethyl chloride.  With sterile technique and under real time ultrasound guidance: With a 21-gauge 2 inch needle patient was injected with 1 cc of 0.5% Marcaine and 1 cc of Kenalog 40 mg/mL Completed without difficulty  Pain immediately resolved suggesting accurate placement of the medication.  Advised to call if fevers/chills, erythema, induration, drainage, or persistent bleeding.  Images permanently stored and available for review in the ultrasound unit.  Impression: Technically successful ultrasound guided injection.  [id number]; 15 additional minutes spent for Therapeutic exercises as stated in above notes.  This included exercises focusing on stretching, strengthening, with significant focus on eccentric aspects.   Long term goals include an improvement in range of  motion, strength, endurance as well as avoiding reinjury. Patient's frequency would include in 1-2 times a day, 3-5 times a week for a duration of 6-12 weeks. Hip strengthening exercises which included:  Pelvic tilt/bracing to help with proper recruitment of the lower abs and pelvic floor muscles  Glute strengthening to properly contract glutes without over-engaging low back and hamstrings - prone hip extension and glute bridge exercises Proper stretching techniques to increase effectiveness for the hip flexors, groin, quads, piriformic and low back when appropriate     Proper technique shown and discussed handout in great detail with ATC.  All questions were discussed and answered.  Impression and Recommendations:     This case required medical decision making of moderate complexity. The above documentation has been reviewed and is accurate and complete Lyndal Pulley, DO       Note: This dictation was prepared with Dragon dictation along with smaller phrase technology. Any transcriptional errors that result from this process are unintentional.

## 2018-10-04 ENCOUNTER — Ambulatory Visit: Payer: Self-pay

## 2018-10-04 ENCOUNTER — Ambulatory Visit (INDEPENDENT_AMBULATORY_CARE_PROVIDER_SITE_OTHER): Payer: Self-pay | Admitting: Family Medicine

## 2018-10-04 ENCOUNTER — Other Ambulatory Visit: Payer: Self-pay

## 2018-10-04 ENCOUNTER — Encounter: Payer: Self-pay | Admitting: Family Medicine

## 2018-10-04 ENCOUNTER — Ambulatory Visit: Payer: Self-pay | Admitting: Neurology

## 2018-10-04 VITALS — BP 130/68 | HR 78 | Ht 62.0 in | Wt 180.0 lb

## 2018-10-04 DIAGNOSIS — M25551 Pain in right hip: Secondary | ICD-10-CM

## 2018-10-04 DIAGNOSIS — G5701 Lesion of sciatic nerve, right lower limb: Secondary | ICD-10-CM | POA: Insufficient documentation

## 2018-10-04 NOTE — Assessment & Plan Note (Signed)
Patient given injection today.  Discussed icing regimen and home exercises.  Discussed hip abductor strengthening.  Discussed the importance of weight loss.  Differential includes a lumbar radiculopathy and will consider advanced imaging for future consideration.  Patient will try conservative therapy and follow-up in 4 weeks

## 2018-10-04 NOTE — Patient Instructions (Addendum)
Good to see you  Injected the piriformis today  Ice is your friend Ice 20 minutes 2 times daily. Usually after activity and before bed. Stay active Exercises 3 times a week.   Tennis ball in back right pocket with sitting.  Vitamin D 4000 IU daily  See me again in 4-6 weeks

## 2018-10-06 ENCOUNTER — Telehealth: Payer: Self-pay | Admitting: Family

## 2018-10-06 ENCOUNTER — Other Ambulatory Visit: Payer: Self-pay | Admitting: Family

## 2018-10-06 MED ORDER — LISINOPRIL 10 MG PO TABS: 10.0000 mg | ORAL_TABLET | Freq: Every day | ORAL | 0 refills | Status: DC

## 2018-10-06 NOTE — Telephone Encounter (Signed)
Patient advised.

## 2018-10-06 NOTE — Telephone Encounter (Signed)
Medication Refill - Medication: Lisinopril  Has the patient contacted their pharmacy? Yes.   Pt called stating the amlodipine she recently started was breaking her out in hives and that she would like to be put back on lisinopril. Pt also states metformin is causing her heart rate to go up, causing her to sleep more, and her fingers on her left hand not to bend. Please advise. (Agent: If no, request that the patient contact the pharmacy for the refill.) (Agent: If yes, when and what did the pharmacy advise?)  Preferred Pharmacy (with phone number or street name):  [id number] - [place], [place] [address]  [address] [place] [address]  Phone: [phone] Fax: [phone]  Not a 24 hour pharmacy; exact hours not known.     Agent: Please be advised that RX refills may take up to 3 business days. We ask that you follow-up with your pharmacy.

## 2018-10-06 NOTE — Telephone Encounter (Signed)
Tried to reach patient but her VM has not been set up yet so unable to leave a message. Created CRM incase she calls back.

## 2018-10-06 NOTE — Telephone Encounter (Signed)
Will list Amlodipine and Metformin as an allergy;  will send in refill for the Lisinopril.

## 2018-10-18 ENCOUNTER — Encounter: Payer: Self-pay | Admitting: Family

## 2018-10-18 ENCOUNTER — Ambulatory Visit (INDEPENDENT_AMBULATORY_CARE_PROVIDER_SITE_OTHER): Payer: Self-pay | Admitting: Family

## 2018-10-18 ENCOUNTER — Ambulatory Visit: Payer: Self-pay | Admitting: Family

## 2018-10-18 DIAGNOSIS — E118 Type 2 diabetes mellitus with unspecified complications: Secondary | ICD-10-CM

## 2018-10-18 DIAGNOSIS — I10 Essential (primary) hypertension: Secondary | ICD-10-CM

## 2018-10-18 NOTE — Progress Notes (Signed)
Katrina [name] is a 63 y.o. female with the following history as recorded in EpicCare:  Patient Active Problem List   Diagnosis Date Noted  . Piriformis syndrome of right side 10/04/2018  . Abnormal electrocardiogram (ECG) (EKG) 08/22/2018  . Prediabetes 08/22/2018  . Hyperlipidemia with target LDL less than 130 08/22/2018  . Type II diabetes mellitus with manifestations ([place]) 08/22/2018  . Esophageal stricture   . Obesity (BMI 30.0-34.9)   . Lobar pneumonia ([place])   . Hypertension 09/27/2017  . 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 Low back pain associated with a spinal disorder other than radiculopathy or spinal stenosis 03/01/2016  . Myalgia 10/09/2015  . Upper airway cough syndrome 01/26/2015  . Obesity (BMI 30-39.9) 01/26/2015  . Pulmonary nodules   . DOE (dyspnea on exertion) 01/01/2015  . Pulmonary sarcoidosis ([place])   . Mediastinal lymphadenopathy 12/26/2014  . Esophageal ring, acquired 11/10/2012  . Dysphagia, unspecified(787.20) 11/09/2012  . Hypothyroidism 11/08/2012  . 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 Hypothyroidism, acquired 12/28/2007  . GERD 12/28/2007  . 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 albuterol (PROVENTIL HFA;VENTOLIN HFA) 108 (90 Base) MCG/ACT inhaler Inhale 1-2 puffs into the lungs every 6 (six) hours as needed for wheezing or shortness of breath. 1 Inhaler 0  . albuterol (PROVENTIL HFA;VENTOLIN HFA) 108 (90 Base) MCG/ACT inhaler Inhale 2 puffs into the lungs every 4 (four) hours as needed for wheezing or shortness of breath. 1 Inhaler 0  . aspirin EC 81 MG tablet Take 1 tablet (81 mg total) by mouth daily. 90 tablet 1  . aspirin-acetaminophen-caffeine (EXCEDRIN MIGRAINE) 250-250-65 MG tablet Take 1 tablet by mouth every 6 (six) hours as needed for headache.    . diphenhydrAMINE (BENADRYL) 25 MG tablet Take 1 tablet (25 mg total) by mouth  every 8 (eight) hours as needed. 20 tablet 0  . guaiFENesin (MUCINEX) 600 MG 12 hr tablet Take 1 tablet (600 mg total) by mouth 2 (two) times daily. 60 tablet 0  . ibuprofen (ADVIL) 200 MG tablet Take 200 mg by mouth every 6 (six) hours as needed for headache or mild pain.    Marland Kitchen levothyroxine (SYNTHROID) 150 MCG tablet Take 1 tablet (150 mcg total) by mouth daily before breakfast. 90 tablet 0  . lisinopril (ZESTRIL) 10 MG tablet Take 1 tablet (10 mg total) by mouth daily. 90 tablet 0  . omeprazole (PRILOSEC) 20 MG capsule Take 20 mg by mouth daily.    . polyvinyl alcohol (LIQUIFILM TEARS) 1.4 % ophthalmic solution Place 1 drop into both eyes as needed for dry eyes (itching/allergies).    . rosuvastatin (CRESTOR) 10 MG tablet Take 1 tablet (10 mg total) by mouth daily. 90 tablet 1  . saccharomyces boulardii (FLORASTOR) 250 MG capsule Take 1 capsule (250 mg total) by mouth 2 (two) times daily. (Patient taking differently: Take 250 mg by mouth daily. ) 60 capsule 0  . sucralfate (CARAFATE) 1 g tablet Take 1 tablet (1 g total) by mouth 4 (four) times daily -  with meals and at bedtime. 30 tablet 1   No current facility-administered medications for this visit.     Allergies: Penicillins, Amlodipine, Metformin and related, and Bactrim ds [sulfamethoxazole-trimethoprim]  Past Medical History:  Diagnosis Date  . BCC (basal cell carcinoma of skin) Infundibulocystic 03/11/2008   Inner Left Eye  . Chest pain at rest 11/07/2012  . Diabetes  mellitus without complication ([place])   . Eosinophilic esophagitis 39/53/2023  . Esophageal stricture   . Exercise-induced asthma   . GERD (gastroesophageal reflux disease)   . Hypertension 09/27/2017  . Hypothyroidism   . Sarcoidosis   . Shortness of breath 11/08/2012   RECENTLY  & WHEN i EXERCISE"  . Solar lentigo 03/11/2008   Back - Atypical  . Thyroid disease   . Upper airway cough syndrome     Past Surgical History:  Procedure Laterality Date  . ABDOMINAL  HYSTERECTOMY     Partial  . ESOPHAGOGASTRODUODENOSCOPY (EGD) WITH ESOPHAGEAL DILATION N/A 11/10/2012   Procedure: ESOPHAGOGASTRODUODENOSCOPY (EGD) WITH ESOPHAGEAL DILATION;  Surgeon: Gatha Mayer, MD;  Location: [facility];  Service: Endoscopy;  Laterality: N/A;  [place] vs. Balloon  . VIDEO BRONCHOSCOPY WITH ENDOBRONCHIAL ULTRASOUND Bilateral 01/15/2015   Procedure: VIDEO BRONCHOSCOPY WITH ENDOBRONCHIAL ULTRASOUND of  LEFT LUNG LOWER LOBE and right lung;  Surgeon: Collene Gobble, MD;  [facility];  Service: Thoracic;  Laterality: Bilateral;    Family History  Problem Relation Age of Onset  . Diabetes Mother   . Stroke Mother   . Coronary artery disease Father   . Coronary artery disease Brother   . Hypothyroidism Maternal Grandmother   . Diabetes Maternal Grandfather   . Heart disease Paternal Grandmother   . Heart disease Paternal Grandfather     Social History   Tobacco Use  . Smoking status: Never Smoker  . Smokeless tobacco: Never Used  Substance Use Topics  . Alcohol use: No    Subjective:    I connected with Katrina [name] on 10/18/18 at 10:00 AM EDT by a video enabled telemedicine application and verified that I am speaking with the correct person using two identifiers. Patient and I are the only 2 on the video call.   I discussed the limitations of evaluation and management by telemedicine and the availability of in person appointments. The patient expressed understanding and agreed to proceed.   Follow-up on hypertension; has gone back on Lisinopril due to allergic reaction to Amlodipine; tolerating well and notes that blood pressure is averaging 130/ 80; Denies any chest pain, shortness of breath, blurred vision or headache.  Also could not tolerate the Metformin that was started at last OV; caused her joints to swell; Hgba1c was at 6.8;    Objective:  There were no vitals filed for this visit.  General: Well developed, well nourished, in no acute distress   Skin : Warm and dry.  Head: Normocephalic and atraumatic  Lungs: Respirations unlabored; clear to auscultation bilaterally without wheeze, rales, rhonchi  Neurologic: Alert and oriented; speech intact; face symmetrical;   Assessment:  1. Essential hypertension   2. Type II diabetes mellitus with manifestations ([place])     Plan:  1. Stable; continue Lisinopril 10 mg daily; 2. Will hold on medication at this time- continue to work on diet/ exercise; may need to consider medication if control worsens.  No follow-ups on file.  No orders of the defined types were placed in this encounter.   Requested Prescriptions    No prescriptions requested or ordered in this encounter

## 2018-10-31 ENCOUNTER — Ambulatory Visit: Payer: Self-pay | Admitting: Family

## 2018-11-06 ENCOUNTER — Ambulatory Visit: Payer: Self-pay | Admitting: Neurology

## 2018-11-15 ENCOUNTER — Ambulatory Visit: Payer: Self-pay | Admitting: Family Medicine

## 2018-11-17 ENCOUNTER — Other Ambulatory Visit: Payer: Self-pay | Admitting: Obstetrics & Gynecology

## 2018-11-17 ENCOUNTER — Other Ambulatory Visit: Payer: Self-pay | Admitting: Family

## 2018-11-17 DIAGNOSIS — Z1231 Encounter for screening mammogram for malignant neoplasm of breast: Secondary | ICD-10-CM

## 2018-11-29 ENCOUNTER — Ambulatory Visit (INDEPENDENT_AMBULATORY_CARE_PROVIDER_SITE_OTHER): Payer: Self-pay | Admitting: Family

## 2018-11-29 ENCOUNTER — Encounter: Payer: Self-pay | Admitting: Family

## 2018-11-29 ENCOUNTER — Ambulatory Visit: Payer: Self-pay | Admitting: Family

## 2018-11-29 ENCOUNTER — Other Ambulatory Visit: Payer: Self-pay

## 2018-11-29 ENCOUNTER — Other Ambulatory Visit (INDEPENDENT_AMBULATORY_CARE_PROVIDER_SITE_OTHER): Payer: Self-pay

## 2018-11-29 VITALS — BP 130/88 | HR 68 | Temp 97.8°F | Ht 62.0 in | Wt 202.1 lb

## 2018-11-29 DIAGNOSIS — R1013 Epigastric pain: Secondary | ICD-10-CM

## 2018-11-29 DIAGNOSIS — E118 Type 2 diabetes mellitus with unspecified complications: Secondary | ICD-10-CM

## 2018-11-29 DIAGNOSIS — R079 Chest pain, unspecified: Secondary | ICD-10-CM

## 2018-11-29 LAB — HEMOGLOBIN A1C: Hgb A1c MFr Bld: 7 % — ABNORMAL HIGH (ref 4.6–6.5)

## 2018-11-29 MED ORDER — LEVOTHYROXINE SODIUM 150 MCG PO TABS: 150.0000 ug | ORAL_TABLET | Freq: Every day | ORAL | 0 refills | Status: DC

## 2018-11-29 NOTE — Telephone Encounter (Signed)
appt scheduled for 11/20

## 2018-11-29 NOTE — Progress Notes (Signed)
Katrina [name] is a 63 y.o. female with the following history as recorded in EpicCare:  Patient Active Problem List   Diagnosis Date Noted  . Piriformis syndrome of right side 10/04/2018  . Abnormal electrocardiogram (ECG) (EKG) 08/22/2018  . Prediabetes 08/22/2018  . Hyperlipidemia with target LDL less than 130 08/22/2018  . Type II diabetes mellitus with manifestations ([place]) 08/22/2018  . Esophageal stricture   . Obesity (BMI 30.0-34.9)   . Lobar pneumonia ([place])   . Hypertension 09/27/2017  . 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 Low back pain associated with a spinal disorder other than radiculopathy or spinal stenosis 03/01/2016  . Myalgia 10/09/2015  . Upper airway cough syndrome 01/26/2015  . Obesity (BMI 30-39.9) 01/26/2015  . Pulmonary nodules   . DOE (dyspnea on exertion) 01/01/2015  . Pulmonary sarcoidosis ([place])   . Mediastinal lymphadenopathy 12/26/2014  . Esophageal ring, acquired 11/10/2012  . Dysphagia, unspecified(787.20) 11/09/2012  . Hypothyroidism 11/08/2012  . 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 Hypothyroidism, acquired 12/28/2007  . GERD 12/28/2007  . 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 albuterol (PROVENTIL HFA;VENTOLIN HFA) 108 (90 Base) MCG/ACT inhaler Inhale 1-2 puffs into the lungs every 6 (six) hours as needed for wheezing or shortness of breath. 1 Inhaler 0  . albuterol (PROVENTIL HFA;VENTOLIN HFA) 108 (90 Base) MCG/ACT inhaler Inhale 2 puffs into the lungs every 4 (four) hours as needed for wheezing or shortness of breath. 1 Inhaler 0  . aspirin-acetaminophen-caffeine (EXCEDRIN MIGRAINE) 250-250-65 MG tablet Take 1 tablet by mouth every 6 (six) hours as needed for headache.    . diphenhydrAMINE (BENADRYL) 25 MG tablet Take 1 tablet (25 mg total) by mouth every 8 (eight) hours as needed. 20 tablet 0  . guaiFENesin (MUCINEX) 600 MG 12 hr  tablet Take 1 tablet (600 mg total) by mouth 2 (two) times daily. 60 tablet 0  . ibuprofen (ADVIL) 200 MG tablet Take 200 mg by mouth every 6 (six) hours as needed for headache or mild pain.    Marland Kitchen levothyroxine (SYNTHROID) 150 MCG tablet Take 1 tablet (150 mcg total) by mouth daily before breakfast. 90 tablet 0  . lisinopril (ZESTRIL) 10 MG tablet Take 1 tablet (10 mg total) by mouth daily. 90 tablet 0  . omeprazole (PRILOSEC) 20 MG capsule Take 20 mg by mouth daily.    . polyvinyl alcohol (LIQUIFILM TEARS) 1.4 % ophthalmic solution Place 1 drop into both eyes as needed for dry eyes (itching/allergies).    . saccharomyces boulardii (FLORASTOR) 250 MG capsule Take 1 capsule (250 mg total) by mouth 2 (two) times daily. (Patient taking differently: Take 250 mg by mouth daily. ) 60 capsule 0  . sucralfate (CARAFATE) 1 g tablet Take 1 tablet (1 g total) by mouth 4 (four) times daily -  with meals and at bedtime. 30 tablet 1  . aspirin EC 81 MG tablet Take 1 tablet (81 mg total) by mouth daily. (Patient not taking: Reported on 11/29/2018) 90 tablet 1  . rosuvastatin (CRESTOR) 10 MG tablet Take 1 tablet (10 mg total) by mouth daily. (Patient not taking: Reported on 11/29/2018) 90 tablet 1   No current facility-administered medications for this visit.     Allergies: Penicillins, Amlodipine, Metformin and related, and Bactrim ds [sulfamethoxazole-trimethoprim]  Past Medical History:  Diagnosis Date  . BCC (basal cell carcinoma of skin) Infundibulocystic 03/11/2008   Inner  Left Eye  . Chest pain at rest 11/07/2012  . Diabetes mellitus without complication ([place])   . Eosinophilic esophagitis 16/01/9603  . Esophageal stricture   . Exercise-induced asthma   . GERD (gastroesophageal reflux disease)   . Hypertension 09/27/2017  . Hypothyroidism   . Sarcoidosis   . Shortness of breath 11/08/2012   RECENTLY  & WHEN i EXERCISE"  . Solar lentigo 03/11/2008   Back - Atypical  . Thyroid disease   . Upper airway  cough syndrome     Past Surgical History:  Procedure Laterality Date  . ABDOMINAL HYSTERECTOMY     Partial  . ESOPHAGOGASTRODUODENOSCOPY (EGD) WITH ESOPHAGEAL DILATION N/A 11/10/2012   Procedure: ESOPHAGOGASTRODUODENOSCOPY (EGD) WITH ESOPHAGEAL DILATION;  Surgeon: Gatha Mayer, MD;  Location: [facility];  Service: Endoscopy;  Laterality: N/A;  [place] vs. Balloon  . VIDEO BRONCHOSCOPY WITH ENDOBRONCHIAL ULTRASOUND Bilateral 01/15/2015   Procedure: VIDEO BRONCHOSCOPY WITH ENDOBRONCHIAL ULTRASOUND of  LEFT LUNG LOWER LOBE and right lung;  Surgeon: Collene Gobble, MD;  [facility];  Service: Thoracic;  Laterality: Bilateral;    Family History  Problem Relation Age of Onset  . Diabetes Mother   . Stroke Mother   . Coronary artery disease Father   . Coronary artery disease Brother   . Hypothyroidism Maternal Grandmother   . Diabetes Maternal Grandfather   . Heart disease Paternal Grandmother   . Heart disease Paternal Grandfather     Social History   Tobacco Use  . Smoking status: Never Smoker  . Smokeless tobacco: Never Used  Substance Use Topics  . Alcohol use: No    Subjective:  Patient presents with concerns for abdominal/ chest pain x 5 days; feels like a "spasm" through the center of her chest; has been referred to cardiology for similar symptoms numerous times in the past year and has not been able to follow-up due to cost concerns; Notes that symptoms are worse after eating/ pain is localized in the center of her chest; has gotten relief with Gas-X; Admits that stress level has been high recently- sister recently passed away;  Takes Prilosec regularly- has been given Carafate in the past but admits not taking regulalry;      Objective:  Vitals:   11/29/18 1126  BP: 130/88  Pulse: 68  Temp: 97.8 F (36.6 C)  TempSrc: Oral  SpO2: 96%  Weight: 202 lb 1.3 oz (91.7 kg)  Height: 5\' 2"  (1.575 m)    General: Well developed, well nourished, in no acute distress  Skin  : Warm and dry.  Head: Normocephalic and atraumatic  Eyes: Sclera and conjunctiva clear; pupils round and reactive to light; extraocular movements intact  Ears: External normal; canals clear; tympanic membranes normal  Oropharynx: Pink, supple. No suspicious lesions  Neck: Supple without thyromegaly, adenopathy  Lungs: Respirations unlabored; clear to auscultation bilaterally without wheeze, rales, rhonchi  CVS exam: normal rate and regular rhythm.  Abdomen: Soft; nontender; nondistended; normoactive bowel sounds; no masses or hepatosplenomegaly  Neurologic: Alert and oriented; speech intact; face symmetrical; moves all extremities well; CNII-XII intact without focal deficit   Assessment:  1. Chest pain, unspecified type   2. Epigastric pain   3. Type II diabetes mellitus with manifestations ([place])     Plan:  Update EKG- NSR;  Suspect today's symptoms are GI related due to recent increased stress/ anxiety- try increasing Prilosec to 20 mg bid; may need to consider imaging but again cost is a concern for patient and defers  testing unless absolutely necessary; I did stress again that I really would like her to get her cardiology work up completed- her symptoms are concerning and there could be a cardiac component. She agrees to reach out and discuss payment plan options to get scheduled for test.  Repeat Hgba1c today- could not tolerate Metformin; will need to consider affordable treatment options. Refill on Synthroid;    No follow-ups on file.  Orders Placed This Encounter  Procedures  . HgB A1c    Standing Status:   Future    Number of Occurrences:   1    Standing Expiration Date:   11/29/2019  . EKG 12-Lead    Requested Prescriptions   Signed Prescriptions Disp Refills  . levothyroxine (SYNTHROID) 150 MCG tablet 90 tablet 0    Sig: Take 1 tablet (150 mcg total) by mouth daily before breakfast.

## 2018-11-29 NOTE — Telephone Encounter (Signed)
Pt. Reports she has had upper abdominal pain since Saturday. Hurts mainly after eating. Antacids have "helped some." States she has recently lost her sister and " doesn't know if this has something to do with it." Request a visit. Please advise pt.   Answer Assessment - Initial Assessment Questions 1. LOCATION: "Where does it hurt?"      Upper abdominal  2. RADIATION: "Does the pain shoot anywhere else?" (e.g., chest, back)     Into chest after eating 3. ONSET: "When did the pain begin?" (e.g., minutes, hours or days ago)      Saturday 4. SUDDEN: "Gradual or sudden onset?"     Sudden 5. PATTERN "Does the pain come and go, or is it constant?"    - If constant: "Is it getting better, staying the same, or worsening?"      (Note: Constant means the pain never goes away completely; most serious pain is constant and it progresses)     - If intermittent: "How long does it last?" "Do you have pain now?"     (Note: Intermittent means the pain goes away completely between bouts)     Comes and goes 6. SEVERITY: "How bad is the pain?"  (e.g., Scale 1-10; mild, moderate, or severe)   - MILD (1-3): doesn't interfere with normal activities, abdomen soft and not tender to touch    - MODERATE (4-7): interferes with normal activities or awakens from sleep, tender to touch    - SEVERE (8-10): excruciating pain, doubled over, unable to do any normal activities      Mild to moderate 7. RECURRENT SYMPTOM: "Have you ever had this type of abdominal pain before?" If so, ask: "When was the last time?" and "What happened that time?"      No 8. CAUSE: "What do you think is causing the abdominal pain?"     Unsure 9. RELIEVING/AGGRAVATING FACTORS: "What makes it better or worse?" (e.g., movement, antacids, bowel movement)     Antacids helped some 10. OTHER SYMPTOMS: "Has there been any vomiting, diarrhea, constipation, or urine problems?"       No 11. PREGNANCY: "Is there any chance you are pregnant?" "When was your  last menstrual period?"       No  Protocols used: ABDOMINAL PAIN - [facility]

## 2018-12-01 ENCOUNTER — Other Ambulatory Visit: Payer: Self-pay | Admitting: Family

## 2018-12-01 MED ORDER — SITAGLIPTIN PHOSPHATE 100 MG PO TABS: 100.0000 mg | ORAL_TABLET | Freq: Every day | ORAL | 2 refills | Status: DC

## 2018-12-04 ENCOUNTER — Telehealth: Payer: Self-pay | Admitting: Family

## 2018-12-04 ENCOUNTER — Other Ambulatory Visit: Payer: Self-pay | Admitting: Family

## 2018-12-04 NOTE — Telephone Encounter (Deleted)
We can try Amaryl; this is the least expensive diabetes treatment other than Metformin. It is not what I want to keep her on long term but I understand that the costs of medication can be prohibitive.

## 2018-12-04 NOTE — Telephone Encounter (Signed)
Copied from [place] [id number]. Topic: Quick Communication - Rx Refill/Question >> Dec 04, 2018  9:40 AM Parke Poisson wrote: Medication sitaGLIPtin (JANUVIA) 100 MG tablet   Has the patient contacted their pharmacy? Yes  (Agent: If yes, when and what did the pharmacy advise?)Call office. Medication is $ 360 for a month supply. She would like different medication called in. (Can't take Metformin)  Preferred Pharmacy (with phone number or street name): [id number] - [place], [address] AT [address] [phone] (Phone) [phone] (Fax)    Agent: Please be advised that RX refills may take up to 3 business days. We ask that you follow-up with your pharmacy.

## 2018-12-06 ENCOUNTER — Other Ambulatory Visit: Payer: Self-pay

## 2018-12-06 ENCOUNTER — Emergency Department (HOSPITAL_BASED_OUTPATIENT_CLINIC_OR_DEPARTMENT_OTHER)
Admission: EM | Admit: 2018-12-06 | Discharge: 2018-12-06 | Disposition: A | Discharge status: Home / Self Care | Payer: Self-pay | Attending: Emergency Medicine | Admitting: Emergency Medicine

## 2018-12-06 ENCOUNTER — Emergency Department (HOSPITAL_BASED_OUTPATIENT_CLINIC_OR_DEPARTMENT_OTHER): Payer: Self-pay

## 2018-12-06 ENCOUNTER — Encounter (HOSPITAL_BASED_OUTPATIENT_CLINIC_OR_DEPARTMENT_OTHER): Payer: Self-pay | Admitting: Emergency Medicine

## 2018-12-06 DIAGNOSIS — E119 Type 2 diabetes mellitus without complications: Secondary | ICD-10-CM | POA: Insufficient documentation

## 2018-12-06 DIAGNOSIS — Z79899 Other long term (current) drug therapy: Secondary | ICD-10-CM | POA: Insufficient documentation

## 2018-12-06 DIAGNOSIS — Z7982 Long term (current) use of aspirin: Secondary | ICD-10-CM | POA: Insufficient documentation

## 2018-12-06 DIAGNOSIS — R0789 Other chest pain: Secondary | ICD-10-CM | POA: Insufficient documentation

## 2018-12-06 DIAGNOSIS — I10 Essential (primary) hypertension: Secondary | ICD-10-CM | POA: Insufficient documentation

## 2018-12-06 DIAGNOSIS — Z85828 Personal history of other malignant neoplasm of skin: Secondary | ICD-10-CM | POA: Insufficient documentation

## 2018-12-06 DIAGNOSIS — R079 Chest pain, unspecified: Secondary | ICD-10-CM

## 2018-12-06 DIAGNOSIS — E039 Hypothyroidism, unspecified: Secondary | ICD-10-CM | POA: Insufficient documentation

## 2018-12-06 LAB — BASIC METABOLIC PANEL
Anion gap: 9 (ref 5–15)
BUN: 13 mg/dL (ref 8–23)
CO2: 26 mmol/L (ref 22–32)
Calcium: 8.8 mg/dL — ABNORMAL LOW (ref 8.9–10.3)
Chloride: 102 mmol/L (ref 98–111)
Creatinine, Ser: 0.8 mg/dL (ref 0.44–1.00)
GFR calc Af Amer: >60 mL/min (ref >60)
GFR calc non Af Amer: >60 mL/min (ref >60)
Glucose, Bld: 152 mg/dL — ABNORMAL HIGH (ref 70–99)
Potassium: 3.7 mmol/L (ref 3.5–5.1)
Sodium: 137 mmol/L (ref 135–145)

## 2018-12-06 LAB — CBC
HCT: 41.6 % (ref 36.0–46.0)
Hemoglobin: 13.7 g/dL (ref 12.0–15.0)
MCH: 28 pg (ref 26.0–34.0)
MCHC: 32.9 g/dL (ref 30.0–36.0)
MCV: 85.1 fL (ref 80.0–100.0)
Platelets: 266 10*3/uL (ref 150–400)
RBC: 4.89 MIL/uL (ref 3.87–5.11)
RDW: 13.8 % (ref 11.5–15.5)
WBC: 9.6 10*3/uL (ref 4.0–10.5)
nRBC: 0 % (ref 0.0–0.2)

## 2018-12-06 LAB — HEPATIC FUNCTION PANEL
ALT: 27 U/L (ref 0–44)
AST: 22 U/L (ref 15–41)
Albumin: 3.8 g/dL (ref 3.5–5.0)
Alkaline Phosphatase: 79 U/L (ref 38–126)
Bilirubin, Direct: 0.1 mg/dL (ref 0.0–0.2)
Indirect Bilirubin: 0.5 mg/dL (ref 0.3–0.9)
Total Bilirubin: 0.6 mg/dL (ref 0.3–1.2)
Total Protein: 7.1 g/dL (ref 6.5–8.1)

## 2018-12-06 LAB — TROPONIN I (HIGH SENSITIVITY)
Troponin I (High Sensitivity): 3 ng/L (ref <18)
Troponin I (High Sensitivity): 3 ng/L (ref <18)

## 2018-12-06 MED ORDER — SODIUM CHLORIDE 0.9% FLUSH
3.0000 mL | Freq: Once | INTRAVENOUS | Status: DC
  Filled 2018-12-06: qty 3

## 2018-12-06 NOTE — Discharge Instructions (Addendum)
You were seen in the emergency department for intermittent chest pain that is been going on for a few weeks.  You had an EKG blood work and a chest x-ray that did not show any serious findings but it is still concerning and will need close follow-up.  Cardiology should call you tomorrow but you should also try the office to schedule an appointment as soon as possible.  If you have any worsening symptoms please return to the emergency department.

## 2018-12-06 NOTE — ED Triage Notes (Signed)
Pt reports central chest pain radiating to right and left chest, radiating to left neck, between shoulder blades x 2 weeks, saw PCP. Normal EKG last week. Also reports increasing chest pain and shortness of breath when walking/excersing. Alert and oriented x 4.

## 2018-12-06 NOTE — ED Provider Notes (Signed)
[facility] EMERGENCY DEPARTMENT Provider Note   CSN: [id number] Arrival date & time: 12/06/18  1719     History   Chief Complaint Chief Complaint  Patient presents with  . Chest Pain    HPI Katrina [name] is a 63 y.o. female.  She has history of diabetes hypertension high cholesterol but no known coronary disease.  She is complaining of 2 weeks of intermittent substernal chest pain and pressure that radiates to her left shoulder.  It is been intermittent in nature but seems to be provoked by exertion.  She rates it as an 8 out of 10 and currently is 5 out of 10.  She feels a little short of breath with it and a little fatigued.  No diaphoresis or nausea.  May be a little lightheaded.  She seen her PCP for this and had an unremarkable EKG but her symptoms have continued.  No prior cardiac testing.     The history is provided by the patient.  Chest Pain Pain location:  Substernal area Pain quality: aching and pressure   Pain radiates to:  L shoulder Pain severity:  Moderate Onset quality:  Gradual Duration:  2 weeks Timing:  Intermittent Progression:  Unchanged Chronicity:  New Relieved by:  Nothing Worsened by:  Exertion Ineffective treatments:  None tried Associated symptoms: abdominal pain, back pain, fatigue and shortness of breath   Associated symptoms: no altered mental status, no cough, no diaphoresis, no fever, no headache, no heartburn, no lower extremity edema, no nausea, no syncope and no vomiting   Risk factors: diabetes mellitus, high cholesterol and hypertension   Risk factors: no coronary artery disease and no smoking     Past Medical History:  Diagnosis Date  . BCC (basal cell carcinoma of skin) Infundibulocystic 03/11/2008   Inner Left Eye  . Chest pain at rest 11/07/2012  . Diabetes mellitus without complication ([place])   . Eosinophilic esophagitis 70/35/0093  . Esophageal stricture   . Exercise-induced asthma   . GERD (gastroesophageal reflux  disease)   . Hypertension 09/27/2017  . Hypothyroidism   . Sarcoidosis   . Shortness of breath 11/08/2012   RECENTLY  & WHEN i EXERCISE"  . Solar lentigo 03/11/2008   Back - Atypical  . Thyroid disease   . Upper airway cough syndrome     Patient Active Problem List   Diagnosis Date Noted  . Piriformis syndrome of right side 10/04/2018  . Abnormal electrocardiogram (ECG) (EKG) 08/22/2018  . Prediabetes 08/22/2018  . Hyperlipidemia with target LDL less than 130 08/22/2018  . Type II diabetes mellitus with manifestations ([place]) 08/22/2018  . Esophageal stricture   . Obesity (BMI 30.0-34.9)   . Lobar pneumonia ([place])   . Hypertension 09/27/2017  . 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 Low back pain associated with a spinal disorder other than radiculopathy or spinal stenosis 03/01/2016  . Myalgia 10/09/2015  . Upper airway cough syndrome 01/26/2015  . Obesity (BMI 30-39.9) 01/26/2015  . Pulmonary nodules   . DOE (dyspnea on exertion) 01/01/2015  . Pulmonary sarcoidosis ([place])   . Mediastinal lymphadenopathy 12/26/2014  . Esophageal ring, acquired 11/10/2012  . Dysphagia, unspecified(787.20) 11/09/2012  . Hypothyroidism 11/08/2012  . 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 Hypothyroidism, acquired 12/28/2007  . GERD 12/28/2007  . 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 ABDOMINAL HYSTERECTOMY     Partial  .  ESOPHAGOGASTRODUODENOSCOPY (EGD) WITH ESOPHAGEAL DILATION N/A 11/10/2012   Procedure: ESOPHAGOGASTRODUODENOSCOPY (EGD) WITH ESOPHAGEAL DILATION;  Surgeon: Gatha Mayer, MD;  Location: [facility];  Service: Endoscopy;  Laterality: N/A;  [place] vs. Balloon  . VIDEO BRONCHOSCOPY WITH ENDOBRONCHIAL ULTRASOUND Bilateral 01/15/2015   Procedure: VIDEO BRONCHOSCOPY WITH ENDOBRONCHIAL ULTRASOUND of  LEFT LUNG LOWER LOBE and right lung;  Surgeon: Collene Gobble, MD;  Location: [facility];   Service: Thoracic;  Laterality: Bilateral;     OB History   No obstetric history on file.      Home Medications    Prior to Admission medications   Medication Sig Start Date End Date Taking? Authorizing Provider  albuterol (PROVENTIL HFA;VENTOLIN HFA) 108 (90 Base) MCG/ACT inhaler Inhale 1-2 puffs into the lungs every 6 (six) hours as needed for wheezing or shortness of breath. 04/24/18   Binnie Rail, MD  albuterol (PROVENTIL HFA;VENTOLIN HFA) 108 (90 Base) MCG/ACT inhaler Inhale 2 puffs into the lungs every 4 (four) hours as needed for wheezing or shortness of breath. 05/17/18   Francine Graven, DO  aspirin EC 81 MG tablet Take 1 tablet (81 mg total) by mouth daily. Patient not taking: Reported on 11/29/2018 08/22/18   Janith Lima, MD  aspirin-acetaminophen-caffeine [facility] MIGRAINE) [phone] MG tablet Take 1 tablet by mouth every 6 (six) hours as needed for headache.    [provider]  diphenhydrAMINE (BENADRYL) 25 MG tablet Take 1 tablet (25 mg total) by mouth every 8 (eight) hours as needed. 09/24/18   Horton, Barbette Hair, MD  guaiFENesin (MUCINEX) 600 MG 12 hr tablet Take 1 tablet (600 mg total) by mouth 2 (two) times daily. 05/02/18   Florencia Reasons, MD  ibuprofen (ADVIL) 200 MG tablet Take 200 mg by mouth every 6 (six) hours as needed for headache or mild pain.    [provider]  levothyroxine (SYNTHROID) 150 MCG tablet Take 1 tablet (150 mcg total) by mouth daily before breakfast. 11/29/18   Marrian Salvage, FNP  lisinopril (ZESTRIL) 10 MG tablet Take 1 tablet (10 mg total) by mouth daily. 10/06/18   Marrian Salvage, FNP  omeprazole (PRILOSEC) 20 MG capsule Take 20 mg by mouth daily.    [provider]  polyvinyl alcohol (LIQUIFILM TEARS) 1.4 % ophthalmic solution Place 1 drop into both eyes as needed for dry eyes (itching/allergies).    [provider]  rosuvastatin (CRESTOR) 10 MG tablet Take 1 tablet (10 mg total) by mouth daily.  Patient not taking: Reported on 11/29/2018 08/22/18   Janith Lima, MD  saccharomyces boulardii (FLORASTOR) 250 MG capsule Take 1 capsule (250 mg total) by mouth 2 (two) times daily. Patient taking differently: Take 250 mg by mouth daily.  05/02/18   Florencia Reasons, MD  sucralfate (CARAFATE) 1 g tablet Take 1 tablet (1 g total) by mouth 4 (four) times daily -  with meals and at bedtime. 08/14/18   Dorie Rank, MD    Family History Family History  Problem Relation Age of Onset  . Diabetes Mother   . Stroke Mother   . Coronary artery disease Father   . Coronary artery disease Brother   . Hypothyroidism Maternal Grandmother   . Diabetes Maternal Grandfather   . Heart disease Paternal Grandmother   . Heart disease Paternal Grandfather     Social History Social History   Tobacco Use  . Smoking status: Never Smoker  . Smokeless tobacco: Never Used  Substance Use Topics  . Alcohol use:  No  . Drug use: No     Allergies   Penicillins, Amlodipine, Metformin and related, and Bactrim ds [sulfamethoxazole-trimethoprim]   Review of Systems Review of Systems  Constitutional: Positive for fatigue. Negative for diaphoresis and fever.  HENT: Negative for sore throat.   Eyes: Negative for visual disturbance.  Respiratory: Positive for shortness of breath. Negative for cough.   Cardiovascular: Positive for chest pain. Negative for syncope.  Gastrointestinal: Positive for abdominal pain. Negative for heartburn, nausea and vomiting.  Genitourinary: Negative for dysuria.  Musculoskeletal: Positive for back pain.  Skin: Negative for rash.  Neurological: Negative for headaches.     Physical Exam Updated Vital Signs BP (!) 148/74   Pulse 81   Temp 98.2 F (36.8 C) (Oral)   Resp 18   Ht 5\' 2"  (1.575 m)   Wt 90.7 kg   SpO2 96%   BMI 36.58 kg/m   Physical Exam Vitals signs and nursing note reviewed.  Constitutional:      General: She is not in acute distress.    Appearance: She is  well-developed.  HENT:     Head: Normocephalic and atraumatic.  Eyes:     Conjunctiva/sclera: Conjunctivae normal.  Neck:     Musculoskeletal: Neck supple.  Cardiovascular:     Rate and Rhythm: Normal rate and regular rhythm.     Heart sounds: Normal heart sounds. No murmur.  Pulmonary:     Effort: Pulmonary effort is normal. No respiratory distress.     Breath sounds: Normal breath sounds.  Abdominal:     Palpations: Abdomen is soft.     Tenderness: There is no abdominal tenderness.  Musculoskeletal: Normal range of motion.     Right lower leg: She exhibits no tenderness. No edema.     Left lower leg: She exhibits no tenderness. No edema.  Skin:    General: Skin is warm and dry.     Capillary Refill: Capillary refill takes less than 2 seconds.  Neurological:     General: No focal deficit present.     Mental Status: She is alert.      ED Treatments / Results  Labs (all labs ordered are listed, but only abnormal results are displayed) Labs Reviewed  BASIC METABOLIC PANEL - Abnormal; Notable for the following components:      Result Value   Glucose, Bld 152 (*)    Calcium 8.8 (*)    All other components within normal limits  CBC  HEPATIC FUNCTION PANEL  TROPONIN I (HIGH SENSITIVITY)  TROPONIN I (HIGH SENSITIVITY)    EKG EKG Interpretation  Date/Time:  Wednesday December 06 2018 17:26:33 EDT Ventricular Rate:  78 PR Interval:    QRS Duration: 91 QT Interval:  388 QTC Calculation: 442 R Axis:   48 Text Interpretation:  Sinus rhythm Minimal ST depression, inferior leads Baseline wander in lead(s) I V2 poor baseline will repeat Confirmed by Aletta Edouard [phone]) on 12/06/2018 5:39:09 PM   Radiology Dg Chest 2 View  Result Date: 12/06/2018 CLINICAL DATA:  Two week history of chest pain radiating into both shoulders. EXAM: CHEST - 2 VIEW COMPARISON:  08/14/2018 and earlier. FINDINGS: Cardiac silhouette mildly enlarged, unchanged. Thoracic aorta minimally tortuous,  unchanged. Hilar and mediastinal contours otherwise unremarkable. Lungs clear. Bronchovascular markings normal. Pulmonary vascularity normal. No visible pleural effusions. No pneumothorax. Minimal degenerative changes involving the midthoracic spine. IMPRESSION: Stable mild cardiomegaly.  No acute cardiopulmonary disease. Electronically Signed   By: Evangeline Dakin M.D.   On:  12/06/2018 20:43    Procedures Procedures (including critical care time)  Medications Ordered in ED Medications  sodium chloride flush (NS) 0.9 % injection 3 mL (3 mLs Intravenous Not Given 12/06/18 1731)     Initial Impression / Assessment and Plan / ED Course  I have reviewed the triage vital signs and the nursing notes.  Pertinent labs & imaging results that were available during my care of the patient were reviewed by me and considered in my medical decision making (see chart for details).  Clinical Course as of Dec 06 952  Wed Dec 05, 3789  2464 63 year old female with no prior cardiac history of multiple cardiac risk factors here with what sounds like exertional chest pain radiating to her shoulder and upper back associated with shortness of breath and fatigue that is been intermittent for 2 weeks but be getting progressively worse.  Differential includes ACS, unstable angina, pneumonia, vascular disease, GERD, reflux   [MB]  1809 Patient's chest x-ray reviewed by me no acute findings.  Awaiting radiology review.   [MB]  2004 Radiology has been really backed up and is unable to read her chest x-ray.  I do not see any significant findings on the chest x-ray.  Will review all this with the patient and see what she would like to do from here.   [MB]  2011 Reviewed the results with the patient and told her she should probably be admitted so we can do further testing.  She would rather be discharged and follow-up outpatient with cardiology.  I put a call out to cardiology review this with him and see if we can arrange  some close follow-up for her.  She mentioned that she has been under a lot of stress with her sister passing away and she has been dealing with a lot of family issues.  She wants to know if stress could be causing her symptoms.   [MB]  2026 I reached out to cardiology fellow at, and he took the patient's name down and said he would give it to the administrators and have the office reach out to her tomorrow for close follow-up.  I reviewed this with the patient and also gave her the contact information.   [MB]    Clinical Course User Index [MB] Hayden Rasmussen, MD       Final Clinical Impressions(s) / ED Diagnoses   Final diagnoses:  Nonspecific chest pain    ED Discharge Orders    None       Hayden Rasmussen, MD 12/07/18 [phone]

## 2018-12-06 NOTE — ED Notes (Signed)
Pt voiced to this RN that her sister recently passed and shes had a hard time dealing with this- when she saw PCP she was told she had anxiety and esophageal spasm. Pt mentioned this because she voiced this feel similar.

## 2018-12-06 NOTE — ED Notes (Signed)
ED Provider at bedside. 

## 2018-12-06 NOTE — Telephone Encounter (Signed)
Pt called about the status of a new med in place of Januvia...    Pt also stated that she realized that her chest pains occur when she is in motion, for example doing exercise or go for a walk for exercise.     When Pt tried to walk today she immediately started to have chest pains during the walk/ Please advise

## 2018-12-06 NOTE — ED Notes (Signed)
Patient transported to X-ray 

## 2018-12-06 NOTE — Telephone Encounter (Signed)
Those symptoms are signs of a cardiac issue. She really needs to go to see cardiology. Can you have Jonelle Sidle triage her- we need to make sure she doesn't need to go to the ER? This call also needs to be reported- [facility] needs to be aware that chest pain with activity is a very serious condition and should not be forwarded.  Unfortunately, with her medication allergies and lack of insurance, I don't really have good medication options for her. Her Hgba1c was at 7 so we don't absolutely have to start medication. She will hopefully have her Medicare in place in 3 months and will have more options then.

## 2018-12-06 NOTE — Telephone Encounter (Signed)
Patient states she has extreme pressure across middle of her chest that radiates to her left shoulder, especially when she tried to go walking or movement such as cleaning in her house, she tried to walk earlier today and she had to stop twice because of extreme chest pain----I have advised patient that she needs to seek help at one of our emergency rooms, either [facility] (for heart related issues) or can go to [facility] (which usu has no wait, or less wait), may need to be transported to hospital if she needs to be admitted, but can be seen usu quicker at this emergency room----pt agrees that she needs to go to ED, she will be carrying family member with her to [facility]

## 2018-12-15 ENCOUNTER — Encounter: Payer: Self-pay | Admitting: Family Medicine

## 2018-12-15 ENCOUNTER — Ambulatory Visit (INDEPENDENT_AMBULATORY_CARE_PROVIDER_SITE_OTHER): Payer: Self-pay | Admitting: Family Medicine

## 2018-12-15 ENCOUNTER — Other Ambulatory Visit: Payer: Self-pay

## 2018-12-15 DIAGNOSIS — G5701 Lesion of sciatic nerve, right lower limb: Secondary | ICD-10-CM

## 2018-12-15 MED ORDER — GABAPENTIN 100 MG PO CAPS: 200.0000 mg | ORAL_CAPSULE | Freq: Every day | ORAL | 0 refills | Status: DC

## 2018-12-15 NOTE — Assessment & Plan Note (Signed)
Patient continues to have pain overall.  Patient will start on gabapentin.  We discussed different things that could help with excellent with the weight loss as well but will be beneficial for her as well as for her diabetes.  At this point I think it is too soon for an injection.  We discussed icing regimen and home exercises.  Follow-up again in 4 to 8 weeks

## 2018-12-15 NOTE — Progress Notes (Signed)
[name] [address] [place], [place] [address] Phone: [phone] Subjective:   [name], am serving as a scribe for Dr. Hulan Saas.  I'm seeing this patient by the request  of:    CC: Right hip pain  [id number]   10/04/2018 Patient given injection today.  Discussed icing regimen and home exercises.  Discussed hip abductor strengthening.  Discussed the importance of weight loss.  Differential includes a lumbar radiculopathy and will consider advanced imaging for future consideration.  Patient will try conservative therapy and follow-up in 4 weeks  Update 12/15/2018 Katrina [name] is a 63 y.o. female coming in with complaint of right hip pain. States that her sugar is high. Cannot take metformin. Was prescribe Januvia which she cannot afford. Is trying to exercise more to get the sugar down. Pain occurring on lateral aspect and into the hamstring.  Patient was found to have more of a piriformis syndrome previously.     Past Medical History:  Diagnosis Date  . BCC (basal cell carcinoma of skin) Infundibulocystic 03/11/2008   Inner Left Eye  . Chest pain at rest 11/07/2012  . Diabetes mellitus without complication ([place])   . Eosinophilic esophagitis 0000000  . Esophageal stricture   . Exercise-induced asthma   . GERD (gastroesophageal reflux disease)   . Hypertension 09/27/2017  . Hypothyroidism   . Sarcoidosis   . Shortness of breath 11/08/2012   RECENTLY  & WHEN i EXERCISE"  . Solar lentigo 03/11/2008   Back - Atypical  . Thyroid disease   . Upper airway cough syndrome    Past Surgical History:  Procedure Laterality Date  . ABDOMINAL HYSTERECTOMY     Partial  . ESOPHAGOGASTRODUODENOSCOPY (EGD) WITH ESOPHAGEAL DILATION N/A 11/10/2012   Procedure: ESOPHAGOGASTRODUODENOSCOPY (EGD) WITH ESOPHAGEAL DILATION;  Surgeon: Gatha Mayer, MD;  Location: [facility];  Service: Endoscopy;  Laterality: N/A;  [place] vs. Balloon  . VIDEO  BRONCHOSCOPY WITH ENDOBRONCHIAL ULTRASOUND Bilateral 01/15/2015   Procedure: VIDEO BRONCHOSCOPY WITH ENDOBRONCHIAL ULTRASOUND of  LEFT LUNG LOWER LOBE and right lung;  Surgeon: Collene Gobble, MD;  Location: [facility];  Service: Thoracic;  Laterality: Bilateral;   Social History   Socioeconomic History  . Marital status: Divorced    Spouse name: Not on file  . Number of children: 3  . Years of education: 53  . Highest education level: Not on file  Occupational History  . Occupation: Unemployed  Social Needs  . Financial resource strain: Not on file  . Food insecurity    Worry: Not on file    Inability: Not on file  . Transportation needs    Medical: Not on file    Non-medical: Not on file  Tobacco Use  . Smoking status: Never Smoker  . Smokeless tobacco: Never Used  Substance and Sexual Activity  . Alcohol use: No  . Drug use: No  . Sexual activity: Not on file  Lifestyle  . Physical activity    Days per week: Not on file    Minutes per session: Not on file  . Stress: Not on file  Relationships  . Social Herbalist on phone: Not on file    Gets together: Not on file    Attends religious service: Not on file    Active member of club or organization: Not on file    Attends meetings of clubs or organizations: Not on file    Relationship status: Not on  file  Other Topics Concern  . Not on file  Social History Narrative   Fun: Anything and everything   Allergies  Allergen Reactions  . Penicillins Shortness Of Breath and Rash    DID THE REACTION INVOLVE: Swelling of the face/tongue/throat, SOB, or low BP? N Sudden or severe rash/hives, skin peeling, or the inside of the mouth or nose? Yes Did it require medical treatment? No When did it last happen?2010 If all above answers are "NO", may proceed with cephalosporin use.   . Amlodipine     Hives  . Metformin And Related     Elevated heart rate  . Bactrim Ds [Sulfamethoxazole-Trimethoprim] Rash    Upper  body and Face redness with heat.     Family History  Problem Relation Age of Onset  . Diabetes Mother   . Stroke Mother   . Coronary artery disease Father   . Coronary artery disease Brother   . Hypothyroidism Maternal Grandmother   . Diabetes Maternal Grandfather   . Heart disease Paternal Grandmother   . Heart disease Paternal Grandfather     Current Outpatient Medications (Endocrine & Metabolic):  .  levothyroxine (SYNTHROID) 150 MCG tablet, Take 1 tablet (150 mcg total) by mouth daily before breakfast.  Current Outpatient Medications (Cardiovascular):  .  lisinopril (ZESTRIL) 10 MG tablet, Take 1 tablet (10 mg total) by mouth daily. .  rosuvastatin (CRESTOR) 10 MG tablet, Take 1 tablet (10 mg total) by mouth daily.  Current Outpatient Medications (Respiratory):  .  albuterol (PROVENTIL HFA;VENTOLIN HFA) 108 (90 Base) MCG/ACT inhaler, Inhale 1-2 puffs into the lungs every 6 (six) hours as needed for wheezing or shortness of breath. Marland Kitchen  albuterol (PROVENTIL HFA;VENTOLIN HFA) 108 (90 Base) MCG/ACT inhaler, Inhale 2 puffs into the lungs every 4 (four) hours as needed for wheezing or shortness of breath. .  diphenhydrAMINE (BENADRYL) 25 MG tablet, Take 1 tablet (25 mg total) by mouth every 8 (eight) hours as needed. Marland Kitchen  guaiFENesin (MUCINEX) 600 MG 12 hr tablet, Take 1 tablet (600 mg total) by mouth 2 (two) times daily.  Current Outpatient Medications (Analgesics):  .  aspirin EC 81 MG tablet, Take 1 tablet (81 mg total) by mouth daily. Marland Kitchen  aspirin-acetaminophen-caffeine (EXCEDRIN MIGRAINE) 250-250-65 MG tablet, Take 1 tablet by mouth every 6 (six) hours as needed for headache. .  ibuprofen (ADVIL) 200 MG tablet, Take 200 mg by mouth every 6 (six) hours as needed for headache or mild pain.   Current Outpatient Medications (Other):  .  omeprazole (PRILOSEC) 20 MG capsule, Take 20 mg by mouth daily. .  polyvinyl alcohol (LIQUIFILM TEARS) 1.4 % ophthalmic solution, Place 1 drop into both  eyes as needed for dry eyes (itching/allergies). .  saccharomyces boulardii (FLORASTOR) 250 MG capsule, Take 1 capsule (250 mg total) by mouth 2 (two) times daily. (Patient taking differently: Take 250 mg by mouth daily. ) .  sucralfate (CARAFATE) 1 g tablet, Take 1 tablet (1 g total) by mouth 4 (four) times daily -  with meals and at bedtime. .  gabapentin (NEURONTIN) 100 MG capsule, Take 2 capsules (200 mg total) by mouth at bedtime.    Past medical history, social, surgical and family history all reviewed in electronic medical record.  No pertanent information unless stated regarding to the chief complaint.   Review of Systems:  No headache, visual changes, nausea, vomiting, diarrhea, constipation, dizziness, abdominal pain, skin rash, fevers, chills, night sweats, weight loss, swollen lymph nodes, body aches, joint  swelling,chest pain, shortness of breath, mood changes.  Positive muscle aches  Objective  Blood pressure 118/82, pulse 74, height 5\' 2"  (1.575 m), weight 204 lb (92.5 kg), SpO2 96 %.    General: No apparent distress alert and oriented x3 mood and affect normal, dressed appropriately.  HEENT: Pupils equal, extraocular movements intact  Respiratory: Patient's speak in full sentences and does not appear short of breath  Cardiovascular: No lower extremity edema, non tender, no erythema  Skin: Warm dry intact with no signs of infection or rash on extremities or on axial skeleton.  Abdomen: Soft nontender  Neuro: Cranial nerves II through XII are intact, neurovascularly intact in all extremities with 2+ DTRs and 2+ pulses.  Lymph: No lymphadenopathy of posterior or anterior cervical chain or axillae bilaterally.  Gait normal with good balance and coordination.  MSK:  Non tender with full range of motion and good stability and symmetric strength and tone of shoulders, elbows, wrist,  knee and ankles bilaterally.  Right hip exam on the right side shows a patient mild pain over the  greater trochanteric area and mild over the piriformis.  Negative straight leg test.  Patient does have some tightness of the paraspinal musculature lumbar spine.    Impression and Recommendations:      The above documentation has been reviewed and is accurate and complete Katrina Pulley, DO       Note: This dictation was prepared with Dragon dictation along with smaller phrase technology. Any transcriptional errors that result from this process are unintentional.

## 2018-12-15 NOTE — Patient Instructions (Addendum)
Ask your primary care about the right medicine for diabetes.  Exercises 3x a week Aqua jogger Calcium pyruvate 1500mg  daily Gabapentin 200mg  at night Ice after activity and before bed 2 cups of water in the morning, once cup before every meal, no carbs after 7 See me again in 2-3 months

## 2018-12-21 ENCOUNTER — Telehealth: Payer: Self-pay | Admitting: *Deleted

## 2018-12-21 DIAGNOSIS — M25551 Pain in right hip: Secondary | ICD-10-CM

## 2018-12-21 NOTE — Telephone Encounter (Signed)
It will take time  If worsening pain need to go to ER

## 2018-12-21 NOTE — Telephone Encounter (Signed)
Pt calling in regarding her medication and is requesting to know about the alternative medication that was supposed to be sent in for her. Please advise.   [id number] - [place], [address]  [address] [place] [address]  Phone: [phone] Fax: [phone]  Not a 24 hour pharmacy; exact hours not known.

## 2018-12-21 NOTE — Telephone Encounter (Signed)
Pt called stating that she is in a lot of pain. She saw Dr. Tamala Julian on Monday and is suddenly in a lot more pain. She was calling to see if she could be seen today or tomorrow. If not, what should she do?

## 2018-12-21 NOTE — Telephone Encounter (Signed)
Spoke with patient today and info given from chart message from 12/04/18. Per [name] with her medication allergies and lack of insurance, she didn't really have good medication options for her. Her Hgba1c was at 7 so [name] didn't think patient absolutely had to start medication at this time. Patient voiced understanding and said she would wait it out.

## 2018-12-22 ENCOUNTER — Ambulatory Visit (INDEPENDENT_AMBULATORY_CARE_PROVIDER_SITE_OTHER)
Admission: RE | Admit: 2018-12-22 | Discharge: 2018-12-22 | Disposition: A | Discharge status: Home / Self Care | Payer: Self-pay | Source: Ambulatory Visit | Attending: Family Medicine | Admitting: Family Medicine

## 2018-12-22 ENCOUNTER — Other Ambulatory Visit: Payer: Self-pay

## 2018-12-22 DIAGNOSIS — M25551 Pain in right hip: Secondary | ICD-10-CM

## 2018-12-22 NOTE — Telephone Encounter (Signed)
Discussed with pt

## 2019-01-03 ENCOUNTER — Other Ambulatory Visit (INDEPENDENT_AMBULATORY_CARE_PROVIDER_SITE_OTHER): Payer: Self-pay

## 2019-01-03 ENCOUNTER — Encounter: Payer: Self-pay | Admitting: Family

## 2019-01-03 ENCOUNTER — Other Ambulatory Visit: Payer: Self-pay

## 2019-01-03 ENCOUNTER — Ambulatory Visit (INDEPENDENT_AMBULATORY_CARE_PROVIDER_SITE_OTHER): Payer: Self-pay | Admitting: Family

## 2019-01-03 VITALS — BP 130/78 | HR 80 | Temp 97.8°F | Ht 62.0 in | Wt 205.0 lb

## 2019-01-03 DIAGNOSIS — E039 Hypothyroidism, unspecified: Secondary | ICD-10-CM

## 2019-01-03 DIAGNOSIS — E118 Type 2 diabetes mellitus with unspecified complications: Secondary | ICD-10-CM

## 2019-01-03 LAB — TSH: TSH: 2.58 u[IU]/mL (ref 0.35–4.50)

## 2019-01-03 MED ORDER — FARXIGA 5 MG PO TABS: 5.0000 mg | ORAL_TABLET | Freq: Every day | ORAL | Status: DC

## 2019-01-03 NOTE — Progress Notes (Signed)
Katrina [name] is a 63 y.o. female with the following history as recorded in [employer]:  Patient Active Problem List   Diagnosis Date Noted  . Piriformis syndrome of right side 10/04/2018  . Abnormal electrocardiogram (ECG) (EKG) 08/22/2018  . Prediabetes 08/22/2018  . Hyperlipidemia with target LDL less than 130 08/22/2018  . Type II diabetes mellitus with manifestations ([place]) 08/22/2018  . Esophageal stricture   . Obesity (BMI 30.0-34.9)   . Lobar pneumonia ([place])   . Hypertension 09/27/2017  . 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 Low back pain associated with a spinal disorder other than radiculopathy or spinal stenosis 03/01/2016  . Myalgia 10/09/2015  . Upper airway cough syndrome 01/26/2015  . Obesity (BMI 30-39.9) 01/26/2015  . Pulmonary nodules   . DOE (dyspnea on exertion) 01/01/2015  . Pulmonary sarcoidosis ([place])   . Mediastinal lymphadenopathy 12/26/2014  . Esophageal ring, acquired 11/10/2012  . Dysphagia, unspecified(787.20) 11/09/2012  . Hypothyroidism 11/08/2012  . 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 Hypothyroidism, acquired 12/28/2007  . GERD 12/28/2007  . 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 levothyroxine (SYNTHROID) 150 MCG tablet Take 1 tablet (150 mcg total) by mouth daily before breakfast. 90 tablet 0  . lisinopril (ZESTRIL) 10 MG tablet Take 1 tablet (10 mg total) by mouth daily. 90 tablet 0  . omeprazole (PRILOSEC) 20 MG capsule Take 20 mg by mouth daily.    . polyvinyl alcohol (LIQUIFILM TEARS) 1.4 % ophthalmic solution Place 1 drop into both eyes as needed for dry eyes (itching/allergies).    . saccharomyces boulardii (FLORASTOR) 250 MG capsule Take 1 capsule (250 mg total) by mouth 2 (two) times daily. (Patient taking differently: Take 250 mg by mouth daily. ) 60 capsule 0  . albuterol (PROVENTIL HFA;VENTOLIN HFA) 108 (90 Base) MCG/ACT  inhaler Inhale 1-2 puffs into the lungs every 6 (six) hours as needed for wheezing or shortness of breath. (Patient not taking: Reported on 01/03/2019) 1 Inhaler 0  . albuterol (PROVENTIL HFA;VENTOLIN HFA) 108 (90 Base) MCG/ACT inhaler Inhale 2 puffs into the lungs every 4 (four) hours as needed for wheezing or shortness of breath. (Patient not taking: Reported on 01/03/2019) 1 Inhaler 0  . aspirin EC 81 MG tablet Take 1 tablet (81 mg total) by mouth daily. (Patient not taking: Reported on 01/03/2019) 90 tablet 1  . aspirin-acetaminophen-caffeine (EXCEDRIN MIGRAINE) 250-250-65 MG tablet Take 1 tablet by mouth every 6 (six) hours as needed for headache.    . dapagliflozin propanediol (FARXIGA) 5 MG TABS tablet Take 5 mg by mouth daily before breakfast. 30 tablet   . diphenhydrAMINE (BENADRYL) 25 MG tablet Take 1 tablet (25 mg total) by mouth every 8 (eight) hours as needed. (Patient not taking: Reported on 01/03/2019) 20 tablet 0  . gabapentin (NEURONTIN) 100 MG capsule Take 2 capsules (200 mg total) by mouth at bedtime. (Patient not taking: Reported on 01/03/2019) 180 capsule 0  . guaiFENesin (MUCINEX) 600 MG 12 hr tablet Take 1 tablet (600 mg total) by mouth 2 (two) times daily. (Patient not taking: Reported on 01/03/2019) 60 tablet 0  . ibuprofen (ADVIL) 200 MG tablet Take 200 mg by mouth every 6 (six) hours as needed for headache or mild pain.    . rosuvastatin (CRESTOR) 10 MG tablet Take 1 tablet (10 mg total) by mouth daily. (Patient not taking: Reported on 01/03/2019) 90 tablet 1  .  sucralfate (CARAFATE) 1 g tablet Take 1 tablet (1 g total) by mouth 4 (four) times daily -  with meals and at bedtime. (Patient not taking: Reported on 01/03/2019) 30 tablet 1   No current facility-administered medications for this visit.     Allergies: Penicillins, Amlodipine, Metformin and related, and Bactrim ds [sulfamethoxazole-trimethoprim]  Past Medical History:  Diagnosis Date  . BCC (basal cell carcinoma of skin)  Infundibulocystic 03/11/2008   Inner Left Eye  . Chest pain at rest 11/07/2012  . Diabetes mellitus without complication ([place])   . Eosinophilic esophagitis 0000000  . Esophageal stricture   . Exercise-induced asthma   . GERD (gastroesophageal reflux disease)   . Hypertension 09/27/2017  . Hypothyroidism   . Sarcoidosis   . Shortness of breath 11/08/2012   RECENTLY  & WHEN i EXERCISE"  . Solar lentigo 03/11/2008   Back - Atypical  . Thyroid disease   . Upper airway cough syndrome     Past Surgical History:  Procedure Laterality Date  . ABDOMINAL HYSTERECTOMY     Partial  . ESOPHAGOGASTRODUODENOSCOPY (EGD) WITH ESOPHAGEAL DILATION N/A 11/10/2012   Procedure: ESOPHAGOGASTRODUODENOSCOPY (EGD) WITH ESOPHAGEAL DILATION;  Surgeon: Gatha Mayer, MD;  Location: [facility];  Service: Endoscopy;  Laterality: N/A;  [place] vs. Balloon  . VIDEO BRONCHOSCOPY WITH ENDOBRONCHIAL ULTRASOUND Bilateral 01/15/2015   Procedure: VIDEO BRONCHOSCOPY WITH ENDOBRONCHIAL ULTRASOUND of  LEFT LUNG LOWER LOBE and right lung;  Surgeon: Collene Gobble, MD;  Location: [facility];  Service: Thoracic;  Laterality: Bilateral;    Family History  Problem Relation Age of Onset  . Diabetes Mother   . Stroke Mother   . Coronary artery disease Father   . Coronary artery disease Brother   . Hypothyroidism Maternal Grandmother   . Diabetes Maternal Grandfather   . Heart disease Paternal Grandmother   . Heart disease Paternal Grandfather     Social History   Tobacco Use  . Smoking status: Never Smoker  . Smokeless tobacco: Never Used  Substance Use Topics  . Alcohol use: No    Subjective:  Patient is requesting to have her thyroid level re-checked today; has been having increased fatigue recently; in the past, sleep study has been discussed but cost has been an issue;  Was recently seen in the ER with atypical chest pain- they recommended that patient be admitted but she declined; admits that she tried taking  Metformin prior to the onset of the symptoms   Objective:  Vitals:   01/03/19 1048  BP: 130/78  Pulse: 80  Temp: 97.8 F (36.6 C)  TempSrc: Oral  SpO2: 97%  Weight: 205 lb (93 kg)  Height: 5\' 2"  (1.575 m)    General: Well developed, well nourished, in no acute distress  Skin : Warm and dry.  Head: Normocephalic and atraumatic  Lungs: Respirations unlabored; clear to auscultation bilaterally without wheeze, rales, rhonchi  CVS exam: normal rate and regular rhythm.  Neurologic: Alert and oriented; speech intact; face symmetrical; moves all extremities well; CNII-XII intact without focal deficit   Assessment:  1. Hypothyroidism, acquired   2. Type II diabetes mellitus with manifestations ([place])     Plan:  1. Check TSH per patient request;  2. Trial of Farxiga 5 mg daily; patient is to call back with her response.   No follow-ups on file.  Orders Placed This Encounter  Procedures  . TSH    Standing Status:   Future    Number of Occurrences:   1  Standing Expiration Date:   01/03/2020    Requested Prescriptions   Signed Prescriptions Disp Refills  . dapagliflozin propanediol (FARXIGA) 5 MG TABS tablet 30 tablet     Sig: Take 5 mg by mouth daily before breakfast.

## 2019-01-03 NOTE — Patient Instructions (Signed)
Tinea Versicolor  Tinea versicolor is a skin infection. It is caused by a type of yeast. It is normal for some yeast to be on your skin, but too much yeast causes this infection. The infection causes a rash of light or dark patches on your skin. The rash is most common on the chest, back, neck, or upper arms. The infection usually does not cause other problems. If it is treated, it will probably go away in a few weeks. The infection cannot be spread from one person to another (is not contagious). Follow these instructions at home:  Use over-the-counter and prescription medicines only as told by your doctor.  Scrub your skin every day with dandruff shampoo as told by your doctor.  Do not scratch your skin in the rash area.  Avoid places that are hot and humid.  Do not use tanning booths.  Try to avoid sweating a lot. Contact a doctor if:  Your symptoms get worse.  You have a fever.  You have redness, swelling, or pain in the rash area.  You have fluid or blood coming from your rash.  Your rash feels warm to the touch.  You have pus or a bad smell coming from your rash.  Your rash comes back (recurs) after treatment. Summary  Tinea versicolor is a skin infection. It causes a rash of light or dark patches on your skin.  The rash is most common on the chest, back, neck, or upper arms. This infection usually does not cause other problems.  Use over-the-counter and prescription medicines only as told by your doctor.  If the infection is treated, it will probably go away in a few weeks. This information is not intended to replace advice given to you by your health care provider. Make sure you discuss any questions you have with your health care provider. Document Released: 03/18/2008 Document Revised: 03/18/2017 Document Reviewed: 12/06/2016 Elsevier Patient Education  2020 Elsevier Inc.  

## 2019-01-04 ENCOUNTER — Ambulatory Visit: Payer: Self-pay

## 2019-01-30 ENCOUNTER — Other Ambulatory Visit: Payer: Self-pay | Admitting: Family

## 2019-01-30 ENCOUNTER — Telehealth: Payer: Self-pay

## 2019-01-30 DIAGNOSIS — R0609 Other forms of dyspnea: Secondary | ICD-10-CM

## 2019-01-30 NOTE — Telephone Encounter (Signed)
Copied from [place] [id number]. Topic: General - Other >> Jan 30, 2019 12:10 PM Pauline Good wrote: Reason for CRM: pt's cancelled her heart imaging  and she want to get it done now. Pt need the order to be re entered in the system. Please call pt to advise

## 2019-01-30 NOTE — Telephone Encounter (Signed)
According to the system, she has an appointment with cardiologist for 10/29 at 8 am. Please keep that appointment.

## 2019-02-01 ENCOUNTER — Other Ambulatory Visit (INDEPENDENT_AMBULATORY_CARE_PROVIDER_SITE_OTHER): Payer: Self-pay

## 2019-02-01 ENCOUNTER — Other Ambulatory Visit: Payer: Self-pay

## 2019-02-01 ENCOUNTER — Ambulatory Visit: Payer: Self-pay

## 2019-02-01 ENCOUNTER — Ambulatory Visit (INDEPENDENT_AMBULATORY_CARE_PROVIDER_SITE_OTHER)
Admission: RE | Admit: 2019-02-01 | Discharge: 2019-02-01 | Disposition: A | Discharge status: Home / Self Care | Payer: Self-pay | Source: Ambulatory Visit | Attending: Family Medicine | Admitting: Family Medicine

## 2019-02-01 ENCOUNTER — Ambulatory Visit: Payer: Self-pay | Admitting: Family Medicine

## 2019-02-01 ENCOUNTER — Encounter: Payer: Self-pay | Admitting: Family Medicine

## 2019-02-01 ENCOUNTER — Other Ambulatory Visit: Payer: Self-pay | Admitting: Family

## 2019-02-01 VITALS — BP 132/104 | HR 59 | Ht 62.0 in | Wt 207.0 lb

## 2019-02-01 DIAGNOSIS — M545 Low back pain, unspecified: Secondary | ICD-10-CM

## 2019-02-01 DIAGNOSIS — G5701 Lesion of sciatic nerve, right lower limb: Secondary | ICD-10-CM

## 2019-02-01 DIAGNOSIS — M25551 Pain in right hip: Secondary | ICD-10-CM

## 2019-02-01 DIAGNOSIS — M2550 Pain in unspecified joint: Secondary | ICD-10-CM

## 2019-02-01 LAB — SEDIMENTATION RATE: Sed Rate: 26 mm/hr (ref 0–30)

## 2019-02-01 NOTE — Assessment & Plan Note (Signed)
Repeat injection given today.  Discussed posture and ergonomics, we discussed that if this continues to give her trouble we need to consider the possibility of a lumbar radiculopathy.  Discussed the gabapentin and want to take this on a regular basis.  Discussed core strengthening.  Hopefully patient responds well to the injection otherwise advanced imaging of patient's back will be necessary.  X-rays of the back pending.  Follow-up again in 6 to 12 weeks

## 2019-02-01 NOTE — Progress Notes (Signed)
[name] [address] [place], [place] [address] Phone: [phone] Subjective:     CC: Right buttock pain  [id number]   12/15/2018 Patient continues to have pain overall.  Patient will start on gabapentin.  We discussed different things that could help with excellent with the weight loss as well but will be beneficial for her as well as for her diabetes.  At this point I think it is too soon for an injection.  We discussed icing regimen and home exercises.  Follow-up again in 4 to 8 weeks  Update 02/01/2019 Katrina [name] is a 63 y.o. female coming in with complaint of hip pain that is worse than last visit. Pain is moving. Now radiates down her hamstring and into the lower back. Focal point of pain in the piriformis. Patient feels like she is losing mobility. Has been stretching. Was using ice but is now using heat. Feels that her BP medication may be causing her pain. Is using Lisinopril. Came off of it for 4 days. Did not feel any better. Became short of breath so went back on medication.      Past Medical History:  Diagnosis Date   BCC (basal cell carcinoma of skin) Infundibulocystic 03/11/2008   Inner Left Eye   Chest pain at rest 11/07/2012   Diabetes mellitus without complication ([facility])    Eosinophilic esophagitis 0000000   Esophageal stricture    Exercise-induced asthma    GERD (gastroesophageal reflux disease)    Hypertension 09/27/2017   Hypothyroidism    Sarcoidosis    Shortness of breath 11/08/2012   RECENTLY  & WHEN i EXERCISE"   Solar lentigo 03/11/2008   Back - Atypical   Thyroid disease    Upper airway cough syndrome    Past Surgical History:  Procedure Laterality Date   ABDOMINAL HYSTERECTOMY     Partial   ESOPHAGOGASTRODUODENOSCOPY (EGD) WITH ESOPHAGEAL DILATION N/A 11/10/2012   Procedure: ESOPHAGOGASTRODUODENOSCOPY (EGD) WITH ESOPHAGEAL DILATION;  Surgeon: Gatha Mayer, MD;  Location: [facility];   Service: Endoscopy;  Laterality: N/A;  Mal[place] vs. Balloon   VIDEO BRONCHOSCOPY WITH ENDOBRONCHIAL ULTRASOUND Bilateral 01/15/2015   Procedure: VIDEO BRONCHOSCOPY WITH ENDOBRONCHIAL ULTRASOUND of  LEFT LUNG LOWER LOBE and right lung;  Surgeon: Collene Gobble, MD;  Location: [facility];  Service: Thoracic;  Laterality: Bilateral;   Social History   Socioeconomic History   Marital status: Divorced    Spouse name: Not on file   Number of children: 3   Years of education: 14   Highest education level: Not on file  Occupational History   Occupation: Unemployed  Scientist, product/process development strain: Not on file   Food insecurity    Worry: Not on file    Inability: Not on file   Transportation needs    Medical: Not on file    Non-medical: Not on file  Tobacco Use   Smoking status: Never Smoker   Smokeless tobacco: Never Used  Substance and Sexual Activity   Alcohol use: No   Drug use: No   Sexual activity: Not on file  Lifestyle   Physical activity    Days per week: Not on file    Minutes per session: Not on file   Stress: Not on file  Relationships   Social connections    Talks on phone: Not on file    Gets together: Not on file    Attends religious service: Not on file  Active member of club or organization: Not on file    Attends meetings of clubs or organizations: Not on file    Relationship status: Not on file  Other Topics Concern   Not on file  Social History Narrative   Fun: Anything and everything   Allergies  Allergen Reactions   Penicillins Shortness Of Breath and Rash    DID THE REACTION INVOLVE: Swelling of the face/tongue/throat, SOB, or low BP? N Sudden or severe rash/hives, skin peeling, or the inside of the mouth or nose? Yes Did it require medical treatment? No When did it last happen?2010 If all above answers are NO, may proceed with cephalosporin use.    Amlodipine     Hives   Metformin And Related     Elevated heart  rate   Bactrim Ds [Sulfamethoxazole-Trimethoprim] Rash    Upper body and Face redness with heat.     Family History  Problem Relation Age of Onset   Diabetes Mother    Stroke Mother    Coronary artery disease Father    Coronary artery disease Brother    Hypothyroidism Maternal Grandmother    Diabetes Maternal Grandfather    Heart disease Paternal Grandmother    Heart disease Paternal Grandfather     Current Outpatient Medications (Endocrine & Metabolic):    dapagliflozin propanediol (FARXIGA) 5 MG TABS tablet, Take 5 mg by mouth daily before breakfast.   levothyroxine (SYNTHROID) 150 MCG tablet, Take 1 tablet (150 mcg total) by mouth daily before breakfast.  Current Outpatient Medications (Cardiovascular):    lisinopril (ZESTRIL) 10 MG tablet, Take 1 tablet (10 mg total) by mouth daily.   rosuvastatin (CRESTOR) 10 MG tablet, Take 1 tablet (10 mg total) by mouth daily.  Current Outpatient Medications (Respiratory):    albuterol (PROVENTIL HFA;VENTOLIN HFA) 108 (90 Base) MCG/ACT inhaler, Inhale 1-2 puffs into the lungs every 6 (six) hours as needed for wheezing or shortness of breath.   albuterol (PROVENTIL HFA;VENTOLIN HFA) 108 (90 Base) MCG/ACT inhaler, Inhale 2 puffs into the lungs every 4 (four) hours as needed for wheezing or shortness of breath.   diphenhydrAMINE (BENADRYL) 25 MG tablet, Take 1 tablet (25 mg total) by mouth every 8 (eight) hours as needed.   guaiFENesin (MUCINEX) 600 MG 12 hr tablet, Take 1 tablet (600 mg total) by mouth 2 (two) times daily.  Current Outpatient Medications (Analgesics):    aspirin EC 81 MG tablet, Take 1 tablet (81 mg total) by mouth daily.   aspirin-acetaminophen-caffeine (EXCEDRIN MIGRAINE) 250-250-65 MG tablet, Take 1 tablet by mouth every 6 (six) hours as needed for headache.   ibuprofen (ADVIL) 200 MG tablet, Take 200 mg by mouth every 6 (six) hours as needed for headache or mild pain.   Current Outpatient Medications  (Other):    gabapentin (NEURONTIN) 100 MG capsule, Take 2 capsules (200 mg total) by mouth at bedtime.   omeprazole (PRILOSEC) 20 MG capsule, Take 20 mg by mouth daily.   polyvinyl alcohol (LIQUIFILM TEARS) 1.4 % ophthalmic solution, Place 1 drop into both eyes as needed for dry eyes (itching/allergies).   saccharomyces boulardii (FLORASTOR) 250 MG capsule, Take 1 capsule (250 mg total) by mouth 2 (two) times daily. (Patient taking differently: Take 250 mg by mouth daily. )   sucralfate (CARAFATE) 1 g tablet, Take 1 tablet (1 g total) by mouth 4 (four) times daily -  with meals and at bedtime.    Past medical history, social, surgical and family history all reviewed in  electronic medical record.  No pertanent information unless stated regarding to the chief complaint.   Review of Systems:  No headache, visual changes, nausea, vomiting, diarrhea, constipation, dizziness, abdominal pain, skin rash, fevers, chills, night sweats, weight loss, swollen lymph nodes, body aches, joint swelling,  chest pain, shortness of breath, mood changes.  Positive muscle aches  Objective  Blood pressure (!) 132/104, pulse (!) 59, height 5\' 2"  (1.575 m), weight 207 lb (93.9 kg), SpO2 96 %.    General: No apparent distress alert and oriented x3 mood and affect normal, dressed appropriately.  HEENT: Pupils equal, extraocular movements intact  Respiratory: Patient's speak in full sentences and does not appear short of breath  Cardiovascular: No lower extremity edema, non tender, no erythema  Skin: Warm dry intact with no signs of infection or rash on extremities or on axial skeleton.  Abdomen: Soft nontender  Neuro: Cranial nerves II through XII are intact, neurovascularly intact in all extremities with 2+ DTRs and 2+ pulses.  Lymph: No lymphadenopathy of posterior or anterior cervical chain or axillae bilaterally.  Gait mild antalgic MSK:  tender with full range of motion and good stability and symmetric  strength and tone of shoulders, elbows, wrist,  knee and ankles bilaterally.  Right hip exam still shows severe tenderness over the piriformis muscle.  Patient has a positive Faber, negative straight leg test.  Mild pain in the paraspinal musculature of the lumbar spine.  Mild pain with increasing extension of the back.  Neurovascular intact distally.  Procedure: Real-time Ultrasound Guided Injection of right piriformis tendon sheath Device: GE Logiq Q7 Ultrasound guided injection is preferred based studies that show increased duration, increased effect, greater accuracy, decreased procedural pain, increased response rate, and decreased cost with ultrasound guided versus blind injection.  Verbal informed consent obtained.  Time-out conducted.  Noted no overlying erythema, induration, or other signs of local infection.  Skin prepped in a sterile fashion.  Local anesthesia: Topical Ethyl chloride.  With sterile technique and under real time ultrasound guidance: With a 21-gauge 2 inch needle injected into the right piriformis tendon sheath laterally this time with a total of 2 cc of 0.5% Marcaine and 1 cc of Kenalog 40 mg/mL Completed without difficulty  Pain immediately resolved suggesting accurate placement of the medication.  Advised to call if fevers/chills, erythema, induration, drainage, or persistent bleeding.  Images permanently stored and available for review in the ultrasound unit.  Impression: Technically successful ultrasound guided injection.   Impression and Recommendations:     This case required medical decision making of moderate complexity. The above documentation has been reviewed and is accurate and complete Lyndal Pulley, DO       Note: This dictation was prepared with Dragon dictation along with smaller phrase technology. Any transcriptional errors that result from this process are unintentional.

## 2019-02-01 NOTE — Telephone Encounter (Signed)
Pt called about refill request forlisinopril (ZESTRIL) 10 MG tablet  and stated she contacted pharmacy over a week ago and now she is completely out / please advise is this can be sent to pharmacy today

## 2019-02-01 NOTE — Patient Instructions (Addendum)
Xray downstairs Injected lateral hip today Lab downstairs See me in 8 weeks

## 2019-02-02 ENCOUNTER — Other Ambulatory Visit: Payer: Self-pay | Admitting: Family

## 2019-02-02 MED ORDER — LISINOPRIL 10 MG PO TABS: 10.0000 mg | ORAL_TABLET | Freq: Every day | ORAL | 1 refills | Status: DC

## 2019-02-03 LAB — ANA: Anti Nuclear Antibody (ANA): NEGATIVE

## 2019-02-08 ENCOUNTER — Ambulatory Visit (INDEPENDENT_AMBULATORY_CARE_PROVIDER_SITE_OTHER): Payer: Self-pay | Admitting: Cardiology

## 2019-02-08 ENCOUNTER — Encounter: Payer: Self-pay | Admitting: Cardiology

## 2019-02-08 ENCOUNTER — Other Ambulatory Visit: Payer: Self-pay

## 2019-02-08 VITALS — BP 149/91 | HR 68 | Temp 95.6°F | Ht 62.5 in | Wt 203.0 lb

## 2019-02-08 DIAGNOSIS — G4733 Obstructive sleep apnea (adult) (pediatric): Secondary | ICD-10-CM

## 2019-02-08 DIAGNOSIS — I10 Essential (primary) hypertension: Secondary | ICD-10-CM

## 2019-02-08 DIAGNOSIS — E119 Type 2 diabetes mellitus without complications: Secondary | ICD-10-CM

## 2019-02-08 DIAGNOSIS — R0602 Shortness of breath: Secondary | ICD-10-CM

## 2019-02-08 DIAGNOSIS — R0789 Other chest pain: Secondary | ICD-10-CM

## 2019-02-08 DIAGNOSIS — Z6836 Body mass index (BMI) 36.0-36.9, adult: Secondary | ICD-10-CM

## 2019-02-08 MED ORDER — SPIRONOLACTONE 25 MG PO TABS: 25.0000 mg | ORAL_TABLET | Freq: Every day | ORAL | 3 refills | Status: DC

## 2019-02-08 NOTE — Progress Notes (Signed)
Primary Physician/Referring:  Marrian Salvage, FNP  Patient ID: Katrina [name], female    DOB: 11-25-1955, 63 y.o.   MRN: [id number]  Chief Complaint  Patient presents with   New Patient (Initial Visit)   Shortness of Breath   HPI:    Katrina [name]  is a 63 y.o. caucasian female with uncontrolled hypertension, diet-controlled T2DM with A1c of 7, ?pulmonary sarcoidosis (normal mediastinal LN biopsy in 2016), cough variant asthma, hypothyroidism, hyperlipidemia, and obesity presenting to establish care for evaluation of dyspnea on exertion and atypical chest pain.    She endorses a several month history of dyspnea on exertion, mainly with walking up hills or going upstairs.  Feels like she can walk about 20 feet before getting short of breath.  She is very frustrated because she would easily be able to walk 6 miles a year ago.  Endorses being easily fatigued with excessive daytime sleepiness.  Snores at night, has not had a sleep study before. she had similar symptoms previously several years ago which were felt to be secondary to GERD, had improvement after starting PPI.  Of note, she has been much more sedentary since wintertime after developing right hip/leg pain that has been very disabling to her.  Does have pulmonary sarcoidosis as a problem in her chart, however says she was last seen by Dr. Melvyn Novas a few years ago who stated biopsies returned normal.  CT chest in 04/2018 showing mild mediastinal and hilar adenopathy with small pericardial effusion that may have been consistent with sarcoidosis.  Also endorses sporadic chest pains.  Bilateral anterior chest, sharp.  Unable to say how often they occur or qualify them well.  No change with rest.  Did have a recent ED visit in 11/2018 which was concerning for exertional chest pain/angina, attempted admission for further work-up however she declined at that time.  Non-smoker, does not drink alcohol.  Denies any palpitations, orthopnea, lower  extremity swelling.  Occasionally have leg cramping with walking.    Past Medical History:  Diagnosis Date   BCC (basal cell carcinoma of skin) Infundibulocystic 03/11/2008   Inner Left Eye   Chest pain at rest 11/07/2012   Diabetes mellitus without complication (HCC)    Eosinophilic esophagitis 0000000   Esophageal stricture    Exercise-induced asthma    GERD (gastroesophageal reflux disease)    Hypertension 09/27/2017   Hypothyroidism    Sarcoidosis    Shortness of breath 11/08/2012   RECENTLY  & WHEN i EXERCISE"   Solar lentigo 03/11/2008   Back - Atypical   Thyroid disease    Upper airway cough syndrome    Past Surgical History:  Procedure Laterality Date   ABDOMINAL HYSTERECTOMY     Partial   ESOPHAGOGASTRODUODENOSCOPY (EGD) WITH ESOPHAGEAL DILATION N/A 11/10/2012   Procedure: ESOPHAGOGASTRODUODENOSCOPY (EGD) WITH ESOPHAGEAL DILATION;  Surgeon: Gatha Mayer, MD;  Location: [facility];  Service: Endoscopy;  Laterality: N/A;  [place] vs. Balloon   VIDEO BRONCHOSCOPY WITH ENDOBRONCHIAL ULTRASOUND Bilateral 01/15/2015   Procedure: VIDEO BRONCHOSCOPY WITH ENDOBRONCHIAL ULTRASOUND of  LEFT LUNG LOWER LOBE and right lung;  Surgeon: Collene Gobble, MD;  Location: [facility];  Service: Thoracic;  Laterality: Bilateral;   Social History   Socioeconomic History   Marital status: Divorced    Spouse name: Not on file   Number of children: 3   Years of education: 14   Highest education level: Not on file  Occupational History   Occupation: Unemployed  Social Needs  Financial resource strain: Not on file   Food insecurity    Worry: Not on file    Inability: Not on file   Transportation needs    Medical: Not on file    Non-medical: Not on file  Tobacco Use   Smoking status: Never Smoker   Smokeless tobacco: Never Used  Substance and Sexual Activity   Alcohol use: No   Drug use: No   Sexual activity: Not on file  Lifestyle   Physical  activity    Days per week: Not on file    Minutes per session: Not on file   Stress: Not on file  Relationships   Social connections    Talks on phone: Not on file    Gets together: Not on file    Attends religious service: Not on file    Active member of club or organization: Not on file    Attends meetings of clubs or organizations: Not on file    Relationship status: Not on file   Intimate partner violence    Fear of current or ex partner: Not on file    Emotionally abused: Not on file    Physically abused: Not on file    Forced sexual activity: Not on file  Other Topics Concern   Not on file  Social History Narrative   Fun: Anything and everything   ROS  Review of Systems  Constitution: Positive for malaise/fatigue. Negative for fever.  HENT: Negative for hoarse voice.   Cardiovascular: Positive for chest pain and dyspnea on exertion. Negative for claudication, irregular heartbeat, leg swelling, near-syncope, palpitations, paroxysmal nocturnal dyspnea and syncope.  Respiratory: Positive for shortness of breath and snoring. Negative for sputum production and wheezing.   Musculoskeletal: Positive for joint pain.  Gastrointestinal: Positive for heartburn. Negative for bloating, abdominal pain and change in bowel habit.  Genitourinary: Negative for dysuria.  Neurological: Positive for excessive daytime sleepiness. Negative for dizziness and focal weakness.  Psychiatric/Behavioral: Negative for altered mental status.   Objective   Vitals with BMI 02/08/2019 02/01/2019 01/03/2019  Height 5' 2.5" 5\' 2"  5\' 2"   Weight 203 lbs 207 lbs 205 lbs  BMI 36.51 Q000111Q [id number]  Systolic 123456 [id number] [id number]  Diastolic 91 123456 78  Pulse 68 59 80    Blood pressure (!) 149/91, pulse 68, temperature (!) 95.6 F (35.3 C), height 5' 2.5" (1.588 m), weight 203 lb (92.1 kg), SpO2 96 %. Body mass index is 36.54 kg/m.   *Physical Exam  Constitutional: She is oriented to person, place, and time. She  appears well-developed and well-nourished. No distress.  HENT:  Mouth/Throat: Oropharynx is clear and moist.  Eyes: Conjunctivae and EOM are normal.  Neck: Neck supple. No JVD present.  Cardiovascular: Normal rate, regular rhythm and normal heart sounds.  No murmur heard. Pulses:      Carotid pulses are 2+ on the right side and 2+ on the left side.      Radial pulses are 2+ on the right side and 2+ on the left side.       Femoral pulses are 2+ on the right side and 2+ on the left side.      Popliteal pulses are 2+ on the right side and 2+ on the left side.       Dorsalis pedis pulses are 2+ on the right side and 2+ on the left side.       Posterior tibial pulses are 2+ on the right side and 2+ on  the left side.  Pulmonary/Chest: Effort normal and breath sounds normal.  Abdominal: Soft.  Neurological: She is alert and oriented to person, place, and time.  Skin: Skin is warm and dry.  Psychiatric: She has a normal mood and affect.    Laboratory examination:   Recent Labs    05/17/18 1315  08/14/18 1408 08/22/18 1217 12/06/18 1725  NA 139   < > 138 137 137  K 4.1   < > 3.9 4.3 3.7  CL 102   < > 103 102 102  CO2 29   < > 23 27 26   GLUCOSE 187*   < > 159* 108* 152*  BUN 14   < > 11 13 13   CREATININE 0.77   < > 0.66 0.70 0.80  CALCIUM 9.1   < > 9.3 9.9 8.8*  GFRNONAA >60  --  >60  --  >60  GFRAA >60  --  >60  --  >60   < > = values in this interval not displayed.   CMP Latest Ref Rng & Units 12/06/2018 08/22/2018 08/14/2018  Glucose 70 - 99 mg/dL 152(H) 108(H) 159(H)  BUN 8 - 23 mg/dL 13 13 11   Creatinine 0.44 - 1.00 mg/dL 0.80 0.70 0.66  Sodium 135 - 145 mmol/L 137 137 138  Potassium 3.5 - 5.1 mmol/L 3.7 4.3 3.9  Chloride 98 - 111 mmol/L 102 102 103  CO2 22 - 32 mmol/L 26 27 23   Calcium 8.9 - 10.3 mg/dL 8.8(L) 9.9 9.3  Total Protein 6.5 - 8.1 g/dL 7.1 - -  Total Bilirubin 0.3 - 1.2 mg/dL 0.6 - -  Alkaline Phos 38 - 126 U/L 79 - -  AST 15 - 41 U/L 22 - -  ALT 0 - 44 U/L 27  - -   CBC Latest Ref Rng & Units 12/06/2018 08/14/2018 05/30/2018  WBC 4.0 - 10.5 K/uL 9.6 8.6 9.7  Hemoglobin 12.0 - 15.0 g/dL 13.7 14.7 14.7  Hematocrit 36.0 - 46.0 % 41.6 44.7 43.5  Platelets 150 - 400 K/uL 266 279 277.0   Lipid Panel     Component Value Date/Time   CHOL 141 08/22/2018 1217   TRIG 268.0 (H) 08/22/2018 1217   HDL 27.00 (L) 08/22/2018 1217   CHOLHDL 5 08/22/2018 1217   VLDL 53.6 (H) 08/22/2018 1217   LDLDIRECT 83.0 08/22/2018 1217   HEMOGLOBIN A1C Lab Results  Component Value Date   HGBA1C 7.0 (H) 11/29/2018   TSH Recent Labs    08/16/18 0954 09/19/18 1103 01/03/19 1120  TSH 4.30 0.45 2.58   Medications and allergies   Allergies  Allergen Reactions   Penicillins Shortness Of Breath and Rash    DID THE REACTION INVOLVE: Swelling of the face/tongue/throat, SOB, or low BP? N Sudden or severe rash/hives, skin peeling, or the inside of the mouth or nose? Yes Did it require medical treatment? No When did it last happen?2010 If all above answers are NO, may proceed with cephalosporin use.    Amlodipine     Hives   Metformin And Related     Elevated heart rate   Bactrim Ds [Sulfamethoxazole-Trimethoprim] Rash    Upper body and Face redness with heat.       Prior to Admission medications   Medication Sig Start Date End Date Taking? Authorizing Provider  albuterol (PROVENTIL HFA;VENTOLIN HFA) 108 (90 Base) MCG/ACT inhaler Inhale 1-2 puffs into the lungs every 6 (six) hours as needed for wheezing or shortness of breath. 04/24/18  Yes Binnie Rail, MD  aspirin EC 81 MG tablet Take 1 tablet (81 mg total) by mouth daily. Patient taking differently: Take 81 mg by mouth as needed.  08/22/18  Yes Janith Lima, MD  aspirin-acetaminophen-caffeine (EXCEDRIN MIGRAINE) [phone] MG tablet Take 1 tablet by mouth every 6 (six) hours as needed for headache.   Yes [provider]  diphenhydrAMINE (BENADRYL) 25 MG tablet Take 1 tablet (25 mg total) by  mouth every 8 (eight) hours as needed. 09/24/18  Yes Horton, Barbette Hair, MD  ibuprofen (ADVIL) 200 MG tablet Take 200 mg by mouth every 6 (six) hours as needed for headache or mild pain.   Yes [provider]  levothyroxine (SYNTHROID) 150 MCG tablet Take 1 tablet (150 mcg total) by mouth daily before breakfast. 11/29/18  Yes Marrian Salvage, FNP  lisinopril (ZESTRIL) 10 MG tablet Take 1 tablet (10 mg total) by mouth daily. 02/02/19  Yes Marrian Salvage, FNP  omeprazole (PRILOSEC) 20 MG capsule Take 20 mg by mouth daily.   Yes [provider]  polyvinyl alcohol (LIQUIFILM TEARS) 1.4 % ophthalmic solution Place 1 drop into both eyes as needed for dry eyes (itching/allergies).   Yes [provider]  saccharomyces boulardii (FLORASTOR) 250 MG capsule Take 1 capsule (250 mg total) by mouth 2 (two) times daily. Patient taking differently: Take 250 mg by mouth daily.  05/02/18  Yes Florencia Reasons, MD     Current Outpatient Medications  Medication Instructions   albuterol (PROVENTIL HFA;VENTOLIN HFA) 108 (90 Base) MCG/ACT inhaler 1-2 puffs, Inhalation, Every 6 hours PRN   aspirin EC 81 mg, Oral, Daily   aspirin-acetaminophen-caffeine (EXCEDRIN MIGRAINE) 250-250-65 MG tablet 1 tablet, Oral, Every 6 hours PRN   diphenhydrAMINE (BENADRYL) 25 mg, Oral, Every 8 hours PRN   ibuprofen (ADVIL) 200 mg, Oral, Every 6 hours PRN   levothyroxine (SYNTHROID) 150 mcg, Oral, Daily before breakfast   lisinopril (ZESTRIL) 10 mg, Oral, Daily   omeprazole (PRILOSEC) 20 mg, Oral, Daily   polyvinyl alcohol (LIQUIFILM TEARS) 1.4 % ophthalmic solution 1 drop, Both Eyes, As needed   saccharomyces boulardii (FLORASTOR) 250 mg, Oral, 2 times daily   spironolactone (ALDACTONE) 25 mg, Oral, Daily    Radiology:  No results found. CT Chest 04/2018 IMPRESSION: 1. Negative for acute pulmonary embolus 2. Multiple bilateral subpleural foci of nodular airspace disease which could be  infectious or inflammatory in etiology. Bandlike density in the right lower lobe, contiguous with possible 17 x 15 mm right lower lobe lung nodule versus focus of pneumonia. Short interval CT follow-up recommended following antibiotic trial to ensure clearing. 3. Mild mediastinal and hilar adenopathy. This does not appear significantly changed and may relate to history of sarcoidosis in the patient's epic chart.  Cardiac Studies:    Assessment     ICD-10-CM   1. Shortness of breath  R06.02 EKG 12-Lead    PCV ECHOCARDIOGRAM COMPLETE    spironolactone (ALDACTONE) 25 MG tablet    PCV MYOCARDIAL PERFUSION WO LEXISCAN  2. Atypical chest pain  R07.89 PCV ECHOCARDIOGRAM COMPLETE    PCV MYOCARDIAL PERFUSION WO LEXISCAN  3. Controlled type 2 diabetes mellitus without complication, without long-term current use of insulin (HCC)  E11.9   4. Obstructive sleep apnea  G47.33 Ambulatory referral to Sleep Studies  5. Essential hypertension  99991111 Basic metabolic panel  6. Class 2 severe obesity due to excess calories with serious comorbidity and body mass index (BMI) of 36.0 to 36.9 in adult (  [place])  E66.01    Z68.36     EKG 02/08/2019: Normal sinus rhythm at rate of 75 bpm, borderline left atrial abnormality, normal axis, no evidence of ischemia.  Recommendations:  63 year old Caucasian female with hypertension, obesity, diet-controlled diabetes, GERD,? Pulmonary sarcoidosis, and hypothyroidism presenting for further evaluation of dyspnea on exertion and atypical chest pain.  Reassuringly unremarkable cardiovascular exam in the office today.  Suspect her symptoms may be multifactorial in nature, however largely appear to be secondary to deconditioning after prolonged sedentary lifestyle with chronic right hip/leg pain which is not vascular.    Do not feel that her dyspnea on exertion is related to pulmonary sarcoidosis, however believe she should follow-up with pulmonology on a regular basis for  further evaluation.  Given her multiple cardiovascular risk factors, will assess cardiovascular capacity with nuclear stress test and echocardiogram.  In efforts of primary prevention, will add on spironolactone for hypertensive control (elevated in the office today) and extensively discussed dietary changes in efforts of weight loss.    She would likely benefit by restarting a statin, however will introduce new medications slowly given her tendency for side effects. Also her lipid profile reveals essentially low HDL and elevated triglycerides and this is certainly related to diabetes, heart diet and lifestyle.  All of this can improve with diet modification and weight loss hence could wait on restarting a statin.  Additionally suspect she has underlying OSA with excessive daytime sleepiness and fatigue.  Will send for sleep study.   Will have her come back in approximately 4 weeks after completed studies.  She will get labs drawn in 7-10 days to assess creatinine/potassium after starting Aldactone in addition to her lisinopril.  Recommend continued close follow-up with her PCP for diabetes and thyroid control.  Patriciaann Clan, DO  Family Medicine PGY-2   Patient seen and examined and agree with above assessment.   Adrian Prows, MD, [facility] 02/09/2019, [facility]. [place] Pager: [phone] Office: [phone] If no answer Cell [phone]

## 2019-02-12 NOTE — Progress Notes (Deleted)
Cardiology Office Note:   Date:  02/12/2019  NAME:  Katrina [name]    MRN: [id number] DOB:  1956/04/16   PCP:  Marrian Salvage, [place]  Cardiologist:  Pixie Casino, MD  Electrophysiologist:  None   Referring MD: Marrian Salvage,*   No chief complaint on file. ***  History of Present Illness:   Katrina [name] is a 63 y.o. female with a hx of hypertension, diabetes, asthma, hypothyroidism who is being seen today for the evaluation of chest pain at the request of Marrian Salvage,*.  Past Medical History: Past Medical History:  Diagnosis Date  . BCC (basal cell carcinoma of skin) Infundibulocystic 03/11/2008   Inner Left Eye  . Chest pain at rest 11/07/2012  . Diabetes mellitus without complication ([facility])   . Eosinophilic esophagitis 0000000  . Esophageal stricture   . Exercise-induced asthma   . GERD (gastroesophageal reflux disease)   . Hypertension 09/27/2017  . Hypothyroidism   . Sarcoidosis   . Shortness of breath 11/08/2012   RECENTLY  & WHEN i EXERCISE"  . Solar lentigo 03/11/2008   Back - Atypical  . Thyroid disease   . Upper airway cough syndrome     Past Surgical History: Past Surgical History:  Procedure Laterality Date  . ABDOMINAL HYSTERECTOMY     Partial  . ESOPHAGOGASTRODUODENOSCOPY (EGD) WITH ESOPHAGEAL DILATION N/A 11/10/2012   Procedure: ESOPHAGOGASTRODUODENOSCOPY (EGD) WITH ESOPHAGEAL DILATION;  Surgeon: Gatha Mayer, MD;  Location: [facility];  Service: Endoscopy;  Laterality: N/A;  [place] vs. Balloon  . VIDEO BRONCHOSCOPY WITH ENDOBRONCHIAL ULTRASOUND Bilateral 01/15/2015   Procedure: VIDEO BRONCHOSCOPY WITH ENDOBRONCHIAL ULTRASOUND of  LEFT LUNG LOWER LOBE and right lung;  Surgeon: Collene Gobble, MD;  Location: [facility];  Service: Thoracic;  Laterality: Bilateral;    Current Medications: No outpatient medications have been marked as taking for the 02/15/19 encounter (Appointment) with Geralynn Rile, MD.      Allergies:    Penicillins, Amlodipine, Metformin and related, and Bactrim ds [sulfamethoxazole-trimethoprim]   Social History: Social History   Socioeconomic History  . Marital status: Divorced    Spouse name: Not on file  . Number of children: 3  . Years of education: 79  . Highest education level: Not on file  Occupational History  . Occupation: Unemployed  Social Needs  . Financial resource strain: Not on file  . Food insecurity    Worry: Not on file    Inability: Not on file  . Transportation needs    Medical: Not on file    Non-medical: Not on file  Tobacco Use  . Smoking status: Never Smoker  . Smokeless tobacco: Never Used  Substance and Sexual Activity  . Alcohol use: No  . Drug use: No  . Sexual activity: Not on file  Lifestyle  . Physical activity    Days per week: Not on file    Minutes per session: Not on file  . Stress: Not on file  Relationships  . Social Herbalist on phone: Not on file    Gets together: Not on file    Attends religious service: Not on file    Active member of club or organization: Not on file    Attends meetings of clubs or organizations: Not on file    Relationship status: Not on file  Other Topics Concern  . Not on file  Social History Narrative   Fun: Anything and everything  Family History: The patient's ***family history includes Coronary artery disease in her brother and father; Diabetes in her maternal grandfather and mother; Heart disease in her paternal grandfather and paternal grandmother; Hypothyroidism in her maternal grandmother; Stroke in her mother.  ROS:   All other ROS reviewed and negative. Pertinent positives noted in the HPI.     EKGs/Labs/Other Studies Reviewed:   The following studies were personally reviewed by me today:  EKG:  EKG is *** ordered today.  The ekg ordered today demonstrates ***, and was personally reviewed by me.   Recent Labs: 08/14/2018: B Natriuretic Peptide 29.2  12/06/2018: ALT 27; BUN 13; Creatinine, Ser 0.80; Hemoglobin 13.7; Platelets 266; Potassium 3.7; Sodium 137 01/03/2019: TSH 2.58   Recent Lipid Panel    Component Value Date/Time   CHOL 141 08/22/2018 1217   TRIG 268.0 (H) 08/22/2018 1217   HDL 27.00 (L) 08/22/2018 1217   CHOLHDL 5 08/22/2018 1217   VLDL 53.6 (H) 08/22/2018 1217   LDLDIRECT 83.0 08/22/2018 1217    Physical Exam:   VS:  There were no vitals taken for this visit.   Wt Readings from Last 3 Encounters:  02/08/19 203 lb (92.1 kg)  02/01/19 207 lb (93.9 kg)  01/03/19 205 lb (93 kg)    General: Well nourished, well developed, in no acute distress Heart: Atraumatic, normal size  Eyes: PEERLA, EOMI  Neck: Supple, no JVD Endocrine: No thryomegaly Cardiac: Normal S1, S2; RRR; no murmurs, rubs, or gallops Lungs: Clear to auscultation bilaterally, no wheezing, rhonchi or rales  Abd: Soft, nontender, no hepatomegaly  Ext: No edema, pulses 2+ Musculoskeletal: No deformities, BUE and BLE strength normal and equal Skin: Warm and dry, no rashes   Neuro: Alert and oriented to person, place, time, and situation, CNII-XII grossly intact, no focal deficits  Psych: Normal mood and affect   ASSESSMENT:   Katrina [name] is a 63 y.o. female who presents for the following: No diagnosis found.  PLAN:   There are no diagnoses linked to this encounter.  Disposition: No follow-ups on file.  Medication Adjustments/Labs and Tests Ordered: Current medicines are reviewed at length with the patient today.  Concerns regarding medicines are outlined above.  No orders of the defined types were placed in this encounter.  No orders of the defined types were placed in this encounter.   There are no Patient Instructions on file for this visit.   Signed, Addison Naegeli. Audie Box, [place]  [address] [place], [place] [address] [phone]  02/12/2019 12:29 PM

## 2019-02-15 ENCOUNTER — Ambulatory Visit: Payer: Self-pay | Admitting: Cardiovascular Disease

## 2019-02-16 ENCOUNTER — Other Ambulatory Visit: Payer: Self-pay

## 2019-02-16 ENCOUNTER — Ambulatory Visit (INDEPENDENT_AMBULATORY_CARE_PROVIDER_SITE_OTHER): Payer: Self-pay

## 2019-02-16 DIAGNOSIS — R0602 Shortness of breath: Secondary | ICD-10-CM

## 2019-02-16 DIAGNOSIS — R0789 Other chest pain: Secondary | ICD-10-CM

## 2019-02-19 ENCOUNTER — Ambulatory Visit: Payer: Self-pay | Admitting: Family Medicine

## 2019-02-20 ENCOUNTER — Other Ambulatory Visit: Payer: Self-pay

## 2019-02-20 ENCOUNTER — Ambulatory Visit
Admission: RE | Admit: 2019-02-20 | Discharge: 2019-02-20 | Disposition: A | Discharge status: Home / Self Care | Payer: Self-pay | Source: Ambulatory Visit | Attending: Family | Admitting: Family

## 2019-02-20 DIAGNOSIS — Z1231 Encounter for screening mammogram for malignant neoplasm of breast: Secondary | ICD-10-CM

## 2019-03-12 ENCOUNTER — Other Ambulatory Visit: Payer: Self-pay

## 2019-03-19 ENCOUNTER — Ambulatory Visit: Payer: Self-pay | Admitting: Cardiology

## 2019-03-27 ENCOUNTER — Ambulatory Visit: Payer: Self-pay | Admitting: Family Medicine

## 2019-04-02 ENCOUNTER — Encounter: Payer: Self-pay | Admitting: Family Medicine

## 2019-04-02 ENCOUNTER — Ambulatory Visit (INDEPENDENT_AMBULATORY_CARE_PROVIDER_SITE_OTHER): Payer: Medicare Other | Admitting: Family Medicine

## 2019-04-02 ENCOUNTER — Ambulatory Visit: Payer: Self-pay

## 2019-04-02 ENCOUNTER — Other Ambulatory Visit: Payer: Self-pay

## 2019-04-02 VITALS — BP 142/84 | HR 66 | Ht 62.5 in

## 2019-04-02 DIAGNOSIS — M25551 Pain in right hip: Secondary | ICD-10-CM

## 2019-04-02 DIAGNOSIS — G5701 Lesion of sciatic nerve, right lower limb: Secondary | ICD-10-CM

## 2019-04-02 NOTE — Patient Instructions (Addendum)
  [address] [place], [place] [address] Phone [phone]  Hope injection helps See me again in 4-6 weeks

## 2019-04-02 NOTE — Progress Notes (Signed)
[facility] [address], [place] [address] Phone: [phone] Subjective:   I Katrina [name] am serving as a Education administrator for Dr. Hulan Saas.  This visit occurred during the SARS-CoV-2 public health emergency.  Safety protocols were in place, including screening questions prior to the visit, additional usage of staff PPE, and extensive cleaning of exam room while observing appropriate contact time as indicated for disinfecting solutions.   CC: Buttocks pain  [id number]   02/01/2019 Repeat injection given today.  Discussed posture and ergonomics, we discussed that if this continues to give her trouble we need to consider the possibility of a lumbar radiculopathy.  Discussed the gabapentin and want to take this on a regular basis.  Discussed core strengthening.  Hopefully patient responds well to the injection otherwise advanced imaging of patient's back will be necessary.  X-rays of the back pending.  Follow-up again in 6 to 12 weeks  04/02/2019 Katrina [name] is a 63 y.o. female coming in with complaint of right hip pain. Patient states she has had no pain until this weekend. Patient walked for 2 days and experienced pain after she sat down to rest. Patient wants to continue to stay active. No pain while walking.  Patient states it is slowly starting to increase activity again but the locking was significantly heavy     Past Medical History:  Diagnosis Date  . BCC (basal cell carcinoma of skin) Infundibulocystic 03/11/2008   Inner Left Eye  . Chest pain at rest 11/07/2012  . Diabetes mellitus without complication ([place])   . Eosinophilic esophagitis 0000000  . Esophageal stricture   . Exercise-induced asthma   . GERD (gastroesophageal reflux disease)   . Hypertension 09/27/2017  . Hypothyroidism   . Sarcoidosis   . Shortness of breath 11/08/2012   RECENTLY  & WHEN i EXERCISE"  . Solar lentigo 03/11/2008   Back - Atypical  . Thyroid disease     . Upper airway cough syndrome    Past Surgical History:  Procedure Laterality Date  . ABDOMINAL HYSTERECTOMY     Partial  . ESOPHAGOGASTRODUODENOSCOPY (EGD) WITH ESOPHAGEAL DILATION N/A 11/10/2012   Procedure: ESOPHAGOGASTRODUODENOSCOPY (EGD) WITH ESOPHAGEAL DILATION;  Surgeon: Gatha Mayer, MD;  Location: [facility];  Service: Endoscopy;  Laterality: N/A;  [place] vs. Balloon  . VIDEO BRONCHOSCOPY WITH ENDOBRONCHIAL ULTRASOUND Bilateral 01/15/2015   Procedure: VIDEO BRONCHOSCOPY WITH ENDOBRONCHIAL ULTRASOUND of  LEFT LUNG LOWER LOBE and right lung;  Surgeon: Collene Gobble, MD;  [facility];  Service: Thoracic;  Laterality: Bilateral;   Social History   Socioeconomic History  . Marital status: Divorced    Spouse name: Not on file  . Number of children: 3  . Years of education: 101  . Highest education level: Not on file  Occupational History  . Occupation: Unemployed  Tobacco Use  . Smoking status: Never Smoker  . Smokeless tobacco: Never Used  Substance and Sexual Activity  . Alcohol use: No  . Drug use: No  . Sexual activity: Not on file  Other Topics Concern  . Not on file  Social History Narrative   Fun: Anything and everything   Social Determinants of Health   Financial Resource Strain:   . Difficulty of Paying Living Expenses: Not on file  Food Insecurity:   . Worried About Charity fundraiser in the Last Year: Not on file  . Ran Out of Food in the Last Year: Not on file  Transportation Needs:   . Film/video editor (Medical): Not on file  . Lack of Transportation (Non-Medical): Not on file  Physical Activity:   . Days of Exercise per Week: Not on file  . Minutes of Exercise per Session: Not on file  Stress:   . Feeling of Stress : Not on file  Social Connections:   . Frequency of Communication with Friends and Family: Not on file  . Frequency of Social Gatherings with Friends and Family: Not on file  . Attends Religious Services: Not on file  .  Active Member of Clubs or Organizations: Not on file  . Attends Archivist Meetings: Not on file  . Marital Status: Not on file   Allergies  Allergen Reactions  . Penicillins Shortness Of Breath and Rash    DID THE REACTION INVOLVE: Swelling of the face/tongue/throat, SOB, or low BP? N Sudden or severe rash/hives, skin peeling, or the inside of the mouth or nose? Yes Did it require medical treatment? No When did it last happen?2010 If all above answers are "NO", may proceed with cephalosporin use.   . Amlodipine     Hives  . Metformin And Related     Elevated heart rate  . Bactrim Ds [Sulfamethoxazole-Trimethoprim] Rash    Upper body and Face redness with heat.     Family History  Problem Relation Age of Onset  . Diabetes Mother   . Stroke Mother   . Coronary artery disease Father   . Coronary artery disease Brother   . Hypothyroidism Maternal Grandmother   . Diabetes Maternal Grandfather   . Heart disease Paternal Grandmother   . Heart disease Paternal Grandfather     Current Outpatient Medications (Endocrine & Metabolic):  .  levothyroxine (SYNTHROID) 150 MCG tablet, Take 1 tablet (150 mcg total) by mouth daily before breakfast.  Current Outpatient Medications (Cardiovascular):  .  lisinopril (ZESTRIL) 10 MG tablet, Take 1 tablet (10 mg total) by mouth daily. Marland Kitchen  spironolactone (ALDACTONE) 25 MG tablet, Take 1 tablet (25 mg total) by mouth daily.  Current Outpatient Medications (Respiratory):  .  albuterol (PROVENTIL HFA;VENTOLIN HFA) 108 (90 Base) MCG/ACT inhaler, Inhale 1-2 puffs into the lungs every 6 (six) hours as needed for wheezing or shortness of breath. .  diphenhydrAMINE (BENADRYL) 25 MG tablet, Take 1 tablet (25 mg total) by mouth every 8 (eight) hours as needed.  Current Outpatient Medications (Analgesics):  .  aspirin EC 81 MG tablet, Take 1 tablet (81 mg total) by mouth daily. (Patient taking differently: Take 81 mg by mouth as needed. ) .   aspirin-acetaminophen-caffeine (EXCEDRIN MIGRAINE) 250-250-65 MG tablet, Take 1 tablet by mouth every 6 (six) hours as needed for headache. .  ibuprofen (ADVIL) 200 MG tablet, Take 200 mg by mouth every 6 (six) hours as needed for headache or mild pain.   Current Outpatient Medications (Other):  .  omeprazole (PRILOSEC) 20 MG capsule, Take 20 mg by mouth daily. .  polyvinyl alcohol (LIQUIFILM TEARS) 1.4 % ophthalmic solution, Place 1 drop into both eyes as needed for dry eyes (itching/allergies). .  saccharomyces boulardii (FLORASTOR) 250 MG capsule, Take 1 capsule (250 mg total) by mouth 2 (two) times daily. (Patient taking differently: Take 250 mg by mouth daily. )    Past medical history, social, surgical and family history all reviewed in electronic medical record.  No pertanent information unless stated regarding to the chief complaint.   Review of Systems:  No headache, visual changes, nausea,  vomiting, diarrhea, constipation, dizziness, abdominal pain, skin rash, fevers, chills, night sweats, weight loss, swollen lymph nodes, body aches, joint swelling,  chest pain, shortness of breath, mood changes.  Positive muscle aches  Objective  Blood pressure (!) 142/84, pulse 66, height 5' 2.5" (1.588 m), SpO2 97 %.    General: No apparent distress alert and oriented x3 mood and affect normal, dressed appropriately.  HEENT: Pupils equal, extraocular movements intact  Respiratory: Patient's speak in full sentences and does not appear short of breath  Cardiovascular: No lower extremity edema, non tender, no erythema  Skin: Warm dry intact with no signs of infection or rash on extremities or on axial skeleton.  Abdomen: Soft nontender  Neuro: Cranial nerves II through XII are intact, neurovascularly intact in all extremities with 2+ DTRs and 2+ pulses.  Lymph: No lymphadenopathy of posterior or anterior cervical chain or axillae bilaterally.  Gait normal with good balance and coordination.    MSK:  Non tender with full range of motion and good stability and symmetric strength and tone of shoulders, elbows, wrist, hip, knee and ankles bilaterally.  Back exam does have some tenderness to palpation in the paraspinal musculature lumbar spine right greater than left.  Severe tenderness of the right piriformis with a positive FABER test.  Mild pain of the gluteal tendon as well.  Hip abductor strength 4 out of 5.  Neurovascular intact distally.  Negative straight leg test.  Procedure: Real-time Ultrasound Guided Injection of  Device: GE Logiq Q7 Ultrasound guided injection is preferred based studies that show increased duration, increased effect, greater accuracy, decreased procedural pain, increased response rate, and decreased cost with ultrasound guided versus blind injection.  Verbal informed consent obtained.  Time-out conducted.  Noted no overlying erythema, induration, or other signs of local infection.  Skin prepped in a sterile fashion.  Local anesthesia: Topical Ethyl chloride.  With sterile technique and under real time ultrasound guidance: With a 21-gauge 2 inch needle injected with 0.5 cc of 0.5% Marcaine and 1 cc of Kenalog 40 mg/mL Completed without difficulty  Pain immediately resolved suggesting accurate placement of the medication.  Advised to call if fevers/chills, erythema, induration, drainage, or persistent bleeding.  Images permanently stored and available for review in the ultrasound unit.  Impression: Technically successful ultrasound guided injection.   Impression and Recommendations:     This case required medical decision making of moderate complexity. The above documentation has been reviewed and is accurate and complete Lyndal Pulley, DO       Note: This dictation was prepared with Dragon dictation along with smaller phrase technology. Any transcriptional errors that result from this process are unintentional.

## 2019-04-04 ENCOUNTER — Telehealth: Payer: Self-pay | Admitting: *Deleted

## 2019-04-04 ENCOUNTER — Other Ambulatory Visit: Payer: Self-pay | Admitting: Family

## 2019-04-04 MED ORDER — LEVOTHYROXINE SODIUM 150 MCG PO TABS: 150.0000 ug | ORAL_TABLET | Freq: Every day | ORAL | 0 refills | Status: DC

## 2019-04-04 NOTE — Telephone Encounter (Signed)
Spoke to pt, she stated she saw Dr. Tamala Julian this week & was given a shot in her hip. She stated that the shot did not really help & she is having more pain on the outside of her hip. She would like a call back to discuss any recommendations that Dr. Tamala Julian may have.

## 2019-04-04 NOTE — Telephone Encounter (Signed)
Copied from [place] [phone]. Topic: Quick Communication - Rx Refill/Question >> Apr 04, 2019 10:33 AM Yvette Rack wrote: Medication: levothyroxine (SYNTHROID) 150 MCG tablet  Has the patient contacted their pharmacy? no  Preferred Pharmacy (with phone number or street name): [id number] - [place], [address] Phone: [phone]  Fax: [phone]   Agent: Please be advised that RX refills may take up to 3 business days. We ask that you follow-up with your pharmacy.

## 2019-04-30 ENCOUNTER — Ambulatory Visit: Payer: Medicare Other | Admitting: Family Medicine

## 2019-05-10 ENCOUNTER — Ambulatory Visit: Payer: Medicare Other | Attending: Internal Medicine

## 2019-05-10 DIAGNOSIS — Z23 Encounter for immunization: Secondary | ICD-10-CM | POA: Insufficient documentation

## 2019-05-10 NOTE — Progress Notes (Signed)
   Covid-19 Vaccination Clinic  Name:  Katrina [name]    MRN: [id number] DOB: January 18, 1956  05/10/2019  [name] was observed post Covid-19 immunization for 15 minutes without incidence. She was provided with Vaccine Information Sheet and instruction to access the V-Safe system.   [name] was instructed to call 911 with any severe reactions post vaccine: Marland Kitchen Difficulty breathing  . Swelling of your face and throat  . A fast heartbeat  . A bad rash all over your body  . Dizziness and weakness    Immunizations Administered    Name Date Dose VIS Date Route   Pfizer COVID-19 Vaccine 05/10/2019  5:13 PM 0.3 mL 03/30/2019 Intramuscular   Manufacturer: [place]   Lot: [id number]   [place]: [id number]

## 2019-05-31 ENCOUNTER — Ambulatory Visit: Payer: Medicare Other | Attending: Internal Medicine

## 2019-05-31 DIAGNOSIS — Z23 Encounter for immunization: Secondary | ICD-10-CM

## 2019-05-31 NOTE — Progress Notes (Signed)
   Covid-19 Vaccination Clinic  Name:  Katrina [name]    MRN: [id number] DOB: 05/11/1955  05/31/2019  Katrina [name] was observed post Covid-19 immunization for 15 minutes without incidence. She was provided with Vaccine Information Sheet and instruction to access the V-Safe system.   Katrina [name] was instructed to call 911 with any severe reactions post vaccine: Marland Kitchen Difficulty breathing  . Swelling of your face and throat  . A fast heartbeat  . A bad rash all over your body  . Dizziness and weakness    Immunizations Administered    Name Date Dose VIS Date Route   Pfizer COVID-19 Vaccine 05/31/2019  8:23 AM 0.3 mL 03/30/2019 Intramuscular   Manufacturer: [place]   Lot: [id number]   [place]: [id number]

## 2019-06-22 ENCOUNTER — Encounter: Payer: Self-pay | Admitting: Family

## 2019-06-22 ENCOUNTER — Other Ambulatory Visit: Payer: Self-pay

## 2019-06-22 ENCOUNTER — Ambulatory Visit (INDEPENDENT_AMBULATORY_CARE_PROVIDER_SITE_OTHER): Payer: Medicare Other | Admitting: Family

## 2019-06-22 VITALS — BP 130/82 | HR 72 | Temp 98.2°F | Ht 62.5 in | Wt 204.0 lb

## 2019-06-22 DIAGNOSIS — E038 Other specified hypothyroidism: Secondary | ICD-10-CM | POA: Diagnosis not present

## 2019-06-22 DIAGNOSIS — E118 Type 2 diabetes mellitus with unspecified complications: Secondary | ICD-10-CM

## 2019-06-22 DIAGNOSIS — R0600 Dyspnea, unspecified: Secondary | ICD-10-CM | POA: Diagnosis not present

## 2019-06-22 DIAGNOSIS — R0609 Other forms of dyspnea: Secondary | ICD-10-CM

## 2019-06-22 LAB — CBC WITH DIFFERENTIAL/PLATELET
Basophils Absolute: 0.1 10*3/uL (ref 0.0–0.1)
Basophils Relative: 0.6 % (ref 0.0–3.0)
Eosinophils Absolute: 0.1 10*3/uL (ref 0.0–0.7)
Eosinophils Relative: 1.3 % (ref 0.0–5.0)
HCT: 42.3 % (ref 36.0–46.0)
Hemoglobin: 14.1 g/dL (ref 12.0–15.0)
Lymphocytes Relative: 23.3 % (ref 12.0–46.0)
Lymphs Abs: 2.2 10*3/uL (ref 0.7–4.0)
MCHC: 33.3 g/dL (ref 30.0–36.0)
MCV: 84.5 fl (ref 78.0–100.0)
Monocytes Absolute: 0.6 10*3/uL (ref 0.1–1.0)
Monocytes Relative: 6.4 % (ref 3.0–12.0)
Neutro Abs: 6.4 10*3/uL (ref 1.4–7.7)
Neutrophils Relative %: 68.4 % (ref 43.0–77.0)
Platelets: 279 10*3/uL (ref 150.0–400.0)
RBC: 5 Mil/uL (ref 3.87–5.11)
RDW: 14.4 % (ref 11.5–15.5)
WBC: 9.4 10*3/uL (ref 4.0–10.5)

## 2019-06-22 LAB — COMPREHENSIVE METABOLIC PANEL
ALT: 20 U/L (ref 0–35)
AST: 16 U/L (ref 0–37)
Albumin: 4 g/dL (ref 3.5–5.2)
Alkaline Phosphatase: 72 U/L (ref 39–117)
BUN: 13 mg/dL (ref 6–23)
CO2: 28 mEq/L (ref 19–32)
Calcium: 9.6 mg/dL (ref 8.4–10.5)
Chloride: 104 mEq/L (ref 96–112)
Creatinine, Ser: 0.77 mg/dL (ref 0.40–1.20)
GFR: 75.17 mL/min (ref >60.00)
Glucose, Bld: 97 mg/dL (ref 70–99)
Potassium: 3.8 mEq/L (ref 3.5–5.1)
Sodium: 139 mEq/L (ref 135–145)
Total Bilirubin: 0.5 mg/dL (ref 0.2–1.2)
Total Protein: 7.4 g/dL (ref 6.0–8.3)

## 2019-06-22 LAB — TSH: TSH: 0.41 u[IU]/mL (ref 0.35–4.50)

## 2019-06-22 LAB — HEMOGLOBIN A1C: Hgb A1c MFr Bld: 6.6 % — ABNORMAL HIGH (ref 4.6–6.5)

## 2019-06-22 NOTE — Progress Notes (Signed)
Katrina [name] is a 64 y.o. female with the following history as recorded in EpicCare:  Patient Active Problem List   Diagnosis Date Noted  . Piriformis syndrome of right side 10/04/2018  . Abnormal electrocardiogram (ECG) (EKG) 08/22/2018  . Prediabetes 08/22/2018  . Hyperlipidemia with target LDL less than 130 08/22/2018  . Type II diabetes mellitus with manifestations ([place]) 08/22/2018  . Esophageal stricture   . Obesity (BMI 30.0-34.9)   . Lobar pneumonia ([place])   . Hypertension 09/27/2017  . 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 Low back pain associated with a spinal disorder other than radiculopathy or spinal stenosis 03/01/2016  . Myalgia 10/09/2015  . Upper airway cough syndrome 01/26/2015  . Obesity (BMI 30-39.9) 01/26/2015  . Pulmonary nodules   . DOE (dyspnea on exertion) 01/01/2015  . Pulmonary sarcoidosis ([place])   . Mediastinal lymphadenopathy 12/26/2014  . Esophageal ring, acquired 11/10/2012  . Dysphagia, unspecified(787.20) 11/09/2012  . Hypothyroidism 11/08/2012  . 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 Hypothyroidism, acquired 12/28/2007  . GERD 12/28/2007  . 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 albuterol (PROVENTIL HFA;VENTOLIN HFA) 108 (90 Base) MCG/ACT inhaler Inhale 1-2 puffs into the lungs every 6 (six) hours as needed for wheezing or shortness of breath. 1 Inhaler 0  . aspirin EC 81 MG tablet Take 1 tablet (81 mg total) by mouth daily. (Patient taking differently: Take 81 mg by mouth as needed. ) 90 tablet 1  . aspirin-acetaminophen-caffeine (EXCEDRIN MIGRAINE) 250-250-65 MG tablet Take 1 tablet by mouth every 6 (six) hours as needed for headache.    . diphenhydrAMINE (BENADRYL) 25 MG tablet Take 1 tablet (25 mg total) by mouth every 8 (eight) hours as needed. 20 tablet 0  . ibuprofen (ADVIL) 200 MG tablet Take 200 mg by mouth every 6 (six) hours  as needed for headache or mild pain.    Marland Kitchen levothyroxine (SYNTHROID) 150 MCG tablet Take 1 tablet (150 mcg total) by mouth daily before breakfast. 90 tablet 0  . lisinopril (ZESTRIL) 10 MG tablet Take 1 tablet (10 mg total) by mouth daily. 90 tablet 1  . omeprazole (PRILOSEC) 20 MG capsule Take 20 mg by mouth daily.    . polyvinyl alcohol (LIQUIFILM TEARS) 1.4 % ophthalmic solution Place 1 drop into both eyes as needed for dry eyes (itching/allergies).    Marland Kitchen PRED FORTE 1 % ophthalmic suspension 1 drop 2 (two) times daily.    Marland Kitchen saccharomyces boulardii (FLORASTOR) 250 MG capsule Take 1 capsule (250 mg total) by mouth 2 (two) times daily. (Patient taking differently: Take 250 mg by mouth daily. ) 60 capsule 0  . spironolactone (ALDACTONE) 25 MG tablet Take 1 tablet (25 mg total) by mouth daily. 90 tablet 3   No current facility-administered medications for this visit.    Allergies: Penicillins, Amlodipine, Metformin and related, and Bactrim ds [sulfamethoxazole-trimethoprim]  Past Medical History:  Diagnosis Date  . BCC (basal cell carcinoma of skin) Infundibulocystic 03/11/2008   Inner Left Eye  . Chest pain at rest 11/07/2012  . Diabetes mellitus without complication ([place])   . Eosinophilic esophagitis 39/06/90  . Esophageal stricture   . Exercise-induced asthma   . GERD (gastroesophageal reflux disease)   . Hypertension 09/27/2017  . Hypothyroidism   . Sarcoidosis   . Shortness of breath 11/08/2012   RECENTLY  & WHEN i EXERCISE"  . Solar lentigo 03/11/2008  Back - Atypical  . Thyroid disease   . Upper airway cough syndrome     Past Surgical History:  Procedure Laterality Date  . ABDOMINAL HYSTERECTOMY     Partial  . ESOPHAGOGASTRODUODENOSCOPY (EGD) WITH ESOPHAGEAL DILATION N/A 11/10/2012   Procedure: ESOPHAGOGASTRODUODENOSCOPY (EGD) WITH ESOPHAGEAL DILATION;  Surgeon: Gatha Mayer, MD;  Location: [facility];  Service: Endoscopy;  Laterality: N/A;  [place] vs. Balloon  . VIDEO  BRONCHOSCOPY WITH ENDOBRONCHIAL ULTRASOUND Bilateral 01/15/2015   Procedure: VIDEO BRONCHOSCOPY WITH ENDOBRONCHIAL ULTRASOUND of  LEFT LUNG LOWER LOBE and right lung;  Surgeon: Collene Gobble, MD;  [facility];  Service: Thoracic;  Laterality: Bilateral;    Family History  Problem Relation Age of Onset  . Diabetes Mother   . Stroke Mother   . Coronary artery disease Father   . Coronary artery disease Brother   . Hypothyroidism Maternal Grandmother   . Diabetes Maternal Grandfather   . Heart disease Paternal Grandmother   . Heart disease Paternal Grandfather     Social History   Tobacco Use  . Smoking status: Never Smoker  . Smokeless tobacco: Never Used  Substance Use Topics  . Alcohol use: No    Subjective:  Patient notes she has just "not felt well" for the past few weeks; seemed to start after taking her second COVID vaccine. Is worried that she has gotten very dehydrated from the vaccine. Notes that she saw her GYN last week and UTI was ruled out/ did have a yeast infection; has been feeling more achy lately and felt dizzy yesterday; is trying to manage her blood sugar with diet- feels that she has been trying to limit her carb intake.    Was started on Aldactone by her cardiologist in October but opted not to start it; overdue to see cardiology in follow-up.   Objective:  Vitals:   06/22/19 1316  BP: 130/82  Pulse: 72  Temp: 98.2 F (36.8 C)  TempSrc: Oral  SpO2: 96%  Weight: 204 lb (92.5 kg)  Height: 5' 2.5" (1.588 m)    General: Well developed, well nourished, in no acute distress  Skin : Warm and dry.  Head: Normocephalic and atraumatic  Lungs: Respirations unlabored; clear to auscultation bilaterally without wheeze, rales, rhonchi  CVS exam: normal rate and regular rhythm.  Neurologic: Alert and oriented; speech intact; face symmetrical; moves all extremities well; CNII-XII intact without focal deficit   Assessment:  1. Other specified hypothyroidism   2.  Type II diabetes mellitus with manifestations ([place])     Plan:  1. Check TSH today; 2. Check CBC, CMP, Hgba1c today; suspect she is going to need medication to help manage her diabetes. 3. Needs to schedule follow-up with her cardiologist as well. Needs to get stress test completed.  This visit occurred during the SARS-CoV-2 public health emergency.  Safety protocols were in place, including screening questions prior to the visit, additional usage of staff PPE, and extensive cleaning of exam room while observing appropriate contact time as indicated for disinfecting solutions.     No follow-ups on file.  Orders Placed This Encounter  Procedures  . Comp Met (CMET)  . HgB A1c  . TSH  . CBC w/Diff    Requested Prescriptions    No prescriptions requested or ordered in this encounter

## 2019-07-16 ENCOUNTER — Telehealth: Payer: Self-pay

## 2019-07-16 ENCOUNTER — Other Ambulatory Visit: Payer: Self-pay | Admitting: Family

## 2019-07-16 NOTE — Telephone Encounter (Signed)
Peter Congo,  Your blood sugar actually is looking better. You don't need to take medication at this time. Your labs all looked good. I would like you to follow-up with the cardiologist to complete your work up the .Marland KitchenMarland Kitchen

## 2019-07-16 NOTE — Telephone Encounter (Signed)
New message    Calling for the test results

## 2019-07-17 NOTE — Telephone Encounter (Signed)
Spoke with patient and info given 

## 2019-07-23 ENCOUNTER — Ambulatory Visit (INDEPENDENT_AMBULATORY_CARE_PROVIDER_SITE_OTHER): Payer: Medicare Other | Admitting: Family Medicine

## 2019-07-23 ENCOUNTER — Encounter: Payer: Self-pay | Admitting: Family Medicine

## 2019-07-23 ENCOUNTER — Other Ambulatory Visit: Payer: Self-pay

## 2019-07-23 VITALS — BP 138/34 | HR 71 | Ht 62.5 in | Wt 205.0 lb

## 2019-07-23 DIAGNOSIS — M791 Myalgia, unspecified site: Secondary | ICD-10-CM

## 2019-07-23 DIAGNOSIS — M545 Low back pain: Secondary | ICD-10-CM | POA: Diagnosis not present

## 2019-07-23 DIAGNOSIS — G8929 Other chronic pain: Secondary | ICD-10-CM

## 2019-07-23 MED ORDER — CELECOXIB 200 MG PO CAPS: 200.0000 mg | ORAL_CAPSULE | Freq: Two times a day (BID) | ORAL | 2 refills | Status: DC | PRN

## 2019-07-23 NOTE — Patient Instructions (Addendum)
Thank you for coming in today.  Please attend PT.  Use tylenol arthritis first line.  Use celebrex for more severe pain up to 2 x daily.  Recheck with me in 4 weeks.  Contact me or return sooner if needed.   Myalgia is the medical term for muscle pain and soreness.

## 2019-07-23 NOTE — Progress Notes (Signed)
Katrina [name] is a 64 y.o. female who presents to [place] at [facility] today for Shoulder and Rib pain. Patient last saw Dr. Tamala Julian for R hip pain on 04/02/2019. Patient reports that her L low back has been causing pain X3 months. Patient states she lives by herself and she moves furniture and lifted a chair over her head. She rates her pain as 8/10 and describes her pain as dull aching. Patient states she has had the pain in her left lower back area and was worried about possibly being a kidney.  Pain is dull and aching.  Pain not particularly worse with motion.  She tried ibuprofen which helps but cause stomach irritation.  She also notes generalized myalgias following COVID-19 vaccine.  Radiating pain: yes down the left side L UE numbness/tingling: yes UE weakness: no  Aggravating factors: laying on left side  Treatments tried: advil    Pertinent review of systems: No fevers or chills  Relevant historical information: History of sarcoidosis diabetes hypothyroidism   Exam:  BP (!) 138/34 (BP Location: Left Arm, Patient Position: Sitting, Cuff Size: Large)   Pulse 71   Ht 5' 2.5" (1.588 m)   Wt 205 lb (93 kg)   SpO2 94%   BMI 36.90 kg/m  General: Well Developed, well nourished, and in no acute distress.   MSK: L-spine nontender to spinal midline.  Not particularly tender to palpation paraspinal musculature. Normal lumbar motion pain with right lateral flexion and forward flexion. Lower extremity strength reflexes and sensation are intact throughout bilateral extremities.     Lab and Radiology Results  EXAM: LUMBAR SPINE - 3 VIEW  COMPARISON:  12/29/2015  FINDINGS: Four non rib-bearing lumbar vertebra are noted. A transitional segment with partial sacralization is identified representing L5. mild osteophytic changes are noted. Disc space narrowing is noted at L4-5. No anterolisthesis is seen. No soft tissue abnormality is noted.   IMPRESSION: 1. Mild degenerative change without acute abnormality. 2. 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   Electronically Signed   By: Inez Catalina M.D.   On: 02/01/2019 10:19  I, Lynne Leader, personally (independently) visualized and performed the interpretation of the images attached in this note.  Recent Results (from the past 2160 hour(s))  Comp Met (CMET)     Status: None   Collection Time: 06/22/19  1:46 PM  Result Value Ref Range   Sodium 139 135 - 145 mEq/L   Potassium 3.8 3.5 - 5.1 mEq/L   Chloride 104 96 - 112 mEq/L   CO2 28 19 - 32 mEq/L   Glucose, Bld 97 70 - 99 mg/dL   BUN 13 6 - 23 mg/dL   Creatinine, Ser 0.77 0.40 - 1.20 mg/dL   Total Bilirubin 0.5 0.2 - 1.2 mg/dL   Alkaline Phosphatase 72 39 - 117 U/L   AST 16 0 - 37 U/L   ALT 20 0 - 35 U/L   Total Protein 7.4 6.0 - 8.3 g/dL   Albumin 4.0 3.5 - 5.2 g/dL   GFR 75.17 >60.00 mL/min   Calcium 9.6 8.4 - 10.5 mg/dL  HgB A1c     Status: Abnormal   Collection Time: 06/22/19  1:46 PM  Result Value Ref Range   Hgb A1c MFr Bld 6.6 (H) 4.6 - 6.5 %    Comment: Glycemic Control Guidelines for People with Diabetes:Non Diabetic:  <6%Goal of Therapy: <7%Additional Action Suggested:  >8%   TSH     Status:  None   Collection Time: 06/22/19  1:46 PM  Result Value Ref Range   TSH 0.41 0.35 - 4.50 uIU/mL  CBC w/Diff     Status: None   Collection Time: 06/22/19  1:46 PM  Result Value Ref Range   WBC 9.4 4.0 - 10.5 K/uL   RBC 5.00 3.87 - 5.11 Mil/uL   Hemoglobin 14.1 12.0 - 15.0 g/dL   HCT 42.3 36.0 - 46.0 %   MCV 84.5 78.0 - 100.0 fl   MCHC 33.3 30.0 - 36.0 g/dL   RDW 14.4 11.5 - 15.5 %   Platelets 279.0 150.0 - 400.0 K/uL   Neutrophils Relative % 68.4 43.0 - 77.0 %   Lymphocytes Relative 23.3 12.0 - 46.0 %   Monocytes Relative 6.4 3.0 - 12.0 %   Eosinophils Relative 1.3 0.0 - 5.0 %   Basophils Relative 0.6 0.0 - 3.0 %   Neutro Abs 6.4 1.4 - 7.7 K/uL   Lymphs Abs 2.2 0.7 - 4.0 K/uL   Monocytes  Absolute 0.6 0.1 - 1.0 K/uL   Eosinophils Absolute 0.1 0.0 - 0.7 K/uL   Basophils Absolute 0.1 0.0 - 0.1 K/uL       Assessment and Plan: 63 y.o. female with left low back pain.  Symptoms ongoing now for about 3 months.  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  This certainly could be contributing to her pain.  Discussed options.  Plan for trial of physical therapy.  Also recommend stopping ibuprofen.  Use Tylenol arthritis on a more regular basis and to use Celebrex on bad days.  Patient also notes a general pattern of myalgias following her COVID-19 vaccine.  Recent lab work-up provided by PCP was reassuringly normal with normal TSH and sed rate.  However would proceed with further rheumatologic work-up in the future if not better.  Also would consider advanced imaging such as MRI if not better with conservative management trial.   PDMP not reviewed this encounter. Orders Placed This Encounter  Procedures  . Ambulatory referral to Physical Therapy    Referral Priority:   Routine    Referral Type:   Physical Medicine    Referral Reason:   Specialty Services Required    Requested Specialty:   Physical Therapy   Meds ordered this encounter  Medications  . celecoxib (CELEBREX) 200 MG capsule    Sig: Take 1 capsule (200 mg total) by mouth 2 (two) times daily as needed. One to 2 tablets by mouth daily as needed for pain.    Dispense:  60 capsule    Refill:  2     Discussed warning signs or symptoms. Please see discharge instructions. Patient expresses understanding.   The above documentation has been reviewed and is accurate and complete Evan Corey      

## 2019-07-24 ENCOUNTER — Ambulatory Visit: Payer: Medicare Other | Admitting: Family Medicine

## 2019-07-30 ENCOUNTER — Other Ambulatory Visit: Payer: Self-pay | Admitting: Family

## 2019-07-30 ENCOUNTER — Telehealth: Payer: Self-pay | Admitting: Internal Medicine

## 2019-07-30 NOTE — Telephone Encounter (Signed)
UNABLE TO CONTACT PATIENT/ VM NOT SET UP.Marland KitchenSENT MY CHART MESSAGE TO PATIENT.

## 2019-08-20 ENCOUNTER — Ambulatory Visit: Payer: Medicare Other | Admitting: Family Medicine

## 2019-10-17 ENCOUNTER — Emergency Department (HOSPITAL_COMMUNITY)
Admission: EM | Admit: 2019-10-17 | Discharge: 2019-10-18 | Disposition: A | Discharge status: Home / Self Care | Payer: Medicare Other | Attending: Emergency Medicine | Admitting: Emergency Medicine

## 2019-10-17 ENCOUNTER — Other Ambulatory Visit: Payer: Self-pay

## 2019-10-17 ENCOUNTER — Emergency Department (HOSPITAL_COMMUNITY): Payer: Medicare Other

## 2019-10-17 DIAGNOSIS — Z5321 Procedure and treatment not carried out due to patient leaving prior to being seen by health care provider: Secondary | ICD-10-CM | POA: Diagnosis not present

## 2019-10-17 DIAGNOSIS — R0789 Other chest pain: Secondary | ICD-10-CM | POA: Diagnosis not present

## 2019-10-17 LAB — CBC
HCT: 42.7 % (ref 36.0–46.0)
Hemoglobin: 13.8 g/dL (ref 12.0–15.0)
MCH: 27.3 pg (ref 26.0–34.0)
MCHC: 32.3 g/dL (ref 30.0–36.0)
MCV: 84.4 fL (ref 80.0–100.0)
Platelets: 279 10*3/uL (ref 150–400)
RBC: 5.06 MIL/uL (ref 3.87–5.11)
RDW: 13.7 % (ref 11.5–15.5)
WBC: 9.6 10*3/uL (ref 4.0–10.5)
nRBC: 0 % (ref 0.0–0.2)

## 2019-10-17 LAB — BASIC METABOLIC PANEL
Anion gap: 9 (ref 5–15)
BUN: 11 mg/dL (ref 8–23)
CO2: 23 mmol/L (ref 22–32)
Calcium: 8.8 mg/dL — ABNORMAL LOW (ref 8.9–10.3)
Chloride: 105 mmol/L (ref 98–111)
Creatinine, Ser: 0.83 mg/dL (ref 0.44–1.00)
GFR calc Af Amer: >60 mL/min (ref >60)
GFR calc non Af Amer: >60 mL/min (ref >60)
Glucose, Bld: 155 mg/dL — ABNORMAL HIGH (ref 70–99)
Potassium: 3.7 mmol/L (ref 3.5–5.1)
Sodium: 137 mmol/L (ref 135–145)

## 2019-10-17 LAB — TROPONIN I (HIGH SENSITIVITY): Troponin I (High Sensitivity): 4 ng/L (ref <18)

## 2019-10-17 MED ORDER — SODIUM CHLORIDE 0.9% FLUSH: 3.0000 mL | Freq: Once | INTRAVENOUS | Status: DC

## 2019-10-17 NOTE — ED Triage Notes (Signed)
Pt coming after developing upper abd pain that radiates into both sides of her chest up into her shoulders. Pt said that she was feeling bad and stopped taking it last week and restarted it again Monday. Michela Pitcher that she developed abd pain and joint pain Monday so she stopped taking it again that day, that was her most recent dose. Pt has now developed CP, and HA, and worsening abd pain. Checked BP @ home and it was 195/105

## 2019-10-17 NOTE — ED Notes (Signed)
Pt stated she did not want to wait a long time and would follow up with her doctor in the morningni

## 2019-11-13 ENCOUNTER — Encounter: Payer: Self-pay | Admitting: Family

## 2019-11-13 ENCOUNTER — Ambulatory Visit (INDEPENDENT_AMBULATORY_CARE_PROVIDER_SITE_OTHER): Payer: Medicare Other | Admitting: Family

## 2019-11-13 ENCOUNTER — Other Ambulatory Visit: Payer: Self-pay

## 2019-11-13 VITALS — BP 152/80 | HR 74 | Temp 97.2°F | Wt 207.8 lb

## 2019-11-13 DIAGNOSIS — M791 Myalgia, unspecified site: Secondary | ICD-10-CM

## 2019-11-13 DIAGNOSIS — E119 Type 2 diabetes mellitus without complications: Secondary | ICD-10-CM | POA: Diagnosis not present

## 2019-11-13 DIAGNOSIS — I10 Essential (primary) hypertension: Secondary | ICD-10-CM | POA: Diagnosis not present

## 2019-11-13 DIAGNOSIS — E039 Hypothyroidism, unspecified: Secondary | ICD-10-CM

## 2019-11-13 MED ORDER — METOPROLOL SUCCINATE ER 50 MG PO TB24
50.0000 mg | ORAL_TABLET | Freq: Every day | ORAL | 0 refills | Status: DC
Start: 2019-11-13 — End: 2020-03-07

## 2019-11-13 NOTE — Progress Notes (Signed)
Katrina [name] is a 64 y.o. female with the following history as recorded in EpicCare:  Patient Active Problem List   Diagnosis Date Noted  . Piriformis syndrome of right side 10/04/2018  . Abnormal electrocardiogram (ECG) (EKG) 08/22/2018  . Prediabetes 08/22/2018  . Hyperlipidemia with target LDL less than 130 08/22/2018  . Type II diabetes mellitus with manifestations ([place]) 08/22/2018  . Esophageal stricture   . Obesity (BMI 30.0-34.9)   . Lobar pneumonia ([place])   . Hypertension 09/27/2017  . 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 Low back pain associated with a spinal disorder other than radiculopathy or spinal stenosis 03/01/2016  . Myalgia 10/09/2015  . Upper airway cough syndrome 01/26/2015  . Obesity (BMI 30-39.9) 01/26/2015  . Pulmonary nodules   . DOE (dyspnea on exertion) 01/01/2015  . Pulmonary sarcoidosis ([place])   . Mediastinal lymphadenopathy 12/26/2014  . Esophageal ring, acquired 11/10/2012  . Dysphagia, unspecified(787.20) 11/09/2012  . Hypothyroidism 11/08/2012  . 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 Hypothyroidism, acquired 12/28/2007  . GERD 12/28/2007  . 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 albuterol (PROVENTIL HFA;VENTOLIN HFA) 108 (90 Base) MCG/ACT inhaler Inhale 1-2 puffs into the lungs every 6 (six) hours as needed for wheezing or shortness of breath. 1 Inhaler 0  . aspirin EC 81 MG tablet Take 1 tablet (81 mg total) by mouth daily. (Patient taking differently: Take 81 mg by mouth as needed. ) 90 tablet 1  . aspirin-acetaminophen-caffeine (EXCEDRIN MIGRAINE) 250-250-65 MG tablet Take 1 tablet by mouth every 6 (six) hours as needed for headache.    . celecoxib (CELEBREX) 200 MG capsule Take 1 capsule (200 mg total) by mouth 2 (two) times daily as needed. One to 2 tablets by mouth daily as needed for pain. 60 capsule 2  . diphenhydrAMINE  (BENADRYL) 25 MG tablet Take 1 tablet (25 mg total) by mouth every 8 (eight) hours as needed. 20 tablet 0  . ibuprofen (ADVIL) 200 MG tablet Take 200 mg by mouth every 6 (six) hours as needed for headache or mild pain.    Marland Kitchen omeprazole (PRILOSEC) 20 MG capsule Take 20 mg by mouth daily.    . polyvinyl alcohol (LIQUIFILM TEARS) 1.4 % ophthalmic solution Place 1 drop into both eyes as needed for dry eyes (itching/allergies).    Marland Kitchen PRED FORTE 1 % ophthalmic suspension 1 drop 2 (two) times daily.    Marland Kitchen saccharomyces boulardii (FLORASTOR) 250 MG capsule Take 1 capsule (250 mg total) by mouth 2 (two) times daily. (Patient taking differently: Take 250 mg by mouth daily. ) 60 capsule 0  . SYNTHROID 150 MCG tablet TAKE 1 TABLET(150 MCG) BY MOUTH DAILY BEFORE BREAKFAST 90 tablet 2  . metoprolol succinate (TOPROL-XL) 50 MG 24 hr tablet Take 1 tablet (50 mg total) by mouth daily. Take with or immediately following a meal. 90 tablet 0   No current facility-administered medications for this visit.    Allergies: Penicillins, Amlodipine, Lisinopril, Losartan, Metformin and related, Bactrim ds [sulfamethoxazole-trimethoprim], and Metformin  Past Medical History:  Diagnosis Date  . BCC (basal cell carcinoma of skin) Infundibulocystic 03/11/2008   Inner Left Eye  . Chest pain at rest 11/07/2012  . Diabetes mellitus without complication ([place])   . Eosinophilic esophagitis 17/79/3903  . Esophageal stricture   . Exercise-induced asthma   . GERD (gastroesophageal reflux disease)   . Hypertension 09/27/2017  .  Hypothyroidism   . Sarcoidosis   . Shortness of breath 11/08/2012   RECENTLY  & WHEN i EXERCISE"  . Solar lentigo 03/11/2008   Back - Atypical  . Thyroid disease   . Upper airway cough syndrome     Past Surgical History:  Procedure Laterality Date  . ABDOMINAL HYSTERECTOMY     Partial  . ESOPHAGOGASTRODUODENOSCOPY (EGD) WITH ESOPHAGEAL DILATION N/A 11/10/2012   Procedure: ESOPHAGOGASTRODUODENOSCOPY (EGD)  WITH ESOPHAGEAL DILATION;  Surgeon: Gatha Mayer, MD;  Location: [facility];  Service: Endoscopy;  Laterality: N/A;  [place] vs. [place]  . VIDEO BRONCHOSCOPY WITH ENDOBRONCHIAL ULTRASOUND Bilateral 01/15/2015   Procedure: VIDEO BRONCHOSCOPY WITH ENDOBRONCHIAL ULTRASOUND of  LEFT LUNG LOWER LOBE and right lung;  Surgeon: Collene Gobble, MD;  Location: [facility] OR;  Service: Thoracic;  Laterality: Bilateral;    Family History  Problem Relation Age of Onset  . Diabetes Mother   . Stroke Mother   . Coronary artery disease Father   . Coronary artery disease Brother   . Hypothyroidism Maternal Grandmother   . Diabetes Maternal Grandfather   . Heart disease Paternal Grandmother   . Heart disease Paternal Grandfather     Social History   Tobacco Use  . Smoking status: Never Smoker  . Smokeless tobacco: Never Used  Substance Use Topics  . Alcohol use: No    Subjective:  Would like to discuss her blood pressure medication; stopped Lisinopril in May because she was concerned it was causing her to feel weak; immediately felt better; re-started it last month because she was concerned about her blood pressure; Has not completed stress test as previously discussed; saw both Dr. Nadyne Coombes and Dr. Debara Pickett in the past year; was given Spironolactone by Dr. Nadyne Coombes but felt it upset her stomach- took 2 dosages and then stopped;  Has not been able to complete her sleep study- finances have been an issue; Continuing to have chronic myalgia- just hurting all over; has been recurrent issue for her for years; saw [facility]ologist at [facility] in her 5s- thought sleep issues contributing; immediate relief with Nortriptyline; didn't want to continue due to concern for weight gain;      Objective:  Vitals:   11/13/19 1224  BP: (!) 152/80  Pulse: 74  Temp: (!) 97.2 F (36.2 C)  TempSrc: Oral  SpO2: 95%  Weight: (!) 207 lb 12.8 oz (94.3 kg)    General: Well developed, well nourished, in no acute distress  Skin : Warm  and dry.  Head: Normocephalic and atraumatic  Lungs: Respirations unlabored; clear to auscultation bilaterally without wheeze, rales, rhonchi  CVS exam: normal rate and regular rhythm.  Musculoskeletal: No deformities; no active joint inflammation  Extremities: No edema, cyanosis, clubbing  Vessels: Symmetric bilaterally  Neurologic: Alert and oriented; speech intact; face symmetrical; moves all extremities well; CNII-XII intact without focal deficit   Assessment:  1. Essential hypertension   2. Myalgia   3. Hypothyroidism, acquired   4. Diet-controlled diabetes mellitus ([place])     Plan:  1. Uncontrolled; complicated by numerous medication allergies; will try Toprol XL 50 mg daily ( this is the last class of medications that she hasn't tried); does not like how she feels on diuretics either; cannot tolerate ACE, ARB, Metformin or Spironolactone; She is encouraged to see her cardiologist- phone number provided; 2. ? Fibromyalgia; reviewed labs and ANA was negative; will discuss with sports med provider as he has treated OA changes as well; to consider rheumatology consult and/ or  trial of Cymbalta;  3. Check TSH today; 4. Check hgba1c today;   This visit occurred during the SARS-CoV-2 public health emergency.  Safety protocols were in place, including screening questions prior to the visit, additional usage of staff PPE, and extensive cleaning of exam room while observing appropriate contact time as indicated for disinfecting solutions.     No follow-ups on file.  Orders Placed This Encounter  Procedures  . CBC with Differential/Platelet    Standing Status:   Future    Number of Occurrences:   1    Standing Expiration Date:   11/12/2020  . Comp Met (CMET)    Standing Status:   Future    Number of Occurrences:   1    Standing Expiration Date:   11/12/2020  . Antinuclear Antib (ANA)    Standing Status:   Future    Number of Occurrences:   1    Standing Expiration Date:   11/12/2020   . Sedimentation rate    Standing Status:   Future    Number of Occurrences:   1    Standing Expiration Date:   11/12/2020  . Rheumatoid Factor    Standing Status:   Future    Number of Occurrences:   1    Standing Expiration Date:   11/12/2020  . TSH    Standing Status:   Future    Number of Occurrences:   1    Standing Expiration Date:   11/12/2020  . Hemoglobin A1c    Standing Status:   Future    Number of Occurrences:   1    Standing Expiration Date:   11/12/2020    Requested Prescriptions   Signed Prescriptions Disp Refills  . metoprolol succinate (TOPROL-XL) 50 MG 24 hr tablet 90 tablet 0    Sig: Take 1 tablet (50 mg total) by mouth daily. Take with or immediately following a meal.

## 2019-11-13 NOTE — Patient Instructions (Signed)
Please schedule a follow up with Dr. Debara Pickett at [phone]

## 2019-11-17 LAB — ANA: Anti Nuclear Antibody (ANA): POSITIVE — AB

## 2019-11-17 LAB — CBC WITH DIFFERENTIAL/PLATELET
Absolute Monocytes: 502 cells/uL (ref 200–950)
Basophils Absolute: 57 cells/uL (ref 0–200)
Basophils Relative: 0.7 %
Eosinophils Absolute: 8 cells/uL — ABNORMAL LOW (ref 15–500)
Eosinophils Relative: 0.1 %
HCT: 45.1 % — ABNORMAL HIGH (ref 35.0–45.0)
Hemoglobin: 14.8 g/dL (ref 11.7–15.5)
Lymphs Abs: 1717 cells/uL (ref 850–3900)
MCH: 28.1 pg (ref 27.0–33.0)
MCHC: 32.8 g/dL (ref 32.0–36.0)
MCV: 85.6 fL (ref 80.0–100.0)
MPV: 10.6 fL (ref 7.5–12.5)
Monocytes Relative: 6.2 %
Neutro Abs: 5816 cells/uL (ref 1500–7800)
Neutrophils Relative %: 71.8 %
Platelets: 288 10*3/uL (ref 140–400)
RBC: 5.27 10*6/uL — ABNORMAL HIGH (ref 3.80–5.10)
RDW: 13.8 % (ref 11.0–15.0)
Total Lymphocyte: 21.2 %
WBC: 8.1 10*3/uL (ref 3.8–10.8)

## 2019-11-17 LAB — COMPREHENSIVE METABOLIC PANEL
AG Ratio: 1.4 (calc) (ref 1.0–2.5)
ALT: 24 U/L (ref 6–29)
AST: 20 U/L (ref 10–35)
Albumin: 4.1 g/dL (ref 3.6–5.1)
Alkaline phosphatase (APISO): 79 U/L (ref 37–153)
BUN: 12 mg/dL (ref 7–25)
CO2: 25 mmol/L (ref 20–32)
Calcium: 9.3 mg/dL (ref 8.6–10.4)
Chloride: 99 mmol/L (ref 98–110)
Creat: 0.73 mg/dL (ref 0.50–0.99)
Globulin: 2.9 g/dL (calc) (ref 1.9–3.7)
Glucose, Bld: 155 mg/dL — ABNORMAL HIGH (ref 65–99)
Potassium: 4.3 mmol/L (ref 3.5–5.3)
Sodium: 137 mmol/L (ref 135–146)
Total Bilirubin: 0.6 mg/dL (ref 0.2–1.2)
Total Protein: 7 g/dL (ref 6.1–8.1)

## 2019-11-17 LAB — ANTI-NUCLEAR AB-TITER (ANA TITER)
ANA TITER: 1:40 {titer} — ABNORMAL HIGH
ANA Titer 1: 1:40 {titer} — ABNORMAL HIGH

## 2019-11-17 LAB — HEMOGLOBIN A1C
Hgb A1c MFr Bld: 7.1 % of total Hgb — ABNORMAL HIGH (ref <5.7)
Mean Plasma Glucose: 157 (calc)
eAG (mmol/L): 8.7 (calc)

## 2019-11-17 LAB — SEDIMENTATION RATE: Sed Rate: 14 mm/h (ref 0–30)

## 2019-11-17 LAB — RHEUMATOID FACTOR: Rheumatoid fact SerPl-aCnc: <14 IU/mL (ref <14)

## 2019-11-17 LAB — TSH: TSH: 4.06 mIU/L (ref 0.40–4.50)

## 2019-11-23 ENCOUNTER — Telehealth: Payer: Self-pay | Admitting: Family Medicine

## 2019-11-23 NOTE — Telephone Encounter (Signed)
Patient notified via My Chart

## 2019-11-23 NOTE — Telephone Encounter (Signed)
Patient called stating that she was previously prescribed Lisinopril but it caused her blood pressure to go up instead of down. She stopped taking it and that helped her blood pressure go back to normal. Slowly her blood pressure began to go back up so she started taking it again on July 1st. She immediatly noticed foot and joint pain. She stopped taking the Lisinopril 2 weeks ago and the pain has continued. She said that it is so bad she can hardly walk. She went ahead and scheduled an appointment with Jodi Mourning (PCP) for this issue but did not know if this would be more Sports Med related. I also scheduled her for an appointment with Dr Georgina Snell on Monday just in case.  Any advise? She is worried that this could be causing a bigger issue or something more serious could be going on. Also, should she see [place] or Primary Care?

## 2019-11-26 ENCOUNTER — Ambulatory Visit (INDEPENDENT_AMBULATORY_CARE_PROVIDER_SITE_OTHER): Payer: Medicare Other | Admitting: Family

## 2019-11-26 ENCOUNTER — Ambulatory Visit (INDEPENDENT_AMBULATORY_CARE_PROVIDER_SITE_OTHER): Payer: Medicare Other

## 2019-11-26 ENCOUNTER — Ambulatory Visit (INDEPENDENT_AMBULATORY_CARE_PROVIDER_SITE_OTHER): Payer: Medicare Other | Admitting: Family Medicine

## 2019-11-26 ENCOUNTER — Encounter: Payer: Self-pay | Admitting: Family

## 2019-11-26 ENCOUNTER — Other Ambulatory Visit: Payer: Self-pay

## 2019-11-26 ENCOUNTER — Encounter: Payer: Self-pay | Admitting: Family Medicine

## 2019-11-26 VITALS — BP 140/80 | Ht 62.0 in | Wt 211.0 lb

## 2019-11-26 VITALS — BP 148/80 | HR 76 | Temp 97.9°F | Ht 62.0 in | Wt 210.0 lb

## 2019-11-26 DIAGNOSIS — R590 Localized enlarged lymph nodes: Secondary | ICD-10-CM

## 2019-11-26 DIAGNOSIS — E119 Type 2 diabetes mellitus without complications: Secondary | ICD-10-CM | POA: Diagnosis not present

## 2019-11-26 DIAGNOSIS — M79671 Pain in right foot: Secondary | ICD-10-CM

## 2019-11-26 DIAGNOSIS — E039 Hypothyroidism, unspecified: Secondary | ICD-10-CM

## 2019-11-26 DIAGNOSIS — M79672 Pain in left foot: Secondary | ICD-10-CM

## 2019-11-26 DIAGNOSIS — M791 Myalgia, unspecified site: Secondary | ICD-10-CM

## 2019-11-26 DIAGNOSIS — I10 Essential (primary) hypertension: Secondary | ICD-10-CM | POA: Diagnosis not present

## 2019-11-26 DIAGNOSIS — M2550 Pain in unspecified joint: Secondary | ICD-10-CM

## 2019-11-26 MED ORDER — DULOXETINE HCL 30 MG PO CPEP
30.0000 mg | ORAL_CAPSULE | Freq: Every day | ORAL | 0 refills | Status: DC
Start: 2019-11-26 — End: 2019-12-27

## 2019-11-26 MED ORDER — LEVOTHYROXINE SODIUM 175 MCG PO TABS: 175.0000 ug | ORAL_TABLET | Freq: Every day | ORAL | 1 refills | Status: DC

## 2019-11-26 MED ORDER — PREDNISONE 20 MG PO TABS
20.0000 mg | ORAL_TABLET | Freq: Every day | ORAL | 0 refills | Status: DC
Start: 2019-11-26 — End: 2019-12-27

## 2019-11-26 NOTE — Progress Notes (Signed)
I, Wendy Poet, LAT, ATC, am serving as scribe for Dr. Lynne Leader.  Katrina [name] is a 64 y.o. female who presents to [place] at [facility] today for f/u of foot pain that she feels may be attributed to Lisinopril.  She was last seen by Dr. Georgina Snell on 07/23/19 for L-sided LBP.  Since her last visit, she reports foot and joint pain that began after she started taking Lisinopril on 10/18/19 and noticed immediate foot and joint pain.  She reports having stopped the Lisinopril but con't to have foot pain.  She locates her pain to her entire foot, L>R.  In her L foot, she is having more isolated heel pain.  She is also having pain in her B IT and is having B knee stiffness.  She just finished a visit w/ Jodi Mourning, FNP who prescribed Cymbalta and a 5 day steroid dose pack.   Pertinent review of systems: No fevers or chills  Relevant historical information: Fibromyalgia.  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   Exam:  BP 140/80 (BP Location: Right Arm, Patient Position: Sitting, Cuff Size: Normal)   Ht 5\' 2"  (1.575 m)   Wt 211 lb (95.7 kg)   BMI 38.59 kg/m  General: Well Developed, well nourished, and in no acute distress.   MSK: Feet bilaterally normal-appearing tender palpation plantar calcaneus otherwise normal-appearing with normal motion.    Lab and Radiology Results  2 view chest x-ray images obtained today personally independently reviewed. Mild cardiomegaly with mediastinal lymphadenopathy present. Await formal radiology review.  X-ray images left foot obtained today personally independently reviewed Mild degenerative changes.  No plantar calcaneal spur.  No fractures. Await formal radiology review    Assessment and Plan: 64 y.o. female with generalized myalgias and arthralgias with new foot pain today.  I am concerned that [place] has an undiagnosed rheumatologic problem.  She had limited rheum work-up a few  weeks ago that was positive only for mildly elevated ANA with a titer of 1:40.  Sed rate and rheumatoid factor were normal.  She notes that her arthralgias and myalgias worsen with ACE inhibitor's such as lisinopril.  I think it is possible she has sarcoidosis certainly as she had some concern for granulomatous change on lymph nodes with work-up a few years ago. Plan for finishing rheumatologic work-up below with ACE level, CK, CRP.  Additionally check chest x-ray.  I think the foot pain could certainly be part of the rheumatologic process or related to fibromyalgia however think is more likely she has a little bit of plantar fasciitis.  X-ray today is largely unremarkable per my work-up.  Plan for treatment with eccentric exercises.  Recheck in about a month.  Orders Placed This Encounter  Procedures  . DG Chest 2 View    Standing Status:   Future    Number of Occurrences:   1    Standing Expiration Date:   11/25/2020    Order Specific Question:   Reason for Exam (SYMPTOM  OR DIAGNOSIS REQUIRED)    Answer:   eval poss sarcoidosis    Order Specific Question:   Preferred imaging location?    Answer:   [facility]    Order Specific Question:   Radiology Contrast Protocol - do NOT remove file path    Answer:   [email or web address]  . DG Foot Complete Left    Standing Status:   Future    Number of Occurrences:   1  Standing Expiration Date:   11/25/2020    Order Specific Question:   Reason for Exam (SYMPTOM  OR DIAGNOSIS REQUIRED)    Answer:   eval foot pain    Order Specific Question:   Preferred imaging location?    Answer:   [facility]    Order Specific Question:   Radiology Contrast Protocol - do NOT remove file path    Answer:   [email or web address]  . Angiotensin converting enzyme    Standing Status:   Future    Number of Occurrences:   1    Standing Expiration Date:   11/25/2020  . CK     Standing Status:   Future    Number of Occurrences:   1    Standing Expiration Date:   11/25/2020  . C-reactive protein    Standing Status:   Future    Number of Occurrences:   1    Standing Expiration Date:   11/25/2020   No orders of the defined types were placed in this encounter.    Discussed warning signs or symptoms. Please see discharge instructions. Patient expresses understanding.   The above documentation has been reviewed and is accurate and complete Lynne Leader, M.D.

## 2019-11-26 NOTE — Patient Instructions (Signed)
Lets treat for plantar fasciitis.  Get labs and xray today.  Recheck in 1 month.  Return sooner if needed.  Start Cymbalta as prescribed.   This could be sarcoidosis.   Do the heel exercises 10-30 reps 2-3x daily.   Go from up to down slowly.

## 2019-11-26 NOTE — Progress Notes (Signed)
Katrina [name] is a 64 y.o. female with the following history as recorded in EpicCare:  Patient Active Problem List   Diagnosis Date Noted  . Piriformis syndrome of right side 10/04/2018  . Abnormal electrocardiogram (ECG) (EKG) 08/22/2018  . Prediabetes 08/22/2018  . Hyperlipidemia with target LDL less than 130 08/22/2018  . Type II diabetes mellitus with manifestations ([place]) 08/22/2018  . Esophageal stricture   . Obesity (BMI 30.0-34.9)   . Lobar pneumonia ([place])   . Hypertension 09/27/2017  . 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 Low back pain associated with a spinal disorder other than radiculopathy or spinal stenosis 03/01/2016  . Myalgia 10/09/2015  . Upper airway cough syndrome 01/26/2015  . Obesity (BMI 30-39.9) 01/26/2015  . Pulmonary nodules   . DOE (dyspnea on exertion) 01/01/2015  . Pulmonary sarcoidosis ([place])   . Mediastinal lymphadenopathy 12/26/2014  . Esophageal ring, acquired 11/10/2012  . Dysphagia, unspecified(787.20) 11/09/2012  . Hypothyroidism 11/08/2012  . 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 Hypothyroidism, acquired 12/28/2007  . GERD 12/28/2007  . 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 albuterol (PROVENTIL HFA;VENTOLIN HFA) 108 (90 Base) MCG/ACT inhaler Inhale 1-2 puffs into the lungs every 6 (six) hours as needed for wheezing or shortness of breath. 1 Inhaler 0  . aspirin EC 81 MG tablet Take 1 tablet (81 mg total) by mouth daily. (Patient taking differently: Take 81 mg by mouth as needed. ) 90 tablet 1  . aspirin-acetaminophen-caffeine (EXCEDRIN MIGRAINE) 250-250-65 MG tablet Take 1 tablet by mouth every 6 (six) hours as needed for headache.    . celecoxib (CELEBREX) 200 MG capsule Take 1 capsule (200 mg total) by mouth 2 (two) times daily as needed. One to 2 tablets by mouth daily as needed for pain. 60 capsule 2  . diphenhydrAMINE  (BENADRYL) 25 MG tablet Take 1 tablet (25 mg total) by mouth every 8 (eight) hours as needed. 20 tablet 0  . ibuprofen (ADVIL) 200 MG tablet Take 200 mg by mouth every 6 (six) hours as needed for headache or mild pain.    Marland Kitchen levothyroxine (SYNTHROID) 175 MCG tablet Take 1 tablet (175 mcg total) by mouth daily before breakfast. 90 tablet 1  . metoprolol succinate (TOPROL-XL) 50 MG 24 hr tablet Take 1 tablet (50 mg total) by mouth daily. Take with or immediately following a meal. 90 tablet 0  . omeprazole (PRILOSEC) 20 MG capsule Take 20 mg by mouth daily.    . polyvinyl alcohol (LIQUIFILM TEARS) 1.4 % ophthalmic solution Place 1 drop into both eyes as needed for dry eyes (itching/allergies).    Marland Kitchen PRED FORTE 1 % ophthalmic suspension 1 drop 2 (two) times daily.    Marland Kitchen saccharomyces boulardii (FLORASTOR) 250 MG capsule Take 1 capsule (250 mg total) by mouth 2 (two) times daily. (Patient taking differently: Take 250 mg by mouth daily. ) 60 capsule 0  . DULoxetine (CYMBALTA) 30 MG capsule Take 1 capsule (30 mg total) by mouth daily. 90 capsule 0  . predniSONE (DELTASONE) 20 MG tablet Take 1 tablet (20 mg total) by mouth daily with breakfast. 5 tablet 0   No current facility-administered medications for this visit.    Allergies: Penicillins, Amlodipine, Lisinopril, Losartan, Metformin and related, Bactrim ds [sulfamethoxazole-trimethoprim], and Metformin  Past Medical History:  Diagnosis Date  . BCC (basal cell carcinoma of skin) Infundibulocystic 03/11/2008   Inner Left Eye  .  Chest pain at rest 11/07/2012  . Diabetes mellitus without complication ([place])   . Eosinophilic esophagitis 34/74/2595  . Esophageal stricture   . Exercise-induced asthma   . GERD (gastroesophageal reflux disease)   . Hypertension 09/27/2017  . Hypothyroidism   . Sarcoidosis   . Shortness of breath 11/08/2012   RECENTLY  & WHEN i EXERCISE"  . Solar lentigo 03/11/2008   Back - Atypical  . Thyroid disease   . Upper airway  cough syndrome     Past Surgical History:  Procedure Laterality Date  . ABDOMINAL HYSTERECTOMY     Partial  . ESOPHAGOGASTRODUODENOSCOPY (EGD) WITH ESOPHAGEAL DILATION N/A 11/10/2012   Procedure: ESOPHAGOGASTRODUODENOSCOPY (EGD) WITH ESOPHAGEAL DILATION;  Surgeon: Gatha Mayer, MD;  Location: [facility];  Service: Endoscopy;  Laterality: N/A;  [place] vs. [place]  . VIDEO BRONCHOSCOPY WITH ENDOBRONCHIAL ULTRASOUND Bilateral 01/15/2015   Procedure: VIDEO BRONCHOSCOPY WITH ENDOBRONCHIAL ULTRASOUND of  LEFT LUNG LOWER LOBE and right lung;  Surgeon: Collene Gobble, MD;  Location: [facility];  Service: Thoracic;  Laterality: Bilateral;    Family History  Problem Relation Age of Onset  . Diabetes Mother   . Stroke Mother   . Coronary artery disease Father   . Coronary artery disease Brother   . Hypothyroidism Maternal Grandmother   . Diabetes Maternal Grandfather   . Heart disease Paternal Grandmother   . Heart disease Paternal Grandfather     Social History   Tobacco Use  . Smoking status: Never Smoker  . Smokeless tobacco: Never Used  Substance Use Topics  . Alcohol use: No    Subjective:   Complaining of increased joint pains/ needs to review labs from most recent OV; is scheduled to see sports medicine later today about persisting foot pain;  Of note, has opted not to start the new blood pressure medication that was prescribed at last visit; wants to try and work on dietary changes/ is limiting all carbs and sodas to try and help limit inflammation.   Objective:  Vitals:   11/26/19 1147  BP: (!) 148/80  Pulse: 76  Temp: 97.9 F (36.6 C)  TempSrc: Oral  SpO2: 95%  Weight: 210 lb (95.3 kg)  Height: 5\' 2"  (1.575 m)    General: Well developed, well nourished, in no acute distress  Skin : Warm and dry.  Head: Normocephalic and atraumatic  Lungs: Respirations unlabored; clear to auscultation bilaterally without wheeze, rales, rhonchi  Musculoskeletal: No deformities; no active  joint inflammation  Extremities: No edema, cyanosis, clubbing  Vessels: Symmetric bilaterally  Neurologic: Alert and oriented; speech intact; face symmetrical; moves all extremities well; CNII-XII intact without focal deficit   Assessment:  1. Essential hypertension   2. Hypothyroidism, acquired   3. Polyarthralgia   4. Diet-controlled type 2 diabetes mellitus ([place])     Plan:  1. Patient is working on dietary changes and does not want to take medication at this time; re-check in 3 months; 2. Increase Synthroid dosage;  3. ? Fibromyalgia vs underlying rheum source; trial of Cymbalta 30 mg daily;  4. Plan to re-check hgba1c in 3 months; patient defers medication at this time;  This visit occurred during the SARS-CoV-2 public health emergency.  Safety protocols were in place, including screening questions prior to the visit, additional usage of staff PPE, and extensive cleaning of exam room while observing appropriate contact time as indicated for disinfecting solutions.      Return in about 3 months (around 02/26/2020).  No orders of  the defined types were placed in this encounter.   Requested Prescriptions   Signed Prescriptions Disp Refills  . DULoxetine (CYMBALTA) 30 MG capsule 90 capsule 0    Sig: Take 1 capsule (30 mg total) by mouth daily.  Marland Kitchen levothyroxine (SYNTHROID) 175 MCG tablet 90 tablet 1    Sig: Take 1 tablet (175 mcg total) by mouth daily before breakfast.  . predniSONE (DELTASONE) 20 MG tablet 5 tablet 0    Sig: Take 1 tablet (20 mg total) by mouth daily with breakfast.

## 2019-11-26 NOTE — Progress Notes (Signed)
Reviewed with patient in office;

## 2019-11-27 LAB — C-REACTIVE PROTEIN: CRP: 22.1 mg/L — ABNORMAL HIGH (ref <8.0)

## 2019-11-27 NOTE — Progress Notes (Signed)
Chest x-ray looks okay to radiology.  Heart looks a little enlarged.  However this was evaluated in October with cardiology with echocardiogram.

## 2019-11-27 NOTE — Progress Notes (Signed)
Tiny heel spur at the Achilles tendon present.  Arthritis present at the big toe.  Otherwise foot x-ray looks pretty normal

## 2019-11-27 NOTE — Progress Notes (Signed)
C-reactive protein is elevated.  This is a marker of inflammation.  Other labs including angiotensin-converting enzyme and CK are still pending.

## 2019-11-28 LAB — SPECIMEN COMPROMISED

## 2019-11-28 LAB — ANGIOTENSIN CONVERTING ENZYME: Angiotensin-Converting Enzyme: 63 U/L (ref 9–67)

## 2019-11-28 LAB — CK: Total CK: 116 U/L (ref 29–143)

## 2019-11-28 NOTE — Progress Notes (Signed)
Angiotensin-converting enzyme level is normal.  This is decreased from 4 years ago.  Total CK is normal.  CRP is a little elevated and a sign of general inflammation.  Rheumatologic work-up so far has been largely negative.  Recommend trying the exercises first and if not better than it may make sense to have a rheumatology referral.

## 2019-12-27 ENCOUNTER — Ambulatory Visit (INDEPENDENT_AMBULATORY_CARE_PROVIDER_SITE_OTHER): Payer: Medicare Other | Admitting: Family Medicine

## 2019-12-27 ENCOUNTER — Encounter: Payer: Self-pay | Admitting: Family Medicine

## 2019-12-27 ENCOUNTER — Other Ambulatory Visit: Payer: Self-pay

## 2019-12-27 VITALS — BP 140/88 | HR 64 | Ht 62.0 in | Wt 210.2 lb

## 2019-12-27 DIAGNOSIS — M79672 Pain in left foot: Secondary | ICD-10-CM

## 2019-12-27 DIAGNOSIS — M791 Myalgia, unspecified site: Secondary | ICD-10-CM

## 2019-12-27 DIAGNOSIS — I10 Essential (primary) hypertension: Secondary | ICD-10-CM

## 2019-12-27 DIAGNOSIS — M79671 Pain in right foot: Secondary | ICD-10-CM | POA: Diagnosis not present

## 2019-12-27 MED ORDER — PREDNISONE 20 MG PO TABS: 20.0000 mg | ORAL_TABLET | Freq: Every day | ORAL | 0 refills | Status: DC

## 2019-12-27 MED ORDER — DULOXETINE HCL 30 MG PO CPEP: 30.0000 mg | ORAL_CAPSULE | Freq: Every day | ORAL | 0 refills | Status: DC

## 2019-12-27 NOTE — Progress Notes (Signed)
   I, Wendy Poet, LAT, ATC, am serving as scribe for Dr. Lynne Leader.  Katrina [name] is a 64 y.o. female who presents to [place] at [facility] today for f/u of B foot pain, L>R, that she feels started after beginning Lisinopril.  She was last seen by Dr. Georgina Snell on 11/26/19 and was prescribed Cymbalta and shown [place]'s exercises by Dr. Georgina Snell.  Since her last visit, pt reports that she wasn't able to get the Cymbalta due to it being too expensive.  Jodi Mourning prescribed her some prednisone which took her pain away.  However, then her BP increased and she was prescribed a beta blocker but was nervous about taking it due to the side effects.  So instead she took the Lisinopril which has now made her foot pain return, L>R.  She was also prescribed Spironolactone 25 mg.   Diagnostic testing: L foot XR- 11/26/19   Pertinent review of systems: No fevers or chills  Relevant historical information: Sarcoidosis   Exam:  BP 140/88 (BP Location: Right Arm, Patient Position: Sitting, Cuff Size: Large)   Pulse 64   Ht 5\' 2"  (1.575 m)   Wt 210 lb 3.2 oz (95.3 kg)   SpO2 96%   BMI 38.45 kg/m  General: Well Developed, well nourished, and in no acute distress.   MSK: Normal gait     Assessment and Plan: 64 y.o. female with polymyalgia and arthralgia.  Some concern for rheumatologic condition.  Her pain is very responsive to oral steroids.  She would like to repeat a short course of prednisone which is reasonable today and will prescribe.  Rheumatologic work-up was nondiagnostic recently.  If she has very responsive pain to steroids would consider referral to rheumatology for second look.  Is possible she has seronegative rheumatoid arthritis in addition to her possible sarcoidosis..  For overall pain prescribed Cymbalta at the last visit.  She never took it because at the [facility] it was going to be 100s of dollars.  After reviewing good Rx she can get a 55-month supply for about  $16.  She is willing to try Cymbalta and requested I send the medicine to [facility].  We also spent time discussing her blood pressure.  She has had significant difficulty with lisinopril.  Agree with not taking that medicine anymore.  Her PCP prescribed metoprolol which she never took and instead started taking some old leftover spironolactone that was prescribed previously.  She has been taking it for 1 day and feels pretty well with it.  Discussed that she needs to discuss this with her PCP.  Spironolactone requires monitoring especially potassium values and for starting.  Advised that she is probably going to require more than 1 medication to control her blood pressure.  Would consider hydrochlorothiazide plus spironolactone however will defer to PCP on this.   Meds ordered this encounter  Medications  . predniSONE (DELTASONE) 20 MG tablet    Sig: Take 1 tablet (20 mg total) by mouth daily with breakfast.    Dispense:  5 tablet    Refill:  0     Discussed warning signs or symptoms. Please see discharge instructions. Patient expresses understanding.   The above documentation has been reviewed and is accurate and complete Lynne Leader, M.D.  Total encounter time 30 minutes including face-to-face time with the patient and charting on the date of service.

## 2019-12-27 NOTE — Patient Instructions (Addendum)
Thank you for coming in today. Let your doctor know you start spironolactone 25mg  daily and did not start metoprolol.  That medicine should be ok but typically requires a lab about 7-30 days after starting it.   Start cymbalta and use prednisone limited.   Recheck with me in 2 months.  Let me know if you are not doing well.

## 2020-01-18 ENCOUNTER — Encounter: Payer: Self-pay | Admitting: Family Medicine

## 2020-01-18 ENCOUNTER — Other Ambulatory Visit: Payer: Self-pay

## 2020-01-18 ENCOUNTER — Ambulatory Visit (INDEPENDENT_AMBULATORY_CARE_PROVIDER_SITE_OTHER): Payer: Medicare Other | Admitting: Family Medicine

## 2020-01-18 ENCOUNTER — Ambulatory Visit: Payer: Self-pay

## 2020-01-18 VITALS — BP 136/84 | HR 86 | Ht 62.0 in

## 2020-01-18 DIAGNOSIS — E038 Other specified hypothyroidism: Secondary | ICD-10-CM

## 2020-01-18 DIAGNOSIS — M25551 Pain in right hip: Secondary | ICD-10-CM | POA: Diagnosis not present

## 2020-01-18 MED ORDER — LEVOTHYROXINE SODIUM 150 MCG PO TABS
150.0000 ug | ORAL_TABLET | Freq: Every day | ORAL | 1 refills | Status: DC
Start: 2020-01-18 — End: 2020-05-19

## 2020-01-18 NOTE — Patient Instructions (Addendum)
Thank you for coming in today.  Call or go to the ER if you develop a large red swollen joint with extreme pain or oozing puss.   Plan for PT.   Go back to thyroid 126mcg.  Follow up with [name] in about 1 month - 6 weeks.  Let her know ahead of time.     Hip Bursitis Rehab Ask your health care provider which exercises are safe for you. Do exercises exactly as told by your health care provider and adjust them as directed. It is normal to feel mild stretching, pulling, tightness, or discomfort as you do these exercises. Stop right away if you feel sudden pain or your pain gets worse. Do not begin these exercises until told by your health care provider. Stretching exercise This exercise warms up your muscles and joints and improves the movement and flexibility of your hip. This exercise also helps to relieve pain and stiffness. Iliotibial band stretch An iliotibial band is a strong band of muscle tissue that runs from the outer side of your hip to the outer side of your thigh and knee. 1. Lie on your side with your left / right leg in the top position. 2. Bend your left / right knee and grab your ankle. Stretch out your bottom arm to help you balance. 3. Slowly bring your knee back so your thigh is behind your body. 4. Slowly lower your knee toward the floor until you feel a gentle stretch on the outside of your left / right thigh. If you do not feel a stretch and your knee will not fall farther, place the heel of your other foot on top of your knee and pull your knee down toward the floor with your foot. 5. Hold this position for __________ seconds. 6. Slowly return to the starting position. Repeat __________ times. Complete this exercise __________ times a day. Strengthening exercises These exercises build strength and endurance in your hip and pelvis. Endurance is the ability to use your muscles for a long time, even after they get tired. Bridge This exercise strengthens the muscles that  move your thigh backward (hip extensors). 1. Lie on your back on a firm surface with your knees bent and your feet flat on the floor. 2. Tighten your buttocks muscles and lift your buttocks off the floor until your trunk is level with your thighs. ? Do not arch your back. ? You should feel the muscles working in your buttocks and the back of your thighs. If you do not feel these muscles, slide your feet 1-2 inches (2.5-5 cm) farther away from your buttocks. ? If this exercise is too easy, try doing it with your arms crossed over your chest. 3. Hold this position for __________ seconds. 4. Slowly lower your hips to the starting position. 5. Let your muscles relax completely after each repetition. Repeat __________ times. Complete this exercise __________ times a day. Squats This exercise strengthens the muscles in front of your thigh and knee (quadriceps). 1. Stand in front of a table, with your feet and knees pointing straight ahead. You may rest your hands on the table for balance but not for support. 2. Slowly bend your knees and lower your hips like you are going to sit in a chair. ? Keep your weight over your heels, not over your toes. ? Keep your lower legs upright so they are parallel with the table legs. ? Do not let your hips go lower than your knees. ? Do not  bend lower than told by your health care provider. ? If your hip pain increases, do not bend as low. 3. Hold the squat position for __________ seconds. 4. Slowly push with your legs to return to standing. Do not use your hands to pull yourself to standing. Repeat __________ times. Complete this exercise __________ times a day. Hip hike 1. Stand sideways on a bottom step. Stand on your left / right leg with your other foot unsupported next to the step. You can hold on to the railing or wall for balance if needed. 2. Keep your knees straight and your torso square. Then lift your left / right hip up toward the ceiling. 3. Hold  this position for __________ seconds. 4. Slowly let your left / right hip lower toward the floor, past the starting position. Your foot should get closer to the floor. Do not lean or bend your knees. Repeat __________ times. Complete this exercise __________ times a day. Single leg stand 1. Without shoes, stand near a railing or in a doorway. You may hold on to the railing or door frame as needed for balance. 2. Squeeze your left / right buttock muscles, then lift up your other foot. ? Do not let your left / right hip push out to the side. ? It is helpful to stand in front of a mirror for this exercise so you can watch your hip. 3. Hold this position for __________ seconds. Repeat __________ times. Complete this exercise __________ times a day. This information is not intended to replace advice given to you by your health care provider. Make sure you discuss any questions you have with your health care provider. Document Revised: 07/31/2018 Document Reviewed: 07/31/2018 Elsevier Patient Education  [place].

## 2020-01-18 NOTE — Progress Notes (Signed)
Katrina [name] is a 64 y.o. female who presents to [place] at [facility] today for back pain. Patient was last seen by Dr. Georgina [name] on 12/27/2019 for Myalgia and foot pain. Today patient reports that  last week did lunges 10 each side (that did not hurt while doing but did feel weak) and thinks that laid the foundation of the pain. States then was vacuuming last Sunday and after she was done she sat down and when she tried to stand up her Upper buttock lower R back started to hurt but just feels like that whole hip/back is very weak. states when she gets up in the morning she feels like "that side is not even there." taking a hot shower helps for about an hour.    She would like an injection today if possible.  She rates her pain as severe.  Additionally of note she needs a change in her thyroid medication.  She has hypothyroidism.  She previously was taking levothyroxine 150 mcg daily.  Her TSH was mildly elevated around 4 and her PCP increase the levothyroxine to 175 mcg.  She notes this made her feel flushed and jittery and that poor.  She discontinued the higher dose and resumed her previous dose of 150 levothyroxine.  She is run out needs a refill.  She plans to schedule follow-up appointment with her PCP in about a month.  Pertinent review of systems: No fevers or chills  Relevant historical information: Sarcoidosis   Exam:  BP 136/84 (BP Location: Left Arm, Patient Position: Sitting, Cuff Size: Large)   Pulse 86   Ht 5\' 2"  (1.575 m)   SpO2 96%   BMI 38.45 kg/m  General: Well Developed, well nourished, and in no acute distress.   MSK: L-spine normal-appearing nontender midline.  Nontender paraspinal musculature.  Decreased lumbar motion. Right hip normal-appearing tender palpation at posterior aspect of iliac crest gluteus medius muscle origin area.  Mildly tender also at intertrochanter. Hip abduction and external rotation strength diminished 4+/5 with some  pain. Antalgic gait.    Lab and Radiology Results  Procedure: Real-time Ultrasound Guided Injection of right hip iliac crest gluteus medius muscle origin Device: Philips Affiniti 50G Images permanently stored and available for review in PACS Verbal informed consent obtained.  Discussed risks and benefits of procedure. Warned about infection bleeding damage to structures skin hypopigmentation and fat atrophy among others. Patient expresses understanding and agreement Time-out conducted.   Noted no overlying erythema, induration, or other signs of local infection.   Skin prepped in a sterile fashion.   Local anesthesia: Topical Ethyl chloride.   With sterile technique and under real time ultrasound guidance:  40 mg of Kenalog and 2 mL of Marcaine injected into gluteus medius muscle origin. Fluid seen entering the muscle.   Completed without difficulty   Pain partially resolved suggesting accurate placement of the medication.   Advised to call if fevers/chills, erythema, induration, drainage, or persistent bleeding.   Images permanently stored and available for review in the ultrasound unit.  Impression: Technically successful ultrasound guided injection.  Lab Results  Component Value Date   TSH 4.06 11/13/2019     Assessment and Plan: 64 y.o. female with right buttocks pain due to gluteus medius muscle strain or injury due to lunges.  Plan to treat with physical therapy and home exercise program.  We will proceed with injection as she is having quite a bit of pain and has done well with injections  in the past.  Hypothyroidism: Clearly intolerant of higher doses of levothyroxine.  TSH was mildly elevated at 4.06.  Reasonable to continue lower dose at 150 mcg daily.  I have prescribed 150 and recommend patient follow back up with her PCP in 4 to 6 weeks.  At that point TSH could be rechecked.  We will send copy of this note today to PCP.   PDMP not reviewed this encounter. Orders  Placed This Encounter  Procedures  . [place] LIMITED JOINT SPACE STRUCTURES LOW RIGHT(NO LINKED CHARGES)    Order Specific Question:   Reason for Exam (SYMPTOM  OR DIAGNOSIS REQUIRED)    Answer:   inj    Order Specific Question:   Preferred imaging location?    Answer:   [facility]  . Ambulatory referral to Physical Therapy    Referral Priority:   Routine    Referral Type:   Physical Medicine    Referral Reason:   Specialty Services Required    Requested Specialty:   Physical Therapy   Meds ordered this encounter  Medications  . levothyroxine (SYNTHROID) 150 MCG tablet    Sig: Take 1 tablet (150 mcg total) by mouth daily before breakfast.    Dispense:  90 tablet    Refill:  1     Discussed warning signs or symptoms. Please see discharge instructions. Patient expresses understanding.   The above documentation has been reviewed and is accurate and complete Lynne Leader, M.D.

## 2020-03-06 ENCOUNTER — Telehealth: Payer: Self-pay | Admitting: Nurse Practitioner

## 2020-03-06 NOTE — Telephone Encounter (Signed)
Patient called back and states she has had epigastric pain(worse with an empty stomach), increased gas, and diarrhea alternation with constipation since 02/28/20. She denies it being GERD symptoms. She has tried [employer] and gaviscon with no relief. She has an appt. With Ellouise Newer PA tomorrow, but states if hurts so bad and radiates to her back. Wants to know if she should go to the ED. Please advise.

## 2020-03-06 NOTE — Telephone Encounter (Signed)
It sounds like going to the emergency department is  the best course of action as they will be able to evaluate and treat the pain quickly compared to an office visit with [place].  Some of what I am hearing suggest she may be have a gallbladder problem, specifically the pain but not the bowel habit changes.  Since she lives in [place] going to the [facility] ER is probably the most efficient way for her to get seen

## 2020-03-06 NOTE — Telephone Encounter (Signed)
Pt is requesting a call back from a nurse, missed phone call

## 2020-03-06 NOTE — Telephone Encounter (Signed)
Pt is scheduled for appt tomorrow but states that her pain is severe and is wondering if she should go to the ED. Pls call her.

## 2020-03-06 NOTE — Telephone Encounter (Signed)
No answer. Voicemail is not set up. 

## 2020-03-07 ENCOUNTER — Ambulatory Visit (INDEPENDENT_AMBULATORY_CARE_PROVIDER_SITE_OTHER): Payer: Medicare Other | Admitting: Physician Assistant

## 2020-03-07 ENCOUNTER — Encounter: Payer: Self-pay | Admitting: Physician Assistant

## 2020-03-07 VITALS — BP 132/82 | HR 65 | Ht 62.0 in | Wt 196.0 lb

## 2020-03-07 DIAGNOSIS — K625 Hemorrhage of anus and rectum: Secondary | ICD-10-CM | POA: Diagnosis not present

## 2020-03-07 DIAGNOSIS — R1013 Epigastric pain: Secondary | ICD-10-CM | POA: Diagnosis not present

## 2020-03-07 DIAGNOSIS — Z1211 Encounter for screening for malignant neoplasm of colon: Secondary | ICD-10-CM

## 2020-03-07 NOTE — Patient Instructions (Signed)
If you are age 66 or older, your body mass index should be between 23-30. Your Body mass index is 35.85 kg/m. If this is out of the aforementioned range listed, please consider follow up with your Primary Care Provider.  If you are age 102 or younger, your body mass index should be between 19-25. Your Body mass index is 35.85 kg/m. If this is out of the aformentioned range listed, please consider follow up with your Primary Care Provider.   You have been scheduled for a colonoscopy. Please follow written instructions given to you at your visit today.  Please pick up your prep supplies at the pharmacy within the next 1-3 days. If you use inhalers (even only as needed), please bring them with you on the day of your procedure.  Thank you for choosing me and [facility].  Ellouise Newer , [place]

## 2020-03-07 NOTE — Progress Notes (Signed)
Chief Complaint: Abdominal pain  HPI:    Katrina [name] is a 64 year old female with a past medical history as listed below, known to Dr. Arelia Longest, who was referred to me by Marrian Salvage,* for a complaint of abdominal pain.      11/10/2012 EGD with eosinophilic esophagitis and a Schatzki's ring which was dilated.    03/02/2018 patient seen in clinic for follow-up of diarrhea and abdominal pain.  As noted she been seen in clinic 2 weeks prior and had taken a course of clindamycin for a few weeks.  C. difficile was positive and she is started on vancomycin.  At that time she was doing better but complaining of some left lateral abdominal pain.  It was discussed there seem to be a musculoskeletal component at that time but the patient that pain was aggravated by eating and sometimes better after a bowel movement.  It was discussed that she should have a CTAP with contrast given the persistent nature of the discomfort.  Her CRC screening was also discussed but she had never had screening due to cost.  She is FOBT negative in 2017.    03/10/2018 CT of the abdomen pelvis with contrast showed mild fatty infiltration of the liver and moderate stool burden.    03/06/2020 patient called clinic and describes she had terrible epigastric pain (worse with an empty stomach), increased gas and diarrhea alternation with constipation since 02/28/2020.  She did not think it was related to reflux.  Had tried Pepto-Bismol Gaviscon with no relief.  Dr. Arelia Longest described the going to the ER be the best course of action.  He thought that maybe she was having a gallbladder problem.    Today, the patient presents to clinic and tells me that she started a diet which consists of severely decreased calories and provided meals including shakes and soups etc. which have a lot of Stevia in them, about a month ago.  Over the past 2 weeks though she has been eating mostly the sweet stuff and ate one of the bars and started with severe  epigastric pain.  This was right at the top of her abdomen and seemed to radiate radiate up into her chest and down into her stomach slightly.  At first this would come and go on 02/28/2020, but continued throughout the weekend and got worse over the past few days to the point where it was constant.  She called our office yesterday and was asked if it felt better after eating.  She ate a half of a sandwich and the pain completely went away.  Currently she is only having a little bit of epigastric discomfort, but nothing like she was experiencing before.  Associated symptoms include radiation from diarrhea to constipation which has also returned to normal now.    Also describes a small amount of bright red blood on the toilet paper after wiping from some of her bowel movements over the past couple of months off and on.    Denies fever, chills, weight loss or symptoms that awaken her from sleep.  Past Medical History:  Diagnosis Date  . BCC (basal cell carcinoma of skin) Infundibulocystic 03/11/2008   Inner Left Eye  . Chest pain at rest 11/07/2012  . Diabetes mellitus without complication ([facility])   . Eosinophilic esophagitis 68/03/7516  . Esophageal stricture   . Exercise-induced asthma   . GERD (gastroesophageal reflux disease)   . Hypertension 09/27/2017  . Hypothyroidism   . Sarcoidosis   .  Shortness of breath 11/08/2012   RECENTLY  & WHEN i EXERCISE"  . Solar lentigo 03/11/2008   Back - Atypical  . Thyroid disease   . Upper airway cough syndrome     Past Surgical History:  Procedure Laterality Date  . ABDOMINAL HYSTERECTOMY     Partial  . ESOPHAGOGASTRODUODENOSCOPY (EGD) WITH ESOPHAGEAL DILATION N/A 11/10/2012   Procedure: ESOPHAGOGASTRODUODENOSCOPY (EGD) WITH ESOPHAGEAL DILATION;  Surgeon: Gatha Mayer, MD;  Location: [facility];  Service: Endoscopy;  Laterality: N/A;  [place] vs. Balloon  . VIDEO BRONCHOSCOPY WITH ENDOBRONCHIAL ULTRASOUND Bilateral 01/15/2015   Procedure: VIDEO  BRONCHOSCOPY WITH ENDOBRONCHIAL ULTRASOUND of  LEFT LUNG LOWER LOBE and right lung;  Surgeon: Collene Gobble, MD;  Location: [facility];  Service: Thoracic;  Laterality: Bilateral;    Current Outpatient Medications  Medication Sig Dispense Refill  . albuterol (PROVENTIL HFA;VENTOLIN HFA) 108 (90 Base) MCG/ACT inhaler Inhale 1-2 puffs into the lungs every 6 (six) hours as needed for wheezing or shortness of breath. 1 Inhaler 0  . aspirin EC 81 MG tablet Take 1 tablet (81 mg total) by mouth daily. (Patient taking differently: Take 81 mg by mouth as needed. ) 90 tablet 1  . aspirin-acetaminophen-caffeine (EXCEDRIN MIGRAINE) 250-250-65 MG tablet Take 1 tablet by mouth every 6 (six) hours as needed for headache.    . celecoxib (CELEBREX) 200 MG capsule Take 1 capsule (200 mg total) by mouth 2 (two) times daily as needed. One to 2 tablets by mouth daily as needed for pain. 60 capsule 2  . diphenhydrAMINE (BENADRYL) 25 MG tablet Take 1 tablet (25 mg total) by mouth every 8 (eight) hours as needed. 20 tablet 0  . DULoxetine (CYMBALTA) 30 MG capsule Take 1 capsule (30 mg total) by mouth daily. 90 capsule 0  . ibuprofen (ADVIL) 200 MG tablet Take 200 mg by mouth every 6 (six) hours as needed for headache or mild pain.    Marland Kitchen levothyroxine (SYNTHROID) 150 MCG tablet Take 1 tablet (150 mcg total) by mouth daily before breakfast. 90 tablet 1  . metoprolol succinate (TOPROL-XL) 50 MG 24 hr tablet Take 1 tablet (50 mg total) by mouth daily. Take with or immediately following a meal. 90 tablet 0  . omeprazole (PRILOSEC) 20 MG capsule Take 20 mg by mouth daily.    . polyvinyl alcohol (LIQUIFILM TEARS) 1.4 % ophthalmic solution Place 1 drop into both eyes as needed for dry eyes (itching/allergies).    Marland Kitchen PRED FORTE 1 % ophthalmic suspension 1 drop 2 (two) times daily.    . predniSONE (DELTASONE) 20 MG tablet Take 1 tablet (20 mg total) by mouth daily with breakfast. 5 tablet 0  . saccharomyces boulardii (FLORASTOR) 250 MG  capsule Take 1 capsule (250 mg total) by mouth 2 (two) times daily. (Patient taking differently: Take 250 mg by mouth daily. ) 60 capsule 0  . spironolactone (ALDACTONE) 25 MG tablet Take 25 mg by mouth daily.     No current facility-administered medications for this visit.    Allergies as of 03/07/2020 - Review Complete 01/18/2020  Allergen Reaction Noted  . Penicillins Shortness Of Breath and Rash 12/28/2007  . Amlodipine  10/06/2018  . Lisinopril  11/13/2019  . Losartan  11/13/2019  . Metformin and related  10/06/2018  . Bactrim ds [sulfamethoxazole-trimethoprim] Rash 04/22/2017  . Metformin Palpitations 10/06/2018    Family History  Problem Relation Age of Onset  . Diabetes Mother   . Stroke Mother   . Coronary artery disease  Father   . Coronary artery disease Brother   . Hypothyroidism Maternal Grandmother   . Diabetes Maternal Grandfather   . Heart disease Paternal Grandmother   . Heart disease Paternal Grandfather     Social History   Socioeconomic History  . Marital status: Divorced    Spouse name: Not on file  . Number of children: 3  . Years of education: 72  . Highest education level: Not on file  Occupational History  . Occupation: Unemployed  Tobacco Use  . Smoking status: Never Smoker  . Smokeless tobacco: Never Used  Vaping Use  . Vaping Use: Never used  Substance and Sexual Activity  . Alcohol use: No  . Drug use: No  . Sexual activity: Not on file  Other Topics Concern  . Not on file  Social History Narrative   Fun: Anything and everything   Social Determinants of Health   Financial Resource Strain:   . Difficulty of Paying Living Expenses: Not on file  Food Insecurity:   . Worried About Charity fundraiser in the Last Year: Not on file  . Ran Out of Food in the Last Year: Not on file  Transportation Needs:   . Lack of Transportation (Medical): Not on file  . Lack of Transportation (Non-Medical): Not on file  Physical Activity:   . Days  of Exercise per Week: Not on file  . Minutes of Exercise per Session: Not on file  Stress:   . Feeling of Stress : Not on file  Social Connections:   . Frequency of Communication with Friends and Family: Not on file  . Frequency of Social Gatherings with Friends and Family: Not on file  . Attends Religious Services: Not on file  . Active Member of Clubs or Organizations: Not on file  . Attends Archivist Meetings: Not on file  . Marital Status: Not on file  Intimate Partner Violence:   . Fear of Current or Ex-Partner: Not on file  . Emotionally Abused: Not on file  . Physically Abused: Not on file  . Sexually Abused: Not on file    Review of Systems:    Constitutional: No weight loss, fever or chills Skin: No rash  Cardiovascular: No chest pain Respiratory: No SOB  Gastrointestinal: See HPI and otherwise negative Genitourinary: No dysuria  Neurological: No headache, dizziness or syncope Musculoskeletal: No new muscle or joint pain Hematologic: No bruising Psychiatric: No history of depression or anxiety   Physical Exam:  Vital signs: BP 132/82 (BP Location: Left Arm, Patient Position: Sitting)   Pulse 65   Ht 5\' 2"  (1.575 m)   Wt 196 lb (88.9 kg)   SpO2 98%   BMI 35.85 kg/m   Constitutional:   Pleasant overweight Caucasian female appears to be in NAD, Well developed, Well nourished, alert and cooperative Respiratory: Respirations even and unlabored. Lungs clear to auscultation bilaterally.   No wheezes, crackles, or rhonchi.  Cardiovascular: Normal S1, S2. No MRG. Regular rate and rhythm. No peripheral edema, cyanosis or pallor.  Gastrointestinal:  Soft, nondistended, mild LLQ ttp, No rebound or guarding. Normal bowel sounds. No appreciable masses or hepatomegaly. Rectal: Patient Deferred.  Psychiatric: Demonstrates good judgement and reason without abnormal affect or behaviors.  RELEVANT LABS AND IMAGING: CBC    Component Value Date/Time   WBC 8.1  11/13/2019 1323   RBC 5.27 (H) 11/13/2019 1323   HGB 14.8 11/13/2019 1323   HCT 45.1 (H) 11/13/2019 1323   PLT 288  11/13/2019 1323   MCV 85.6 11/13/2019 1323   MCV 86.6 08/11/2011 1622   MCH 28.1 11/13/2019 1323   MCHC 32.8 11/13/2019 1323   RDW 13.8 11/13/2019 1323   LYMPHSABS 1,717 11/13/2019 1323   MONOABS 0.6 06/22/2019 1346   EOSABS 8 (L) 11/13/2019 1323   BASOSABS 57 11/13/2019 1323    CMP     Component Value Date/Time   NA 137 11/13/2019 1323   K 4.3 11/13/2019 1323   CL 99 11/13/2019 1323   CO2 25 11/13/2019 1323   GLUCOSE 155 (H) 11/13/2019 1323   BUN 12 11/13/2019 1323   CREATININE 0.73 11/13/2019 1323   CALCIUM 9.3 11/13/2019 1323   PROT 7.0 11/13/2019 1323   ALBUMIN 4.0 06/22/2019 1346   AST 20 11/13/2019 1323   ALT 24 11/13/2019 1323   ALKPHOS 72 06/22/2019 1346   BILITOT 0.6 11/13/2019 1323   GFRNONAA >60 10/17/2019 2121   GFRAA >60 10/17/2019 2121    Assessment: 1.  Epigastric pain: Seems related to a weight loss diet the patient was doing in certain foods that were included in this, pain has completely gone away over the past 24 hours; consider gastritis versus gallbladder etiology versus other 2.  Screening for colorectal cancer: Has never had a screening colonoscopy and is willing to proceed now 2.  Bright red blood per rectum: Occasionally sees on the toilet paper when wiping, deferred a rectal exam today and would like her colonoscopy scheduled; most likely hemorrhoids  Plan: 1.  The patient seems almost completely better over the past 24 hours.  Discussed that all of this was likely related to her drastic change in diet.  She would like to wait and see how she does, if the pain does not completely go away or if she has an increase in symptoms over the next week or 2 then would recommend an ultrasound of her abdomen as a first step for further evaluation, as well as CBC, CMP and Lipase. 2.  Did recommend the patient discontinue the diet. 3.  Scheduled  the patient for a screening colonoscopy, she has never had one.  This is scheduled with Dr. Carlean Purl in [facility] [place].  Patient was provided information regarding the risk for procedure and agrees to proceed.  She has had both of her Covid vaccines. 4.  Patient to follow in clinic per recommendations from Dr. Carlean Purl after time of procedure.  Ellouise Newer, PA-C [facility] 03/07/2020, 11:15 AM  Cc: Marrian Salvage,*

## 2020-03-11 ENCOUNTER — Other Ambulatory Visit: Payer: Self-pay

## 2020-03-11 ENCOUNTER — Telehealth: Payer: Self-pay | Admitting: Physician Assistant

## 2020-03-11 DIAGNOSIS — R140 Abdominal distension (gaseous): Secondary | ICD-10-CM

## 2020-03-11 DIAGNOSIS — R1013 Epigastric pain: Secondary | ICD-10-CM

## 2020-03-11 NOTE — Telephone Encounter (Signed)
Ambulatory referral added for add on EGD

## 2020-03-11 NOTE — Telephone Encounter (Signed)
See alternate notes dated 11/23

## 2020-03-11 NOTE — Telephone Encounter (Signed)
The pt has been rescheduled for endo colon and reinstructed.  She will call with any further concerns or questions.

## 2020-03-11 NOTE — Telephone Encounter (Signed)
I am okay for her to add on an EGD but this may mean that her procedure time is changed.  Please let her know and see if she still wants to proceed.  Thanks-JLL

## 2020-03-11 NOTE — Telephone Encounter (Signed)
The pt has bloating and abd discomfort that affects her breathing when she lays down at night.  She has an appt coming up for a colon and wants to know if she can add an EGD to the appt.  She is taking prilosec 20 mg daily.  She has been advised to start Pepcid at bedtime.  Of note, she did eat a raw salad before bed when the bloating and discomfort began.  She did not have these symptoms when she was seen on 11/19.  Please advise

## 2020-03-19 DIAGNOSIS — Z860100 Personal history of colon polyps, unspecified: Secondary | ICD-10-CM

## 2020-03-19 HISTORY — DX: Personal history of colonic polyps: Z86.010

## 2020-03-19 HISTORY — DX: Personal history of colon polyps, unspecified: Z86.0100

## 2020-03-24 ENCOUNTER — Other Ambulatory Visit: Payer: Medicare Other

## 2020-03-24 DIAGNOSIS — Z20822 Contact with and (suspected) exposure to covid-19: Secondary | ICD-10-CM

## 2020-03-26 ENCOUNTER — Telehealth: Payer: Self-pay | Admitting: General Practice

## 2020-03-26 LAB — NOVEL CORONAVIRUS, NAA: SARS-CoV-2, NAA: NOT DETECTED

## 2020-03-26 LAB — SARS-COV-2, NAA 2 DAY TAT

## 2020-03-26 NOTE — Telephone Encounter (Signed)
Negative COVID results given. Patient results "NOT Detected." Caller expressed understanding. ° °

## 2020-03-28 ENCOUNTER — Encounter: Payer: Medicare Other | Admitting: Internal Medicine

## 2020-03-29 ENCOUNTER — Ambulatory Visit: Payer: Medicare Other | Attending: Internal Medicine

## 2020-03-29 DIAGNOSIS — Z23 Encounter for immunization: Secondary | ICD-10-CM

## 2020-03-29 NOTE — Progress Notes (Signed)
   Covid-19 Vaccination Clinic  Name:  Katrina [name]    MRN: [id number] DOB: [birth year]  03/29/2020  [name] was observed post Covid-19 immunization for 15 minutes without incident. She was provided with Vaccine Information Sheet and instruction to access the V-Safe system.   [name] was instructed to call 911 with any severe reactions post vaccine: Marland Kitchen Difficulty breathing  . Swelling of face and throat  . A fast heartbeat  . A bad rash all over body  . Dizziness and weakness   Immunizations Administered    No immunizations on file.

## 2020-04-02 ENCOUNTER — Ambulatory Visit (AMBULATORY_SURGERY_CENTER): Payer: Medicare Other | Admitting: Internal Medicine

## 2020-04-02 ENCOUNTER — Encounter: Payer: Self-pay | Admitting: Internal Medicine

## 2020-04-02 ENCOUNTER — Other Ambulatory Visit: Payer: Self-pay

## 2020-04-02 VITALS — BP 108/68 | HR 58 | Temp 97.8°F | Resp 20 | Ht 62.0 in | Wt 196.0 lb

## 2020-04-02 DIAGNOSIS — D124 Benign neoplasm of descending colon: Secondary | ICD-10-CM

## 2020-04-02 DIAGNOSIS — D120 Benign neoplasm of cecum: Secondary | ICD-10-CM

## 2020-04-02 DIAGNOSIS — D125 Benign neoplasm of sigmoid colon: Secondary | ICD-10-CM | POA: Diagnosis not present

## 2020-04-02 DIAGNOSIS — Z1211 Encounter for screening for malignant neoplasm of colon: Secondary | ICD-10-CM | POA: Diagnosis not present

## 2020-04-02 DIAGNOSIS — R1013 Epigastric pain: Secondary | ICD-10-CM

## 2020-04-02 DIAGNOSIS — R140 Abdominal distension (gaseous): Secondary | ICD-10-CM

## 2020-04-02 MED ORDER — SODIUM CHLORIDE 0.9 % IV SOLN: 500.0000 mL | Freq: Once | INTRAVENOUS | Status: DC

## 2020-04-02 NOTE — Op Note (Signed)
[facility] Patient Name: Katrina [name] Procedure Date: 04/02/2020 3:08 PM MRN: [id number] Endoscopist: Gatha Mayer , MD Age: 64 Referring MD:  Date of Birth: Feb 08, 1956 Gender: Female Account #: [id number] Procedure:                Colonoscopy Indications:              Screening for colorectal malignant neoplasm Medicines:                Propofol per Anesthesia, Monitored Anesthesia Care Procedure:                Pre-Anesthesia Assessment:                           - Prior to the procedure, a History and Physical                            was performed, and patient medications and                            allergies were reviewed. The patient's tolerance of                            previous anesthesia was also reviewed. The risks                            and benefits of the procedure and the sedation                            options and risks were discussed with the patient.                            All questions were answered, and informed consent                            was obtained. Prior Anticoagulants: The patient has                            taken no previous anticoagulant or antiplatelet                            agents. ASA Grade Assessment: II - A patient with                            mild systemic disease. After reviewing the risks                            and benefits, the patient was deemed in                            satisfactory condition to undergo the procedure.                           After obtaining informed consent, the colonoscope  was passed under direct vision. Throughout the                            procedure, the patient's blood pressure, pulse, and                            oxygen saturations were monitored continuously. The                            Olympus [id number]-H190DL [id number]) Colonoscope was                            introduced through the anus and advanced to the the                             cecum, identified by appendiceal orifice and                            ileocecal valve. The colonoscopy was performed                            without difficulty. The patient tolerated the                            procedure well. The quality of the bowel                            preparation was good. The ileocecal valve,                            appendiceal orifice, and rectum were photographed.                            The bowel preparation used was Miralax via split                            dose instruction. Scope In: 3:27:43 PM Scope Out: 3:45:22 PM Scope Withdrawal Time: 0 hours 14 minutes 47 seconds  Total Procedure Duration: 0 hours 17 minutes 39 seconds  Findings:                 The perianal and digital rectal examinations were                            normal.                           A diminutive polyp was found in the sigmoid colon.                            The polyp was sessile. The polyp was removed with a                            cold snare. Resection and retrieval were complete.  Verification of patient identification for the                            specimen was done. Estimated blood loss was minimal.                           Two sessile polyps were found in the descending                            colon and cecum. The polyps were 1 mm in size.                            These polyps were removed with a cold biopsy                            forceps. Resection and retrieval were complete.                            Verification of patient identification for the                            specimen was done. Estimated blood loss was minimal.                           Internal hemorrhoids were found.                           The exam was otherwise without abnormality on                            direct and retroflexion views. Complications:            No immediate complications. Estimated Blood Loss:     Estimated  blood loss was minimal. Impression:               - One diminutive polyp in the sigmoid colon,                            removed with a cold snare. Resected and retrieved.                           - Two 1 mm polyps in the descending colon and in                            the cecum, removed with a cold biopsy forceps.                            Resected and retrieved.                           - Internal hemorrhoids.                           - The examination was otherwise normal on direct  and retroflexion views. Recommendation:           - Patient has a contact number available for                            emergencies. The signs and symptoms of potential                            delayed complications were discussed with the                            patient. Return to normal activities tomorrow.                            Written discharge instructions were provided to the                            patient.                           - Continue present medications.                           - Repeat colonoscopy is recommended. The                            colonoscopy date will be determined after pathology                            results from today's exam become available for                            review.                           - Stop [employer] [place] Gatha Mayer, MD 04/02/2020 4:03:17 PM This report has been signed electronically.

## 2020-04-02 NOTE — Op Note (Signed)
[facility] Patient Name: Katrina [name] Procedure Date: 04/02/2020 3:09 PM MRN: [id number] Endoscopist: Gatha Mayer , MD Age: 64 Referring MD:  Date of Birth: September 18, 1955 Gender: Female Account #: [id number] Procedure:                Upper GI endoscopy Indications:              Epigastric abdominal pain Medicines:                Propofol per Anesthesia, Monitored Anesthesia Care Procedure:                Pre-Anesthesia Assessment:                           - Prior to the procedure, a History and Physical                            was performed, and patient medications and                            allergies were reviewed. The patient's tolerance of                            previous anesthesia was also reviewed. The risks                            and benefits of the procedure and the sedation                            options and risks were discussed with the patient.                            All questions were answered, and informed consent                            was obtained. Prior Anticoagulants: The patient has                            taken no previous anticoagulant or antiplatelet                            agents. ASA Grade Assessment: II - A patient with                            mild systemic disease. After reviewing the risks                            and benefits, the patient was deemed in                            satisfactory condition to undergo the procedure.                           After obtaining informed consent, the endoscope was  passed under direct vision. Throughout the                            procedure, the patient's blood pressure, pulse, and                            oxygen saturations were monitored continuously. The                            Endoscope was introduced through the mouth, and                            advanced to the second part of duodenum. The upper                            GI  endoscopy was accomplished without difficulty.                            The patient tolerated the procedure well. Scope In: Scope Out: Findings:                 The esophagus was normal.                           The stomach was normal.                           The examined duodenum was normal.                           The cardia and gastric fundus were normal on                            retroflexion. Complications:            No immediate complications. Estimated Blood Loss:     Estimated blood loss: none. Impression:               - Normal esophagus.                           - Normal stomach.                           - Normal examined duodenum.                           - No specimens collected. Recommendation:           - Patient has a contact number available for                            emergencies. The signs and symptoms of potential                            delayed complications were discussed with the  patient. Return to normal activities tomorrow.                            Written discharge instructions were provided to the                            patient.                           - Continue present medications.                           - See the other procedure note for documentation of                            additional recommendations.                           - Do not start Optivia weight loss again                           MY OFFICE WILL SCHEDULE [facility] [place] REGARDING EPIGASTRIC                            PAIN ? GALLSTONES                           I HAVE RECOMMENDED WEIGHT LOSS USING LOW CARB ABD                            RESTRICTED FEEDING/INTERMITTENT FASTING SEE AVS Gatha Mayer, MD 04/02/2020 3:59:23 PM This report has been signed electronically.

## 2020-04-02 NOTE — Patient Instructions (Addendum)
Esophagus stomach and upper intestine look normal to me.  I think you might have gallstones and we will schedule an ultrasound to look for that.  My office will set that up and we will contact you.  I found and removed 3 tiny colon polyps.  They all look benign and they are very tiny, we will see what they were and what that means as far as when to repeat a colonoscopy.  Good for you for trying to lose weight.  As we discussed prior to the procedure I think there is a better way to try.  Lower carbohydrate food intake and a process of restricted feeding and intermittent fasting has been shown to work well.  A great book is The Obesity Code by Dr. Sharman Cheek.  I highly recommended.  It is a little bit dry but if you can get through that part and get to the end there are some great ideas on how to change how you eat.  Dietcoctor.com is a wonderful website to guide you to do lower carb and restricted feeding time and intermittent fasting.  You can also look up Dr. Enrigue Catena who is at [facility].  He has a website that is very helpful.  The problem for many people is too much insulin and I think that is most likely your case and that you have become bigger and require more insulin and have insulin resistance.  The whole key is to lower insulin levels in your body and that will help you lose weight.  The book will explain that in the diet doctor website also helps with that.  I recommend that you try the 30-day free trial for the whole website and consider paying some money to subscribe its not a lot and its for your health.  They do not push supplements or things like that.  There are some wonderful success stories and people can reverse diabetes and greatly improve their health.  Be patient and give yourself the time to do this.  Once I see the ultrasound result we will contact you.  I appreciate the opportunity to care for you.  Silvano Rusk, MD, [facility]  Please, read all of the handouts given to you  by your recovery room nurse.  YOU HAD AN ENDOSCOPIC PROCEDURE TODAY AT [facility]:   Refer to the procedure report that was given to you for any specific questions about what was found during the examination.  If the procedure report does not answer your questions, please call your gastroenterologist to clarify.  If you requested that your care partner not be given the details of your procedure findings, then the procedure report has been included in a sealed envelope for you to review at your convenience later.  YOU SHOULD EXPECT: Some feelings of bloating in the abdomen. Passage of more gas than usual.  Walking can help get rid of the air that was put into your GI tract during the procedure and reduce the bloating. If you had a lower endoscopy (such as a colonoscopy or flexible sigmoidoscopy) you may notice spotting of blood in your stool or on the toilet paper. If you underwent a bowel prep for your procedure, you may not have a normal bowel movement for a few days.  Please Note:  You might notice some irritation and congestion in your nose or some drainage.  This is from the oxygen used during your procedure.  There is no need for concern and it should clear  up in a day or so.  SYMPTOMS TO REPORT IMMEDIATELY:   Following lower endoscopy (colonoscopy or flexible sigmoidoscopy):  Excessive amounts of blood in the stool  Significant tenderness or worsening of abdominal pains  Swelling of the abdomen that is new, acute  Fever of 100F or higher   Following upper endoscopy (EGD)  Vomiting of blood or coffee ground material  New chest pain or pain under the shoulder blades  Painful or persistently difficult swallowing  New shortness of breath  Fever of 100F or higher  Black, tarry-looking stools  For urgent or emergent issues, a gastroenterologist can be reached at any hour by calling [phone]. Do not use MyChart messaging for urgent concerns.    DIET:  We do  recommend a small meal at first, but then you may proceed to your regular diet.  Drink plenty of fluids but you should avoid alcoholic beverages for 24 hours.  ACTIVITY:  You should plan to take it easy for the rest of today and you should NOT DRIVE or use heavy machinery until tomorrow (because of the sedation medicines used during the test).    FOLLOW UP: Our staff will call the number listed on your records 48-72 hours following your procedure to check on you and address any questions or concerns that you may have regarding the information given to you following your procedure. If we do not reach you, we will leave a message.  We will attempt to reach you two times.  During this call, we will ask if you have developed any symptoms of COVID 19. If you develop any symptoms (ie: fever, flu-like symptoms, shortness of breath, cough etc.) before then, please call [phone].  If you test positive for Covid 19 in the 2 weeks post procedure, please call and report this information to [place].    If any biopsies were taken you will be contacted by phone or by letter within the next 1-3 weeks.  Please call us at [phone] if you have not heard about the biopsies in 3 weeks.    SIGNATURES/CONFIDENTIALITY: You and/or your care partner have signed paperwork which will be entered into your electronic medical record.  These signatures attest to the fact that that the information above on your After Visit Summary has been reviewed and is understood.  Full responsibility of the confidentiality of this discharge information lies with you and/or your care-partner.

## 2020-04-02 NOTE — Progress Notes (Signed)
A/ox3, pleased with MAC, report to RN 

## 2020-04-02 NOTE — Progress Notes (Signed)
Pt's states no medical or surgical changes since previsit or office visit. 

## 2020-04-04 ENCOUNTER — Telehealth: Payer: Self-pay

## 2020-04-04 NOTE — Telephone Encounter (Signed)
  Follow up Call-  Call back number 04/02/2020  Post procedure Call Back phone  # [phone]  Permission to leave phone message Yes  Some recent data might be hidden     Patient questions:  Do you have a fever, pain , or abdominal swelling? No. Pain Score  0 *  Have you tolerated food without any problems? Yes.    Have you been able to return to your normal activities? Yes.    Do you have any questions about your discharge instructions: Diet   No. Medications  No. Follow up visit  No.  Do you have questions or concerns about your Care? No.  Actions: * If pain score is 4 or above: No action needed, pain <4.  1. Have you developed a fever since your procedure? no  2.   Have you had an respiratory symptoms (SOB or cough) since your procedure? no  3.   Have you tested positive for COVID 19 since your procedure no  4.   Have you had any family members/close contacts diagnosed with the COVID 19 since your procedure?  no   If yes to any of these questions please route to Joylene John, RN and Joella Prince, RN

## 2020-04-07 ENCOUNTER — Other Ambulatory Visit: Payer: Self-pay

## 2020-04-07 DIAGNOSIS — R1013 Epigastric pain: Secondary | ICD-10-CM

## 2020-04-07 NOTE — Progress Notes (Signed)
Patient notified of RUQ [place] scheduled for 04/15/20 10:10 arrival for 10:30 at [facility]. She is aware she will need to be NPO after midnight.

## 2020-04-10 ENCOUNTER — Encounter: Payer: Self-pay | Admitting: Internal Medicine

## 2020-04-14 ENCOUNTER — Ambulatory Visit (HOSPITAL_COMMUNITY): Payer: Medicare Other

## 2020-04-15 ENCOUNTER — Ambulatory Visit (HOSPITAL_COMMUNITY)
Admission: RE | Admit: 2020-04-15 | Discharge: 2020-04-15 | Disposition: A | Discharge status: Home / Self Care | Payer: Medicare Other | Source: Ambulatory Visit | Attending: Internal Medicine | Admitting: Internal Medicine

## 2020-04-15 ENCOUNTER — Other Ambulatory Visit: Payer: Self-pay

## 2020-04-15 DIAGNOSIS — R1013 Epigastric pain: Secondary | ICD-10-CM | POA: Diagnosis not present

## 2020-04-26 ENCOUNTER — Other Ambulatory Visit: Payer: Medicare Other

## 2020-04-26 DIAGNOSIS — Z20822 Contact with and (suspected) exposure to covid-19: Secondary | ICD-10-CM

## 2020-04-29 LAB — NOVEL CORONAVIRUS, NAA: SARS-CoV-2, NAA: NOT DETECTED

## 2020-05-01 ENCOUNTER — Telehealth: Payer: Self-pay | Admitting: General Practice

## 2020-05-01 NOTE — Telephone Encounter (Signed)
Patient called to get COVID results. Made her aware they were negative.

## 2020-05-19 ENCOUNTER — Ambulatory Visit (INDEPENDENT_AMBULATORY_CARE_PROVIDER_SITE_OTHER): Payer: Medicare Other | Admitting: Family

## 2020-05-19 ENCOUNTER — Other Ambulatory Visit: Payer: Self-pay

## 2020-05-19 ENCOUNTER — Encounter: Payer: Self-pay | Admitting: Family

## 2020-05-19 VITALS — BP 142/84 | HR 67 | Temp 98.2°F | Ht 62.0 in | Wt 202.0 lb

## 2020-05-19 DIAGNOSIS — E118 Type 2 diabetes mellitus with unspecified complications: Secondary | ICD-10-CM

## 2020-05-19 DIAGNOSIS — R350 Frequency of micturition: Secondary | ICD-10-CM | POA: Diagnosis not present

## 2020-05-19 DIAGNOSIS — R200 Anesthesia of skin: Secondary | ICD-10-CM | POA: Diagnosis not present

## 2020-05-19 DIAGNOSIS — E038 Other specified hypothyroidism: Secondary | ICD-10-CM | POA: Diagnosis not present

## 2020-05-19 LAB — COMPREHENSIVE METABOLIC PANEL
ALT: 18 U/L (ref 0–35)
AST: 15 U/L (ref 0–37)
Albumin: 4.1 g/dL (ref 3.5–5.2)
Alkaline Phosphatase: 77 U/L (ref 39–117)
BUN: 16 mg/dL (ref 6–23)
CO2: 29 mEq/L (ref 19–32)
Calcium: 9.7 mg/dL (ref 8.4–10.5)
Chloride: 101 mEq/L (ref 96–112)
Creatinine, Ser: 0.68 mg/dL (ref 0.40–1.20)
GFR: 90.9 mL/min (ref >60.00)
Glucose, Bld: 137 mg/dL — ABNORMAL HIGH (ref 70–99)
Potassium: 4.3 mEq/L (ref 3.5–5.1)
Sodium: 137 mEq/L (ref 135–145)
Total Bilirubin: 0.5 mg/dL (ref 0.2–1.2)
Total Protein: 7.4 g/dL (ref 6.0–8.3)

## 2020-05-19 LAB — CBC WITH DIFFERENTIAL/PLATELET
Basophils Absolute: 0.1 10*3/uL (ref 0.0–0.1)
Basophils Relative: 0.9 % (ref 0.0–3.0)
Eosinophils Absolute: 0.2 10*3/uL (ref 0.0–0.7)
Eosinophils Relative: 2.6 % (ref 0.0–5.0)
HCT: 42.8 % (ref 36.0–46.0)
Hemoglobin: 14.6 g/dL (ref 12.0–15.0)
Lymphocytes Relative: 25 % (ref 12.0–46.0)
Lymphs Abs: 1.8 10*3/uL (ref 0.7–4.0)
MCHC: 34 g/dL (ref 30.0–36.0)
MCV: 83.3 fl (ref 78.0–100.0)
Monocytes Absolute: 0.5 10*3/uL (ref 0.1–1.0)
Monocytes Relative: 6.4 % (ref 3.0–12.0)
Neutro Abs: 4.7 10*3/uL (ref 1.4–7.7)
Neutrophils Relative %: 65.1 % (ref 43.0–77.0)
Platelets: 266 10*3/uL (ref 150.0–400.0)
RBC: 5.14 Mil/uL — ABNORMAL HIGH (ref 3.87–5.11)
RDW: 13.8 % (ref 11.5–15.5)
WBC: 7.2 10*3/uL (ref 4.0–10.5)

## 2020-05-19 LAB — HEMOGLOBIN A1C: Hgb A1c MFr Bld: 6.7 % — ABNORMAL HIGH (ref 4.6–6.5)

## 2020-05-19 LAB — VITAMIN B12: Vitamin B-12: 309 pg/mL (ref 211–911)

## 2020-05-19 LAB — TSH: TSH: 8.03 u[IU]/mL — ABNORMAL HIGH (ref 0.35–4.50)

## 2020-05-19 MED ORDER — LEVOTHYROXINE SODIUM 150 MCG PO TABS: 150.0000 ug | ORAL_TABLET | Freq: Every day | ORAL | 1 refills | Status: DC

## 2020-05-19 MED ORDER — ALBUTEROL SULFATE HFA 108 (90 BASE) MCG/ACT IN AERS: 1.0000 | INHALATION_SPRAY | Freq: Four times a day (QID) | RESPIRATORY_TRACT | 0 refills | Status: DC | PRN

## 2020-05-19 NOTE — Progress Notes (Signed)
Katrina [name] is a 65 y.o. female with the following history as recorded in EpicCare:  Patient Active Problem List   Diagnosis Date Noted  . Hx of colonic polyps 03/2020  . Piriformis syndrome of right side 10/04/2018  . Abnormal electrocardiogram (ECG) (EKG) 08/22/2018  . Prediabetes 08/22/2018  . Hyperlipidemia with target LDL less than 130 08/22/2018  . Type II diabetes mellitus with manifestations ([place]) 08/22/2018  . Esophageal stricture   . Obesity (BMI 30.0-34.9)   . Lobar pneumonia ([place])   . Hypertension 09/27/2017  . 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 Low back pain associated with a spinal disorder other than radiculopathy or spinal stenosis 03/01/2016  . Myalgia 10/09/2015  . Upper airway cough syndrome 01/26/2015  . Obesity (BMI 30-39.9) 01/26/2015  . Pulmonary nodules   . DOE (dyspnea on exertion) 01/01/2015  . Pulmonary sarcoidosis ([place])   . Mediastinal lymphadenopathy 12/26/2014  . Esophageal ring, acquired 11/10/2012  . Dysphagia, unspecified(787.20) 11/09/2012  . Hypothyroidism 11/08/2012  . 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 Hypothyroidism, acquired 12/28/2007  . GERD 12/28/2007  . 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 aspirin EC 81 MG tablet Take 1 tablet (81 mg total) by mouth daily. 90 tablet 1  . aspirin-acetaminophen-caffeine (EXCEDRIN MIGRAINE) 250-250-65 MG tablet Take 1 tablet by mouth every 6 (six) hours as needed for headache.    . celecoxib (CELEBREX) 200 MG capsule Take 1 capsule (200 mg total) by mouth 2 (two) times daily as needed. One to 2 tablets by mouth daily as needed for pain. 60 capsule 2  . diphenhydrAMINE (BENADRYL) 25 MG tablet Take 1 tablet (25 mg total) by mouth every 8 (eight) hours as needed. 20 tablet 0  . DULoxetine (CYMBALTA) 30 MG capsule Take 1 capsule (30 mg total) by mouth daily. 90 capsule 0  . ibuprofen  (ADVIL) 200 MG tablet Take 200 mg by mouth every 6 (six) hours as needed for headache or mild pain.    Marland Kitchen losartan (COZAAR) 50 MG tablet Take 50 mg by mouth daily. Takes 68m daily    . omeprazole (PRILOSEC) 20 MG capsule Take 20 mg by mouth daily.    . polyvinyl alcohol (LIQUIFILM TEARS) 1.4 % ophthalmic solution Place 1 drop into both eyes as needed for dry eyes (itching/allergies).    .Marland KitchenPRED FORTE 1 % ophthalmic suspension 1 drop 2 (two) times daily.    .Marland Kitchenspironolactone (ALDACTONE) 25 MG tablet Take 25 mg by mouth daily.    .Marland Kitchenalbuterol (VENTOLIN HFA) 108 (90 Base) MCG/ACT inhaler Inhale 1-2 puffs into the lungs every 6 (six) hours as needed for wheezing or shortness of breath. 1 each 0  . levothyroxine (SYNTHROID) 150 MCG tablet Take 1 tablet (150 mcg total) by mouth daily before breakfast. 90 tablet 1   No current facility-administered medications for this visit.    Allergies: Penicillins, Amlodipine, Lisinopril, Losartan, Metformin and related, Bactrim ds [sulfamethoxazole-trimethoprim], and Metformin  Past Medical History:  Diagnosis Date  . BCC (basal cell carcinoma of skin) Infundibulocystic 03/11/2008   Inner Left Eye  . Chest pain at rest 11/07/2012  . Diabetes mellitus without complication ([place]   . Eosinophilic esophagitis 010/62/6948 . Esophageal stricture   . Exercise-induced asthma   . GERD (gastroesophageal reflux disease)   . Hx of colonic polyps 03/2020  . Hypertension 09/27/2017  . Hypothyroidism   . Sarcoidosis   .  Shortness of breath 11/08/2012   RECENTLY  & WHEN i EXERCISE"  . Solar lentigo 03/11/2008   Back - Atypical  . Thyroid disease   . Upper airway cough syndrome     Past Surgical History:  Procedure Laterality Date  . ABDOMINAL HYSTERECTOMY     Partial  . ESOPHAGOGASTRODUODENOSCOPY (EGD) WITH ESOPHAGEAL DILATION N/A 11/10/2012   Procedure: ESOPHAGOGASTRODUODENOSCOPY (EGD) WITH ESOPHAGEAL DILATION;  Surgeon: Gatha Mayer, MD;  Location: [facility];   Service: Endoscopy;  Laterality: N/A;  [place] vs. Balloon  . VIDEO BRONCHOSCOPY WITH ENDOBRONCHIAL ULTRASOUND Bilateral 01/15/2015   Procedure: VIDEO BRONCHOSCOPY WITH ENDOBRONCHIAL ULTRASOUND of  LEFT LUNG LOWER LOBE and right lung;  Surgeon: Collene Gobble, MD;  Location: [facility];  Service: Thoracic;  Laterality: Bilateral;    Family History  Problem Relation Age of Onset  . Diabetes Mother   . Stroke Mother   . Coronary artery disease Father   . Coronary artery disease Brother   . Hypothyroidism Maternal Grandmother   . Diabetes Maternal Grandfather   . Heart disease Paternal Grandmother   . Heart disease Paternal Grandfather     Social History   Tobacco Use  . Smoking status: Never Smoker  . Smokeless tobacco: Never Used  Substance Use Topics  . Alcohol use: No    Subjective:  Would like to get checked for possible UTI; has been having increased urinary frequency "on and off" since her colonoscopy in December 2021; notes that the urinary symptoms do seem better recently and now more localized in her low back;   Also mentions that right side of her face has felt "numb" for the past month; notes that symptoms are localized only in her right cheek; no other symptoms; overdue to see her dentist; denies any vision changes or speech changed; no symptoms in her upper extremities;    Objective:  Vitals:   05/19/20 0919  BP: (!) 142/84  Pulse: 67  Temp: 98.2 F (36.8 C)  TempSrc: Oral  SpO2: 95%  Weight: 202 lb (91.6 kg)  Height: _0  (1.575 m)    General: Well developed, well nourished, in no acute distress  Skin : Warm and dry.  Head: Normocephalic and atraumatic  Eyes: Sclera and conjunctiva clear; pupils round and reactive to light; extraocular movements intact  Ears: External normal; canals clear; tympanic membranes normal  Oropharynx: Pink, supple. No suspicious lesions  Neck: Supple without thyromegaly, adenopathy  Lungs: Respirations unlabored; clear to auscultation  bilaterally without wheeze, rales, rhonchi  CVS exam: normal rate and regular rhythm.  Musculoskeletal: No deformities; no active joint inflammation  Extremities: No edema, cyanosis, clubbing  Vessels: Symmetric bilaterally  Neurologic: Alert and oriented; speech intact; face symmetrical; moves all extremities well; CNII-XII intact without focal deficit    Assessment:  1. Urinary frequency   2. Type II diabetes mellitus with manifestations ([place])   3. Right facial numbness   4. Other specified hypothyroidism     Plan:  1. Check urine culture; 2. Update labs today; 3. Symptoms x 1 month- no acute changes today; Check head CT; will most likely need to see neurology;  4. Check TSH today;    No follow-ups on file.  Orders Placed This Encounter  Procedures  . Urine Culture    Standing Status:   Future    Number of Occurrences:   1    Standing Expiration Date:   05/19/2021  . CT Head Wo Contrast    Standing Status:  Future    Standing Expiration Date:   05/19/2021    Order Specific Question:   Preferred imaging location?    Answer:   [facility]  . Hemoglobin A1c    Standing Status:   Future    Number of Occurrences:   1    Standing Expiration Date:   05/19/2021  . B12    Standing Status:   Future    Number of Occurrences:   1    Standing Expiration Date:   05/19/2021  . TSH    Standing Status:   Future    Number of Occurrences:   1    Standing Expiration Date:   05/19/2021  . CBC with Differential/Platelet    Standing Status:   Future    Number of Occurrences:   1    Standing Expiration Date:   05/19/2021  . Comp Met (CMET)    Standing Status:   Future    Number of Occurrences:   1    Standing Expiration Date:   05/19/2021    Requested Prescriptions   Signed Prescriptions Disp Refills  . albuterol (VENTOLIN HFA) 108 (90 Base) MCG/ACT inhaler 1 each 0    Sig: Inhale 1-2 puffs into the lungs every 6 (six) hours as needed for wheezing or shortness of breath.  .  levothyroxine (SYNTHROID) 150 MCG tablet 90 tablet 1    Sig: Take 1 tablet (150 mcg total) by mouth daily before breakfast.

## 2020-05-20 LAB — URINE CULTURE

## 2020-05-21 ENCOUNTER — Other Ambulatory Visit: Payer: Self-pay | Admitting: Family

## 2020-05-21 MED ORDER — LEVOTHYROXINE SODIUM 175 MCG PO TABS: 175.0000 ug | ORAL_TABLET | Freq: Every day | ORAL | 0 refills | Status: DC

## 2020-05-21 MED ORDER — PREDNISONE 20 MG PO TABS: 20.0000 mg | ORAL_TABLET | Freq: Every day | ORAL | 0 refills | Status: DC

## 2020-05-26 ENCOUNTER — Telehealth: Payer: Self-pay | Admitting: Family

## 2020-05-26 NOTE — Telephone Encounter (Signed)
Made patient aware of note, stated she would still like to speak to someone to discuss the synthroid

## 2020-05-26 NOTE — Telephone Encounter (Signed)
Can you please give pt a call to discuss the dosage of SYNTHROID) 175 MCG tablet [[id number]]

## 2020-05-26 NOTE — Telephone Encounter (Signed)
Result note from 2/2 that pt did not see: You did not have a UTI. I will call in the prednisone as discussed. Please see Dr. Georgina Snell if you back continues to bother you.  Your Hgba1c looked better. No medication needed.  Your TSH was quite high. I am going to increase your dosage slightly and we need to re-check your labs in about 1 month.

## 2020-05-28 NOTE — Telephone Encounter (Signed)
Pt verb understanding of increase in Synthroid, but had questions if she still needed to do the CT; states last time her symptoms happened it was b/c her "thyroid was off".  Pt advised to continue with plan of PCP.   Pt also states she would like referral with cardiologist that was discussed at a previous visit; states the "referral order expired".

## 2020-05-29 NOTE — Telephone Encounter (Signed)
Pt states she attempted to call [facility] cardiology after Christmas b/c that was where she had a referral; [facility] stated she needed a new referral b/c that one had expired.  She was never seen at [facility].

## 2020-05-29 NOTE — Telephone Encounter (Signed)
She saw Dr. Nadyne Coombes in 01/2019 and is also established at [facility] cardiology with Dr. Debara Pickett. Since she is a patient at both of these clinics, she can call and make her own appointment since she is technically their patient.

## 2020-05-30 NOTE — Telephone Encounter (Signed)
Mychart message sent re: reason for referral.

## 2020-05-30 NOTE — Telephone Encounter (Signed)
What is the reason for the referral?

## 2020-06-02 ENCOUNTER — Other Ambulatory Visit: Payer: Self-pay

## 2020-06-02 ENCOUNTER — Telehealth (INDEPENDENT_AMBULATORY_CARE_PROVIDER_SITE_OTHER): Payer: Medicare Other | Admitting: Family

## 2020-06-02 DIAGNOSIS — R0600 Dyspnea, unspecified: Secondary | ICD-10-CM

## 2020-06-02 DIAGNOSIS — R197 Diarrhea, unspecified: Secondary | ICD-10-CM

## 2020-06-02 DIAGNOSIS — R0609 Other forms of dyspnea: Secondary | ICD-10-CM

## 2020-06-02 NOTE — Progress Notes (Signed)
DIEDRA SINOR is a 65 y.o. female with the following history as recorded in EpicCare:  Patient Active Problem List   Diagnosis Date Noted  . Hx of colonic polyps 03/2020  . Piriformis syndrome of right side 10/04/2018  . Abnormal electrocardiogram (ECG) (EKG) 08/22/2018  . Prediabetes 08/22/2018  . Hyperlipidemia with target LDL less than 130 08/22/2018  . Type II diabetes mellitus with manifestations ([place]) 08/22/2018  . Esophageal stricture   . Obesity (BMI 30.0-34.9)   . Lobar pneumonia ([place])   . Hypertension 09/27/2017  . 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 Low back pain associated with a spinal disorder other than radiculopathy or spinal stenosis 03/01/2016  . Myalgia 10/09/2015  . Upper airway cough syndrome 01/26/2015  . Obesity (BMI 30-39.9) 01/26/2015  . Pulmonary nodules   . DOE (dyspnea on exertion) 01/01/2015  . Pulmonary sarcoidosis ([place])   . Mediastinal lymphadenopathy 12/26/2014  . Esophageal ring, acquired 11/10/2012  . Dysphagia, unspecified(787.20) 11/09/2012  . Hypothyroidism 11/08/2012  . 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 Hypothyroidism, acquired 12/28/2007  . GERD 12/28/2007  . 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 albuterol (VENTOLIN HFA) 108 (90 Base) MCG/ACT inhaler Inhale 1-2 puffs into the lungs every 6 (six) hours as needed for wheezing or shortness of breath. 1 each 0  . aspirin EC 81 MG tablet Take 1 tablet (81 mg total) by mouth daily. 90 tablet 1  . aspirin-acetaminophen-caffeine (EXCEDRIN MIGRAINE) 250-250-65 MG tablet Take 1 tablet by mouth every 6 (six) hours as needed for headache.    . celecoxib (CELEBREX) 200 MG capsule Take 1 capsule (200 mg total) by mouth 2 (two) times daily as needed. One to 2 tablets by mouth daily as needed for pain. 60 capsule 2  . diphenhydrAMINE (BENADRYL) 25 MG tablet Take 1 tablet (25 mg total) by  mouth every 8 (eight) hours as needed. 20 tablet 0  . DULoxetine (CYMBALTA) 30 MG capsule Take 1 capsule (30 mg total) by mouth daily. 90 capsule 0  . ibuprofen (ADVIL) 200 MG tablet Take 200 mg by mouth every 6 (six) hours as needed for headache or mild pain.    Marland Kitchen levothyroxine (SYNTHROID) 175 MCG tablet Take 1 tablet (175 mcg total) by mouth daily before breakfast. 90 tablet 0  . losartan (COZAAR) 50 MG tablet Take 50 mg by mouth daily. Takes 25mg  daily    . omeprazole (PRILOSEC) 20 MG capsule Take 20 mg by mouth daily.    . polyvinyl alcohol (LIQUIFILM TEARS) 1.4 % ophthalmic solution Place 1 drop into both eyes as needed for dry eyes (itching/allergies).    Marland Kitchen PRED FORTE 1 % ophthalmic suspension 1 drop 2 (two) times daily.    . predniSONE (DELTASONE) 20 MG tablet Take 1 tablet (20 mg total) by mouth daily with breakfast. 5 tablet 0  . spironolactone (ALDACTONE) 25 MG tablet Take 25 mg by mouth daily.     No current facility-administered medications for this visit.    Allergies: Penicillins, Amlodipine, Lisinopril, Losartan, Metformin and related, Bactrim ds [sulfamethoxazole-trimethoprim], and Metformin  Past Medical History:  Diagnosis Date  . BCC (basal cell carcinoma of skin) Infundibulocystic 03/11/2008   Inner Left Eye  . Chest pain at rest 11/07/2012  . Diabetes mellitus without complication ([place])   . Eosinophilic esophagitis 49/44/9675  . Esophageal stricture   . Exercise-induced asthma   . GERD (gastroesophageal  reflux disease)   . Hx of colonic polyps 03/2020  . Hypertension 09/27/2017  . Hypothyroidism   . Sarcoidosis   . Shortness of breath 11/08/2012   RECENTLY  & WHEN i EXERCISE"  . Solar lentigo 03/11/2008   Back - Atypical  . Thyroid disease   . Upper airway cough syndrome     Past Surgical History:  Procedure Laterality Date  . ABDOMINAL HYSTERECTOMY     Partial  . ESOPHAGOGASTRODUODENOSCOPY (EGD) WITH ESOPHAGEAL DILATION N/A 11/10/2012   Procedure:  ESOPHAGOGASTRODUODENOSCOPY (EGD) WITH ESOPHAGEAL DILATION;  Surgeon: Gatha Mayer, MD;  Location: [facility];  Service: Endoscopy;  Laterality: N/A;  Mal[place] vs. Balloon  . VIDEO BRONCHOSCOPY WITH ENDOBRONCHIAL ULTRASOUND Bilateral 01/15/2015   Procedure: VIDEO BRONCHOSCOPY WITH ENDOBRONCHIAL ULTRASOUND of  LEFT LUNG LOWER LOBE and right lung;  Surgeon: Collene Gobble, MD;  Location[facility];  Service: Thoracic;  Laterality: Bilateral;    Family History  Problem Relation Age of Onset  . Diabetes Mother   . Stroke Mother   . Coronary artery disease Father   . Coronary artery disease Brother   . Hypothyroidism Maternal Grandmother   . Diabetes Maternal Grandfather   . Heart disease Paternal Grandmother   . Heart disease Paternal Grandfather     Social History   Tobacco Use  . Smoking status: Never Smoker  . Smokeless tobacco: Never Used  Substance Use Topics  . Alcohol use: No    Subjective:   I connected with Grayce Sessions on 06/02/20 at  3:00 PM EST by a telephone call and verified that I am speaking with the correct person using two identifiers.   I discussed the limitations of evaluation and management by telemedicine and the availability of in person appointments. The patient expressed understanding and agreed to proceed. Provider in office/ patient is at home; provider and patient are only 2 people on telephone call.   1) has been having diarrhea for the past 1 1/2 week; wants to make sure she does not have C. Diff as she is due to stay with her daughter/ newborn grandchild soon; no fever or abdominal pain; no nausea or vomiting; just had recent colonoscopy; has had C. Diff in the past; 2) needs to follow up with her cardiologist- has decided that now she does [facility]; in 2020, she asked to be referred outside of [facility]; notes that she often has problems with feeling short of breath when climbing stairs; same complaint for past 2 years- last cardiologist felt  de-conditioning but patient didn't follow up to complete recommended stress test;     Objective:  There were no vitals filed for this visit.  General: Well developed, well nourished, in no acute distress  Head: Normocephalic and atraumatic  Lungs: Respirations unlabored;  Neurologic: Alert and oriented; speech intact; face symmetrical;   Assessment:  1. Diarrhea, unspecified type   2. DOE (dyspnea on exertion)     Plan:  1. Order for GI pathogen panel; follow up to be determined; 2. Referral to cardiology- will put her back with Dr. Debara Pickett whom she has seen in the past; she understands and plans to complete the work up this time; may also need to refer to pulmonology if no cardiac source note; try to work on exercise as tolerated as well.  Time spent 20 minutes  No follow-ups on file.  Orders Placed This Encounter  Procedures  . Gastrointestinal Pathogen Panel PCR    Standing  Status:   Future    Standing Expiration Date:   06/02/2021  . Ambulatory referral to Cardiology    Referral Priority:   Routine    Referral Type:   Consultation    Referral Reason:   Specialty Services Required    Referred to Provider:   Pixie Casino, MD    Requested Specialty:   Cardiology    Number of Visits Requested:   1    Requested Prescriptions    No prescriptions requested or ordered in this encounter

## 2020-06-03 NOTE — Telephone Encounter (Signed)
Pt states this was addressed during her video visit with PCP on 06/02/20.  Has no ques/concerns at this time.

## 2020-06-04 ENCOUNTER — Other Ambulatory Visit: Payer: Medicare Other

## 2020-06-04 ENCOUNTER — Other Ambulatory Visit: Payer: Self-pay | Admitting: *Deleted

## 2020-06-04 DIAGNOSIS — R197 Diarrhea, unspecified: Secondary | ICD-10-CM

## 2020-06-08 LAB — GI PROFILE, STOOL, PCR

## 2020-06-11 ENCOUNTER — Ambulatory Visit
Admission: RE | Admit: 2020-06-11 | Discharge: 2020-06-11 | Disposition: A | Discharge status: Home / Self Care | Payer: Medicare Other | Source: Ambulatory Visit | Attending: Family | Admitting: Family

## 2020-06-11 ENCOUNTER — Other Ambulatory Visit: Payer: Self-pay | Admitting: Family

## 2020-06-11 DIAGNOSIS — R200 Anesthesia of skin: Secondary | ICD-10-CM

## 2020-06-12 ENCOUNTER — Other Ambulatory Visit: Payer: Self-pay | Admitting: Family

## 2020-06-12 DIAGNOSIS — Z1231 Encounter for screening mammogram for malignant neoplasm of breast: Secondary | ICD-10-CM

## 2020-06-18 ENCOUNTER — Telehealth: Payer: Self-pay | Admitting: Family

## 2020-06-18 ENCOUNTER — Other Ambulatory Visit: Payer: Self-pay | Admitting: Family

## 2020-06-18 MED ORDER — LOSARTAN POTASSIUM 50 MG PO TABS
50.0000 mg | ORAL_TABLET | Freq: Every day | ORAL | 0 refills | Status: DC
Start: 2020-06-18 — End: 2020-07-21

## 2020-06-18 NOTE — Telephone Encounter (Signed)
Pt states that in Nov PCP advised her to determine the lesser of the evils to figure out what side effects she could manage vs taking medication for hypertension.  States she had a full bottle left from when she was originally prescribed the medication & began taking it in Nov.  Since starting it again, her hypertension seems to be under control.  She states the the hip pain is manageable & she would like a new prescription for it.  Pt advised that the Losartan she was taking might have been expired however, she states it lowered her BP.

## 2020-06-18 NOTE — Telephone Encounter (Signed)
1.Medication Requested: losartan (COZAAR) 50 MG tablet    2. Pharmacy (Name, [facility]): [facility] [address] [place] [address]  3. On Med List: yes   4. Last Visit with PCP: 1.31.22  5. Next visit date with PCP: n/a    Agent: Please be advised that RX refills may take up to 3 business days. We ask that you follow-up with your pharmacy.

## 2020-06-18 NOTE — Telephone Encounter (Signed)
The last time we talked about her blood pressure medication, she told me she didn't want to take the Losartan and we actually have it listed as an allergy. In September, she told Dr. Georgina Snell that she was taking Spironolactone and was asked to follow up to discuss.  When or how was she put back on the medication?

## 2020-06-19 ENCOUNTER — Encounter: Payer: Self-pay | Admitting: General Practice

## 2020-06-26 DIAGNOSIS — Z20822 Contact with and (suspected) exposure to covid-19: Secondary | ICD-10-CM | POA: Diagnosis not present

## 2020-06-27 ENCOUNTER — Telehealth: Payer: Self-pay | Admitting: Family

## 2020-06-27 ENCOUNTER — Telehealth (INDEPENDENT_AMBULATORY_CARE_PROVIDER_SITE_OTHER): Payer: Medicare Other | Admitting: Family Medicine

## 2020-06-27 ENCOUNTER — Encounter: Payer: Self-pay | Admitting: Family Medicine

## 2020-06-27 DIAGNOSIS — R062 Wheezing: Secondary | ICD-10-CM | POA: Diagnosis not present

## 2020-06-27 DIAGNOSIS — Z7189 Other specified counseling: Secondary | ICD-10-CM | POA: Diagnosis not present

## 2020-06-27 DIAGNOSIS — J069 Acute upper respiratory infection, unspecified: Secondary | ICD-10-CM

## 2020-06-27 NOTE — Progress Notes (Signed)
Virtual Visit via Telephone Note  I connected with Grayce Sessions on 06/27/20 at  1:30 PM EST by telephone and verified that I am speaking with the correct person using two identifiers.   I discussed the limitations, risks, security and privacy concerns of performing an evaluation and management service by telephone and the availability of in person appointments. I also discussed with the patient that there may be a patient responsible charge related to this service. The patient expressed understanding and agreed to proceed.  Location patient: home Location provider: work or home office Participants present for the call: patient, provider Patient did not have a visit in the prior 7 days to address this/these issue(s).   History of Present Illness: Pt is a 65 yo female with pmh sig HTN, asthma, pulmonary sarcoidosis, GERD, DM, hypothyroidism, HLD who is followed by Jodi Mourning, FNP and seen for acute concern.   Pt developed ear ache, sore throat, head cold, rhinorrhea on Monday. Now with slightly productive cough and wheezing.  Pt had a COVID test yesterday am, awaiting results.  Pt's grandson has been sick, but was negative for COVID.  Pt notes subjective fever and chills yesterday.  Pt has taken advil congestion and sinus pain, robitussin, albuterol inhaler, and afrin.  Pt was out of town and recently returned.  Went to help her daughter with her new baby.      Observations/Objective: Patient sounds cheerful and well on the phone. I do not appreciate any SOB. Speech and thought processing are grossly intact. Patient reported vitals:  Assessment and Plan: Viral URI with cough -Covid test results pending -Pt vaccinated and booster with Pfizer COVID-19 vaccine and a Moderna booster -Continue supportive care including Flonase, OTC cough and cold meds. -Continue using albuterol inhaler as needed -Given precautions against using Afrin nasal spray for more than 3 days -Given strict  precautions for continued or worsening symptoms  Educated about COVID-19 virus infection -Discussed signs and symptoms of COVID-19 virus infection -Awaiting pending Covid test  Wheezing -Continue albuterol inhaler as needed  Follow Up Instructions: F/u as needed   [id number] 5-10 [id number] 11-20 9443 21-30 I did not refer this patient for an OV in the next 24 hours for this/these issue(s).  I discussed the assessment and treatment plan with the patient. The patient was provided an opportunity to ask questions and all were answered. The patient agreed with the plan and demonstrated an understanding of the instructions.   The patient was advised to call back or seek an in-person evaluation if the symptoms worsen or if the condition fails to improve as anticipated.  I provided 13 minutes of non-face-to-face time during this encounter.   Billie Ruddy, MD

## 2020-06-27 NOTE — Telephone Encounter (Signed)
Patient called to let Dr. Volanda Napoleon know that her COVID test was negative so she can call her in something to the pharmacy.  [id number] - [place], [place] - [address] AT [address] Phone:  [phone]  Fax:  [phone]

## 2020-06-28 ENCOUNTER — Telehealth (INDEPENDENT_AMBULATORY_CARE_PROVIDER_SITE_OTHER): Payer: Medicare Other | Admitting: Family Medicine

## 2020-06-28 DIAGNOSIS — J45901 Unspecified asthma with (acute) exacerbation: Secondary | ICD-10-CM | POA: Diagnosis not present

## 2020-06-28 MED ORDER — PREDNISONE 20 MG PO TABS: 40.0000 mg | ORAL_TABLET | Freq: Every day | ORAL | 0 refills | Status: DC

## 2020-06-28 NOTE — Assessment & Plan Note (Signed)
Patient with an asthma exacerbation following a viral upper respiratory infection.  She had a negative Covid PCR per patient report.  Discussed continued use of albuterol on a schedule every 6 hours.  We will place her on prednisone 40 mg once daily for 5 days.  Discussed that this would likely help calm down any inflammation in her airways.  Discussed if she is not improving over the next couple of days she needs to follow-up with her PCP.  Advised to seek medical attention for any worsening breathing issues, fevers, or chest pain.  Advised to monitor for any odd sensations with starting the prednisone as she does have diabetes and does not have a way to check her blood glucose.  She will contact us if she notices any odd symptoms.

## 2020-06-28 NOTE — Progress Notes (Signed)
Virtual Visit via telephone Note  This visit type was conducted due to national recommendations for restrictions regarding the COVID-19 pandemic (e.g. social distancing).  This format is felt to be most appropriate for this patient at this time.  All issues noted in this document were discussed and addressed.  No physical exam was performed (except for noted visual exam findings with Video Visits).   I connected with Katrina [name] today at 12:40 PM EST by telephone and verified that I am speaking with the correct person using two identifiers. Location patient: home Location provider: work  Persons participating in the virtual visit: patient, provider  I discussed the limitations, risks, security and privacy concerns of performing an evaluation and management service by telephone and the availability of in person appointments. I also discussed with the patient that there may be a patient responsible charge related to this service. The patient expressed understanding and agreed to proceed.  Interactive audio and video telecommunications were attempted between this provider and patient, however failed, due to patient having technical difficulties OR patient did not have access to video capability.  We continued and completed visit with audio only.   Reason for visit: same day visit  HPI: Asthma exacerbation: The patient reports onset of symptoms 5 days ago.  She traveled to see her new grandchild and her other grandson had similar symptoms.  He had a negative Covid test.  The patient notes onset of cough with some upper respiratory symptoms.  The upper respiratory symptoms have improved for the most part at this time though she continues to have a nonproductive cough with loud wheezing.  Some chest congestion.  Very minimal shortness of breath.  Initially she had a fever that is resolved.  She reports a negative Covid PCR test that was taken on Thursday.  No Covid exposures.  She is vaccinated against  COVID-19.  Her albuterol has been somewhat helpful.   ROS: See pertinent positives and negatives per HPI.  Past Medical History:  Diagnosis Date  . BCC (basal cell carcinoma of skin) Infundibulocystic 03/11/2008   Inner Left Eye  . Chest pain at rest 11/07/2012  . Diabetes mellitus without complication ([place])   . Eosinophilic esophagitis 04/21/7251  . Esophageal stricture   . Exercise-induced asthma   . GERD (gastroesophageal reflux disease)   . Hx of colonic polyps 03/2020  . Hypertension 09/27/2017  . Hypothyroidism   . Sarcoidosis   . Shortness of breath 11/08/2012   RECENTLY  & WHEN i EXERCISE"  . Solar lentigo 03/11/2008   Back - Atypical  . Thyroid disease   . Upper airway cough syndrome     Past Surgical History:  Procedure Laterality Date  . ABDOMINAL HYSTERECTOMY     Partial  . ESOPHAGOGASTRODUODENOSCOPY (EGD) WITH ESOPHAGEAL DILATION N/A 11/10/2012   Procedure: ESOPHAGOGASTRODUODENOSCOPY (EGD) WITH ESOPHAGEAL DILATION;  Surgeon: Gatha Mayer, MD;  Location: [facility];  Service: Endoscopy;  Laterality: N/A;  [place] vs. Balloon  . VIDEO BRONCHOSCOPY WITH ENDOBRONCHIAL ULTRASOUND Bilateral 01/15/2015   Procedure: VIDEO BRONCHOSCOPY WITH ENDOBRONCHIAL ULTRASOUND of  LEFT LUNG LOWER LOBE and right lung;  Surgeon: Collene Gobble, MD;  Location: [facility];  Service: Thoracic;  Laterality: Bilateral;    Family History  Problem Relation Age of Onset  . Diabetes Mother   . Stroke Mother   . Coronary artery disease Father   . Coronary artery disease Brother   . Hypothyroidism Maternal Grandmother   . Diabetes Maternal Grandfather   .  Heart disease Paternal Grandmother   . Heart disease Paternal Grandfather     SOCIAL HX: Non-smoker   Current Outpatient Medications:  .  albuterol (VENTOLIN HFA) 108 (90 Base) MCG/ACT inhaler, INHALE 1 TO 2 PUFFS INTO THE LUNGS EVERY 6 HOURS AS NEEDED FOR WHEEZING OR SHORTNESS OF BREATH, Disp: 6.7 g, Rfl: 1 .  aspirin EC 81 MG tablet,  Take 1 tablet (81 mg total) by mouth daily., Disp: 90 tablet, Rfl: 1 .  aspirin-acetaminophen-caffeine (EXCEDRIN MIGRAINE) 250-250-65 MG tablet, Take 1 tablet by mouth every 6 (six) hours as needed for headache., Disp: , Rfl:  .  celecoxib (CELEBREX) 200 MG capsule, Take 1 capsule (200 mg total) by mouth 2 (two) times daily as needed. One to 2 tablets by mouth daily as needed for pain., Disp: 60 capsule, Rfl: 2 .  diphenhydrAMINE (BENADRYL) 25 MG tablet, Take 1 tablet (25 mg total) by mouth every 8 (eight) hours as needed., Disp: 20 tablet, Rfl: 0 .  DULoxetine (CYMBALTA) 30 MG capsule, Take 1 capsule (30 mg total) by mouth daily., Disp: 90 capsule, Rfl: 0 .  ibuprofen (ADVIL) 200 MG tablet, Take 200 mg by mouth every 6 (six) hours as needed for headache or mild pain., Disp: , Rfl:  .  levothyroxine (SYNTHROID) 175 MCG tablet, Take 1 tablet (175 mcg total) by mouth daily before breakfast., Disp: 90 tablet, Rfl: 0 .  losartan (COZAAR) 50 MG tablet, Take 1 tablet (50 mg total) by mouth daily., Disp: 30 tablet, Rfl: 0 .  omeprazole (PRILOSEC) 20 MG capsule, Take 20 mg by mouth daily., Disp: , Rfl:  .  polyvinyl alcohol (LIQUIFILM TEARS) 1.4 % ophthalmic solution, Place 1 drop into both eyes as needed for dry eyes (itching/allergies)., Disp: , Rfl:  .  PRED FORTE 1 % ophthalmic suspension, 1 drop 2 (two) times daily., Disp: , Rfl:  .  predniSONE (DELTASONE) 20 MG tablet, Take 2 tablets (40 mg total) by mouth daily with breakfast., Disp: 10 tablet, Rfl: 0 .  spironolactone (ALDACTONE) 25 MG tablet, Take 25 mg by mouth daily., Disp: , Rfl:   EXAM:  VITALS per patient if applicable:  GENERAL: alert, oriented, appears well and in no acute distress  HEENT: atraumatic, conjunttiva clear, no obvious abnormalities on inspection of external nose and ears  NECK: normal movements of the head and neck  LUNGS: on inspection no signs of respiratory distress, breathing rate appears normal, no obvious gross SOB,  gasping or wheezing  CV: no obvious cyanosis  MS: moves all visible extremities without noticeable abnormality  PSYCH/NEURO: pleasant and cooperative, no obvious depression or anxiety, speech and thought processing grossly intact  ASSESSMENT AND PLAN:  Discussed the following assessment and plan:  Problem List Items Addressed This Visit    Asthma exacerbation    Patient with an asthma exacerbation following a viral upper respiratory infection.  She had a negative Covid PCR per patient report.  Discussed continued use of albuterol on a schedule every 6 hours.  We will place her on prednisone 40 mg once daily for 5 days.  Discussed that this would likely help calm down any inflammation in her airways.  Discussed if she is not improving over the next couple of days she needs to follow-up with her PCP.  Advised to seek medical attention for any worsening breathing issues, fevers, or chest pain.  Advised to monitor for any odd sensations with starting the prednisone as she does have diabetes and does not have a way  to check her blood glucose.  She will contact us if she notices any odd symptoms.      Relevant Medications   predniSONE (DELTASONE) 20 MG tablet       I discussed the assessment and treatment plan with the patient. The patient was provided an opportunity to ask questions and all were answered. The patient agreed with the plan and demonstrated an understanding of the instructions.   The patient was advised to call back or seek an in-person evaluation if the symptoms worsen or if the condition fails to improve as anticipated.  I provided 7 minutes of non-face-to-face time during this encounter.   Tommi Rumps, MD

## 2020-06-30 NOTE — Telephone Encounter (Signed)
Pt seen on 3/12 and prescribed prednisone.

## 2020-07-21 ENCOUNTER — Encounter: Payer: Self-pay | Admitting: Family

## 2020-07-21 ENCOUNTER — Ambulatory Visit (INDEPENDENT_AMBULATORY_CARE_PROVIDER_SITE_OTHER): Payer: Medicare Other | Admitting: Family

## 2020-07-21 ENCOUNTER — Other Ambulatory Visit: Payer: Self-pay

## 2020-07-21 VITALS — BP 160/78 | HR 74 | Temp 98.2°F | Ht 62.0 in | Wt 204.6 lb

## 2020-07-21 DIAGNOSIS — I10 Essential (primary) hypertension: Secondary | ICD-10-CM | POA: Diagnosis not present

## 2020-07-21 DIAGNOSIS — M79661 Pain in right lower leg: Secondary | ICD-10-CM

## 2020-07-21 DIAGNOSIS — M79662 Pain in left lower leg: Secondary | ICD-10-CM

## 2020-07-21 DIAGNOSIS — E038 Other specified hypothyroidism: Secondary | ICD-10-CM | POA: Diagnosis not present

## 2020-07-21 DIAGNOSIS — E119 Type 2 diabetes mellitus without complications: Secondary | ICD-10-CM | POA: Diagnosis not present

## 2020-07-21 LAB — COMPREHENSIVE METABOLIC PANEL
ALT: 24 U/L (ref 0–35)
AST: 18 U/L (ref 0–37)
Albumin: 4 g/dL (ref 3.5–5.2)
Alkaline Phosphatase: 79 U/L (ref 39–117)
BUN: 14 mg/dL (ref 6–23)
CO2: 29 mEq/L (ref 19–32)
Calcium: 9.5 mg/dL (ref 8.4–10.5)
Chloride: 102 mEq/L (ref 96–112)
Creatinine, Ser: 0.65 mg/dL (ref 0.40–1.20)
GFR: 91.79 mL/min (ref >60.00)
Glucose, Bld: 171 mg/dL — ABNORMAL HIGH (ref 70–99)
Potassium: 4.1 mEq/L (ref 3.5–5.1)
Sodium: 138 mEq/L (ref 135–145)
Total Bilirubin: 0.5 mg/dL (ref 0.2–1.2)
Total Protein: 7.1 g/dL (ref 6.0–8.3)

## 2020-07-21 LAB — TSH: TSH: 0.69 u[IU]/mL (ref 0.35–4.50)

## 2020-07-21 LAB — HEMOGLOBIN A1C: Hgb A1c MFr Bld: 7.5 % — ABNORMAL HIGH (ref 4.6–6.5)

## 2020-07-21 MED ORDER — HYDROCHLOROTHIAZIDE 25 MG PO TABS: 25.0000 mg | ORAL_TABLET | Freq: Every day | ORAL | 1 refills | Status: DC

## 2020-07-21 NOTE — Progress Notes (Signed)
Katrina [name] is a 65 y.o. female with the following history as recorded in EpicCare:  Patient Active Problem List   Diagnosis Date Noted  . Asthma exacerbation 06/28/2020  . Hx of colonic polyps 03/2020  . Piriformis syndrome of right side 10/04/2018  . Abnormal electrocardiogram (ECG) (EKG) 08/22/2018  . Prediabetes 08/22/2018  . Hyperlipidemia with target LDL less than 130 08/22/2018  . Type II diabetes mellitus with manifestations ([place]) 08/22/2018  . Esophageal stricture   . Obesity (BMI 30.0-34.9)   . Lobar pneumonia ([place])   . Hypertension 09/27/2017  . 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 Low back pain associated with a spinal disorder other than radiculopathy or spinal stenosis 03/01/2016  . Myalgia 10/09/2015  . Upper airway cough syndrome 01/26/2015  . Obesity (BMI 30-39.9) 01/26/2015  . Pulmonary nodules   . DOE (dyspnea on exertion) 01/01/2015  . Pulmonary sarcoidosis ([place])   . Mediastinal lymphadenopathy 12/26/2014  . Esophageal ring, acquired 11/10/2012  . Dysphagia, unspecified(787.20) 11/09/2012  . Hypothyroidism 11/08/2012  . 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 Hypothyroidism, acquired 12/28/2007  . GERD 12/28/2007  . 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 albuterol (VENTOLIN HFA) 108 (90 Base) MCG/ACT inhaler INHALE 1 TO 2 PUFFS INTO THE LUNGS EVERY 6 HOURS AS NEEDED FOR WHEEZING OR SHORTNESS OF BREATH 6.7 g 1  . hydrochlorothiazide (HYDRODIURIL) 25 MG tablet Take 1 tablet (25 mg total) by mouth daily. 30 tablet 1  . ibuprofen (ADVIL) 200 MG tablet Take 200 mg by mouth every 6 (six) hours as needed for headache or mild pain.    Marland Kitchen levothyroxine (SYNTHROID) 175 MCG tablet Take 1 tablet (175 mcg total) by mouth daily before breakfast. 90 tablet 0  . omeprazole (PRILOSEC) 20 MG capsule Take 20 mg by mouth daily.    . polyvinyl alcohol (LIQUIFILM  TEARS) 1.4 % ophthalmic solution Place 1 drop into both eyes as needed for dry eyes (itching/allergies).    Marland Kitchen aspirin EC 81 MG tablet Take 1 tablet (81 mg total) by mouth daily. (Patient not taking: Reported on 07/21/2020) 90 tablet 1  . aspirin-acetaminophen-caffeine (EXCEDRIN MIGRAINE) 250-250-65 MG tablet Take 1 tablet by mouth every 6 (six) hours as needed for headache. (Patient not taking: Reported on 07/21/2020)    . celecoxib (CELEBREX) 200 MG capsule Take 1 capsule (200 mg total) by mouth 2 (two) times daily as needed. One to 2 tablets by mouth daily as needed for pain. (Patient not taking: Reported on 07/21/2020) 60 capsule 2  . diphenhydrAMINE (BENADRYL) 25 MG tablet Take 1 tablet (25 mg total) by mouth every 8 (eight) hours as needed. (Patient not taking: Reported on 07/21/2020) 20 tablet 0   No current facility-administered medications for this visit.    Allergies: Penicillins, Amlodipine, Lisinopril, Metformin and related, Bactrim ds [sulfamethoxazole-trimethoprim], and Metformin  Past Medical History:  Diagnosis Date  . BCC (basal cell carcinoma of skin) Infundibulocystic 03/11/2008   Inner Left Eye  . Chest pain at rest 11/07/2012  . Diabetes mellitus without complication ([place])   . Eosinophilic esophagitis 38/75/6433  . Esophageal stricture   . Exercise-induced asthma   . GERD (gastroesophageal reflux disease)   . Hx of colonic polyps 03/2020  . Hypertension 09/27/2017  . Hypothyroidism   . Sarcoidosis   . Shortness of breath 11/08/2012   RECENTLY  & WHEN i EXERCISE"  . Solar lentigo 03/11/2008  Back - Atypical  . Thyroid disease   . Upper airway cough syndrome     Past Surgical History:  Procedure Laterality Date  . ABDOMINAL HYSTERECTOMY     Partial  . ESOPHAGOGASTRODUODENOSCOPY (EGD) WITH ESOPHAGEAL DILATION N/A 11/10/2012   Procedure: ESOPHAGOGASTRODUODENOSCOPY (EGD) WITH ESOPHAGEAL DILATION;  Surgeon: Gatha Mayer, MD;  Location: [facility];  Service: Endoscopy;   Laterality: N/A;  [place] vs. Balloon  . VIDEO BRONCHOSCOPY WITH ENDOBRONCHIAL ULTRASOUND Bilateral 01/15/2015   Procedure: VIDEO BRONCHOSCOPY WITH ENDOBRONCHIAL ULTRASOUND of  LEFT LUNG LOWER LOBE and right lung;  Surgeon: Collene Gobble, MD;  Location: [facility];  Service: Thoracic;  Laterality: Bilateral;    Family History  Problem Relation Age of Onset  . Diabetes Mother   . Stroke Mother   . Coronary artery disease Father   . Coronary artery disease Brother   . Hypothyroidism Maternal Grandmother   . Diabetes Maternal Grandfather   . Heart disease Paternal Grandmother   . Heart disease Paternal Grandfather     Social History   Tobacco Use  . Smoking status: Never Smoker  . Smokeless tobacco: Never Used  Substance Use Topics  . Alcohol use: No    Subjective:  Patient presents with concerns for elevated blood pressure; very difficult to get her blood pressure controlled as she has numerous allergies/ sensitivities to her medication; she has been on Losartan 50 mg since November 2021 but is now concerned that this medication is causing chronic diarrhea and bilateral leg pain; feels like both legs have "bad shin splints" Has gained 8 pounds since last OV in December 2021;      Objective:  Vitals:   07/21/20 1132  BP: (!) 160/78  Pulse: 74  Temp: 98.2 F (36.8 C)  TempSrc: Oral  SpO2: 97%  Weight: 204 lb 9.6 oz (92.8 kg)  Height: 5' 2" (1.575 m)    General: Well developed, well nourished, in no acute distress  Skin : Warm and dry.  Head: Normocephalic and atraumatic  Eyes: Sclera and conjunctiva clear; pupils round and reactive to light; extraocular movements intact  Ears: External normal; canals clear; tympanic membranes normal  Oropharynx: Pink, supple. No suspicious lesions  Neck: Supple without thyromegaly, adenopathy  Lungs: Respirations unlabored; clear to auscultation bilaterally without wheeze, rales, rhonchi  CVS exam: normal rate and regular rhythm.   Musculoskeletal: No deformities; no active joint inflammation  Extremities: No edema, cyanosis, clubbing  Vessels: Symmetric bilaterally  Neurologic: Alert and oriented; speech intact; face symmetrical; moves all extremities well; CNII-XII intact without focal deficit   Assessment:  1. Primary hypertension   2. Other specified hypothyroidism   3. Diet-controlled type 2 diabetes mellitus ([place])   4. Bilateral calf pain     Plan:  1. Will stop Losartan due to possible side effects; patient has had numerous allergies to multiple medications; will try changing to HCTZ 25 mg daily; if symptoms of diarrhea and leg pain persist, then will consider re-starting Losartan with the HCTZ; she will need a follow-up in 1 month or so;   She is aware that I am leaving this location and has not decided if she wants to stay at this office or transfer; she will make a decision and then follow-up can be determined. 2. Check TSH today; 3. Check Hgba1c; 4. Suspect muscular source if symptoms persist after stopping the Losartan; check D-dimer today and could consider doppler;   This visit occurred during the SARS-CoV-2 public health emergency.  Safety protocols  were in place, including screening questions prior to the visit, additional usage of staff PPE, and extensive cleaning of exam room while observing appropriate contact time as indicated for disinfecting solutions.     No follow-ups on file.  Orders Placed This Encounter  Procedures  . TSH    Standing Status:   Future    Number of Occurrences:   1    Standing Expiration Date:   07/21/2021  . Comp Met (CMET)    Standing Status:   Future    Number of Occurrences:   1    Standing Expiration Date:   07/21/2021  . Hemoglobin A1c    Standing Status:   Future    Number of Occurrences:   1    Standing Expiration Date:   07/21/2021  . D-Dimer, Quantitative    Standing Status:   Future    Number of Occurrences:   1    Standing Expiration Date:   07/21/2021     Requested Prescriptions   Signed Prescriptions Disp Refills  . hydrochlorothiazide (HYDRODIURIL) 25 MG tablet 30 tablet 1    Sig: Take 1 tablet (25 mg total) by mouth daily.

## 2020-07-22 LAB — D-DIMER, QUANTITATIVE: D-Dimer, Quant: 0.2 mcg/mL FEU (ref <0.50)

## 2020-08-01 ENCOUNTER — Other Ambulatory Visit: Payer: Self-pay

## 2020-08-01 ENCOUNTER — Ambulatory Visit
Admission: RE | Admit: 2020-08-01 | Discharge: 2020-08-01 | Disposition: A | Discharge status: Home / Self Care | Payer: Medicare Other | Source: Ambulatory Visit | Attending: Family | Admitting: Family

## 2020-08-01 DIAGNOSIS — Z1231 Encounter for screening mammogram for malignant neoplasm of breast: Secondary | ICD-10-CM

## 2020-08-04 ENCOUNTER — Other Ambulatory Visit: Payer: Self-pay

## 2020-08-04 ENCOUNTER — Ambulatory Visit (INDEPENDENT_AMBULATORY_CARE_PROVIDER_SITE_OTHER): Payer: Medicare Other | Admitting: Family

## 2020-08-04 VITALS — BP 142/70 | HR 77 | Temp 98.1°F | Ht 62.0 in | Wt 201.6 lb

## 2020-08-04 DIAGNOSIS — I10 Essential (primary) hypertension: Secondary | ICD-10-CM

## 2020-08-04 DIAGNOSIS — R0600 Dyspnea, unspecified: Secondary | ICD-10-CM | POA: Diagnosis not present

## 2020-08-04 DIAGNOSIS — E038 Other specified hypothyroidism: Secondary | ICD-10-CM | POA: Diagnosis not present

## 2020-08-04 DIAGNOSIS — E118 Type 2 diabetes mellitus with unspecified complications: Secondary | ICD-10-CM

## 2020-08-04 DIAGNOSIS — R0609 Other forms of dyspnea: Secondary | ICD-10-CM

## 2020-08-04 MED ORDER — BLOOD GLUCOSE MONITOR KIT: PACK | 0 refills | Status: DC

## 2020-08-04 MED ORDER — VALSARTAN 80 MG PO TABS: 80.0000 mg | ORAL_TABLET | Freq: Every day | ORAL | 0 refills | Status: DC

## 2020-08-04 NOTE — Progress Notes (Signed)
Katrina [name] is a 65 y.o. female with the following history as recorded in EpicCare:  Patient Active Problem List   Diagnosis Date Noted  . Asthma exacerbation 06/28/2020  . Hx of colonic polyps 03/2020  . Piriformis syndrome of right side 10/04/2018  . Abnormal electrocardiogram (ECG) (EKG) 08/22/2018  . Prediabetes 08/22/2018  . Hyperlipidemia with target LDL less than 130 08/22/2018  . Type II diabetes mellitus with manifestations ([place]) 08/22/2018  . Esophageal stricture   . Obesity (BMI 30.0-34.9)   . Lobar pneumonia ([place])   . Hypertension 09/27/2017  . 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 Low back pain associated with a spinal disorder other than radiculopathy or spinal stenosis 03/01/2016  . Myalgia 10/09/2015  . Upper airway cough syndrome 01/26/2015  . Obesity (BMI 30-39.9) 01/26/2015  . Pulmonary nodules   . DOE (dyspnea on exertion) 01/01/2015  . Pulmonary sarcoidosis ([place])   . Mediastinal lymphadenopathy 12/26/2014  . Esophageal ring, acquired 11/10/2012  . Dysphagia, unspecified(787.20) 11/09/2012  . Hypothyroidism 11/08/2012  . 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 Hypothyroidism, acquired 12/28/2007  . GERD 12/28/2007  . 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 albuterol (VENTOLIN HFA) 108 (90 Base) MCG/ACT inhaler INHALE 1 TO 2 PUFFS INTO THE LUNGS EVERY 6 HOURS AS NEEDED FOR WHEEZING OR SHORTNESS OF BREATH 6.7 g 1  . aspirin-acetaminophen-caffeine (EXCEDRIN MIGRAINE) 250-250-65 MG tablet Take 1 tablet by mouth every 6 (six) hours as needed for headache.    . blood glucose meter kit and supplies KIT Dispense based on patient and insurance preference. 1 each 0  . hydrochlorothiazide (HYDRODIURIL) 25 MG tablet Take 1 tablet (25 mg total) by mouth daily. 30 tablet 1  . ibuprofen (ADVIL) 200 MG tablet Take 200 mg by mouth every 6 (six) hours as needed for  headache or mild pain.    Marland Kitchen levothyroxine (SYNTHROID) 175 MCG tablet Take 1 tablet (175 mcg total) by mouth daily before breakfast. 90 tablet 0  . omeprazole (PRILOSEC) 20 MG capsule Take 20 mg by mouth daily.    . valsartan (DIOVAN) 80 MG tablet Take 1 tablet (80 mg total) by mouth daily. 90 tablet 0   No current facility-administered medications for this visit.    Allergies: Penicillins, Amlodipine, Lisinopril, Metformin and related, Bactrim ds [sulfamethoxazole-trimethoprim], and Metformin  Past Medical History:  Diagnosis Date  . BCC (basal cell carcinoma of skin) Infundibulocystic 03/11/2008   Inner Left Eye  . Chest pain at rest 11/07/2012  . Diabetes mellitus without complication ([place])   . Eosinophilic esophagitis 82/80/0349  . Esophageal stricture   . Exercise-induced asthma   . GERD (gastroesophageal reflux disease)   . Hx of colonic polyps 03/2020  . Hypertension 09/27/2017  . Hypothyroidism   . Sarcoidosis   . Shortness of breath 11/08/2012   RECENTLY  & WHEN i EXERCISE"  . Solar lentigo 03/11/2008   Back - Atypical  . Thyroid disease   . Upper airway cough syndrome     Past Surgical History:  Procedure Laterality Date  . ABDOMINAL HYSTERECTOMY     Partial  . ESOPHAGOGASTRODUODENOSCOPY (EGD) WITH ESOPHAGEAL DILATION N/A 11/10/2012   Procedure: ESOPHAGOGASTRODUODENOSCOPY (EGD) WITH ESOPHAGEAL DILATION;  Surgeon: Gatha Mayer, MD;  Location: [facility];  Service: Endoscopy;  Laterality: N/A;  [place] vs. Balloon  . VIDEO BRONCHOSCOPY WITH ENDOBRONCHIAL ULTRASOUND Bilateral 01/15/2015   Procedure: VIDEO BRONCHOSCOPY WITH  ENDOBRONCHIAL ULTRASOUND of  LEFT LUNG LOWER LOBE and right lung;  Surgeon: Collene Gobble, MD;  Location: [facility];  Service: Thoracic;  Laterality: Bilateral;    Family History  Problem Relation Age of Onset  . Diabetes Mother   . Stroke Mother   . Coronary artery disease Father   . Coronary artery disease Brother   . Hypothyroidism Maternal  Grandmother   . Diabetes Maternal Grandfather   . Heart disease Paternal Grandmother   . Heart disease Paternal Grandfather     Social History   Tobacco Use  . Smoking status: Never Smoker  . Smokeless tobacco: Never Used  Substance Use Topics  . Alcohol use: No    Subjective:   Follow-up on hypertension; is tolerating HCTZ 25 mg well but is concerned that not strong enough; Denies any chest pain, shortness of breath, blurred vision or headache. Has felt better off Losartan; does note that it was the most recent form of Losartan that was problematic- was able to take a version of it in the past with no difficulty;   Most recent Hgba1c at 7.5; patient is wanting to avoid medication; wants to work on diet/ exercise    Objective:  Vitals:   08/04/20 1520  BP: (!) 142/70  Pulse: 77  Temp: 98.1 F (36.7 C)  TempSrc: Oral  SpO2: 96%  Weight: 201 lb 9.6 oz (91.4 kg)  Height: $Remove'5\' 2"'[id number]$  (1.575 m)    General: Well developed, well nourished, in no acute distress  Skin : Warm and dry.  Head: Normocephalic and atraumatic  Lungs: Respirations unlabored; clear to auscultation bilaterally without wheeze, rales, rhonchi  CVS exam: normal rate and regular rhythm.  Neurologic: Alert and oriented; speech intact; face symmetrical; moves all extremities well; CNII-XII intact without focal deficit   Assessment:  1. Essential hypertension   2. Type II diabetes mellitus with manifestations ([place])   3. DOE (dyspnea on exertion)   4. Other specified hypothyroidism     Plan:  1. Trial of Diovan 80 mg in addition to HCTZ 25 mg daily;  2. Patient does not want medication at this time; Rx for glucometer given; plan to re-check her labs in 2 months; will need statin at later date- due to multiple medication sensitivities, want to get her blood pressure stable before adding more medication;  3. Keep planned follow-up with cardiology;  4. Stable;   Due for AWV and needs DEXA/ vaccines- can be discussed  at later date now that has Medicare in place for health needs;  This visit occurred during the SARS-CoV-2 public health emergency.  Safety protocols were in place, including screening questions prior to the visit, additional usage of staff PPE, and extensive cleaning of exam room while observing appropriate contact time as indicated for disinfecting solutions.     Return in about 2 months (around 10/04/2020) for [facility] with Dr. Sharlet Salina or Dr. Ronnald Ramp ( 1st available).  No orders of the defined types were placed in this encounter.   Requested Prescriptions   Signed Prescriptions Disp Refills  . blood glucose meter kit and supplies KIT 1 each 0    Sig: Dispense based on patient and insurance preference.  . valsartan (DIOVAN) 80 MG tablet 90 tablet 0    Sig: Take 1 tablet (80 mg total) by mouth daily.

## 2020-08-11 IMAGING — CR DG CHEST 2V
2 series · 2 of 2 positions shown · non-contrast
Comparison: Prior CT from 04/30/2018

CLINICAL DATA: Initial evaluation for acute chest pain.

EXAM:
CHEST - 2 VIEW

[w chest pa]
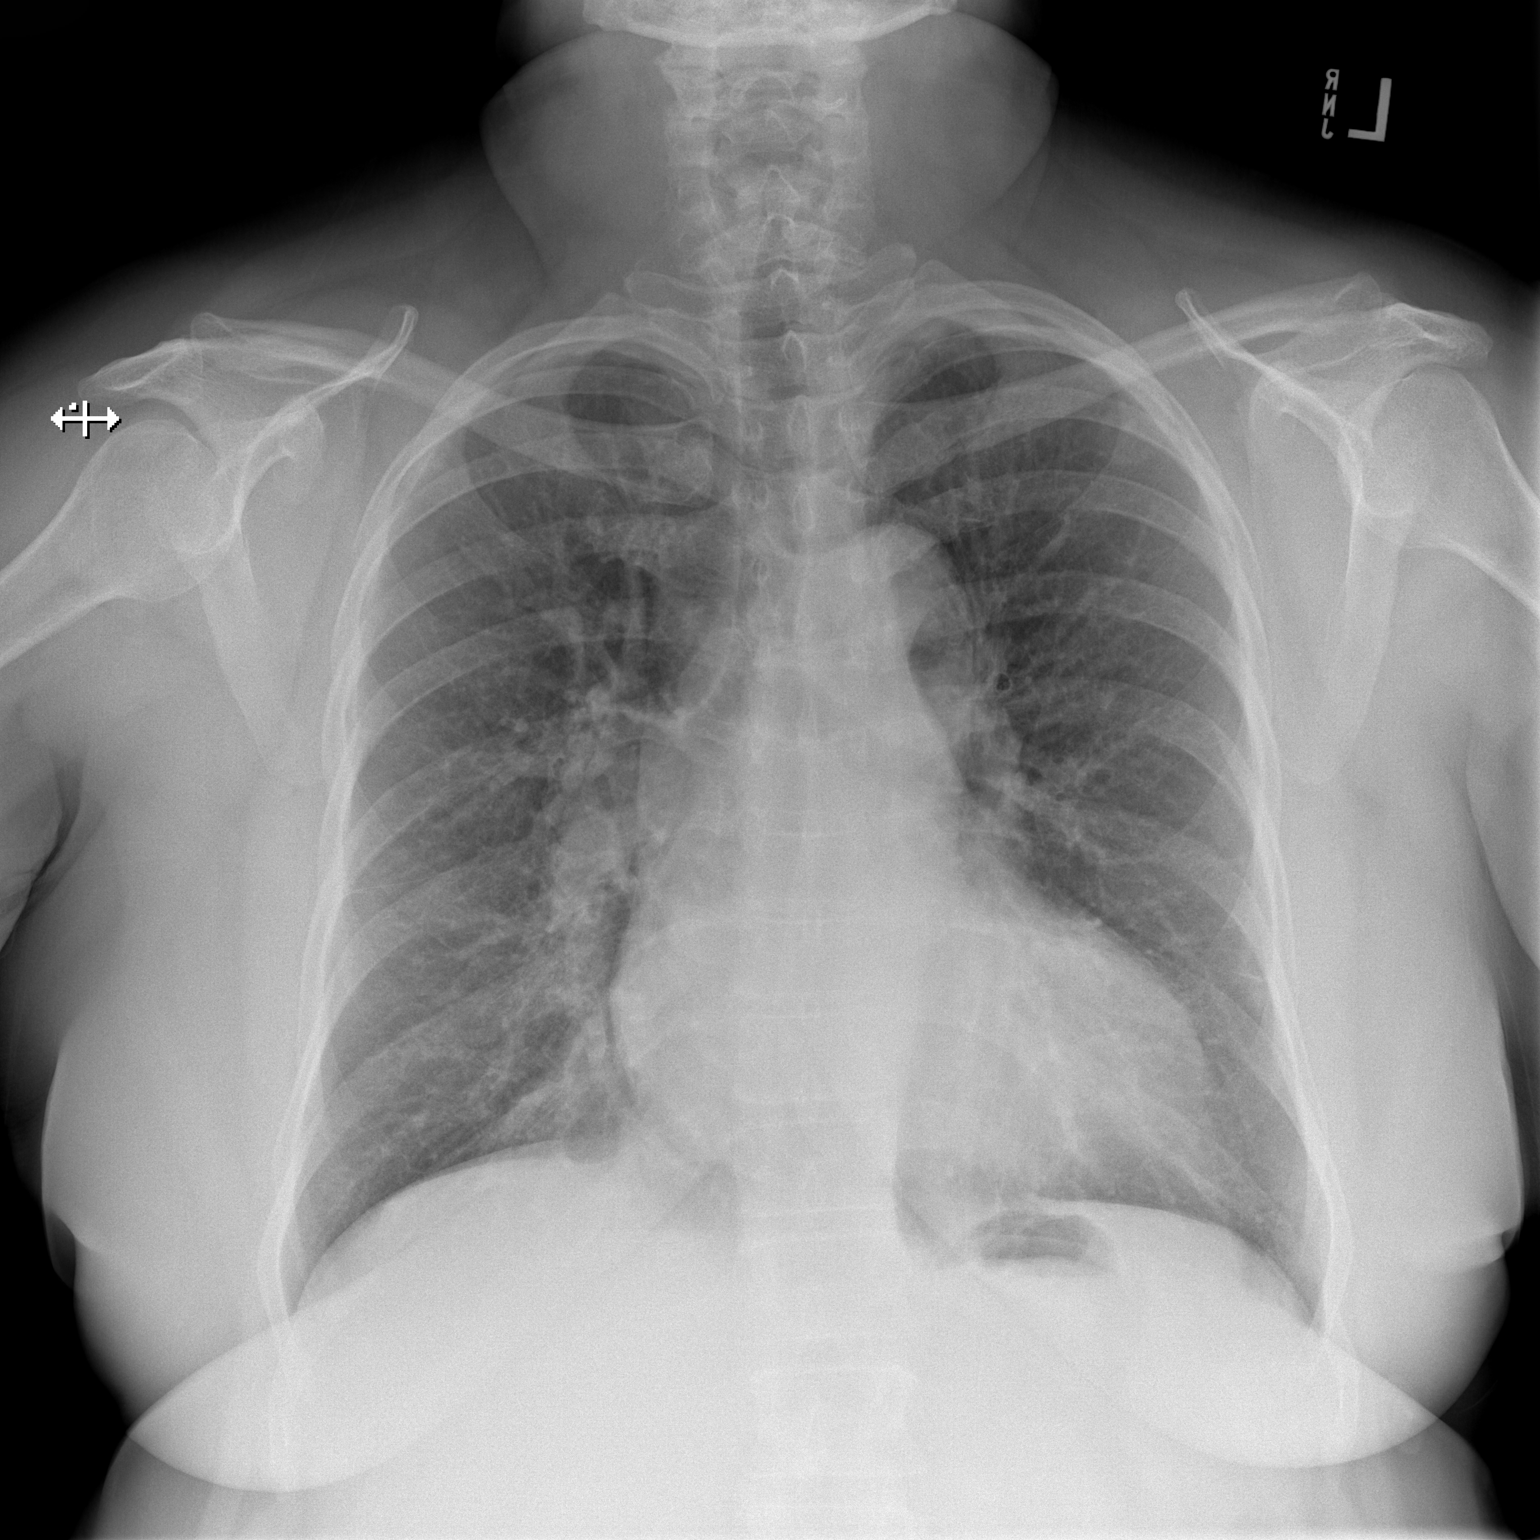

[w chest lat]
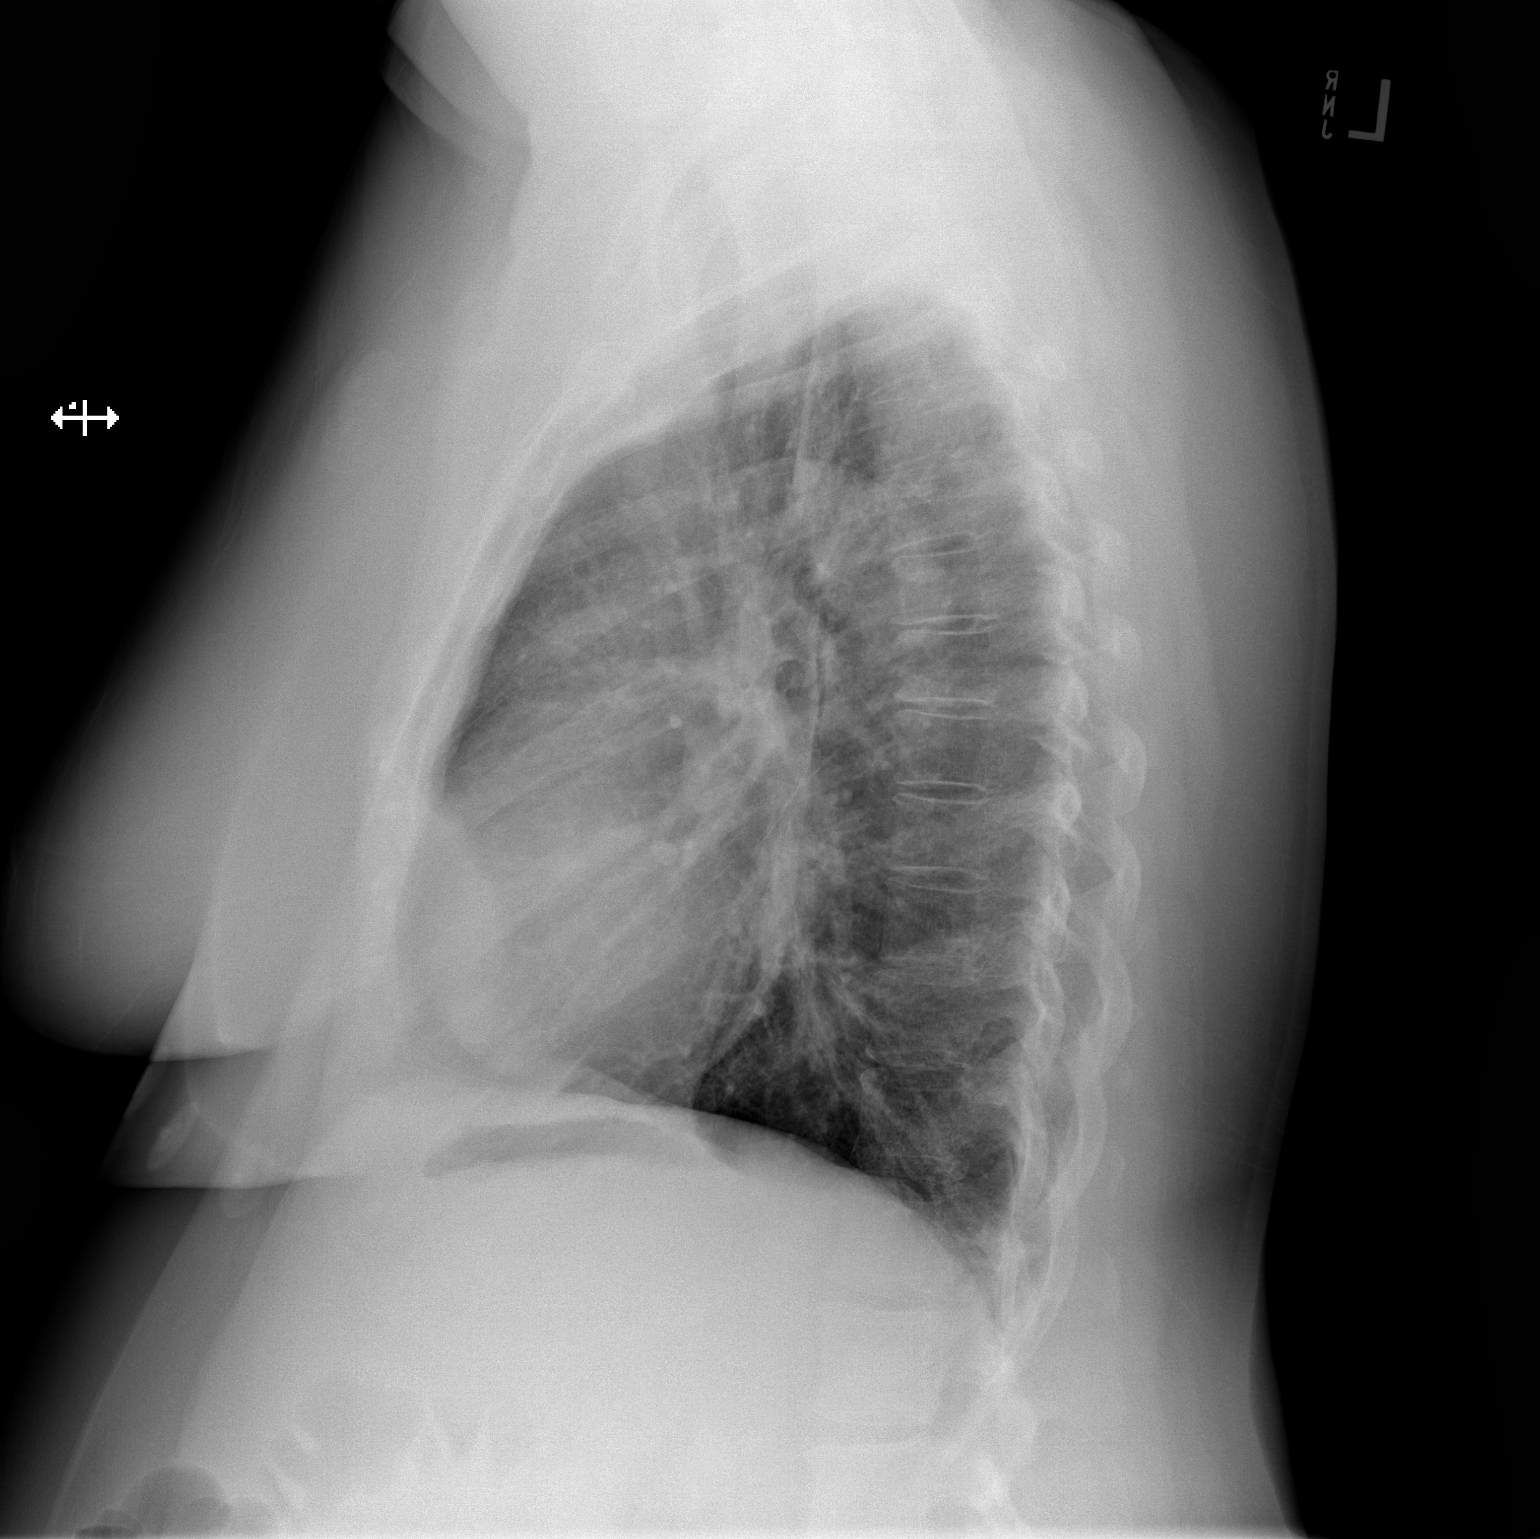

[2 of 2 positions shown; findings below may reference images not displayed]

FINDINGS: Mild cardiomegaly, stable. Mediastinal silhouette within normal
limits.

Lungs normally inflated. Mild perihilar vascular congestion without
overt pulmonary edema. No infiltrates. No pleural effusion. No
pneumothorax.

No acute osseous finding
IMPRESSION: 1. Cardiomegaly with mild perihilar vascular congestion without
overt pulmonary edema.
2. No other active cardiopulmonary disease.

## 2020-09-03 ENCOUNTER — Ambulatory Visit: Payer: Medicare Other | Admitting: Internal Medicine

## 2020-09-03 ENCOUNTER — Telehealth: Payer: Self-pay

## 2020-09-03 NOTE — Telephone Encounter (Signed)
AWV appoint schedule patients question was answered pertaining to what the AWV consist  Of.

## 2020-09-09 DIAGNOSIS — M67472 Ganglion, left ankle and foot: Secondary | ICD-10-CM | POA: Insufficient documentation

## 2020-09-10 DIAGNOSIS — R2242 Localized swelling, mass and lump, left lower limb: Secondary | ICD-10-CM | POA: Insufficient documentation

## 2020-09-16 DIAGNOSIS — S92501D Displaced unspecified fracture of right lesser toe(s), subsequent encounter for fracture with routine healing: Secondary | ICD-10-CM | POA: Insufficient documentation

## 2020-09-16 DIAGNOSIS — S99921A Unspecified injury of right foot, initial encounter: Secondary | ICD-10-CM | POA: Insufficient documentation

## 2020-10-01 ENCOUNTER — Ambulatory Visit (INDEPENDENT_AMBULATORY_CARE_PROVIDER_SITE_OTHER): Payer: Medicare Other | Admitting: Internal Medicine

## 2020-10-01 ENCOUNTER — Encounter: Payer: Self-pay | Admitting: Internal Medicine

## 2020-10-01 ENCOUNTER — Other Ambulatory Visit: Payer: Self-pay

## 2020-10-01 DIAGNOSIS — I10 Essential (primary) hypertension: Secondary | ICD-10-CM

## 2020-10-01 DIAGNOSIS — E039 Hypothyroidism, unspecified: Secondary | ICD-10-CM

## 2020-10-01 DIAGNOSIS — E1169 Type 2 diabetes mellitus with other specified complication: Secondary | ICD-10-CM | POA: Diagnosis not present

## 2020-10-01 DIAGNOSIS — E118 Type 2 diabetes mellitus with unspecified complications: Secondary | ICD-10-CM

## 2020-10-01 DIAGNOSIS — E785 Hyperlipidemia, unspecified: Secondary | ICD-10-CM

## 2020-10-01 MED ORDER — HYDROCHLOROTHIAZIDE 25 MG PO TABS: 25.0000 mg | ORAL_TABLET | Freq: Every day | ORAL | 3 refills | Status: DC

## 2020-10-01 MED ORDER — LOSARTAN POTASSIUM 50 MG PO TABS: 50.0000 mg | ORAL_TABLET | Freq: Every day | ORAL | 3 refills | Status: DC

## 2020-10-01 NOTE — Progress Notes (Signed)
   Subjective:   Patient ID: Katrina [name], female    DOB: 1956-03-19, 65 y.o.   MRN: [id number]  HPI The patient is a 65 YO female coming in for transition of care as her provider left office as well as follow up BP (started on valsartan 80 mg about 2 months ago, she did get leg swelling and stopped taking this, is taking hctz 25 mg daily, would like to go back to losartan which she has taken before okay, denies chest pains or headaches), and thyroid (takes synthroid 175 mcg daily and denies missing doses, denies heat or cold intolerance), and diabetes (HgA1c was up at last visit and advised to start checking sugars, she has not done this due to not getting strips for meter, denies low sugars, diet controlled currently, not on statin).   Review of Systems  Constitutional: Negative.   HENT: Negative.    Eyes: Negative.   Respiratory:  Negative for cough, chest tightness and shortness of breath.   Cardiovascular:  Negative for chest pain, palpitations and leg swelling.  Gastrointestinal:  Negative for abdominal distention, abdominal pain, constipation, diarrhea, nausea and vomiting.  Musculoskeletal: Negative.   Skin: Negative.   Neurological: Negative.   Psychiatric/Behavioral: Negative.     Objective:  Physical Exam Constitutional:      Appearance: She is well-developed. She is obese.  HENT:     Head: Normocephalic and atraumatic.  Cardiovascular:     Rate and Rhythm: Normal rate and regular rhythm.  Pulmonary:     Effort: Pulmonary effort is normal. No respiratory distress.     Breath sounds: Normal breath sounds. No wheezing or rales.  Abdominal:     General: Bowel sounds are normal. There is no distension.     Palpations: Abdomen is soft.     Tenderness: There is no abdominal tenderness. There is no rebound.  Musculoskeletal:     Cervical back: Normal range of motion.  Skin:    General: Skin is warm and dry.  Neurological:     Mental Status: She is alert and oriented to  person, place, and time.     Coordination: Coordination normal.    Vitals:   10/01/20 0956  BP: (!) 160/76  Pulse: 64  Temp: 98.7 F (37.1 C)  TempSrc: Oral  SpO2: 96%  Weight: 197 lb 3.2 oz (89.4 kg)  Height: 5' 2.5" (1.588 m)    This visit occurred during the SARS-CoV-2 public health emergency.  Safety protocols were in place, including screening questions prior to the visit, additional usage of staff PPE, and extensive cleaning of exam room while observing appropriate contact time as indicated for disinfecting solutions.   Assessment & Plan:

## 2020-10-01 NOTE — Patient Instructions (Addendum)
We will send in the losartan for the blood pressure.   Come back in 2 months or so to check the sugar levels and blood pressure.

## 2020-10-03 NOTE — Assessment & Plan Note (Signed)
BMI 35 but complicated by diabetes, hypertension and hyperlipidemia. Counseled about diet and exercise.

## 2020-10-03 NOTE — Assessment & Plan Note (Signed)
Diet controlled currently and at goal although HgA1c was higher at last check than prior. I would recommend she work on diet and she is motivated to do so. We talked about different medications for diabetes if she does need something. She is coming back in 1-2 months for BP check and will need HgA1c then. Not due today. Not on statin and starting ARB today.

## 2020-10-03 NOTE — Assessment & Plan Note (Signed)
Taking synthroid 175 mcg daily. Will check TSH with next labs.

## 2020-10-03 NOTE — Assessment & Plan Note (Signed)
Moderate exacerbation and not at goal. She is taking hctz 25 mg daily. Will start losartan 50 mg daily and have her return in 1-2 months. Will need labs at follow up.

## 2020-10-03 NOTE — Assessment & Plan Note (Signed)
Not on statin currently and will discuss at next visit.

## 2020-10-06 ENCOUNTER — Ambulatory Visit: Payer: Medicare Other | Admitting: Internal Medicine

## 2020-10-10 ENCOUNTER — Other Ambulatory Visit: Payer: Self-pay

## 2020-10-10 ENCOUNTER — Encounter (HOSPITAL_BASED_OUTPATIENT_CLINIC_OR_DEPARTMENT_OTHER): Payer: Self-pay | Admitting: *Deleted

## 2020-10-10 ENCOUNTER — Emergency Department (HOSPITAL_BASED_OUTPATIENT_CLINIC_OR_DEPARTMENT_OTHER)
Admission: EM | Admit: 2020-10-10 | Discharge: 2020-10-10 | Disposition: A | Discharge status: Home / Self Care | Payer: Medicare Other | Attending: Emergency Medicine | Admitting: Emergency Medicine

## 2020-10-10 DIAGNOSIS — E119 Type 2 diabetes mellitus without complications: Secondary | ICD-10-CM | POA: Insufficient documentation

## 2020-10-10 DIAGNOSIS — E039 Hypothyroidism, unspecified: Secondary | ICD-10-CM | POA: Diagnosis not present

## 2020-10-10 DIAGNOSIS — W208XXA Other cause of strike by thrown, projected or falling object, initial encounter: Secondary | ICD-10-CM | POA: Insufficient documentation

## 2020-10-10 DIAGNOSIS — J45991 Cough variant asthma: Secondary | ICD-10-CM | POA: Insufficient documentation

## 2020-10-10 DIAGNOSIS — Z79899 Other long term (current) drug therapy: Secondary | ICD-10-CM | POA: Diagnosis not present

## 2020-10-10 DIAGNOSIS — S0990XA Unspecified injury of head, initial encounter: Secondary | ICD-10-CM | POA: Diagnosis not present

## 2020-10-10 DIAGNOSIS — Y92002 Bathroom of unspecified non-institutional (private) residence single-family (private) house as the place of occurrence of the external cause: Secondary | ICD-10-CM | POA: Diagnosis not present

## 2020-10-10 DIAGNOSIS — I10 Essential (primary) hypertension: Secondary | ICD-10-CM | POA: Diagnosis not present

## 2020-10-10 NOTE — ED Provider Notes (Signed)
[place] EMERGENCY DEPARTMENT Provider Note   CSN: [id number] Arrival date & time: 10/10/20  1425     History Chief Complaint  Patient presents with   Head Injury    Katrina [name] is a 65 y.o. female.  HPI Patient is a 65 year old female with past medical history detailed below presented today after she incurred a head injury approximately 1.5 hour ago she states that the curtain rod in her bathroom which holds a vinyl shower curtain came down and struck her on the top of the head.  She denies any loss of consciousness nausea vomiting but states that she has a bit of a headache.  She states it is mild seems to be located on top of her head.  She states that it is 1/10.  She does not take any blood thinners, denies any vision changes numbness weakness slurred speech or confusion.  Denies any neck pain no other significant symptoms.  No aggravating mitigating factors for headache.     Past Medical History:  Diagnosis Date   BCC (basal cell carcinoma of skin) Infundibulocystic 03/11/2008   Inner Left Eye   Chest pain at rest 11/07/2012   Diabetes mellitus without complication (HCC)    Eosinophilic esophagitis 46/65/9935   Esophageal stricture    Exercise-induced asthma    GERD (gastroesophageal reflux disease)    Hx of colonic polyps 03/2020   Hypertension 09/27/2017   Hypothyroidism    Sarcoidosis    Shortness of breath 11/08/2012   RECENTLY  & WHEN i EXERCISE"   Solar lentigo 03/11/2008   Back - Atypical   Thyroid disease    Upper airway cough syndrome     Patient Active Problem List   Diagnosis Date Noted   Asthma exacerbation 06/28/2020   Hx of colonic polyps 03/2020   Piriformis syndrome of right side 10/04/2018   Abnormal electrocardiogram (ECG) (EKG) 08/22/2018   Hyperlipidemia associated with type 2 diabetes mellitus ([place]) 08/22/2018   Type 2 diabetes with complication ([place]) 70/17/7939   Esophageal stricture    Hypertension 09/27/2017   Cough  variant asthma vs UACS 01/18/2017   Low back pain associated with a spinal disorder other than radiculopathy or spinal stenosis 03/01/2016   Myalgia 10/09/2015   Upper airway cough syndrome 01/26/2015   Morbid obesity ([place]) 01/26/2015   Pulmonary nodules    Pulmonary sarcoidosis (HCC)    Mediastinal lymphadenopathy 12/26/2014   Esophageal ring, acquired 11/10/2012   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 [place]   VIDEO BRONCHOSCOPY WITH ENDOBRONCHIAL ULTRASOUND Bilateral 01/15/2015   Procedure: VIDEO BRONCHOSCOPY WITH ENDOBRONCHIAL ULTRASOUND of  LEFT LUNG LOWER LOBE and right lung;  Surgeon: Collene Gobble, MD;  Location: [place];  Service: Thoracic;  Laterality: Bilateral;     OB History   No obstetric history on file.     Family History  Problem Relation Age of Onset   Diabetes Mother    Stroke Mother    Coronary artery disease Father    Coronary artery disease Brother    Hypothyroidism Maternal Grandmother  Diabetes Maternal Grandfather    Heart disease Paternal Grandmother    Heart disease Paternal Grandfather     Social History   Tobacco Use   Smoking status: Never   Smokeless tobacco: Never  Vaping Use   Vaping Use: Never used  Substance Use Topics   Alcohol use: No   Drug use: No    Home Medications Prior to Admission medications   Medication Sig Start Date End Date Taking? Authorizing Provider  albuterol (VENTOLIN HFA) 108 (90 Base) MCG/ACT inhaler INHALE 1 TO 2 PUFFS INTO THE LUNGS  EVERY 6 HOURS AS NEEDED FOR WHEEZING OR SHORTNESS OF BREATH 06/11/20   Marrian Salvage, FNP  aspirin-acetaminophen-caffeine (EXCEDRIN MIGRAINE) [phone] MG tablet Take 1 tablet by mouth every 6 (six) hours as needed for headache.    [provider]  blood glucose meter kit and supplies KIT Dispense based on patient and insurance preference. 08/04/20   Marrian Salvage, FNP  hydrochlorothiazide (HYDRODIURIL) 25 MG tablet Take 1 tablet (25 mg total) by mouth daily. 10/01/20   Hoyt Koch, MD  ibuprofen (ADVIL) 200 MG tablet Take 200 mg by mouth every 6 (six) hours as needed for headache or mild pain.    [provider]  levothyroxine (SYNTHROID) 175 MCG tablet Take 1 tablet (175 mcg total) by mouth daily before breakfast. 05/21/20   Marrian Salvage, FNP  losartan (COZAAR) 50 MG tablet Take 1 tablet (50 mg total) by mouth daily. 10/01/20   Hoyt Koch, MD  omeprazole (PRILOSEC) 20 MG capsule Take 20 mg by mouth daily.    [provider]    Allergies    Penicillins, Amlodipine, Lisinopril, Metformin and related, Bactrim ds [sulfamethoxazole-trimethoprim], and Metformin  Review of Systems   Review of Systems  Constitutional:  Negative for chills and fever.  HENT:  Negative for congestion.   Eyes:  Negative for pain.  Respiratory:  Negative for cough and shortness of breath.   Cardiovascular:  Negative for chest pain and leg swelling.  Gastrointestinal:  Negative for abdominal pain and vomiting.  Genitourinary:  Negative for dysuria.  Musculoskeletal:  Negative for myalgias.  Skin:  Negative for rash.  Neurological:  Positive for headaches. Negative for dizziness.   Physical Exam Updated Vital Signs BP 121/72 (BP Location: Right Arm)   Pulse 72   Temp 98.5 F (36.9 C) (Oral)   Resp 18   Ht 5' 2.5" (1.588 m)   Wt 89.4 kg   SpO2 100%   BMI 35.47 kg/m   Physical Exam Vitals and nursing note reviewed.  Constitutional:       General: She is not in acute distress. HENT:     Head: Normocephalic and atraumatic.     Comments: No significant swelling or trauma evident to head.  There is some cranial tenderness to the very top of the crown of the head.    Nose: Nose normal.  Eyes:     General: No scleral icterus. Cardiovascular:     Rate and Rhythm: Normal rate and regular rhythm.     Pulses: Normal pulses.     Heart sounds: Normal heart sounds.  Pulmonary:     Effort: Pulmonary effort is normal. No respiratory distress.     Breath sounds: No wheezing.  Musculoskeletal:     Cervical back: Normal range of motion.     Right lower leg: No edema.     Left lower leg: No edema.  Skin:    General: Skin is  warm and dry.     Capillary Refill: Capillary refill takes less than 2 seconds.  Neurological:     Mental Status: She is alert. Mental status is at baseline.     Comments: Alert and oriented to self, place, time and event.   Speech is fluent, clear without dysarthria or dysphasia.   Strength 5/5 in upper/lower extremities   Sensation intact in upper/lower extremities   Normal gait.  Normal finger-to-nose and feet tapping.  CN I not tested  CN II grossly intact visual fields bilaterally. Did not visualize posterior eye.  CN III, IV, VI PERRLA and EOMs intact bilaterally  CN V Intact sensation to sharp and light touch to the face  CN VII facial movements symmetric  CN VIII not tested  CN IX, X no uvula deviation, symmetric rise of soft palate  CN XI 5/5 SCM and trapezius strength bilaterally  CN XII Midline tongue protrusion, symmetric L/R movements   Psychiatric:        Mood and Affect: Mood normal.        Behavior: Behavior normal.    ED Results / Procedures / Treatments   Labs (all labs ordered are listed, but only abnormal results are displayed) Labs Reviewed - No data to display  EKG None  Radiology No results found.  Procedures Procedures   Medications Ordered in ED Medications - No  data to display  ED Course  I have reviewed the triage vital signs and the nursing notes.  Pertinent labs & imaging results that were available during my care of the patient were reviewed by me and considered in my medical decision making (see chart for details).    MDM Rules/Calculators/A&P                          Patient is a very low mechanism head injury with curtain pull.  Normal physical exam normal neurologic exam.  Overall very well-appearing.  Will discharge home.  Return precautions given.   Final Clinical Impression(s) / ED Diagnoses Final diagnoses:  Injury of head, initial encounter    Rx / DC Orders ED Discharge Orders     None        Tedd Sias, [place] 10/10/20 1617    Malvin Johns, MD 10/10/20 1754

## 2020-10-10 NOTE — Discharge Instructions (Addendum)
You may always return to the ER for any new or concerning symptoms as discussed.  Please follow-up with your primary care doctor for your regularly scheduled exams.  Please use Tylenol or ibuprofen for pain.  You may use 600 mg ibuprofen every 6 hours or 1000 mg of Tylenol every 6 hours.  You may choose to alternate between the 2.  This would be most effective.  Not to exceed 4 g of Tylenol within 24 hours.  Not to exceed 3200 mg ibuprofen 24 hours.

## 2020-10-10 NOTE — ED Triage Notes (Signed)
A curtain rod fell on top of her head. No LOC. Headache. She does not take blood thinners. C/o slight headache.

## 2020-10-30 ENCOUNTER — Other Ambulatory Visit: Payer: Self-pay | Admitting: Family

## 2020-11-14 ENCOUNTER — Other Ambulatory Visit: Payer: Self-pay | Admitting: Internal Medicine

## 2020-11-25 ENCOUNTER — Other Ambulatory Visit: Payer: Self-pay

## 2020-11-25 ENCOUNTER — Ambulatory Visit (INDEPENDENT_AMBULATORY_CARE_PROVIDER_SITE_OTHER): Payer: Medicare Other | Admitting: Internal Medicine

## 2020-11-25 ENCOUNTER — Encounter: Payer: Self-pay | Admitting: Internal Medicine

## 2020-11-25 VITALS — BP 124/78 | HR 65 | Temp 98.1°F | Resp 18 | Ht 62.5 in | Wt 204.4 lb

## 2020-11-25 DIAGNOSIS — I10 Essential (primary) hypertension: Secondary | ICD-10-CM

## 2020-11-25 DIAGNOSIS — E118 Type 2 diabetes mellitus with unspecified complications: Secondary | ICD-10-CM

## 2020-11-25 DIAGNOSIS — E538 Deficiency of other specified B group vitamins: Secondary | ICD-10-CM | POA: Diagnosis not present

## 2020-11-25 DIAGNOSIS — R5383 Other fatigue: Secondary | ICD-10-CM

## 2020-11-25 DIAGNOSIS — E039 Hypothyroidism, unspecified: Secondary | ICD-10-CM

## 2020-11-25 DIAGNOSIS — E559 Vitamin D deficiency, unspecified: Secondary | ICD-10-CM

## 2020-11-25 LAB — COMPREHENSIVE METABOLIC PANEL
ALT: 22 U/L (ref 0–35)
AST: 17 U/L (ref 0–37)
Albumin: 4.1 g/dL (ref 3.5–5.2)
Alkaline Phosphatase: 81 U/L (ref 39–117)
BUN: 13 mg/dL (ref 6–23)
CO2: 29 mEq/L (ref 19–32)
Calcium: 9.3 mg/dL (ref 8.4–10.5)
Chloride: 101 mEq/L (ref 96–112)
Creatinine, Ser: 0.8 mg/dL (ref 0.40–1.20)
GFR: 76.63 mL/min (ref >60.00)
Glucose, Bld: 99 mg/dL (ref 70–99)
Potassium: 4.2 mEq/L (ref 3.5–5.1)
Sodium: 138 mEq/L (ref 135–145)
Total Bilirubin: 0.6 mg/dL (ref 0.2–1.2)
Total Protein: 7.4 g/dL (ref 6.0–8.3)

## 2020-11-25 LAB — MAGNESIUM: Magnesium: 1.9 mg/dL (ref 1.5–2.5)

## 2020-11-25 LAB — CBC
HCT: 40.6 % (ref 36.0–46.0)
Hemoglobin: 13.6 g/dL (ref 12.0–15.0)
MCHC: 33.5 g/dL (ref 30.0–36.0)
MCV: 83 fl (ref 78.0–100.0)
Platelets: 275 10*3/uL (ref 150.0–400.0)
RBC: 4.9 Mil/uL (ref 3.87–5.11)
RDW: 14 % (ref 11.5–15.5)
WBC: 9.8 10*3/uL (ref 4.0–10.5)

## 2020-11-25 LAB — VITAMIN B12: Vitamin B-12: 344 pg/mL (ref 211–911)

## 2020-11-25 LAB — HEMOGLOBIN A1C: Hgb A1c MFr Bld: 7.3 % — ABNORMAL HIGH (ref 4.6–6.5)

## 2020-11-25 LAB — VITAMIN D 25 HYDROXY (VIT D DEFICIENCY, FRACTURES): VITD: 28.68 ng/mL — ABNORMAL LOW (ref 30.00–100.00)

## 2020-11-25 LAB — TSH: TSH: 2.68 u[IU]/mL (ref 0.35–5.50)

## 2020-11-25 NOTE — Patient Instructions (Signed)
We will check the labs and the covid-19 test today.

## 2020-11-25 NOTE — Progress Notes (Signed)
   Subjective:   Patient ID: Katrina [name], female    DOB: 14-Jul-1955, 65 y.o.   MRN: [id number]  HPI The patient is a 65 YO female coming in for problems with taking BP medication stopped taking the medicine awhile ago due to pain. Fatigue last week and not sure if she had covid-19 wants re-testing today but feeling better.   Review of Systems  Constitutional:  Positive for fatigue.  HENT: Negative.    Eyes: Negative.   Respiratory:  Negative for cough, chest tightness and shortness of breath.   Cardiovascular:  Negative for chest pain, palpitations and leg swelling.  Gastrointestinal:  Negative for abdominal distention, abdominal pain, constipation, diarrhea, nausea and vomiting.  Musculoskeletal:  Positive for myalgias.  Skin: Negative.   Neurological: Negative.   Psychiatric/Behavioral: Negative.     Objective:  Physical Exam Constitutional:      Appearance: She is well-developed. She is obese.  HENT:     Head: Normocephalic and atraumatic.  Cardiovascular:     Rate and Rhythm: Normal rate and regular rhythm.  Pulmonary:     Effort: Pulmonary effort is normal. No respiratory distress.     Breath sounds: Normal breath sounds. No wheezing or rales.  Abdominal:     General: Bowel sounds are normal. There is no distension.     Palpations: Abdomen is soft.     Tenderness: There is no abdominal tenderness. There is no rebound.  Musculoskeletal:     Cervical back: Normal range of motion.  Skin:    General: Skin is warm and dry.  Neurological:     Mental Status: She is alert and oriented to person, place, and time.     Coordination: Coordination normal.    Vitals:   11/25/20 1507  BP: 124/78  Pulse: 65  Resp: 18  Temp: 98.1 F (36.7 C)  TempSrc: Oral  SpO2: 96%  Weight: 204 lb 6.4 oz (92.7 kg)  Height: 5' 2.5" (1.588 m)    This visit occurred during the SARS-CoV-2 public health emergency.  Safety protocols were in place, including screening questions prior to the  visit, additional usage of staff PPE, and extensive cleaning of exam room while observing appropriate contact time as indicated for disinfecting solutions.   Assessment & Plan:

## 2020-11-26 LAB — URINALYSIS, ROUTINE W REFLEX MICROSCOPIC
Bilirubin Urine: NEGATIVE
Hgb urine dipstick: NEGATIVE
Ketones, ur: NEGATIVE
Nitrite: NEGATIVE
RBC / HPF: NONE SEEN (ref 0–?)
Specific Gravity, Urine: 1.01 (ref 1.000–1.030)
Total Protein, Urine: NEGATIVE
Urine Glucose: NEGATIVE
Urobilinogen, UA: 0.2 (ref 0.0–1.0)
pH: 7 (ref 5.0–8.0)

## 2020-11-27 LAB — SPECIMEN STATUS REPORT

## 2020-11-27 LAB — SARS-COV-2, NAA 2 DAY TAT

## 2020-11-27 LAB — NOVEL CORONAVIRUS, NAA: SARS-CoV-2, NAA: NOT DETECTED

## 2020-11-28 ENCOUNTER — Encounter: Payer: Self-pay | Admitting: Internal Medicine

## 2020-11-28 DIAGNOSIS — R5383 Other fatigue: Secondary | ICD-10-CM | POA: Insufficient documentation

## 2020-11-28 NOTE — Assessment & Plan Note (Signed)
BP is at goal today despite the fact that she stopped the losartan 50 mg daily and hctz 25 mg daily (which she had been on for awhile). Will need close monitoring as previously not at goal.

## 2020-11-28 NOTE — Assessment & Plan Note (Signed)
Checking HgA1c and adjust as needed. Diet controlled currently. Not on ACE-I/ARB or statin.

## 2020-11-28 NOTE — Assessment & Plan Note (Signed)
Checking TSH and adjust as needed her medication.

## 2020-11-28 NOTE — Assessment & Plan Note (Addendum)
Checking thyroid, CBC, CMP, vitamin B12 and D. Checking covid-19 although if she was sick last week this would not be an accurate predictor if she had covid-19 or not.

## 2020-12-01 ENCOUNTER — Other Ambulatory Visit: Payer: Self-pay

## 2020-12-01 ENCOUNTER — Encounter: Payer: Self-pay | Admitting: Internal Medicine

## 2020-12-01 ENCOUNTER — Ambulatory Visit (INDEPENDENT_AMBULATORY_CARE_PROVIDER_SITE_OTHER): Payer: Medicare Other | Admitting: Internal Medicine

## 2020-12-01 VITALS — BP 140/92 | HR 61 | Ht 62.5 in | Wt 202.4 lb

## 2020-12-01 DIAGNOSIS — G4710 Hypersomnia, unspecified: Secondary | ICD-10-CM

## 2020-12-01 DIAGNOSIS — I10 Essential (primary) hypertension: Secondary | ICD-10-CM

## 2020-12-01 DIAGNOSIS — R5383 Other fatigue: Secondary | ICD-10-CM | POA: Diagnosis not present

## 2020-12-01 DIAGNOSIS — R0602 Shortness of breath: Secondary | ICD-10-CM | POA: Diagnosis not present

## 2020-12-01 DIAGNOSIS — Z6836 Body mass index (BMI) 36.0-36.9, adult: Secondary | ICD-10-CM

## 2020-12-01 DIAGNOSIS — Z01812 Encounter for preprocedural laboratory examination: Secondary | ICD-10-CM

## 2020-12-01 DIAGNOSIS — R072 Precordial pain: Secondary | ICD-10-CM

## 2020-12-01 DIAGNOSIS — Z9189 Other specified personal risk factors, not elsewhere classified: Secondary | ICD-10-CM

## 2020-12-01 NOTE — Patient Instructions (Signed)
Medication Instructions:  RESUME losartan  *If you need a refill on your cardiac medications before your next appointment, please call your pharmacy*   Lab Work: BMET - non-fasting lab test - to be complete a few days before your CT test at a [facility] location   If you have labs (blood work) drawn today and your tests are completely normal, you will receive your results only by: [place] (if you have MyChart) OR A paper copy in the mail If you have any lab test that is abnormal or we need to change your treatment, we will call you to review the results.   Testing/Procedures: Sleep Study -- our office sleep coordinator Mariann Laster will contact you to set up the appointment    Your cardiac CT will be scheduled at one of the below locations:   [facility] [address] [place], [place] [address] [phone]  [place] [address] [place],  [address] [phone]  If scheduled at [facility], please arrive at the [facility] main entrance (entrance A) of [facility] 30 minutes prior to test start time. Proceed to the [facility] Radiology Department (first floor) to check-in and test prep.  If scheduled at [facility], please arrive 15 mins early for check-in and test prep.  Please follow these instructions carefully (unless otherwise directed):  Hold all erectile dysfunction medications at least 3 days (72 hrs) prior to test.  On the Night Before the Test: Be sure to Drink plenty of water. Do not consume any caffeinated/decaffeinated beverages or chocolate 12 hours prior to your test. Do not take any antihistamines 12 hours prior to your test.  On the Day of the Test: Drink plenty of water until 1 hour prior to the test. Do not eat any food 4 hours prior to the test. You may take your regular medications prior to the test.  HOLD  Furosemide/Hydrochlorothiazide morning of the test. FEMALES- please wear underwire-free bra if available, avoid dresses & tight clothing      After the Test: Drink plenty of water. After receiving IV contrast, you may experience a mild flushed feeling. This is normal. On occasion, you may experience a mild rash up to 24 hours after the test. This is not dangerous. If this occurs, you can take Benadryl 25 mg and increase your fluid intake. If you experience trouble breathing, this can be serious. If it is severe call 911 IMMEDIATELY. If it is mild, please call our office. If you take any of these medications: Glipizide/Metformin, Avandament, Glucavance, please do not take 48 hours after completing test unless otherwise instructed.  Please allow 2-4 weeks for scheduling of routine cardiac CTs. Some insurance companies require a pre-authorization which may delay scheduling of this test.   For non-scheduling related questions, please contact the cardiac imaging nurse navigator should you have any questions/concerns: Marchia Bond, Cardiac Imaging Nurse Navigator Gordy Clement, Cardiac Imaging Nurse Navigator [facility] Direct Office Dial: [phone]   For scheduling needs, including cancellations and rescheduling, please call [place], [phone].    Follow-Up: At [facility], you and your health needs are our priority.  As part of our continuing mission to provide you with exceptional heart care, we have created designated Provider Care Teams.  These Care Teams include your primary Cardiologist (physician) and Advanced Practice Providers (APPs -  Physician Assistants and Nurse Practitioners) who all work together to provide  you with the care you need, when you need it.  We recommend signing up for the patient portal called "MyChart".  Sign up information is provided on this After Visit Summary.  MyChart is used to connect with patients for Virtual Visits  (Telemedicine).  Patients are able to view lab/test results, encounter notes, upcoming appointments, etc.  Non-urgent messages can be sent to your provider as well.   To learn more about what you can do with MyChart, go to [email or web address].    Your next appointment:   6-8 week(s) - after testing  The format for your next appointment:   In Person  Provider:   You may see Pixie Casino, MD or one of the following Advanced Practice Providers on your designated Care Team:   Almyra Deforest, PA-C Fabian Sharp, PA-C or  Roby Lofts, [place]   Other Instructions

## 2020-12-01 NOTE — Progress Notes (Signed)
OFFICE CONSULT NOTE  Chief Complaint:  Chest pain, fatigue, shortness of breath  Primary Care Physician: Hoyt Koch, MD  HPI:  Katrina [name] is a 65 y.o. female who is being seen today for the evaluation of chest pain, fatigue, shortness of breath at the request of Marrian Salvage,*.  This is a pleasant 65 year old female kindly referred for evaluation of fatigue and shortness of breath as well as intermittent chest pain.  Past medical history significant for hypertension, possible sarcoidosis however biopsy did not confirm this, thyroid disease, and recent diagnosis of diabetes based on A1c of 7.3 (not on medication).  She had previously seen Dr. Einar Gip in 2020.  At the time she had shortness of breath and underwent an echocardiogram and plan was for stress testing however that never occurred due to the [place] pandemic.  Echocardiogram demonstrated an LVEF of 65%, normal wall motion and normal diastolic filling.  There is mild tricuspid regurgitation and a normal RVSP of 30 mmHg.  More recently she has been helping her daughter with her wedding.  She also noted that when walking upstairs or doing activities with some exertion she became short of breath.  She also felt fatigue and heaviness in her legs.  She reports that she sleeps well at night but does feel somewhat fatigued in the morning and generally needs to take naps during the day.  She has been told previously that she has some sleep disordered breathing issues by her daughter including snoring.  An EPWSS was performed today in the office and she scored 19 indicating significant chance of dozing while doing multiple activities, being told that she stops breathing or snores at night, waking up frequently to urinate, feeling excessively sleeping during the day and awakening with headaches.  In addition to her personal cardiovascular risk factors there is heart disease in her family including several brothers and father.  Her  brothers had heart disease in their early 82s and her father I believe in his 59s.  PMHx:  Past Medical History:  Diagnosis Date   BCC (basal cell carcinoma of skin) Infundibulocystic 03/11/2008   Inner Left Eye   Chest pain at rest 11/07/2012   Diabetes mellitus without complication (HCC)    Eosinophilic esophagitis 83/41/9622   Esophageal stricture    Exercise-induced asthma    GERD (gastroesophageal reflux disease)    Hx of colonic polyps 03/2020   Hypertension 09/27/2017   Hypothyroidism    Sarcoidosis    Shortness of breath 11/08/2012   RECENTLY  & WHEN i EXERCISE"   Solar lentigo 03/11/2008   Back - Atypical   Thyroid disease    Upper airway cough syndrome     Past Surgical History:  Procedure Laterality Date   ABDOMINAL HYSTERECTOMY     Partial   ESOPHAGOGASTRODUODENOSCOPY (EGD) WITH ESOPHAGEAL DILATION N/A 11/10/2012   Procedure: ESOPHAGOGASTRODUODENOSCOPY (EGD) WITH ESOPHAGEAL DILATION;  Surgeon: Gatha Mayer, MD;  Location: [facility];  Service: Endoscopy;  Laterality: N/A;  [place] vs. Balloon   VIDEO BRONCHOSCOPY WITH ENDOBRONCHIAL ULTRASOUND Bilateral 01/15/2015   Procedure: VIDEO BRONCHOSCOPY WITH ENDOBRONCHIAL ULTRASOUND of  LEFT LUNG LOWER LOBE and right lung;  Surgeon: Collene Gobble, MD;  Location: [facility];  Service: Thoracic;  Laterality: Bilateral;    FAMHx:  Family History  Problem Relation Age of Onset   Diabetes Mother    Stroke Mother    Coronary artery disease Father    Coronary artery disease Brother    Hypothyroidism Maternal  Grandmother    Diabetes Maternal Grandfather    Heart disease Paternal Grandmother    Heart disease Paternal Grandfather     SOCHx:   reports that she has never smoked. She has never used smokeless tobacco. She reports that she does not drink alcohol and does not use drugs.  ALLERGIES:  Allergies  Allergen Reactions   Penicillins Shortness Of Breath and Rash    DID THE REACTION INVOLVE: Swelling of the  face/tongue/throat, SOB, or low BP? N Sudden or severe rash/hives, skin peeling, or the inside of the mouth or nose? Yes Did it require medical treatment? No When did it last happen?   2010  If all above answers are "NO", may proceed with cephalosporin use.    Amlodipine     Hives Other reaction(s): Other (See Comments) Hives   Lisinopril     Weakness/ muscle aches   Metformin And Related     Elevated heart rate   Bactrim Ds [Sulfamethoxazole-Trimethoprim] Rash    Upper body and Face redness with heat.     Metformin Palpitations    Elevated heart rate    ROS: Pertinent items noted in HPI and remainder of comprehensive ROS otherwise negative.  HOME MEDS: Current Outpatient Medications on File Prior to Visit  Medication Sig Dispense Refill   albuterol (VENTOLIN HFA) 108 (90 Base) MCG/ACT inhaler INHALE 1 TO 2 PUFFS INTO THE LUNGS EVERY 6 HOURS AS NEEDED FOR WHEEZING OR SHORTNESS OF BREATH 6.7 g 1   aspirin-acetaminophen-caffeine (EXCEDRIN MIGRAINE) 250-250-65 MG tablet Take 1 tablet by mouth every 6 (six) hours as needed for headache.     blood glucose meter kit and supplies KIT Dispense based on patient and insurance preference. 1 each 0   ibuprofen (ADVIL) 200 MG tablet Take 200 mg by mouth every 6 (six) hours as needed for headache or mild pain.     levothyroxine (SYNTHROID) 175 MCG tablet TAKE 1 TABLET(175 MCG) BY MOUTH DAILY BEFORE AND BREAKFAST 90 tablet 0   losartan (COZAAR) 50 MG tablet Take 50 mg by mouth daily.     omeprazole (PRILOSEC) 20 MG capsule Take 20 mg by mouth daily.     No current facility-administered medications on file prior to visit.    LABS/IMAGING: No results found for this or any previous visit (from the past 48 hour(s)). No results found.  LIPID PANEL:    Component Value Date/Time   CHOL 141 08/22/2018 1217   TRIG 268.0 (H) 08/22/2018 1217   HDL 27.00 (L) 08/22/2018 1217   CHOLHDL 5 08/22/2018 1217   VLDL 53.6 (H) 08/22/2018 1217    LDLDIRECT 83.0 08/22/2018 1217    WEIGHTS: Wt Readings from Last 3 Encounters:  12/01/20 202 lb 6.4 oz (91.8 kg)  11/25/20 204 lb 6.4 oz (92.7 kg)  10/10/20 197 lb 1.5 oz (89.4 kg)    VITALS: BP (!) 140/92   Pulse 61   Ht 5' 2.5" (1.588 m)   Wt 202 lb 6.4 oz (91.8 kg)   SpO2 97%   BMI 36.43 kg/m   EXAM: General appearance: alert, no distress, and moderately obese Neck: no carotid bruit, no JVD, thyroid not enlarged, symmetric, no tenderness/mass/nodules, and thick neck Lungs: clear to auscultation bilaterally Heart: regular rate and rhythm, S1, S2 normal, no murmur, click, rub or gallop Abdomen: soft, non-tender; bowel sounds normal; no masses,  no organomegaly and obese Extremities: extremities normal, atraumatic, no cyanosis or edema Pulses: 2+ and symmetric Skin: Skin color, texture, turgor normal. No  rashes or lesions Neurologic: Grossly normal Psych: Pleasant  EKG: Normal sinus rhythm at 61, possible left atrial enlargement- personally reviewed  ASSESSMENT: Chest pain/dyspnea on exertion/fatigue Suspect OSA-EPWSS 19 Moderate obesity Uncontrolled hypertension Type 2 diabetes-A1c 7.3% Family history of coronary disease in multiple members  PLAN: 1.   Ms. Stanard has a number of concerns including worsening dyspnea on exertion and fatigue and intermittent chest discomfort.  She did have an echocardiogram which appeared fairly normal back in 2020.  She has multiple cardiovascular risk factors but never had ischemia evaluation.  She has a low heart rate at rest and I think is a good candidate for a coronary CT angiogram.  This would help to rule in or rule out any obstructive coronary disease.  In addition it would give [place] an evaluation of her lungs for which there have been question of possible sarcoidosis in the past.  More concerning, is that she has symptoms of fatigue and weakness as well as uncontrolled hypertension and multiple risk factors concerning for obstructive  sleep apnea.  She had a high EPWSS and therefore I will refer her for sleep study.  She recently had labs through her PCP showing an A1c of 7.3 which would be diagnostic for diabetes although she claimed that she was not told she had any diabetes.  She had previously been on metformin but said it caused her some muscle aches.  I encouraged her to follow-up with her PCP to consider other treatment options and of course weight loss and dietary changes are necessary to treat this.  Finally, she has uncontrolled hypertension today.  She stopped taking her losartan and hydrochlorothiazide because she did not feel well on it.  Advised her to go back on just the losartan 50 mg daily which she has and monitor blood pressure.  Plan follow-up with me afterwards.  Thanks again for the kind referral.  Pixie Casino, MD, FACC, [place] Director of the [facility]  [facility] Diplomate of the American Board of Clinical Lipidology Attending Cardiologist  Direct Dial: [phone]  Fax: [phone]  Website:  [email or web address] [email or web address] Kaimen Peine 12/01/2020, 12:22 PM

## 2020-12-08 ENCOUNTER — Telehealth (HOSPITAL_COMMUNITY): Payer: Self-pay | Admitting: Emergency Medicine

## 2020-12-08 ENCOUNTER — Telehealth: Payer: Self-pay | Admitting: *Deleted

## 2020-12-08 NOTE — Telephone Encounter (Signed)
Reaching out to patient to offer assistance regarding upcoming cardiac imaging study; pt verbalizes understanding of appt date/time, parking situation and where to check in, pre-test NPO status and medications ordered, and verified current allergies; name and call back number provided for further questions should they arise Marchia Bond RN [facility] [phone] office [phone] cell  Denies iv issues Some claustro Daily meds (resting HR 60s) Clarise Cruz

## 2020-12-08 NOTE — Telephone Encounter (Signed)
-----   Message from Fidel Levy, RN sent at 12/01/2020 11:48 AM EDT ----- Regarding: sleep study Hello -   This patient needs a sleep stud EPSS = 19 Hypersomnolence Snoring Fatigue Obesity

## 2020-12-08 NOTE — Telephone Encounter (Signed)
Attempted to call patient regarding upcoming cardiac CT appointment. °Left message on voicemail with name and callback number °Sequoyah Counterman RN Navigator Cardiac Imaging °[facility] °[phone] Office °[phone] Cell ° °

## 2020-12-08 NOTE — Telephone Encounter (Signed)
Patient notified of sleep study appointment details.

## 2020-12-09 ENCOUNTER — Telehealth: Payer: Self-pay | Admitting: Internal Medicine

## 2020-12-09 NOTE — Telephone Encounter (Signed)
I'm not sure she needs to start this. Her HgA1c is 7.3 which is at goal for her diabetes of <8. We can have visit to discuss more if desired otherwise just follow up 3-6 months from last visit is fine.

## 2020-12-09 NOTE — Telephone Encounter (Signed)
   Patient saw Dr Debara Pickett 8/15, she states he is requesting that she start Metformin again. Patient did not verbalize any objections Patient seeking advice/prescription for restarting med   Please advise

## 2020-12-09 NOTE — Telephone Encounter (Signed)
See below

## 2020-12-10 ENCOUNTER — Other Ambulatory Visit: Payer: Self-pay

## 2020-12-10 ENCOUNTER — Ambulatory Visit (HOSPITAL_COMMUNITY)
Admission: RE | Admit: 2020-12-10 | Discharge: 2020-12-10 | Disposition: A | Discharge status: Home / Self Care | Payer: Medicare Other | Source: Ambulatory Visit | Attending: Internal Medicine | Admitting: Internal Medicine

## 2020-12-10 DIAGNOSIS — Z6836 Body mass index (BMI) 36.0-36.9, adult: Secondary | ICD-10-CM | POA: Insufficient documentation

## 2020-12-10 DIAGNOSIS — R072 Precordial pain: Secondary | ICD-10-CM | POA: Diagnosis not present

## 2020-12-10 DIAGNOSIS — R0602 Shortness of breath: Secondary | ICD-10-CM | POA: Diagnosis present

## 2020-12-10 MED ORDER — METOPROLOL TARTRATE 5 MG/5ML IV SOLN
INTRAVENOUS | Status: AC
  Filled 2020-12-10: qty 20

## 2020-12-10 MED ORDER — METOPROLOL TARTRATE 5 MG/5ML IV SOLN: 10.0000 mg | INTRAVENOUS | Status: DC | PRN

## 2020-12-10 MED ORDER — NITROGLYCERIN 0.4 MG SL SUBL
SUBLINGUAL_TABLET | SUBLINGUAL | Status: AC
  Filled 2020-12-10: qty 2

## 2020-12-10 MED ORDER — IOHEXOL 350 MG/ML SOLN
100.0000 mL | Freq: Once | INTRAVENOUS | Status: AC | PRN
  Administered 2020-12-10: 100 mL via INTRAVENOUS

## 2020-12-10 MED ORDER — NITROGLYCERIN 0.4 MG SL SUBL: 0.8000 mg | SUBLINGUAL_TABLET | Freq: Once | SUBLINGUAL | Status: DC

## 2020-12-10 NOTE — Telephone Encounter (Signed)
Spoke with the patient and she verbalized understanding Dr. Nathanial Millman recommendations. She declined a office visit to discuss further. She is going to make a follow up at a later time.

## 2020-12-24 ENCOUNTER — Telehealth: Payer: Self-pay | Admitting: Internal Medicine

## 2020-12-24 NOTE — Telephone Encounter (Signed)
Patient says she is having trouble keeping BP down  Patient says she is taking losartan (COZAAR) 50 MG tablet as prescribed but does not feel like it is working 100%  Please fu w/ patient [phone]

## 2020-12-25 ENCOUNTER — Telehealth: Payer: Self-pay | Admitting: Internal Medicine

## 2020-12-25 NOTE — Telephone Encounter (Signed)
Katrina [name] contacted our office today regarding concerns about her blood pressure and blood glucose. It appears that she contacted her PCP as well and was told that she does not need therapy for her blood glucose if the A1c is <8.0% (telephone note on 12/09/2020). Katrina [name] said she was not comfortable with this and asked for options or whether she should see a specialist for diabetes. As for blood pressure, her PCP has recommended increasing her losartan today to 100 mg.   She was appreciative of the recommendations - I encouraged her to continue to work with her PCP on this. Her calcium score was zero and no coronary disease was noted on cardiac CT, which is reassuring. A sleep study is pending.  Dr. Debara Pickett

## 2020-12-25 NOTE — Telephone Encounter (Signed)
We can increase to 100 mg daily losartan or she can try to ask pharmacy what manufacturer hers was and then call around to pharmacies to see if they stock that manufacturer.

## 2020-12-25 NOTE — Telephone Encounter (Signed)
Spoke with the patient and she verbalized understanding the recommendations given by Dr. Sharlet Salina. No other questions or concerns.

## 2020-12-25 NOTE — Telephone Encounter (Signed)
Spoke with the patient and she stated that her first elevated reading was 150/72. This morning her blood pressure was 156/83. She feels that her Losartan isn't working like it did before because the manufacture has since changed. Denies any chest pain, headache or shortness of breath.

## 2020-12-26 ENCOUNTER — Ambulatory Visit: Payer: Medicare Other

## 2020-12-26 ENCOUNTER — Encounter: Payer: Medicare Other | Admitting: Internal Medicine

## 2020-12-29 ENCOUNTER — Ambulatory Visit (INDEPENDENT_AMBULATORY_CARE_PROVIDER_SITE_OTHER): Payer: Medicare Other

## 2020-12-29 ENCOUNTER — Other Ambulatory Visit: Payer: Self-pay

## 2020-12-29 VITALS — BP 132/80 | HR 69 | Temp 98.3°F | Ht 65.0 in | Wt 203.2 lb

## 2020-12-29 DIAGNOSIS — Z0000 Encounter for general adult medical examination without abnormal findings: Secondary | ICD-10-CM

## 2020-12-29 NOTE — Patient Instructions (Signed)
Katrina [name] , Thank you for taking time to come for your Medicare Wellness Visit. I appreciate your ongoing commitment to your health goals. Please review the following plan we discussed and let me know if I can assist you in the future.   Screening recommendations/referrals: Colonoscopy: 04/02/2020; due every 7 years Mammogram: 08/01/2020; due every 1-2 years Bone Density: never done Recommended yearly ophthalmology/optometry visit for glaucoma screening and checkup Recommended yearly dental visit for hygiene and checkup  Vaccinations: Influenza vaccine: declined Pneumococcal vaccine: declined Tdap vaccine: declined Shingles vaccine: declined   Covid-19: 05/10/2019, 05/31/2019, 03/29/2020  Advanced directives: Advance directive discussed with you today. I have provided a copy for you to complete at home and have notarized. Once this is complete please bring a copy in to our office so we can scan it into your chart.  Conditions/risks identified: Yes; Client understands the importance of follow-up with providers by attending scheduled visits and discussed goals to eat healthier, increase physical activity, exercise the brain, socialize more, get enough sleep and make time for laughter.  Next appointment: Please schedule your next Medicare Wellness Visit with your Nurse Health Advisor in 1 year by calling [phone].   Preventive Care 23 Years and Older, Female Preventive care refers to lifestyle choices and visits with your health care provider that can promote health and wellness. What does preventive care include? A yearly physical exam. This is also called an annual well check. Dental exams once or twice a year. Routine eye exams. Ask your health care provider how often you should have your eyes checked. Personal lifestyle choices, including: Daily care of your teeth and gums. Regular physical activity. Eating a healthy diet. Avoiding tobacco and drug use. Limiting alcohol  use. Practicing safe sex. Taking low-dose aspirin every day. Taking vitamin and mineral supplements as recommended by your health care provider. What happens during an annual well check? The services and screenings done by your health care provider during your annual well check will depend on your age, overall health, lifestyle risk factors, and family history of disease. Counseling  Your health care provider may ask you questions about your: Alcohol use. Tobacco use. Drug use. Emotional well-being. Home and relationship well-being. Sexual activity. Eating habits. History of falls. Memory and ability to understand (cognition). Work and work Statistician. Reproductive health. Screening  You may have the following tests or measurements: Height, weight, and BMI. Blood pressure. Lipid and cholesterol levels. These may be checked every 5 years, or more frequently if you are over 53 years old. Skin check. Lung cancer screening. You may have this screening every year starting at age 22 if you have a 30-pack-year history of smoking and currently smoke or have quit within the past 15 years. Fecal occult blood test (FOBT) of the stool. You may have this test every year starting at age 59. Flexible sigmoidoscopy or colonoscopy. You may have a sigmoidoscopy every 5 years or a colonoscopy every 10 years starting at age 22. Hepatitis C blood test. Hepatitis B blood test. Sexually transmitted disease (STD) testing. Diabetes screening. This is done by checking your blood sugar (glucose) after you have not eaten for a while (fasting). You may have this done every 1-3 years. Bone density scan. This is done to screen for osteoporosis. You may have this done starting at age 38. Mammogram. This may be done every 1-2 years. Talk to your health care provider about how often you should have regular mammograms. Talk with your health care provider about your  test results, treatment options, and if necessary,  the need for more tests. Vaccines  Your health care provider may recommend certain vaccines, such as: Influenza vaccine. This is recommended every year. Tetanus, diphtheria, and acellular pertussis (Tdap, Td) vaccine. You may need a Td booster every 10 years. Zoster vaccine. You may need this after age 20. Pneumococcal 13-valent conjugate (PCV13) vaccine. One dose is recommended after age 95. Pneumococcal polysaccharide (PPSV23) vaccine. One dose is recommended after age 11. Talk to your health care provider about which screenings and vaccines you need and how often you need them. This information is not intended to replace advice given to you by your health care provider. Make sure you discuss any questions you have with your health care provider. Document Released: 05/02/2015 Document Revised: 12/24/2015 Document Reviewed: 02/04/2015 Elsevier Interactive Patient Education  2017 [place] Prevention in the Home Falls can cause injuries. They can happen to people of all ages. There are many things you can do to make your home safe and to help prevent falls. What can I do on the outside of my home? Regularly fix the edges of walkways and driveways and fix any cracks. Remove anything that might make you trip as you walk through a door, such as a raised step or threshold. Trim any bushes or trees on the path to your home. Use bright outdoor lighting. Clear any walking paths of anything that might make someone trip, such as rocks or tools. Regularly check to see if handrails are loose or broken. Make sure that both sides of any steps have handrails. Any raised decks and porches should have guardrails on the edges. Have any leaves, snow, or ice cleared regularly. Use sand or salt on walking paths during winter. Clean up any spills in your garage right away. This includes oil or grease spills. What can I do in the bathroom? Use night lights. Install grab bars by the toilet and in the  tub and shower. Do not use towel bars as grab bars. Use non-skid mats or decals in the tub or shower. If you need to sit down in the shower, use a plastic, non-slip stool. Keep the floor dry. Clean up any water that spills on the floor as soon as it happens. Remove soap buildup in the tub or shower regularly. Attach bath mats securely with double-sided non-slip rug tape. Do not have throw rugs and other things on the floor that can make you trip. What can I do in the bedroom? Use night lights. Make sure that you have a light by your bed that is easy to reach. Do not use any sheets or blankets that are too big for your bed. They should not hang down onto the floor. Have a firm chair that has side arms. You can use this for support while you get dressed. Do not have throw rugs and other things on the floor that can make you trip. What can I do in the kitchen? Clean up any spills right away. Avoid walking on wet floors. Keep items that you use a lot in easy-to-reach places. If you need to reach something above you, use a strong step stool that has a grab bar. Keep electrical cords out of the way. Do not use floor polish or wax that makes floors slippery. If you must use wax, use non-skid floor wax. Do not have throw rugs and other things on the floor that can make you trip. What can I do with my  stairs? Do not leave any items on the stairs. Make sure that there are handrails on both sides of the stairs and use them. Fix handrails that are broken or loose. Make sure that handrails are as long as the stairways. Check any carpeting to make sure that it is firmly attached to the stairs. Fix any carpet that is loose or worn. Avoid having throw rugs at the top or bottom of the stairs. If you do have throw rugs, attach them to the floor with carpet tape. Make sure that you have a light switch at the top of the stairs and the bottom of the stairs. If you do not have them, ask someone to add them for  you. What else can I do to help prevent falls? Wear shoes that: Do not have high heels. Have rubber bottoms. Are comfortable and fit you well. Are closed at the toe. Do not wear sandals. If you use a stepladder: Make sure that it is fully opened. Do not climb a closed stepladder. Make sure that both sides of the stepladder are locked into place. Ask someone to hold it for you, if possible. Clearly mark and make sure that you can see: Any grab bars or handrails. First and last steps. Where the edge of each step is. Use tools that help you move around (mobility aids) if they are needed. These include: Canes. Walkers. Scooters. Crutches. Turn on the lights when you go into a dark area. Replace any light bulbs as soon as they burn out. Set up your furniture so you have a clear path. Avoid moving your furniture around. If any of your floors are uneven, fix them. If there are any pets around you, be aware of where they are. Review your medicines with your doctor. Some medicines can make you feel dizzy. This can increase your chance of falling. Ask your doctor what other things that you can do to help prevent falls. This information is not intended to replace advice given to you by your health care provider. Make sure you discuss any questions you have with your health care provider. Document Released: 01/30/2009 Document Revised: 09/11/2015 Document Reviewed: 05/10/2014 Elsevier Interactive Patient Education  2017 [employer].

## 2020-12-29 NOTE — Progress Notes (Signed)
Subjective:   Katrina [name] is a 65 y.o. female who presents for Medicare Annual (Subsequent) preventive examination.  Review of Systems     Cardiac Risk Factors include: advanced age (>32men, >63 women);diabetes mellitus;dyslipidemia;family history of premature cardiovascular disease;hypertension;obesity (BMI >30kg/m2)     Objective:    Today's Vitals   12/29/20 1040  BP: 132/80  Pulse: 69  Temp: 98.3 F (36.8 C)  SpO2: 94%  Weight: 203 lb 3.2 oz (92.2 kg)  Height: 5\' 5"  (1.651 m)  PainSc: 0-No pain   Body mass index is 33.81 kg/m.  Advanced Directives 12/29/2020 10/10/2020 10/17/2019 12/06/2018 09/24/2018 08/14/2018 05/17/2018  Does Patient Have a Medical Advance Directive? No No No No No No No  Would patient like information on creating a medical advance directive? Yes (MAU/Ambulatory/Procedural Areas - Information given) No - Patient declined No - Patient declined - No - Patient declined - No - Patient declined    Current Medications (verified) Outpatient Encounter Medications as of 12/29/2020  Medication Sig   albuterol (VENTOLIN HFA) 108 (90 Base) MCG/ACT inhaler INHALE 1 TO 2 PUFFS INTO THE LUNGS EVERY 6 HOURS AS NEEDED FOR WHEEZING OR SHORTNESS OF BREATH   aspirin-acetaminophen-caffeine (EXCEDRIN MIGRAINE) 250-250-65 MG tablet Take 1 tablet by mouth every 6 (six) hours as needed for headache.   hydrochlorothiazide (HYDRODIURIL) 25 MG tablet Take 25 mg by mouth daily. Take every three days per cardiology   ibuprofen (ADVIL) 200 MG tablet Take 200 mg by mouth every 6 (six) hours as needed for headache or mild pain.   levothyroxine (SYNTHROID) 175 MCG tablet TAKE 1 TABLET(175 MCG) BY MOUTH DAILY BEFORE AND BREAKFAST   losartan (COZAAR) 50 MG tablet Take 50 mg by mouth daily.   omeprazole (PRILOSEC) 20 MG capsule Take 20 mg by mouth daily.   blood glucose meter kit and supplies KIT Dispense based on patient and insurance preference. (Patient not taking: Reported on 12/29/2020)    No facility-administered encounter medications on file as of 12/29/2020.    Allergies (verified) Penicillins, Amlodipine, Lisinopril, Metformin and related, Bactrim ds [sulfamethoxazole-trimethoprim], and Metformin   History: Past Medical History:  Diagnosis Date   BCC (basal cell carcinoma of skin) Infundibulocystic 03/11/2008   Inner Left Eye   Chest pain at rest 11/07/2012   Diabetes mellitus without complication (HCC)    Eosinophilic esophagitis 11/10/2012   Esophageal stricture    Exercise-induced asthma    GERD (gastroesophageal reflux disease)    Hx of colonic polyps 03/2020   Hypertension 09/27/2017   Hypothyroidism    Sarcoidosis    Shortness of breath 11/08/2012   RECENTLY  & WHEN i EXERCISE"   Solar lentigo 03/11/2008   Back - Atypical   Thyroid disease    Upper airway cough syndrome    Past Surgical History:  Procedure Laterality Date   ABDOMINAL HYSTERECTOMY     Partial   ESOPHAGOGASTRODUODENOSCOPY (EGD) WITH ESOPHAGEAL DILATION N/A 11/10/2012   Procedure: ESOPHAGOGASTRODUODENOSCOPY (EGD) WITH ESOPHAGEAL DILATION;  Surgeon: 11/12/2012, MD;  Location: [facility] ENDOSCOPY;  Service: Endoscopy;  Laterality: N/A;  [place] vs. Balloon   VIDEO BRONCHOSCOPY WITH ENDOBRONCHIAL ULTRASOUND Bilateral 01/15/2015   Procedure: VIDEO BRONCHOSCOPY WITH ENDOBRONCHIAL ULTRASOUND of  LEFT LUNG LOWER LOBE and right lung;  Surgeon: 01/17/2015, MD;  Location: [facility] OR;  Service: Thoracic;  Laterality: Bilateral;   Family History  Problem Relation Age of Onset   Diabetes Mother    Stroke Mother    Coronary artery disease Father  Coronary artery disease Brother    Hypothyroidism Maternal Grandmother    Diabetes Maternal Grandfather    Heart disease Paternal Grandmother    Heart disease Paternal Grandfather    Social History   Socioeconomic History   Marital status: Divorced    Spouse name: Not on file   Number of children: 3   Years of education: 14   Highest education  level: Not on file  Occupational History   Occupation: Unemployed  Tobacco Use   Smoking status: Never   Smokeless tobacco: Never  Vaping Use   Vaping Use: Never used  Substance and Sexual Activity   Alcohol use: No   Drug use: No   Sexual activity: Not on file  Other Topics Concern   Not on file  Social History Narrative   Fun: Anything and everything   Social Determinants of Health   Financial Resource Strain: Low Risk    Difficulty of Paying Living Expenses: Not hard at all  Food Insecurity: No Food Insecurity   Worried About Charity fundraiser in the Last Year: Never true   [place] in the Last Year: Never true  Transportation Needs: No Transportation Needs   Lack of Transportation (Medical): No   Lack of Transportation (Non-Medical): No  Physical Activity: Sufficiently Active   Days of Exercise per Week: 5 days   Minutes of Exercise per Session: 30 min  Stress: No Stress Concern Present   Feeling of Stress : Not at all  Social Connections: Moderately Integrated   Frequency of Communication with Friends and Family: More than three times a week   Frequency of Social Gatherings with Friends and Family: Once a week   Attends Religious Services: More than 4 times per year   Active Member of [employer] or Organizations: Yes   Attends Music therapist: More than 4 times per year   Marital Status: Divorced    Tobacco Counseling Counseling given: Not Answered   Clinical Intake:  Pre-visit preparation completed: Yes  Pain : No/denies pain Pain Score: 0-No pain     BMI - recorded: 33.81 Nutritional Status: BMI > 30  Obese Nutritional Risks: None Diabetes: Yes CBG done?: No Did pt. bring in CBG monitor from home?: No  How often do you need to have someone help you when you read instructions, pamphlets, or other written materials from your doctor or pharmacy?: 1 - Never What is the last grade level you completed in school?: Associate's  Degree  Diabetic? yes  Interpreter Needed?: No  Information entered by :: Lisette Abu, LPN   Activities of Daily Living In your present state of health, do you have any difficulty performing the following activities: 12/29/2020 05/19/2020  Hearing? N N  Vision? N N  Difficulty concentrating or making decisions? N N  Walking or climbing stairs? N N  Dressing or bathing? N N  Doing errands, shopping? N N  Preparing Food and eating ? N -  Using the Toilet? N -  In the past six months, have you accidently leaked urine? N -  Do you have problems with loss of bowel control? N -  Managing your Medications? N -  Managing your Finances? N -  Housekeeping or managing your Housekeeping? N -  Some recent data might be hidden    Patient Care Team: Hoyt Koch, MD as PCP - General (Internal Medicine) Debara Pickett Nadean Corwin, MD as PCP - Cardiology (Cardiology) Rolley Sims, [place] as Consulting Physician (Optometry)  Indicate any recent Medical Services you may have received from other than [facility] providers in the past year (date may be approximate).     Assessment:   This is a routine wellness examination for [place].  Hearing/Vision screen Hearing Screening - Comments:: Patient denied any hearing difficulty. Vision Screening - Comments:: Patient wears readers.  Annual eye exam done by Rolley Sims, O.D.  Dietary issues and exercise activities discussed: Current Exercise Habits: Home exercise routine, Type of exercise: walking;Other - see comments (swimming, tennis), Time (Minutes): 30, Frequency (Times/Week): 5, Weekly Exercise (Minutes/Week): 150, Intensity: Moderate, Exercise limited by: None identified   Goals Addressed               This Visit's Progress     Patient Stated (pt-stated)        My goal is to lose 50-60 pounds.      Depression Screen PHQ 2/9 Scores 12/29/2020 10/01/2020 08/04/2020 05/19/2020 04/20/2017  PHQ - 2 Score 0 0 0 0 0    Fall Risk Fall Risk   12/29/2020 10/01/2020 08/04/2020 07/21/2020 11/26/2019  Falls in the past year? 0 1 0 0 0  Number falls in past yr: 0 0 0 0 0  Injury with Fall? 0 0 0 0 0  Risk for fall due to : No Fall Risks No Fall Risks No Fall Risks - No Fall Risks  Follow up Falls evaluation completed Falls evaluation completed Falls evaluation completed Falls evaluation completed -    FALL RISK PREVENTION PERTAINING TO THE HOME:  Any stairs in or around the home? No  If so, are there any without handrails? No  Home free of loose throw rugs in walkways, pet beds, electrical cords, etc? Yes  Adequate lighting in your home to reduce risk of falls? Yes   ASSISTIVE DEVICES UTILIZED TO PREVENT FALLS:  Life alert? No  Use of a cane, walker or w/c? No  Grab bars in the bathroom? No  Shower chair or bench in shower? No  Elevated toilet seat or a handicapped toilet? No   TIMED UP AND GO:  Was the test performed? Yes .  Length of time to ambulate 10 feet: 7 sec.   Gait steady and fast without use of assistive device  Cognitive Function: Normal cognitive status assessed by direct observation by this Nurse Health Advisor. No abnormalities found.          Immunizations Immunization History  Administered Date(s) Administered   [place] Vaccination 03/29/2020   PFIZER(Purple Top)SARS-COV-2 Vaccination 05/10/2019, 05/31/2019    TDAP status: Due, Education has been provided regarding the importance of this vaccine. Advised may receive this vaccine at local pharmacy or Health Dept. Aware to provide a copy of the vaccination record if obtained from local pharmacy or Health Dept. Verbalized acceptance and understanding. (Patient declined)  Flu Vaccine status: Declined, Education has been provided regarding the importance of this vaccine but patient still declined. Advised may receive this vaccine at local pharmacy or Health Dept. Aware to provide a copy of the vaccination record if obtained from local  pharmacy or Health Dept. Verbalized acceptance and understanding.  Pneumococcal vaccine status: Declined,  Education has been provided regarding the importance of this vaccine but patient still declined. Advised may receive this vaccine at local pharmacy or Health Dept. Aware to provide a copy of the vaccination record if obtained from local pharmacy or Health Dept. Verbalized acceptance and understanding.   Covid-19 vaccine status: Completed vaccines  Qualifies for Shingles Vaccine?  Yes   Zostavax completed No   Shingrix Completed?: No.    Education has been provided regarding the importance of this vaccine. Patient has been advised to call insurance company to determine out of pocket expense if they have not yet received this vaccine. Advised may also receive vaccine at local pharmacy or Health Dept. Verbalized acceptance and understanding.  Screening Tests Health Maintenance  Topic Date Due   OPHTHALMOLOGY EXAM  Never done   COVID-19 Vaccine (4 - Booster for Pfizer series) 07/28/2020   INFLUENZA VACCINE  Never done   Zoster Vaccines- Shingrix (1 of 2) 01/01/2021 (Originally 03/12/2004)   DEXA SCAN  10/01/2021 (Originally 03/13/2019)   TETANUS/TDAP  10/01/2021 (Originally 03/12/1973)   PNA vac Low Risk Adult (1 of 2 - PCV13) 10/01/2021 (Originally 03/13/2019)   HEMOGLOBIN A1C  05/28/2021   FOOT EXAM  10/01/2021   MAMMOGRAM  08/02/2022   COLONOSCOPY (Pts 45-48yrs Insurance coverage will need to be confirmed)  04/03/2027   Hepatitis C Screening  Completed   HPV VACCINES  Aged Out    Health Maintenance  Health Maintenance Due  Topic Date Due   OPHTHALMOLOGY EXAM  Never done   COVID-19 Vaccine (4 - Booster for [place] series) 07/28/2020   INFLUENZA VACCINE  Never done    Colorectal cancer screening: Type of screening: Colonoscopy. Completed 04/02/2020. Repeat every 7 years  Mammogram status: Completed 08/01/2020. Repeat every year  Bone density status: never done  Lung Cancer  Screening: (Low Dose CT Chest recommended if Age 41-80 years, 30 pack-year currently smoking OR have quit w/in 15years.) does not qualify.   Lung Cancer Screening Referral: no  Additional Screening:  Hepatitis C Screening: does qualify; Completed yes  Vision Screening: Recommended annual ophthalmology exams for early detection of glaucoma and other disorders of the eye. Is the patient up to date with their annual eye exam?  Yes  Who is the provider or what is the name of the office in which the patient attends annual eye exams? Rolley Sims, OD. If pt is not established with a provider, would they like to be referred to a provider to establish care? No .   Dental Screening: Recommended annual dental exams for proper oral hygiene  Community Resource Referral / Chronic Care Management: CRR required this visit?  No   CCM required this visit?  No      Plan:     I have personally reviewed and noted the following in the patient's chart:   Medical and social history Use of alcohol, tobacco or illicit drugs  Current medications and supplements including opioid prescriptions.  Functional ability and status Nutritional status Physical activity Advanced directives List of other physicians Hospitalizations, surgeries, and ER visits in previous 12 months Vitals Screenings to include cognitive, depression, and falls Referrals and appointments  In addition, I have reviewed and discussed with patient certain preventive protocols, quality metrics, and best practice recommendations. A written personalized care plan for preventive services as well as general preventive health recommendations were provided to patient.     Sheral Flow, LPN   11/11/2033   Nurse Notes:  Hearing Screening - Comments:: Patient denied any hearing difficulty. Vision Screening - Comments:: Patient wears readers.  Annual eye exam done by Rolley Sims, O.D.

## 2021-01-13 ENCOUNTER — Other Ambulatory Visit: Payer: Self-pay

## 2021-01-13 ENCOUNTER — Other Ambulatory Visit (INDEPENDENT_AMBULATORY_CARE_PROVIDER_SITE_OTHER): Payer: Medicare Other

## 2021-01-13 ENCOUNTER — Ambulatory Visit (INDEPENDENT_AMBULATORY_CARE_PROVIDER_SITE_OTHER): Payer: Medicare Other | Admitting: Internal Medicine

## 2021-01-13 ENCOUNTER — Encounter: Payer: Self-pay | Admitting: Internal Medicine

## 2021-01-13 VITALS — BP 156/88 | HR 69 | Temp 98.4°F | Ht 65.0 in | Wt 207.0 lb

## 2021-01-13 DIAGNOSIS — M25541 Pain in joints of right hand: Secondary | ICD-10-CM

## 2021-01-13 DIAGNOSIS — M25542 Pain in joints of left hand: Secondary | ICD-10-CM

## 2021-01-13 LAB — CBC WITH DIFFERENTIAL/PLATELET
Basophils Absolute: 0.1 10*3/uL (ref 0.0–0.1)
Basophils Relative: 0.9 % (ref 0.0–3.0)
Eosinophils Absolute: 0.3 10*3/uL (ref 0.0–0.7)
Eosinophils Relative: 2.8 % (ref 0.0–5.0)
HCT: 42.5 % (ref 36.0–46.0)
Hemoglobin: 14.3 g/dL (ref 12.0–15.0)
Lymphocytes Relative: 25.2 % (ref 12.0–46.0)
Lymphs Abs: 2.4 10*3/uL (ref 0.7–4.0)
MCHC: 33.6 g/dL (ref 30.0–36.0)
MCV: 82.3 fl (ref 78.0–100.0)
Monocytes Absolute: 0.6 10*3/uL (ref 0.1–1.0)
Monocytes Relative: 6.6 % (ref 3.0–12.0)
Neutro Abs: 6.2 10*3/uL (ref 1.4–7.7)
Neutrophils Relative %: 64.5 % (ref 43.0–77.0)
Platelets: 269 10*3/uL (ref 150.0–400.0)
RBC: 5.16 Mil/uL — ABNORMAL HIGH (ref 3.87–5.11)
RDW: 14.3 % (ref 11.5–15.5)
WBC: 9.7 10*3/uL (ref 4.0–10.5)

## 2021-01-13 LAB — COMPREHENSIVE METABOLIC PANEL
ALT: 20 U/L (ref 0–35)
AST: 17 U/L (ref 0–37)
Albumin: 4.1 g/dL (ref 3.5–5.2)
Alkaline Phosphatase: 87 U/L (ref 39–117)
BUN: 12 mg/dL (ref 6–23)
CO2: 28 mEq/L (ref 19–32)
Calcium: 9.5 mg/dL (ref 8.4–10.5)
Chloride: 100 mEq/L (ref 96–112)
Creatinine, Ser: 0.69 mg/dL (ref 0.40–1.20)
GFR: 90.17 mL/min (ref >60.00)
Glucose, Bld: 104 mg/dL — ABNORMAL HIGH (ref 70–99)
Potassium: 4.1 mEq/L (ref 3.5–5.1)
Sodium: 137 mEq/L (ref 135–145)
Total Bilirubin: 0.4 mg/dL (ref 0.2–1.2)
Total Protein: 7.3 g/dL (ref 6.0–8.3)

## 2021-01-13 LAB — C-REACTIVE PROTEIN: CRP: 2.1 mg/dL (ref 0.5–20.0)

## 2021-01-13 LAB — SEDIMENTATION RATE: Sed Rate: 28 mm/hr (ref 0–30)

## 2021-01-13 MED ORDER — METFORMIN HCL ER 500 MG PO TB24: 500.0000 mg | ORAL_TABLET | Freq: Every day | ORAL | 5 refills | Status: DC

## 2021-01-13 NOTE — Progress Notes (Signed)
Subjective:    Patient ID: Katrina [name], female    DOB: 06/20/1955, 65 y.o.   MRN: [id number]  This visit occurred during the SARS-CoV-2 public health emergency.  Safety protocols were in place, including screening questions prior to the visit, additional usage of staff PPE, and extensive cleaning of exam room while observing appropriate contact time as indicated for disinfecting solutions.    HPI The patient is here for an acute visit.  Numbness/tingling both hands-symptoms came on quickly last week.  She was sleeping and laying on the left side and her left shoulder hurt.  She then switched the right side and her right shoulder.  The pain radiated down towards her wrists and her wrists hurt for a while and then her hands.  The shoulders and wrists are better now, but her hands still hurt.  She denies any activities that would induce this.  Her hands feel swollen and looks more swollen at night.  The whole hands hurt and are numb/tingly.  At night they look a little red and they are itchy and when she wakes up in the morning she has noticed that she scratched the posterior base of her fingers.  She thinks her toes are starting to hurt.  She has noticed that her calfs are cramping which is new for her.  She denies knee or hip pain.  She denies any associated neck pain or headaches.  She denies muscle pain.   She notes her sugars are not controlled and was on metformin in the past and wonders about restarting that.  Medications and allergies reviewed with patient and updated if appropriate.  Patient Active Problem List   Diagnosis Date Noted   Other fatigue 11/28/2020   Asthma exacerbation 06/28/2020   Hx of colonic polyps 03/2020   Piriformis syndrome of right side 10/04/2018   Abnormal electrocardiogram (ECG) (EKG) 08/22/2018   Hyperlipidemia associated with type 2 diabetes mellitus ([place]) 08/22/2018   Type 2 diabetes with complication ([place]) 57/84/6962   Esophageal stricture     Hypertension 09/27/2017   Cough variant asthma vs UACS 01/18/2017   Low back pain associated with a spinal disorder other than radiculopathy or spinal stenosis 03/01/2016   Myalgia 10/09/2015   Upper airway cough syndrome 01/26/2015   Morbid obesity ([place]) 01/26/2015   Pulmonary nodules    Pulmonary sarcoidosis (HCC)    Mediastinal lymphadenopathy 12/26/2014   Esophageal ring, acquired 11/10/2012   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     hydrochlorothiazide (HYDRODIURIL) 25 MG tablet Take 25 mg by mouth daily. Take every three days per cardiology     ibuprofen (ADVIL) 200 MG tablet Take 200 mg by mouth every 6 (six) hours as needed for headache or mild pain.     levothyroxine (SYNTHROID) 175 MCG tablet TAKE 1 TABLET(175 MCG) BY MOUTH DAILY BEFORE AND BREAKFAST 90 tablet 0   losartan (COZAAR) 50 MG tablet Take 50 mg by mouth 2 (two) times daily.  omeprazole (PRILOSEC) 20 MG capsule Take 20 mg by mouth daily.     blood glucose meter kit and supplies KIT Dispense based on patient and insurance preference. (Patient not taking: No sig reported) 1 each 0   No current facility-administered medications on file prior to visit.    Past Medical History:  Diagnosis Date   BCC (basal cell carcinoma of skin) Infundibulocystic 03/11/2008   Inner Left Eye   Chest pain at rest 11/07/2012   Diabetes mellitus without complication (HCC)    Eosinophilic esophagitis 57/04/7791    Esophageal stricture    Exercise-induced asthma    GERD (gastroesophageal reflux disease)    Hx of colonic polyps 03/2020   Hypertension 09/27/2017   Hypothyroidism    Sarcoidosis    Shortness of breath 11/08/2012   RECENTLY  & WHEN i EXERCISE"   Solar lentigo 03/11/2008   Back - Atypical   Thyroid disease    Upper airway cough syndrome     Past Surgical History:  Procedure Laterality Date   ABDOMINAL HYSTERECTOMY     Partial   ESOPHAGOGASTRODUODENOSCOPY (EGD) WITH ESOPHAGEAL DILATION N/A 11/10/2012   Procedure: ESOPHAGOGASTRODUODENOSCOPY (EGD) WITH ESOPHAGEAL DILATION;  Surgeon: Gatha Mayer, MD;  Location: [place];  Service: Endoscopy;  Laterality: N/A;  [place] vs. Balloon   VIDEO BRONCHOSCOPY WITH ENDOBRONCHIAL ULTRASOUND Bilateral 01/15/2015   Procedure: VIDEO BRONCHOSCOPY WITH ENDOBRONCHIAL ULTRASOUND of  LEFT LUNG LOWER LOBE and right lung;  Surgeon: Collene Gobble, MD;  Location: [facility] OR;  Service: Thoracic;  Laterality: Bilateral;    Social History   Socioeconomic History   Marital status: Divorced    Spouse name: Not on file   Number of children: 3   Years of education: 14   Highest education level: Not on file  Occupational History   Occupation: Unemployed  Tobacco Use   Smoking status: Never   Smokeless tobacco: Never  Vaping Use   Vaping Use: Never used  Substance and Sexual Activity   Alcohol use: No   Drug use: No   Sexual activity: Not on file  Other Topics Concern   Not on file  Social History Narrative   Fun: Anything and everything   Social Determinants of Health   Financial Resource Strain: Low Risk    Difficulty of Paying Living Expenses: Not hard at all  Food Insecurity: No Food Insecurity   Worried About Charity fundraiser in the Last Year: Never true   [place] in the Last Year: Never true  Transportation Needs: No Transportation Needs   Lack of Transportation (Medical): No   Lack of Transportation (Non-Medical): No  Physical  Activity: Sufficiently Active   Days of Exercise per Week: 5 days   Minutes of Exercise per Session: 30 min  Stress: No Stress Concern Present   Feeling of Stress : Not at all  Social Connections: Moderately Integrated   Frequency of Communication with Friends and Family: More than three times a week   Frequency of Social Gatherings with Friends and Family: Once a week   Attends Religious Services: More than 4 times per year   Active Member of [employer] or Organizations: Yes   Attends Music therapist: More than 4 times per year   Marital Status: Divorced    Family History  Problem Relation Age of Onset   Diabetes Mother    Stroke Mother    Coronary artery disease Father    Coronary artery disease Brother  Hypothyroidism Maternal Grandmother    Diabetes Maternal Grandfather    Heart disease Paternal Grandmother    Heart disease Paternal Grandfather     Review of Systems  Constitutional:  Negative for fever.  Respiratory:  Negative for cough, shortness of breath and wheezing.   Cardiovascular:  Negative for chest pain, palpitations and leg swelling.  Gastrointestinal:  Negative for abdominal pain, constipation, diarrhea and nausea.  Genitourinary:  Negative for dysuria and hematuria.  Musculoskeletal:  Positive for neck pain (right side of posterior neck).  Skin:  Negative for color change and rash.  Neurological:  Positive for numbness. Negative for weakness and headaches.      Objective:   Vitals:   01/13/21 1534 01/13/21 1644  BP: (!) 160/98 (!) 156/88  Pulse: 69   Temp: 98.4 F (36.9 C)   SpO2: 96%    BP Readings from Last 3 Encounters:  01/13/21 (!) 156/88  12/29/20 132/80  12/10/20 (!) 140/94   Wt Readings from Last 3 Encounters:  01/13/21 207 lb (93.9 kg)  12/29/20 203 lb 3.2 oz (92.2 kg)  12/01/20 202 lb 6.4 oz (91.8 kg)   Body mass index is 34.45 kg/m.   Physical Exam Constitutional:      General: She is not in acute distress.     Appearance: Normal appearance. She is not ill-appearing.  HENT:     Head: Normocephalic and atraumatic.  Musculoskeletal:        General: Swelling (Minimal swelling in bilateral arms) present.     Right lower leg: No edema.     Left lower leg: No edema.     Comments: Mild swelling in her finger joints diffusely-tenderness with palpation.,  No other joint swelling.  No joint deformities  Skin:    General: Skin is warm and dry.     Findings: Erythema (Slight erythema posterior fingers between MCP and PIP joints, stretch marks are evident from her scratching.  Generalized erythema upper arms, lower arms and face-she states she gets that way when she gets hot.) present.  Neurological:     Mental Status: She is alert.     Sensory: No sensory deficit.     Motor: No weakness.           Assessment & Plan:    Arthralgia: Acute Started in bilateral shoulders and worked its way down to her wrist and now her hands Associated with swelling, numbness/tingling, and pain ?  Toes beginning to hurt as well ?  Something autoimmune in nature She does have mild erythema posterior fingers near MCP-PIP joints Will check ESR, CRP, CCP, RF and ANA along with CBC and CMP Further evaluation depending on above blood work  Hypertension: Not ideally controlled Stressed lifestyle changes to help She has not tolerated several medications and did tolerate the first generic losartan she took-she will talk to the pharmacist about getting back on that generic and continue 50 mg twice daily She is currently taking hydrochlorothiazide 3 times a week-advised starting this daily  Diabetes: Sugars elevated Stressed weight loss, diabetic diet and regular exercise She would like to restart metformin-she thinks previously when she took it the side effects she had were not related to that medication Start metformin XR 500 mg daily

## 2021-01-13 NOTE — Patient Instructions (Addendum)
  Blood work was ordered.     Medications changes include :   start metformin xr 500 mg daily with breakfast.   Start the hydrochlorothiazide daily.   Your prescription(s) have been submitted to your pharmacy. Please take as directed and contact our office if you believe you are having problem(s) with the medication(s).

## 2021-01-15 LAB — CYCLIC CITRUL PEPTIDE ANTIBODY, IGG: Cyclic Citrullin Peptide Ab: <16 UNITS

## 2021-01-15 LAB — ANA: Anti Nuclear Antibody (ANA): POSITIVE — AB

## 2021-01-15 LAB — RHEUMATOID FACTOR: Rheumatoid fact SerPl-aCnc: <14 IU/mL (ref <14)

## 2021-01-15 LAB — ANTI-NUCLEAR AB-TITER (ANA TITER): ANA Titer 1: 1:80 {titer} — ABNORMAL HIGH

## 2021-01-20 ENCOUNTER — Telehealth: Payer: Self-pay | Admitting: Internal Medicine

## 2021-01-20 MED ORDER — PREDNISONE 10 MG PO TABS: ORAL_TABLET | ORAL | 0 refills | Status: DC

## 2021-01-20 NOTE — Telephone Encounter (Signed)
Prednisone sent to pharmacy on file.

## 2021-01-20 NOTE — Telephone Encounter (Signed)
Patient says at last visit provider advised her that she would send prednisone to pharmacy for patient if necessary  Patient says she would like for provider to send over to pharmacy as soon as possible  Pharmacy: [facility], [address]  Phone:  [phone] Fax:  [phone]

## 2021-01-28 ENCOUNTER — Ambulatory Visit (INDEPENDENT_AMBULATORY_CARE_PROVIDER_SITE_OTHER): Payer: Medicare Other | Admitting: Dermatology

## 2021-01-28 ENCOUNTER — Encounter: Payer: Self-pay | Admitting: Dermatology

## 2021-01-28 ENCOUNTER — Other Ambulatory Visit: Payer: Self-pay

## 2021-01-28 DIAGNOSIS — L603 Nail dystrophy: Secondary | ICD-10-CM

## 2021-01-28 DIAGNOSIS — L821 Other seborrheic keratosis: Secondary | ICD-10-CM | POA: Diagnosis not present

## 2021-01-28 DIAGNOSIS — L859 Epidermal thickening, unspecified: Secondary | ICD-10-CM | POA: Diagnosis not present

## 2021-01-28 DIAGNOSIS — Z1283 Encounter for screening for malignant neoplasm of skin: Secondary | ICD-10-CM

## 2021-01-28 DIAGNOSIS — L564 Polymorphous light eruption: Secondary | ICD-10-CM

## 2021-01-28 DIAGNOSIS — L608 Other nail disorders: Secondary | ICD-10-CM

## 2021-01-28 DIAGNOSIS — L729 Follicular cyst of the skin and subcutaneous tissue, unspecified: Secondary | ICD-10-CM | POA: Diagnosis not present

## 2021-01-28 MED ORDER — MUPIROCIN 2 % EX OINT: 1.0000 "application " | TOPICAL_OINTMENT | Freq: Every day | CUTANEOUS | 0 refills | Status: DC

## 2021-01-28 NOTE — Patient Instructions (Signed)
Get Urea 10% Cream

## 2021-01-30 ENCOUNTER — Ambulatory Visit (INDEPENDENT_AMBULATORY_CARE_PROVIDER_SITE_OTHER): Payer: Medicare Other | Admitting: Internal Medicine

## 2021-01-30 ENCOUNTER — Encounter: Payer: Self-pay | Admitting: Internal Medicine

## 2021-01-30 ENCOUNTER — Other Ambulatory Visit: Payer: Self-pay

## 2021-01-30 VITALS — BP 120/70 | HR 65 | Ht 62.5 in | Wt 203.8 lb

## 2021-01-30 DIAGNOSIS — Z9189 Other specified personal risk factors, not elsewhere classified: Secondary | ICD-10-CM | POA: Diagnosis not present

## 2021-01-30 DIAGNOSIS — I10 Essential (primary) hypertension: Secondary | ICD-10-CM

## 2021-01-30 DIAGNOSIS — R0789 Other chest pain: Secondary | ICD-10-CM | POA: Diagnosis not present

## 2021-01-30 DIAGNOSIS — Z6836 Body mass index (BMI) 36.0-36.9, adult: Secondary | ICD-10-CM | POA: Diagnosis not present

## 2021-01-30 DIAGNOSIS — E119 Type 2 diabetes mellitus without complications: Secondary | ICD-10-CM

## 2021-01-30 NOTE — Progress Notes (Signed)
OFFICE CONSULT NOTE  Chief Complaint:  Chest pain, fatigue, shortness of breath  Primary Care Physician: Hoyt Koch, MD  HPI:  Katrina [name] is a 65 y.o. female who is being seen today for the evaluation of chest pain, fatigue, shortness of breath at the request of Hoyt Koch, *.  This is a pleasant 65 year old female kindly referred for evaluation of fatigue and shortness of breath as well as intermittent chest pain.  Past medical history significant for hypertension, possible sarcoidosis however biopsy did not confirm this, thyroid disease, and recent diagnosis of diabetes based on A1c of 7.3 (not on medication).  She had previously seen Dr. Einar Gip in 2020.  At the time she had shortness of breath and underwent an echocardiogram and plan was for stress testing however that never occurred due to the [place] pandemic.  Echocardiogram demonstrated an LVEF of 65%, normal wall motion and normal diastolic filling.  There is mild tricuspid regurgitation and a normal RVSP of 30 mmHg.  More recently she has been helping her daughter with her wedding.  She also noted that when walking upstairs or doing activities with some exertion she became short of breath.  She also felt fatigue and heaviness in her legs.  She reports that she sleeps well at night but does feel somewhat fatigued in the morning and generally needs to take naps during the day.  She has been told previously that she has some sleep disordered breathing issues by her daughter including snoring.  An EPWSS was performed today in the office and she scored 19 indicating significant chance of dozing while doing multiple activities, being told that she stops breathing or snores at night, waking up frequently to urinate, feeling excessively sleeping during the day and awakening with headaches.  In addition to her personal cardiovascular risk factors there is heart disease in her family including several brothers and father.  Her  brothers had heart disease in their early 8s and her father I believe in his 109s.  01/30/2021  Katrina [name] returns today for follow-up.  Overall she is pleased that she is feeling well.  On 50 mg of losartan her blood pressure is now well controlled at 120/70.  She did start on low-dose metformin XR 500 mg daily.  A1c most recently was 7.3%.  She is working on weight loss and dietary changes.  She has an upcoming split-night study with Dr. Claiborne Billings for possible sleep apnea.  She underwent CT coronary angiography which demonstrated no coronary disease and no coronary calcium therefore very reassuring given her family history of heart disease which is early onset in her brother and father.  PMHx:  Past Medical History:  Diagnosis Date   BCC (basal cell carcinoma of skin) Infundibulocystic 03/11/2008   Inner Left Eye   Chest pain at rest 11/07/2012   Diabetes mellitus without complication (HCC)    Eosinophilic esophagitis 03/00/9233   Esophageal stricture    Exercise-induced asthma    GERD (gastroesophageal reflux disease)    Hx of colonic polyps 03/2020   Hypertension 09/27/2017   Hypothyroidism    Sarcoidosis    Shortness of breath 11/08/2012   RECENTLY  & WHEN i EXERCISE"   Solar lentigo 03/11/2008   Back - Atypical   Thyroid disease    Upper airway cough syndrome     Past Surgical History:  Procedure Laterality Date   ABDOMINAL HYSTERECTOMY     Partial   ESOPHAGOGASTRODUODENOSCOPY (EGD) WITH ESOPHAGEAL DILATION N/A 11/10/2012  Procedure: ESOPHAGOGASTRODUODENOSCOPY (EGD) WITH ESOPHAGEAL DILATION;  Surgeon: Gatha Mayer, MD;  Location: [facility];  Service: Endoscopy;  Laterality: N/A;  [place] vs. [place]   VIDEO BRONCHOSCOPY WITH ENDOBRONCHIAL ULTRASOUND Bilateral 01/15/2015   Procedure: VIDEO BRONCHOSCOPY WITH ENDOBRONCHIAL ULTRASOUND of  LEFT LUNG LOWER LOBE and right lung;  Surgeon: Collene Gobble, MD;  Location: [facility];  Service: Thoracic;  Laterality: Bilateral;    FAMHx:   Family History  Problem Relation Age of Onset   Diabetes Mother    Stroke Mother    Coronary artery disease Father    Coronary artery disease Brother    Hypothyroidism Maternal Grandmother    Diabetes Maternal Grandfather    Heart disease Paternal Grandmother    Heart disease Paternal Grandfather     SOCHx:   reports that she has never smoked. She has never used smokeless tobacco. She reports that she does not drink alcohol and does not use drugs.  ALLERGIES:  Allergies  Allergen Reactions   Penicillins Shortness Of Breath and Rash    DID THE REACTION INVOLVE: Swelling of the face/tongue/throat, SOB, or low BP? N Sudden or severe rash/hives, skin peeling, or the inside of the mouth or nose? Yes Did it require medical treatment? No When did it last happen?   2010  If all above answers are "NO", may proceed with cephalosporin use.    Amlodipine     Hives Other reaction(s): Other (See Comments) Hives   Lisinopril     Weakness/ muscle aches   Metformin And Related     Elevated heart rate   Bactrim Ds [Sulfamethoxazole-Trimethoprim] Rash    Upper body and Face redness with heat.      ROS: Pertinent items noted in HPI and remainder of comprehensive ROS otherwise negative.  HOME MEDS: Current Outpatient Medications on File Prior to Visit  Medication Sig Dispense Refill   albuterol (VENTOLIN HFA) 108 (90 Base) MCG/ACT inhaler INHALE 1 TO 2 PUFFS INTO THE LUNGS EVERY 6 HOURS AS NEEDED FOR WHEEZING OR SHORTNESS OF BREATH 6.7 g 1   aspirin-acetaminophen-caffeine (EXCEDRIN MIGRAINE) 250-250-65 MG tablet Take 1 tablet by mouth every 6 (six) hours as needed for headache.     hydrochlorothiazide (HYDRODIURIL) 25 MG tablet Take 25 mg by mouth daily.     ibuprofen (ADVIL) 200 MG tablet Take 200 mg by mouth every 6 (six) hours as needed for headache or mild pain.     levothyroxine (SYNTHROID) 175 MCG tablet TAKE 1 TABLET(175 MCG) BY MOUTH DAILY BEFORE AND BREAKFAST 90 tablet 0    losartan (COZAAR) 50 MG tablet Take 50 mg by mouth 2 (two) times daily.     metFORMIN (GLUCOPHAGE XR) 500 MG 24 hr tablet Take 1 tablet (500 mg total) by mouth daily with breakfast. 30 tablet 5   mupirocin ointment (BACTROBAN) 2 % Apply 1 application topically daily. 22 g 0   omeprazole (PRILOSEC) 20 MG capsule Take 20 mg by mouth daily.     blood glucose meter kit and supplies KIT Dispense based on patient and insurance preference. (Patient not taking: Reported on 01/30/2021) 1 each 0   predniSONE (DELTASONE) 10 MG tablet 3 tabs po qd x 3 days, then 2 tabs po qd x 3 days, then 1 tab po qd x 3 days (Patient not taking: Reported on 01/30/2021) 18 tablet 0   No current facility-administered medications on file prior to visit.    LABS/IMAGING: No results found for this or any previous visit (from the  past 48 hour(s)). No results found.  LIPID PANEL:    Component Value Date/Time   CHOL 141 08/22/2018 1217   TRIG 268.0 (H) 08/22/2018 1217   HDL 27.00 (L) 08/22/2018 1217   CHOLHDL 5 08/22/2018 1217   VLDL 53.6 (H) 08/22/2018 1217   LDLDIRECT 83.0 08/22/2018 1217    WEIGHTS: Wt Readings from Last 3 Encounters:  01/30/21 203 lb 12.8 oz (92.4 kg)  01/13/21 207 lb (93.9 kg)  12/29/20 203 lb 3.2 oz (92.2 kg)    VITALS: BP 120/70 (BP Location: Left Arm, Patient Position: Sitting, Cuff Size: Large)   Pulse 65   Ht 5' 2.5" (1.588 m)   Wt 203 lb 12.8 oz (92.4 kg)   SpO2 96%   BMI 36.68 kg/m   EXAM: Deferred  EKG: Deferred  ASSESSMENT: Chest pain/dyspnea on exertion/fatigue -no coronary artery disease or coronary calcium by CT coronary angiography (11/2020) Suspect OSA-EPWSS 19, sleep study pending Moderate obesity Hypertension Type 2 diabetes-A1c 7.3% Family history of coronary disease in multiple members  PLAN: 1.   Ms. Donn fortunately had no coronary disease or coronary calcium on her CT.  This is very reassuring.  She has a sleep study pending for probable sleep apnea.   She is to continue with exercise and weight loss.  Blood pressure is now better controlled.  She is on an ARB.  She is on metformin for diabetes and is working to lower her glycemic intake.  Plan follow-up with me annually or sooner as necessary.  Pixie Casino, MD, [facility], [place] Director of the [facility]id [facility]  Card[facility] Diplomate of the American Board of Clinical Lipidology Attending Cardiologist  Direct Dial: [phone]  Fax: [phone]  Website:  [email or web address] Keiji Melland 01/30/2021, 2:14 PM

## 2021-01-30 NOTE — Patient Instructions (Signed)

## 2021-02-02 ENCOUNTER — Encounter (HOSPITAL_BASED_OUTPATIENT_CLINIC_OR_DEPARTMENT_OTHER): Payer: Medicare Other | Admitting: Cardiovascular Disease

## 2021-02-05 ENCOUNTER — Other Ambulatory Visit: Payer: Self-pay | Admitting: Internal Medicine

## 2021-02-12 ENCOUNTER — Ambulatory Visit (HOSPITAL_BASED_OUTPATIENT_CLINIC_OR_DEPARTMENT_OTHER): Payer: Medicare Other | Admitting: Cardiovascular Disease

## 2021-02-14 ENCOUNTER — Encounter: Payer: Self-pay | Admitting: Dermatology

## 2021-02-14 NOTE — Progress Notes (Signed)
   Follow-Up Visit   Subjective  Katrina [name] is a 65 y.o. female who presents for the following: Annual Exam (Yearly skin check original lesion is now gone).  General skin examination, several issues to discuss Location:  Duration:  Quality:  Associated Signs/Symptoms: Modifying Factors:  Severity:  Timing: Context:   Objective  Well appearing patient in no apparent distress; mood and affect are within normal limits. Left Upper Back Open noninflamed 3 mm epidermoid cyst  Chest (Upper Torso, Anterior) Multiple brown flattopped textured papules  Right Lateral Heel, Right Medial Heel, Right Posterior Heel Region Hyperkeratosis without inflammation.  No sign tinea toe webs or toenails.  Right Breast, Right Upper Arm - Anterior History of rash with an update of sun exposure; unclear if there is hardening with repeated sun exposures.  No no active inflammation today.  Left 2nd Finger Nail Plate, Left 3rd Finger Nail Plate, Left 4th Finger Nail Plate, Left 5th Finger Nail Plate, Left Thumb Nail Plate, Right 2nd Finger Nail Plate, Right 3rd Finger Nail Plate, Right 4th Finger Nail Plate, Right 5th Finger Nail Plate, Right Thumb Nail Plate Distal laminated peeling of multiple fingernails    A full examination was performed including scalp, head, eyes, ears, nose, lips, neck, chest, axillae, abdomen, back, buttocks, bilateral upper extremities, bilateral lower extremities, hands, feet, fingers, toes, fingernails, and toenails. All findings within normal limits unless otherwise noted below.  Areas beneath undergarments not fully examined.   Assessment & Plan    Cyst of skin Left Upper Back  No intervention necessary  Seborrheic keratosis Chest (Upper Torso, Anterior)  Leave if stable  Hyperkeratosis (3) Right Lateral Heel; Right Medial Heel; Right Posterior Heel Region  Use 10% Urea Cream (maintain this on the [employer]).  Apply nightly after soaking for 1 month and then as  needed  Polymorphous light eruption Right Upper Arm - Anterior; Right Breast  May return for recheck when rash recurs.  Discussed ultraviolet protection as well as Plaquenil therapy.  Related Medications mupirocin ointment (BACTROBAN) 2 % Apply 1 application topically daily.  Onychoschizia (10) Left 2nd Finger Nail Plate; Right 2nd Finger Nail Plate; Left 3rd Finger Nail Plate; Right 3rd Finger Nail Plate; Left 4th Finger Nail Plate; Right 4th Finger Nail Plate; Left 5th Finger Nail Plate; Right 5th Finger Nail Plate; Left Thumb Nail Plate; Right Thumb Nail Plate  May try over-the-counter DermaNail; try to minimize trauma to nails including wearing gloves for any wet work.      I, Lavonna Monarch, MD, have reviewed all documentation for this visit.  The documentation on 02/14/21 for the exam, diagnosis, procedures, and orders are all accurate and complete.

## 2021-03-29 ENCOUNTER — Ambulatory Visit (HOSPITAL_BASED_OUTPATIENT_CLINIC_OR_DEPARTMENT_OTHER): Payer: Medicare Other | Attending: Internal Medicine | Admitting: Cardiovascular Disease

## 2021-04-16 ENCOUNTER — Telehealth: Payer: Self-pay

## 2021-04-16 NOTE — Telephone Encounter (Signed)
Letter has been sent to patient instructing them to call us if they are still interested in completing their sleep study. If we have not received a response from the patient within 30 days of this notice, the order will be cancelled and they will need to discuss the need for a sleep study at their next office visit.  ° °

## 2021-04-17 ENCOUNTER — Encounter: Payer: Self-pay | Admitting: Internal Medicine

## 2021-04-17 ENCOUNTER — Other Ambulatory Visit: Payer: Self-pay

## 2021-04-17 ENCOUNTER — Ambulatory Visit (INDEPENDENT_AMBULATORY_CARE_PROVIDER_SITE_OTHER): Payer: Medicare Other | Admitting: Internal Medicine

## 2021-04-17 VITALS — BP 128/88 | HR 69 | Resp 18 | Ht 62.5 in | Wt 206.4 lb

## 2021-04-17 DIAGNOSIS — E118 Type 2 diabetes mellitus with unspecified complications: Secondary | ICD-10-CM | POA: Diagnosis not present

## 2021-04-17 DIAGNOSIS — I10 Essential (primary) hypertension: Secondary | ICD-10-CM | POA: Diagnosis not present

## 2021-04-17 DIAGNOSIS — K222 Esophageal obstruction: Secondary | ICD-10-CM

## 2021-04-17 LAB — HEMOGLOBIN A1C: Hgb A1c MFr Bld: 8 % — ABNORMAL HIGH (ref 4.6–6.5)

## 2021-04-17 MED ORDER — HYDROCHLOROTHIAZIDE 12.5 MG PO CAPS: 12.5000 mg | ORAL_CAPSULE | Freq: Every day | ORAL | 3 refills | Status: DC

## 2021-04-17 MED ORDER — SUCRALFATE 1 G PO TABS: 1.0000 g | ORAL_TABLET | Freq: Three times a day (TID) | ORAL | 0 refills | Status: DC

## 2021-04-17 NOTE — Progress Notes (Signed)
° °  Subjective:   Patient ID: Katrina [name], female    DOB: [birth year], 65 y.o.   MRN: [id number]  HPI The patient is a 65 YO female coming in for follow up.  Review of Systems  Constitutional: Negative.   HENT: Negative.    Eyes: Negative.   Respiratory:  Negative for cough, chest tightness and shortness of breath.   Cardiovascular:  Negative for chest pain, palpitations and leg swelling.  Gastrointestinal:  Negative for abdominal distention, abdominal pain, constipation, diarrhea, nausea and vomiting.  Musculoskeletal: Negative.   Skin: Negative.   Neurological: Negative.   Psychiatric/Behavioral: Negative.     Objective:  Physical Exam Constitutional:      Appearance: She is well-developed. She is obese.  HENT:     Head: Normocephalic and atraumatic.  Cardiovascular:     Rate and Rhythm: Normal rate and regular rhythm.  Pulmonary:     Effort: Pulmonary effort is normal. No respiratory distress.     Breath sounds: Normal breath sounds. No wheezing or rales.  Abdominal:     General: Bowel sounds are normal. There is no distension.     Palpations: Abdomen is soft.     Tenderness: There is no abdominal tenderness. There is no rebound.  Musculoskeletal:     Cervical back: Normal range of motion.  Skin:    General: Skin is warm and dry.  Neurological:     Mental Status: She is alert and oriented to person, place, and time.     Coordination: Coordination normal.    Vitals:   04/17/21 1422  BP: 128/88  Pulse: 69  Resp: 18  SpO2: 94%  Weight: 206 lb 6.4 oz (93.6 kg)  Height: 5' 2.5" (1.588 m)   This visit occurred during the SARS-CoV-2 public health emergency.  Safety protocols were in place, including screening questions prior to the visit, additional usage of staff PPE, and extensive cleaning of exam room while observing appropriate contact time as indicated for disinfecting solutions.   Assessment & Plan:

## 2021-04-17 NOTE — Assessment & Plan Note (Signed)
Rx sucralfate to help recent flare due to medication.

## 2021-04-17 NOTE — Patient Instructions (Signed)
For the diabetes if we need or want medicine I would recommend either: Rybelsus, ozempic, trulicity, farxiga, jardiance  We will send in hctz 12.5 mg daily for blood pressure. Stop lisinopril.

## 2021-04-17 NOTE — Assessment & Plan Note (Signed)
She is taking lisinopril right now (old bottle she had at home). Stop lisinopril due to GI problems with it. Rx hctz 12.5 mg daily and stop hctz 25 mg (she is taking 3 times a week currently).

## 2021-04-17 NOTE — Assessment & Plan Note (Signed)
Checking HgA1c and adjust as needed. Recurrent side effects of metformin so she has stopped this about 1-2 months ago. Not on statin. Counseled about options.

## 2021-05-13 ENCOUNTER — Other Ambulatory Visit: Payer: Self-pay | Admitting: Internal Medicine

## 2021-05-21 ENCOUNTER — Telehealth: Payer: Self-pay | Admitting: Internal Medicine

## 2021-05-21 NOTE — Telephone Encounter (Signed)
Patient calling in  Patient says she has started back taking the lisinopril (ZESTRIL) 10 MG tablet & is doing well on it  Wants to know if provider will write new rx for med  Pharmacy  [facility] [place], [place] - [address] AT [address] Phone:  [phone]  Fax:  [phone]-

## 2021-05-21 NOTE — Telephone Encounter (Signed)
Ok to send in a refill?

## 2021-05-22 NOTE — Telephone Encounter (Signed)
Called pt at [phone]. Unable to get in contact with the pt. Mychart message was sent as well.

## 2021-05-22 NOTE — Telephone Encounter (Signed)
We had asked her to start taking hctz 12.5 mg daily and stop lisinopril at last visit. Is she taking hctz 12.5 mg daily? If not I would suggest this and not lisinopril. We asked her to stop taking the lisinopril.

## 2021-05-27 MED ORDER — LOSARTAN POTASSIUM 50 MG PO TABS: 50.0000 mg | ORAL_TABLET | Freq: Every day | ORAL | 0 refills | Status: DC

## 2021-05-27 NOTE — Telephone Encounter (Signed)
See below

## 2021-05-27 NOTE — Telephone Encounter (Addendum)
We have sent in losartan 50 mg to take daily and continue hctz 12.5 mg daily. Needs visit in 2 weeks for BP check and labs. Wilder Glade and jardiance were the oral medications recommended at her last visit did she want to try a specific one of those?

## 2021-05-27 NOTE — Addendum Note (Signed)
Addended by: Pricilla Holm A on: 05/27/2021 11:29 AM   Modules accepted: Orders

## 2021-05-27 NOTE — Telephone Encounter (Signed)
Pt states she has been taking hctz 12.5 mg daily as prescribed, pt states her bp reading as of 2-7 was 178/85, pt states she has an old rx of losartan and took a 50 mg tablet which reduced her bp to 130/73, pt states she will take losartan again today  Pt states she discussed diabetes medication injections w/ provider, pt states injections are to expensive and requesting an oral medication  Pt requesting a c/b

## 2021-07-06 ENCOUNTER — Encounter: Payer: Self-pay | Admitting: Internal Medicine

## 2021-07-06 ENCOUNTER — Ambulatory Visit (INDEPENDENT_AMBULATORY_CARE_PROVIDER_SITE_OTHER): Payer: Medicare Other | Admitting: Internal Medicine

## 2021-07-06 ENCOUNTER — Other Ambulatory Visit: Payer: Self-pay

## 2021-07-06 VITALS — BP 132/70 | HR 55 | Resp 18 | Ht 62.5 in | Wt 208.4 lb

## 2021-07-06 DIAGNOSIS — E039 Hypothyroidism, unspecified: Secondary | ICD-10-CM | POA: Diagnosis not present

## 2021-07-06 DIAGNOSIS — I10 Essential (primary) hypertension: Secondary | ICD-10-CM | POA: Diagnosis not present

## 2021-07-06 DIAGNOSIS — E118 Type 2 diabetes mellitus with unspecified complications: Secondary | ICD-10-CM

## 2021-07-06 LAB — URINALYSIS, ROUTINE W REFLEX MICROSCOPIC
Bilirubin Urine: NEGATIVE
Hgb urine dipstick: NEGATIVE
Ketones, ur: NEGATIVE
Nitrite: NEGATIVE
Specific Gravity, Urine: 1.025 (ref 1.000–1.030)
Total Protein, Urine: NEGATIVE
Urine Glucose: NEGATIVE
Urobilinogen, UA: 0.2 (ref 0.0–1.0)
pH: 5.5 (ref 5.0–8.0)

## 2021-07-06 LAB — COMPREHENSIVE METABOLIC PANEL
ALT: 23 U/L (ref 0–35)
AST: 17 U/L (ref 0–37)
Albumin: 4.1 g/dL (ref 3.5–5.2)
Alkaline Phosphatase: 77 U/L (ref 39–117)
BUN: 14 mg/dL (ref 6–23)
CO2: 28 mEq/L (ref 19–32)
Calcium: 9.3 mg/dL (ref 8.4–10.5)
Chloride: 103 mEq/L (ref 96–112)
Creatinine, Ser: 0.6 mg/dL (ref 0.40–1.20)
GFR: 92.95 mL/min (ref >60.00)
Glucose, Bld: 147 mg/dL — ABNORMAL HIGH (ref 70–99)
Potassium: 4.4 mEq/L (ref 3.5–5.1)
Sodium: 139 mEq/L (ref 135–145)
Total Bilirubin: 0.5 mg/dL (ref 0.2–1.2)
Total Protein: 7.1 g/dL (ref 6.0–8.3)

## 2021-07-06 LAB — MICROALBUMIN / CREATININE URINE RATIO
Creatinine,U: 72.4 mg/dL
Microalb Creat Ratio: 5.8 mg/g (ref 0.0–30.0)
Microalb, Ur: 4.2 mg/dL — ABNORMAL HIGH (ref 0.0–1.9)

## 2021-07-06 LAB — TSH: TSH: 1.09 u[IU]/mL (ref 0.35–5.50)

## 2021-07-06 LAB — HEMOGLOBIN A1C: Hgb A1c MFr Bld: 7.8 % — ABNORMAL HIGH (ref 4.6–6.5)

## 2021-07-06 MED ORDER — METFORMIN HCL ER 500 MG PO TB24: 500.0000 mg | ORAL_TABLET | Freq: Every day | ORAL | 1 refills | Status: DC

## 2021-07-06 NOTE — Assessment & Plan Note (Signed)
She did not let us know which diabetes medication to start after last visit. Her last HgA1c is uncontrolled. Checking microalbumin to creatinine ratio and CMP and HgA1c today and adjust as needed. She did resume metformin 500 mg daily which she has been on several times in the past and has always gotten side effects. She was unsure if the dizziness was related to her sugars and started metformin 3 days ago. Her HgA1c will not be reflective of that. She is not having side effects yet and would like to stay on this for now. Rx metformin 500 mg daily. She is not on ACE-I/ARB or statin at this time. ?

## 2021-07-06 NOTE — Progress Notes (Signed)
? ?  Subjective:  ? ?Patient ID: Katrina [name], female    DOB: [birth year], 66 y.o.   MRN: [id number] ? ?Dizziness ?Pertinent negatives include no abdominal pain, chest pain, coughing, nausea or vomiting.  ?The patient is a 66 YO female coming in for concerns. ? ?Review of Systems  ?Constitutional: Negative.   ?HENT: Negative.    ?Eyes: Negative.   ?Respiratory:  Negative for cough, chest tightness and shortness of breath.   ?Cardiovascular:  Negative for chest pain, palpitations and leg swelling.  ?Gastrointestinal:  Negative for abdominal distention, abdominal pain, constipation, diarrhea, nausea and vomiting.  ?Genitourinary:  Positive for frequency.  ?Musculoskeletal: Negative.   ?Skin: Negative.   ?Neurological:  Positive for dizziness.  ?Psychiatric/Behavioral: Negative.    ? ?Objective:  ?Physical Exam ?Constitutional:   ?   Appearance: She is well-developed.  ?HENT:  ?   Head: Normocephalic and atraumatic.  ?Cardiovascular:  ?   Rate and Rhythm: Normal rate and regular rhythm.  ?Pulmonary:  ?   Effort: Pulmonary effort is normal. No respiratory distress.  ?   Breath sounds: Normal breath sounds. No wheezing or rales.  ?Abdominal:  ?   General: Bowel sounds are normal. There is no distension.  ?   Palpations: Abdomen is soft.  ?   Tenderness: There is no abdominal tenderness. There is no rebound.  ?Musculoskeletal:  ?   Cervical back: Normal range of motion.  ?Skin: ?   General: Skin is warm and dry.  ?Neurological:  ?   Mental Status: She is alert and oriented to person, place, and time.  ?   Coordination: Coordination normal.  ? ? ?Vitals:  ? 07/06/21 1025  ?BP: 132/70  ?Pulse: (!) 55  ?Resp: 18  ?SpO2: 98%  ?Weight: 208 lb 6.4 oz (94.5 kg)  ?Height: 5' 2.5" (1.588 m)  ? ? ?This visit occurred during the SARS-CoV-2 public health emergency.  Safety protocols were in place, including screening questions prior to the visit, additional usage of staff PPE, and extensive cleaning of exam room while observing  appropriate contact time as indicated for disinfecting solutions.  ? ?Assessment & Plan:  ? ?

## 2021-07-06 NOTE — Assessment & Plan Note (Signed)
Checking TSH given new symptoms of dizziness and adjust synthroid 175 mcg daily as needed. Continue for now. ?

## 2021-07-06 NOTE — Assessment & Plan Note (Signed)
Since our last visit we did start hctz 12.5 mg daily, after which she reported high BP readings to almost 559 systolic. We then added losartan 50 mg daily as well. She has since stopped both hctz and losartan as of 3 days ago on her own due to concerns about the dizziness. The dizziness is improving. We have asked her to monitor BP daily and let us know if this is >741 systolic. She may need to resume one of these medications. She is also overdue for CMP which we ordered today and we will adjust plan accordingly.  ?

## 2021-07-06 NOTE — Patient Instructions (Addendum)
We will have you stay off the hctz and the losartan for now. ? ?We will have you keep taking the metformin and let us know if this bothers you. ? ?We will check the labs and urine today. ?

## 2021-07-13 ENCOUNTER — Telehealth: Payer: Self-pay

## 2021-07-13 NOTE — Telephone Encounter (Signed)
Pt called for results. I advised pt Dr. Charlynne Cousins results was Normal/stable labs. No changes needed. Sugars are slightly better and close to goal. ? ?Pt understood and had no further questions ? ?FYI ?

## 2021-07-13 NOTE — Telephone Encounter (Signed)
Noted  

## 2021-08-07 ENCOUNTER — Other Ambulatory Visit: Payer: Self-pay | Admitting: Internal Medicine

## 2021-09-01 ENCOUNTER — Other Ambulatory Visit: Payer: Self-pay | Admitting: Internal Medicine

## 2021-09-09 ENCOUNTER — Other Ambulatory Visit: Payer: Self-pay | Admitting: Internal Medicine

## 2021-09-14 ENCOUNTER — Encounter (HOSPITAL_BASED_OUTPATIENT_CLINIC_OR_DEPARTMENT_OTHER): Payer: Self-pay | Admitting: *Deleted

## 2021-09-14 ENCOUNTER — Emergency Department (HOSPITAL_BASED_OUTPATIENT_CLINIC_OR_DEPARTMENT_OTHER): Payer: Medicare Other

## 2021-09-14 ENCOUNTER — Emergency Department (HOSPITAL_BASED_OUTPATIENT_CLINIC_OR_DEPARTMENT_OTHER)
Admission: EM | Admit: 2021-09-14 | Discharge: 2021-09-14 | Disposition: A | Discharge status: Home / Self Care | Payer: Medicare Other | Attending: Emergency Medicine | Admitting: Emergency Medicine

## 2021-09-14 DIAGNOSIS — Z79899 Other long term (current) drug therapy: Secondary | ICD-10-CM | POA: Diagnosis not present

## 2021-09-14 DIAGNOSIS — Z7951 Long term (current) use of inhaled steroids: Secondary | ICD-10-CM | POA: Insufficient documentation

## 2021-09-14 DIAGNOSIS — R0602 Shortness of breath: Secondary | ICD-10-CM | POA: Diagnosis not present

## 2021-09-14 DIAGNOSIS — J069 Acute upper respiratory infection, unspecified: Secondary | ICD-10-CM | POA: Insufficient documentation

## 2021-09-14 DIAGNOSIS — B9789 Other viral agents as the cause of diseases classified elsewhere: Secondary | ICD-10-CM | POA: Diagnosis not present

## 2021-09-14 DIAGNOSIS — Z7982 Long term (current) use of aspirin: Secondary | ICD-10-CM | POA: Insufficient documentation

## 2021-09-14 DIAGNOSIS — I10 Essential (primary) hypertension: Secondary | ICD-10-CM | POA: Insufficient documentation

## 2021-09-14 DIAGNOSIS — Z20822 Contact with and (suspected) exposure to covid-19: Secondary | ICD-10-CM | POA: Diagnosis not present

## 2021-09-14 LAB — CBC WITH DIFFERENTIAL/PLATELET
Abs Immature Granulocytes: 0.05 10*3/uL (ref 0.00–0.07)
Basophils Absolute: 0.1 10*3/uL (ref 0.0–0.1)
Basophils Relative: 1 %
Eosinophils Absolute: 0 10*3/uL (ref 0.0–0.5)
Eosinophils Relative: 0 %
HCT: 42.6 % (ref 36.0–46.0)
Hemoglobin: 14.2 g/dL (ref 12.0–15.0)
Immature Granulocytes: 1 %
Lymphocytes Relative: 20 %
Lymphs Abs: 1.7 10*3/uL (ref 0.7–4.0)
MCH: 28.2 pg (ref 26.0–34.0)
MCHC: 33.3 g/dL (ref 30.0–36.0)
MCV: 84.5 fL (ref 80.0–100.0)
Monocytes Absolute: 0.4 10*3/uL (ref 0.1–1.0)
Monocytes Relative: 5 %
Neutro Abs: 6.1 10*3/uL (ref 1.7–7.7)
Neutrophils Relative %: 73 %
Platelets: 250 10*3/uL (ref 150–400)
RBC: 5.04 MIL/uL (ref 3.87–5.11)
RDW: 13.6 % (ref 11.5–15.5)
WBC: 8.3 10*3/uL (ref 4.0–10.5)
nRBC: 0 % (ref 0.0–0.2)

## 2021-09-14 LAB — RESP PANEL BY RT-PCR (FLU A&B, COVID) ARPGX2
Influenza A by PCR: NEGATIVE
Influenza B by PCR: NEGATIVE
SARS Coronavirus 2 by RT PCR: NEGATIVE

## 2021-09-14 LAB — BASIC METABOLIC PANEL
Anion gap: 10 (ref 5–15)
BUN: 13 mg/dL (ref 8–23)
CO2: 26 mmol/L (ref 22–32)
Calcium: 9 mg/dL (ref 8.9–10.3)
Chloride: 102 mmol/L (ref 98–111)
Creatinine, Ser: 0.6 mg/dL (ref 0.44–1.00)
GFR, Estimated: >60 mL/min (ref >60)
Glucose, Bld: 155 mg/dL — ABNORMAL HIGH (ref 70–99)
Potassium: 4.4 mmol/L (ref 3.5–5.1)
Sodium: 138 mmol/L (ref 135–145)

## 2021-09-14 LAB — BRAIN NATRIURETIC PEPTIDE: B Natriuretic Peptide: 68.5 pg/mL (ref 0.0–100.0)

## 2021-09-14 LAB — TROPONIN I (HIGH SENSITIVITY): Troponin I (High Sensitivity): 4 ng/L (ref <18)

## 2021-09-14 MED ORDER — DEXAMETHASONE 4 MG PO TABS
10.0000 mg | ORAL_TABLET | Freq: Once | ORAL | Status: AC
  Administered 2021-09-14: 10 mg via ORAL
  Filled 2021-09-14: qty 3

## 2021-09-14 NOTE — ED Notes (Signed)
Ambulated on r/a SpO2 95-96%, HR 75-8o, no distress noted.

## 2021-09-14 NOTE — ED Provider Notes (Signed)
[facility] EMERGENCY DEPARTMENT Provider Note   CSN: [id number] Arrival date & time: 09/14/21  0350     History  Chief Complaint  Patient presents with   Shortness of Breath    Katrina [name] is a 66 y.o. female.  66 yo F with a chief complaints of difficulty breathing.  This been going on for about a week now.  She played tennis at the onset and realized that she was quite short of breath and had to stop to take a break multiple times.  She has had a little bit of congestion and cough with this as well.  Took a home COVID test that was negative.  No fevers or chills.  She realized that she had developed some difficulty breathing especially when she laid back flat was unable to sleep at all last night when she was lying back and so came to the ER for evaluation.  She denies any chest pain or pressure.  Has had some aching in her legs off and on for some time that got a bit worse with tennis.  She has a history of hypertension denies hyperlipidemia diabetes or smoking.  Has had 2 brothers with MIs in their 53s.  She denies history of PE or DVT denies hemoptysis denies unilateral lower extremity edema denies recent surgery hospitalization or immobilization denies estrogen use denies history of cancer.   Shortness of Breath     Home Medications Prior to Admission medications   Medication Sig Start Date End Date Taking? Authorizing Provider  albuterol (VENTOLIN HFA) 108 (90 Base) MCG/ACT inhaler INHALE 1 TO 2 PUFFS INTO THE LUNGS EVERY 6 HOURS AS NEEDED FOR WHEEZING OR SHORTNESS OF BREATH 09/09/21   Hoyt Koch, MD  aspirin-acetaminophen-caffeine (EXCEDRIN MIGRAINE) [phone] MG tablet Take 1 tablet by mouth every 6 (six) hours as needed for headache.    [provider]  blood glucose meter kit and supplies KIT Dispense based on patient and insurance preference. Patient not taking: Reported on 07/06/2021 08/04/20   Marrian Salvage, FNP   hydrochlorothiazide (MICROZIDE) 12.5 MG capsule Take 1 capsule (12.5 mg total) by mouth daily. Patient not taking: Reported on 07/06/2021 04/17/21   Hoyt Koch, MD  ibuprofen (ADVIL) 200 MG tablet Take 200 mg by mouth every 6 (six) hours as needed for headache or mild pain.    [provider]  levothyroxine (SYNTHROID) 175 MCG tablet TAKE 1 TABLET(175 MCG) BY MOUTH DAILY BEFORE AND BREAKFAST 08/07/21   Hoyt Koch, MD  losartan (COZAAR) 50 MG tablet TAKE 1 TABLET(50 MG) BY MOUTH DAILY 09/03/21   Hoyt Koch, MD  metFORMIN (GLUCOPHAGE-XR) 500 MG 24 hr tablet Take 1 tablet (500 mg total) by mouth daily with breakfast. 07/06/21   Hoyt Koch, MD  mupirocin ointment (BACTROBAN) 2 % Apply 1 application topically daily. 01/28/21   Lavonna Monarch, MD  omeprazole (PRILOSEC) 20 MG capsule Take 20 mg by mouth daily.    [provider]  sucralfate (CARAFATE) 1 g tablet Take 1 tablet (1 g total) by mouth 4 (four) times daily -  with meals and at bedtime. 04/17/21   Hoyt Koch, MD      Allergies    Penicillins, Amlodipine, Lisinopril, Metformin and related, and Bactrim ds [sulfamethoxazole-trimethoprim]    Review of Systems   Review of Systems  Respiratory:  Positive for shortness of breath.    Physical Exam Updated Vital Signs BP (!) 150/95   Pulse 63  Temp 98.2 F (36.8 C) (Oral)   Resp 12   Ht 5' 2.5" (1.588 m)   Wt 88.5 kg   SpO2 96%   BMI 35.10 kg/m  Physical Exam Vitals and nursing note reviewed.  Constitutional:      General: She is not in acute distress.    Appearance: She is well-developed. She is not diaphoretic.  HENT:     Head: Normocephalic and atraumatic.     Comments: Swollen turbinates posterior nasal drip Eyes:     Pupils: Pupils are equal, round, and reactive to light.  Cardiovascular:     Rate and Rhythm: Normal rate and regular rhythm.     Heart sounds: No murmur heard.   No friction rub. No gallop.   Pulmonary:     Effort: Pulmonary effort is normal.     Breath sounds: No wheezing or rales.  Abdominal:     General: There is no distension.     Palpations: Abdomen is soft.     Tenderness: There is no abdominal tenderness.  Musculoskeletal:        General: No tenderness.     Cervical back: Normal range of motion and neck supple.  Skin:    General: Skin is warm and dry.  Neurological:     Mental Status: She is alert and oriented to person, place, and time.  Psychiatric:        Behavior: Behavior normal.    ED Results / Procedures / Treatments   Labs (all labs ordered are listed, but only abnormal results are displayed) Labs Reviewed  BASIC METABOLIC PANEL - Abnormal; Notable for the following components:      Result Value   Glucose, Bld 155 (*)    All other components within normal limits  RESP PANEL BY RT-PCR (FLU A&B, COVID) ARPGX2  CBC WITH DIFFERENTIAL/PLATELET  BRAIN NATRIURETIC PEPTIDE  TROPONIN I (HIGH SENSITIVITY)    EKG EKG Interpretation  Date/Time:  Monday Sep 14 2021 10:21:23 EDT Ventricular Rate:  60 PR Interval:  168 QRS Duration: 85 QT Interval:  413 QTC Calculation: 413 R Axis:   48 Text Interpretation: Sinus rhythm No significant change since last tracing Confirmed by Deno Etienne [id number] on 09/14/2021 10:32:53 AM  Radiology DG Chest Port 1 View  Result Date: 09/14/2021 CLINICAL DATA:  Shortness of breath EXAM: PORTABLE CHEST 1 VIEW COMPARISON:  11/26/2019 FINDINGS: Cardiomegaly and vascular pedicle widening. There is no edema, consolidation, effusion, or pneumothorax. No acute osseous finding IMPRESSION: Cardiomegaly and vascular congestion accentuated by low lung volumes. Electronically Signed   By: Jorje Guild M.D.   On: 09/14/2021 10:25    Procedures Procedures    Medications Ordered in ED Medications  dexamethasone (DECADRON) tablet 10 mg (has no administration in time range)    ED Course/ Medical Decision Making/ A&P                            Medical Decision Making Amount and/or Complexity of Data Reviewed Labs: ordered. Radiology: ordered. ECG/medicine tests: ordered.   66 yo F with a chief complaints of shortness of breath.  This has been going on for about a week now.  Is worse with exertion and lying back flat.  She does describe orthopnea and PND which is somewhat concerning.  She has signs of an upper respiratory infection which is more likely the diagnosis but will obtain a laboratory evaluation to assess for new onset heart failure versus MI.  Troponin negative BNP negative.  Chest x-ray independently interpreted by me with likely bronchitis.  Radiology read with concern for possible edema though patient without edema clinically with a normal BNP I think this is unlikely.  We will have her follow-up with her family doctor in the office.  11:13 AM:  I have discussed the diagnosis/risks/treatment options with the patient and family.  Evaluation and diagnostic testing in the emergency department does not suggest an emergent condition requiring admission or immediate intervention beyond what has been performed at this time.  They will follow up with  PCP. We also discussed returning to the ED immediately if new or worsening sx occur. We discussed the sx which are most concerning (e.g., sudden worsening pain, fever, inability to tolerate by mouth) that necessitate immediate return. Medications administered to the patient during their visit and any new prescriptions provided to the patient are listed below.  Medications given during this visit Medications  dexamethasone (DECADRON) tablet 10 mg (has no administration in time range)     The patient appears reasonably screen and/or stabilized for discharge and I doubt any other medical condition or other [facility] requiring further screening, evaluation, or treatment in the ED at this time prior to discharge.          Final Clinical Impression(s) / ED Diagnoses Final  diagnoses:  Viral URI    Rx / DC Orders ED Discharge Orders     None         Deno Etienne, DO 09/14/21 1113

## 2021-09-14 NOTE — ED Notes (Signed)
Alert and oriented, speech wnl, placed on cont POX monitoring with int NBP assessments. Denies any pain at this time.

## 2021-09-14 NOTE — Discharge Instructions (Signed)
Take tylenol 2 pills 4 times a day and motrin 4 pills 3 times a day.  Drink plenty of fluids.  Return for worsening shortness of breath, headache, confusion. Follow up with your family doctor.   

## 2021-09-14 NOTE — ED Triage Notes (Signed)
A week ago, out playing tennis, since then has had an increase shortness of breath, mild cough, non-productive. Using home inhaler, feels like she does not get relief with home meds. RT in to assess client

## 2021-09-14 NOTE — ED Notes (Signed)
Seen before triage, SPo2 96% on r/a, +DOE, NPC, last HFA 12:30 last night.  BBS clear decreased.

## 2021-09-25 ENCOUNTER — Encounter: Payer: Self-pay | Admitting: Internal Medicine

## 2021-09-25 ENCOUNTER — Ambulatory Visit (INDEPENDENT_AMBULATORY_CARE_PROVIDER_SITE_OTHER): Payer: Medicare Other | Admitting: Internal Medicine

## 2021-09-25 DIAGNOSIS — F419 Anxiety disorder, unspecified: Secondary | ICD-10-CM | POA: Diagnosis not present

## 2021-09-25 DIAGNOSIS — R0600 Dyspnea, unspecified: Secondary | ICD-10-CM

## 2021-09-25 DIAGNOSIS — E118 Type 2 diabetes mellitus with unspecified complications: Secondary | ICD-10-CM | POA: Diagnosis not present

## 2021-09-25 DIAGNOSIS — J4520 Mild intermittent asthma, uncomplicated: Secondary | ICD-10-CM

## 2021-09-25 NOTE — Patient Instructions (Addendum)
Ok to continue your current situation, as it appears you do not need further evaluation or treatment  Please continue all other medications as before  Please have the pharmacy call with any other refills you may need.  Please continue your efforts at being more active, low cholesterol diet, and weight control.  Please keep your appointments with your specialists as you may have planned

## 2021-09-25 NOTE — Progress Notes (Unsigned)
Patient ID: Katrina [name], female   DOB: 15-Feb-1956, 66 y.o.   MRN: [id number]        Chief Complaint: follow up recent dyspnea       HPI:  MADISSON KULAGA is a 66 y.o. female here with hx of mild exercise induced asthma, obesity, deconditioning, anxiety and seen in ED with dyspnea, dx viral illness, now resolved.  Pt denies chest pain, increased sob or doe, wheezing, orthopnea, PND, increased LE swelling, palpitations, dizziness or syncope.   Pt denies polydipsia, polyuria, or new focal neuro s/s.    Pt denies fever, wt loss, night sweats, loss of appetite, or other constitutional symptoms  Did see counseling recently and feels this has helped.  Inhaler did not help.   Pt denies polydipsia, polyuria, or new focal neuro s/s.   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  Has been unable to lose wt recently despite attention to diet and activity        Wt Readings from Last 3 Encounters:  09/25/21 206 lb (93.4 kg)  09/14/21 195 lb (88.5 kg)  07/06/21 208 lb 6.4 oz (94.5 kg)   BP Readings from Last 3 Encounters:  09/25/21 126/78  09/14/21 (!) 150/95  07/06/21 132/70         Past Medical History:  Diagnosis Date   BCC (basal cell carcinoma of skin) Infundibulocystic 03/11/2008   Inner Left Eye   Chest pain at rest 11/07/2012   Diabetes mellitus without complication (HCC)    Eosinophilic esophagitis 58/83/2549   Esophageal stricture    Exercise-induced asthma    GERD (gastroesophageal reflux disease)    Hx of colonic polyps 03/2020   Hypertension 09/27/2017   Hypothyroidism    Sarcoidosis    Shortness of breath 11/08/2012   RECENTLY  & WHEN i EXERCISE"   Solar lentigo 03/11/2008   Back - Atypical   Thyroid disease    Upper airway cough syndrome    Past Surgical History:  Procedure Laterality Date   ABDOMINAL HYSTERECTOMY     Partial   ESOPHAGOGASTRODUODENOSCOPY (EGD) WITH ESOPHAGEAL DILATION  N/A 11/10/2012   Procedure: ESOPHAGOGASTRODUODENOSCOPY (EGD) WITH ESOPHAGEAL DILATION;  Surgeon: Gatha Mayer, MD;  Location: [facility];  Service: Endoscopy;  Laterality: N/A;  [place] vs. [place]oon   VIDEO BRONCHOSCOPY WITH ENDOBRONCHIAL ULTRASOUND Bilateral 01/15/2015   Procedure: VIDEO BRONCHOSCOPY WITH ENDOBRONCHIAL ULTRASOUND of  LEFT LUNG LOWER LOBE and right lung;  Surgeon: Collene Gobble, MD;  Location: [facility];  Service: Thoracic;  Laterality: Bilateral;    reports that she has never smoked. She has never used smokeless tobacco. She reports that she does not drink alcohol and does not use drugs. family history includes Coronary artery disease in her brother and father; Diabetes in her maternal grandfather and mother; Heart disease in her paternal grandfather and paternal grandmother; Hypothyroidism in her maternal grandmother; Stroke in her mother. Allergies  Allergen Reactions   Penicillins Shortness Of Breath and Rash    DID THE REACTION INVOLVE: Swelling of the face/tongue/throat, SOB, or low BP? N Sudden or severe rash/hives, skin peeling, or the inside of the mouth or nose? Yes Did it require medical treatment? No When did it last happen?   2010  If all above answers are "NO", may proceed with cephalosporin use.    Amlodipine     Hives Other reaction(s): Other (See  Comments) Hives   Lisinopril     Weakness/ muscle aches   Metformin And Related     Elevated heart rate   Bactrim Ds [Sulfamethoxazole-Trimethoprim] Rash    Upper body and Face redness with heat.     Current Outpatient Medications on File Prior to Visit  Medication Sig Dispense Refill   albuterol (VENTOLIN HFA) 108 (90 Base) MCG/ACT inhaler INHALE 1 TO 2 PUFFS INTO THE LUNGS EVERY 6 HOURS AS NEEDED FOR WHEEZING OR SHORTNESS OF BREATH 6.7 g 1   aspirin-acetaminophen-caffeine (EXCEDRIN MIGRAINE) 250-250-65 MG tablet Take 1 tablet by mouth every 6 (six) hours as needed for headache.     ibuprofen (ADVIL) 200 MG  tablet Take 200 mg by mouth every 6 (six) hours as needed for headache or mild pain.     levothyroxine (SYNTHROID) 175 MCG tablet TAKE 1 TABLET(175 MCG) BY MOUTH DAILY BEFORE AND BREAKFAST 90 tablet 0   losartan (COZAAR) 50 MG tablet TAKE 1 TABLET(50 MG) BY MOUTH DAILY 90 tablet 0   metFORMIN (GLUCOPHAGE-XR) 500 MG 24 hr tablet Take 1 tablet (500 mg total) by mouth daily with breakfast. 90 tablet 1   mupirocin ointment (BACTROBAN) 2 % Apply 1 application topically daily. 22 g 0   omeprazole (PRILOSEC) 20 MG capsule Take 20 mg by mouth daily.     sucralfate (CARAFATE) 1 g tablet Take 1 tablet (1 g total) by mouth 4 (four) times daily -  with meals and at bedtime. 60 tablet 0   blood glucose meter kit and supplies KIT Dispense based on patient and insurance preference. (Patient not taking: Reported on 07/06/2021) 1 each 0   hydrochlorothiazide (MICROZIDE) 12.5 MG capsule Take 1 capsule (12.5 mg total) by mouth daily. (Patient not taking: Reported on 07/06/2021) 90 capsule 3   No current facility-administered medications on file prior to visit.        ROS:  All others reviewed and negative.  Objective        PE:  BP 126/78 (BP Location: Right Arm, Patient Position: Sitting, Cuff Size: Large)   Pulse 63   Temp 98.2 F (36.8 C) (Oral)   Ht 5' 2.5" (1.588 m)   Wt 206 lb (93.4 kg)   SpO2 95%   BMI 37.08 kg/m                 Constitutional: Pt appears in NAD               HENT: Head: NCAT.                Right Ear: External ear normal.                 Left Ear: External ear normal.                Eyes: . Pupils are equal, round, and reactive to light. Conjunctivae and EOM are normal               Nose: without d/c or deformity               Neck: Neck supple. Gross normal ROM               Cardiovascular: Normal rate and regular rhythm.                 Pulmonary/Chest: Effort normal and breath sounds without rales or wheezing.                Abd:  Soft, NT, ND, + BS, no organomegaly                Neurological: Pt is alert. At baseline orientation, motor grossly intact; LE's o/w neurovasc intact               Skin: Skin is warm. No rashes, no other new lesions, LE edema - none               Psychiatric: Pt behavior is normal without agitation   Micro: none  Cardiac tracings I have personally interpreted today:  none  Pertinent Radiological findings (summarize): none   Lab Results  Component Value Date   WBC 8.3 09/14/2021   HGB 14.2 09/14/2021   HCT 42.6 09/14/2021   PLT 250 09/14/2021   GLUCOSE 155 (H) 09/14/2021   CHOL 141 08/22/2018   TRIG 268.0 (H) 08/22/2018   HDL 27.00 (L) 08/22/2018   LDLDIRECT 83.0 08/22/2018   ALT 23 07/06/2021   AST 17 07/06/2021   NA 138 09/14/2021   K 4.4 09/14/2021   CL 102 09/14/2021   CREATININE 0.60 09/14/2021   BUN 13 09/14/2021   CO2 26 09/14/2021   TSH 1.09 07/06/2021   INR 1.10 01/13/2015   HGBA1C 7.8 (H) 07/06/2021   MICROALBUR 4.2 (H) 07/06/2021   Assessment/Plan:  GOLDEN EMILE is a 66 y.o. White or Caucasian [1] female with  has a past medical history of BCC (basal cell carcinoma of skin) Infundibulocystic (03/11/2008), Chest pain at rest (11/07/2012), Diabetes mellitus without complication ([place]), Eosinophilic esophagitis (54/83/2346), Esophageal stricture, Exercise-induced asthma, GERD (gastroesophageal reflux disease), colonic polyps (03/2020), Hypertension (09/27/2017), Hypothyroidism, Sarcoidosis, Shortness of breath (11/08/2012), Solar lentigo (03/11/2008), Thyroid disease, and Upper airway cough syndrome.  Dyspnea Recent symptom over baseline has since resolved,  to f/u any worsening symptoms or concerns  Type 2 diabetes with complication (HCC) Lab Results  Component Value Date   HGBA1C 7.8 (H) 07/06/2021   Stable, pt to continue current medical treatment metformin ER 500 - 1 qd, d/w pt to consider ozempic for sugar and wt control, she will consider this for now   Asthma Stable, continue albuterol hfa  prn  Anxiety Improved recently with counseling, declines need for further med tx  Followup: Return if symptoms worsen or fail to improve.  Cathlean Cower, MD 09/27/2021 2:17 PM [facility]

## 2021-09-27 ENCOUNTER — Encounter: Payer: Self-pay | Admitting: Internal Medicine

## 2021-09-27 DIAGNOSIS — J45909 Unspecified asthma, uncomplicated: Secondary | ICD-10-CM

## 2021-09-27 DIAGNOSIS — R0600 Dyspnea, unspecified: Secondary | ICD-10-CM | POA: Insufficient documentation

## 2021-09-27 DIAGNOSIS — F419 Anxiety disorder, unspecified: Secondary | ICD-10-CM | POA: Insufficient documentation

## 2021-09-27 HISTORY — DX: Unspecified asthma, uncomplicated: J45.909

## 2021-09-27 NOTE — Assessment & Plan Note (Signed)
Stable, continue albuterol hfa prn

## 2021-09-27 NOTE — Assessment & Plan Note (Signed)
Lab Results  Component Value Date   HGBA1C 7.8 (H) 07/06/2021   Stable, pt to continue current medical treatment metformin ER 500 - 1 qd, d/w pt to consider ozempic for sugar and wt control, she will consider this for now

## 2021-09-27 NOTE — Assessment & Plan Note (Signed)
Recent symptom over baseline has since resolved,  to f/u any worsening symptoms or concerns

## 2021-09-27 NOTE — Assessment & Plan Note (Signed)
Improved recently with counseling, declines need for further med tx

## 2021-10-19 DIAGNOSIS — M778 Other enthesopathies, not elsewhere classified: Secondary | ICD-10-CM | POA: Insufficient documentation

## 2021-10-19 DIAGNOSIS — M79672 Pain in left foot: Secondary | ICD-10-CM | POA: Diagnosis not present

## 2021-11-19 ENCOUNTER — Other Ambulatory Visit: Payer: Self-pay | Admitting: Internal Medicine

## 2021-11-27 ENCOUNTER — Other Ambulatory Visit: Payer: Self-pay | Admitting: Internal Medicine

## 2021-11-27 DIAGNOSIS — Z1231 Encounter for screening mammogram for malignant neoplasm of breast: Secondary | ICD-10-CM

## 2021-12-08 ENCOUNTER — Ambulatory Visit (INDEPENDENT_AMBULATORY_CARE_PROVIDER_SITE_OTHER): Payer: Medicare Other | Admitting: Internal Medicine

## 2021-12-08 ENCOUNTER — Encounter: Payer: Self-pay | Admitting: Internal Medicine

## 2021-12-08 VITALS — BP 140/90 | HR 75 | Temp 98.6°F | Ht 62.0 in | Wt 209.0 lb

## 2021-12-08 DIAGNOSIS — E118 Type 2 diabetes mellitus with unspecified complications: Secondary | ICD-10-CM | POA: Diagnosis not present

## 2021-12-08 DIAGNOSIS — R109 Unspecified abdominal pain: Secondary | ICD-10-CM | POA: Diagnosis not present

## 2021-12-08 LAB — COMPREHENSIVE METABOLIC PANEL
ALT: 28 U/L (ref 0–35)
AST: 23 U/L (ref 0–37)
Albumin: 4.4 g/dL (ref 3.5–5.2)
Alkaline Phosphatase: 91 U/L (ref 39–117)
BUN: 13 mg/dL (ref 6–23)
CO2: 30 mEq/L (ref 19–32)
Calcium: 9.6 mg/dL (ref 8.4–10.5)
Chloride: 96 mEq/L (ref 96–112)
Creatinine, Ser: 0.69 mg/dL (ref 0.40–1.20)
GFR: 89.6 mL/min (ref >60.00)
Glucose, Bld: 160 mg/dL — ABNORMAL HIGH (ref 70–99)
Potassium: 4.3 mEq/L (ref 3.5–5.1)
Sodium: 138 mEq/L (ref 135–145)
Total Bilirubin: 0.8 mg/dL (ref 0.2–1.2)
Total Protein: 7.6 g/dL (ref 6.0–8.3)

## 2021-12-08 LAB — CBC
HCT: 45.3 % (ref 36.0–46.0)
Hemoglobin: 15 g/dL (ref 12.0–15.0)
MCHC: 33.2 g/dL (ref 30.0–36.0)
MCV: 84.7 fl (ref 78.0–100.0)
Platelets: 274 10*3/uL (ref 150.0–400.0)
RBC: 5.35 Mil/uL — ABNORMAL HIGH (ref 3.87–5.11)
RDW: 14.2 % (ref 11.5–15.5)
WBC: 9.8 10*3/uL (ref 4.0–10.5)

## 2021-12-08 LAB — POC URINALSYSI DIPSTICK (AUTOMATED)
Bilirubin, UA: NEGATIVE
Blood, UA: POSITIVE
Glucose, UA: NEGATIVE
Ketones, UA: NEGATIVE
Leukocytes, UA: NEGATIVE
Nitrite, UA: NEGATIVE
Protein, UA: POSITIVE — AB
Spec Grav, UA: >=1.03 — AB (ref 1.010–1.025)
Urobilinogen, UA: 1 E.U./dL
pH, UA: 6 (ref 5.0–8.0)

## 2021-12-08 LAB — HEMOGLOBIN A1C: Hgb A1c MFr Bld: 8.5 % — ABNORMAL HIGH (ref 4.6–6.5)

## 2021-12-08 MED ORDER — PANTOPRAZOLE SODIUM 40 MG PO TBEC: 40.0000 mg | DELAYED_RELEASE_TABLET | Freq: Every day | ORAL | 3 refills | Status: DC

## 2021-12-08 NOTE — Progress Notes (Signed)
   Subjective:   Patient ID: Katrina [name], female    DOB: 1956/02/18, 66 y.o.   MRN: [id number]  HPI The patient is a 66 YO female coming in for left sided flank pain and epigastric pain.  Review of Systems  Constitutional: Negative.   HENT: Negative.    Eyes: Negative.   Respiratory:  Negative for cough, chest tightness and shortness of breath.   Cardiovascular:  Negative for chest pain, palpitations and leg swelling.  Gastrointestinal:  Positive for abdominal pain and constipation. Negative for abdominal distention, diarrhea, nausea and vomiting.  Musculoskeletal: Negative.   Skin: Negative.   Neurological: Negative.   Psychiatric/Behavioral: Negative.      Objective:  Physical Exam Constitutional:      Appearance: She is well-developed.  HENT:     Head: Normocephalic and atraumatic.  Cardiovascular:     Rate and Rhythm: Normal rate and regular rhythm.  Pulmonary:     Effort: Pulmonary effort is normal. No respiratory distress.     Breath sounds: Normal breath sounds. No wheezing or rales.  Abdominal:     General: Bowel sounds are normal. There is no distension.     Palpations: Abdomen is soft.     Tenderness: There is abdominal tenderness. There is no rebound.  Musculoskeletal:     Cervical back: Normal range of motion.  Skin:    General: Skin is warm and dry.  Neurological:     Mental Status: She is alert and oriented to person, place, and time.     Coordination: Coordination normal.     Vitals:   12/08/21 1341 12/08/21 1357  BP: (!) 140/98 (!) 140/90  Pulse: 75   Temp: 98.6 F (37 C)   TempSrc: Oral   SpO2: 95%   Weight: 209 lb (94.8 kg)   Height: '5\' 2"'$  (1.575 m)     Assessment & Plan:

## 2021-12-08 NOTE — Patient Instructions (Addendum)
We have sent in protonix to take 1 pill daily in the morning. You can stop the omeprazole as this will replace that.  We will check the labs today.

## 2021-12-09 ENCOUNTER — Telehealth: Payer: Self-pay

## 2021-12-09 DIAGNOSIS — R1012 Left upper quadrant pain: Secondary | ICD-10-CM

## 2021-12-09 DIAGNOSIS — M546 Pain in thoracic spine: Secondary | ICD-10-CM | POA: Insufficient documentation

## 2021-12-09 DIAGNOSIS — R109 Unspecified abdominal pain: Secondary | ICD-10-CM | POA: Insufficient documentation

## 2021-12-09 NOTE — Telephone Encounter (Signed)
Spoke with pt and was abel to inform her that an order for a CT Abdomen was ordered and she should be hearing about this apptmnt soon.

## 2021-12-09 NOTE — Telephone Encounter (Signed)
Pt is calling to report to Dr. Sharlet Salina that  last night she started having body cramps and then this morning she noticed that her urine is the color of a coke.  Pt is requesting a cb from the [facility] after seeking recommendation from Dr. Sharlet Salina.

## 2021-12-09 NOTE — Assessment & Plan Note (Signed)
U/A done in office with protein and blood. Possible kidney stone. She is also having epigastric symptoms and will add protonix 40 mg daily to see if this helps. Addendum she called next day with coke colored urine so CT renal stone study ordered stat to rule out stone/hydronephrosis. Ordered CBC and CMP to assess.

## 2021-12-09 NOTE — Assessment & Plan Note (Signed)
Checking HgA1c as doing labs while here. Adjust as needed she is not taking medications currently she was unable to tolerate metformin.

## 2021-12-09 NOTE — Telephone Encounter (Signed)
Have ordered a CT scan of the abdomen to look for kidney stone to be done as soon as possible.

## 2021-12-10 ENCOUNTER — Ambulatory Visit
Admission: RE | Admit: 2021-12-10 | Discharge: 2021-12-10 | Disposition: A | Discharge status: Home / Self Care | Payer: Medicare Other | Source: Ambulatory Visit | Attending: Internal Medicine | Admitting: Internal Medicine

## 2021-12-10 DIAGNOSIS — R109 Unspecified abdominal pain: Secondary | ICD-10-CM | POA: Diagnosis not present

## 2021-12-10 DIAGNOSIS — K760 Fatty (change of) liver, not elsewhere classified: Secondary | ICD-10-CM | POA: Diagnosis not present

## 2021-12-10 DIAGNOSIS — D869 Sarcoidosis, unspecified: Secondary | ICD-10-CM | POA: Diagnosis not present

## 2021-12-10 DIAGNOSIS — R1012 Left upper quadrant pain: Secondary | ICD-10-CM

## 2021-12-10 DIAGNOSIS — Z90710 Acquired absence of both cervix and uterus: Secondary | ICD-10-CM | POA: Diagnosis not present

## 2021-12-11 ENCOUNTER — Encounter: Payer: Self-pay | Admitting: Family Medicine

## 2021-12-11 ENCOUNTER — Ambulatory Visit (INDEPENDENT_AMBULATORY_CARE_PROVIDER_SITE_OTHER): Payer: Medicare Other | Admitting: Family Medicine

## 2021-12-11 VITALS — BP 118/60 | HR 74 | Temp 97.6°F | Ht 62.0 in | Wt 205.0 lb

## 2021-12-11 DIAGNOSIS — R82998 Other abnormal findings in urine: Secondary | ICD-10-CM | POA: Insufficient documentation

## 2021-12-11 DIAGNOSIS — R197 Diarrhea, unspecified: Secondary | ICD-10-CM

## 2021-12-11 DIAGNOSIS — R52 Pain, unspecified: Secondary | ICD-10-CM | POA: Diagnosis not present

## 2021-12-11 DIAGNOSIS — R252 Cramp and spasm: Secondary | ICD-10-CM | POA: Diagnosis not present

## 2021-12-11 DIAGNOSIS — R509 Fever, unspecified: Secondary | ICD-10-CM | POA: Insufficient documentation

## 2021-12-11 LAB — COMPREHENSIVE METABOLIC PANEL
ALT: 46 U/L — ABNORMAL HIGH (ref 0–35)
AST: 46 U/L — ABNORMAL HIGH (ref 0–37)
Albumin: 4.3 g/dL (ref 3.5–5.2)
Alkaline Phosphatase: 81 U/L (ref 39–117)
BUN: 15 mg/dL (ref 6–23)
CO2: 26 mEq/L (ref 19–32)
Calcium: 9.2 mg/dL (ref 8.4–10.5)
Chloride: 102 mEq/L (ref 96–112)
Creatinine, Ser: 0.93 mg/dL (ref 0.40–1.20)
GFR: 63.49 mL/min (ref >60.00)
Glucose, Bld: 166 mg/dL — ABNORMAL HIGH (ref 70–99)
Potassium: 3.8 mEq/L (ref 3.5–5.1)
Sodium: 138 mEq/L (ref 135–145)
Total Bilirubin: 0.6 mg/dL (ref 0.2–1.2)
Total Protein: 7.8 g/dL (ref 6.0–8.3)

## 2021-12-11 LAB — CBC WITH DIFFERENTIAL/PLATELET
Basophils Absolute: 0 10*3/uL (ref 0.0–0.1)
Basophils Relative: 0.5 % (ref 0.0–3.0)
Eosinophils Absolute: 0.1 10*3/uL (ref 0.0–0.7)
Eosinophils Relative: 1.2 % (ref 0.0–5.0)
HCT: 46 % (ref 36.0–46.0)
Hemoglobin: 15.3 g/dL — ABNORMAL HIGH (ref 12.0–15.0)
Lymphocytes Relative: 17.5 % (ref 12.0–46.0)
Lymphs Abs: 0.9 10*3/uL (ref 0.7–4.0)
MCHC: 33.3 g/dL (ref 30.0–36.0)
MCV: 85.7 fl (ref 78.0–100.0)
Monocytes Absolute: 0.6 10*3/uL (ref 0.1–1.0)
Monocytes Relative: 11.3 % (ref 3.0–12.0)
Neutro Abs: 3.8 10*3/uL (ref 1.4–7.7)
Neutrophils Relative %: 69.5 % (ref 43.0–77.0)
Platelets: 252 10*3/uL (ref 150.0–400.0)
RBC: 5.37 Mil/uL — ABNORMAL HIGH (ref 3.87–5.11)
RDW: 14.5 % (ref 11.5–15.5)
WBC: 5.4 10*3/uL (ref 4.0–10.5)

## 2021-12-11 LAB — URINALYSIS, ROUTINE W REFLEX MICROSCOPIC
Ketones, ur: NEGATIVE
Leukocytes,Ua: NEGATIVE
Nitrite: NEGATIVE
RBC / HPF: NONE SEEN (ref 0–?)
Specific Gravity, Urine: >=1.03 — AB (ref 1.000–1.030)
Total Protein, Urine: 30 — AB
Urine Glucose: NEGATIVE
Urobilinogen, UA: 0.2 (ref 0.0–1.0)
WBC, UA: NONE SEEN (ref 0–?)
pH: 6 (ref 5.0–8.0)

## 2021-12-11 LAB — POC COVID19 BINAXNOW: SARS Coronavirus 2 Ag: NEGATIVE

## 2021-12-11 LAB — MAGNESIUM: Magnesium: 1.8 mg/dL (ref 1.5–2.5)

## 2021-12-11 NOTE — Patient Instructions (Signed)
Go downstairs for labs before your leave.   Stay well hydrated. You can take Imodium if no fever or blood or pus in your stool.   If you have any new or worsening symptoms, you should be evaluated again.

## 2021-12-11 NOTE — Progress Notes (Unsigned)
Subjective:     Patient ID: Katrina [name], female    DOB: [birth year], 66 y.o.   MRN: [id number]  Chief Complaint  Patient presents with   Results    Discuss CT scan results.    Diarrhea    Watery diarrhea for about 2 days with a sweet smell and is concerned about C-diff. Tuesday afternoon she had a fever, was achy, nausea and stomach pain.     HPI Patient is in today for a 2 day history of fever, body aches, vomiting, and watery diarrhea.  Flank pain and abdominal pain have resolved. Urine was dark but cleared up.  Hx of C-diff 4 years ago.  No recent antibiotics.   CT yesterday showed no renal stones, only fatty liver.   Health Maintenance Due  Topic Date Due   OPHTHALMOLOGY EXAM  Never done   TETANUS/TDAP  Never done   Zoster Vaccines- Shingrix (1 of 2) Never done   Pneumonia Vaccine 26+ Years old (1 - PCV) Never done   DEXA SCAN  Never done   COVID-19 Vaccine (3 - Pfizer risk series) 04/26/2020   FOOT EXAM  10/01/2021   INFLUENZA VACCINE  11/17/2021    Past Medical History:  Diagnosis Date   BCC (basal cell carcinoma of skin) Infundibulocystic 03/11/2008   Inner Left Eye   Chest pain at rest 11/07/2012   Diabetes mellitus without complication (HCC)    Eosinophilic esophagitis 09/73/5329   Esophageal stricture    Exercise-induced asthma    GERD (gastroesophageal reflux disease)    Hx of colonic polyps 03/2020   Hypertension 09/27/2017   Hypothyroidism    Sarcoidosis    Shortness of breath 11/08/2012   RECENTLY  & WHEN i EXERCISE"   Solar lentigo 03/11/2008   Back - Atypical   Thyroid disease    Upper airway cough syndrome     Past Surgical History:  Procedure Laterality Date   ABDOMINAL HYSTERECTOMY     Partial   ESOPHAGOGASTRODUODENOSCOPY (EGD) WITH ESOPHAGEAL DILATION N/A 11/10/2012   Procedure: ESOPHAGOGASTRODUODENOSCOPY (EGD) WITH ESOPHAGEAL DILATION;  Surgeon: Gatha Mayer, MD;  Location: [facility] ENDOSCOPY;  Service: Endoscopy;  Laterality: N/A;  [place]  vs. [place]   VIDEO BRONCHOSCOPY WITH ENDOBRONCHIAL ULTRASOUND Bilateral 01/15/2015   Procedure: VIDEO BRONCHOSCOPY WITH ENDOBRONCHIAL ULTRASOUND of  LEFT LUNG LOWER LOBE and right lung;  Surgeon: Collene Gobble, MD;  Location: [facility];  Service: Thoracic;  Laterality: Bilateral;    Family History  Problem Relation Age of Onset   Diabetes Mother    Stroke Mother    Coronary artery disease Father    Coronary artery disease Brother    Hypothyroidism Maternal Grandmother    Diabetes Maternal Grandfather    Heart disease Paternal Grandmother    Heart disease Paternal Grandfather     Social History   Socioeconomic History   Marital status: Divorced    Spouse name: Not on file   Number of children: 3   Years of education: 14   Highest education level: Not on file  Occupational History   Occupation: Unemployed  Tobacco Use   Smoking status: Never   Smokeless tobacco: Never  Vaping Use   Vaping Use: Never used  Substance and Sexual Activity   Alcohol use: No   Drug use: No   Sexual activity: Not on file  Other Topics Concern   Not on file  Social History Narrative   Fun: Anything and everything   Social Determinants of Health  Financial Resource Strain: Low Risk  (12/29/2020)   Overall Financial Resource Strain (CARDIA)    Difficulty of Paying Living Expenses: Not hard at all  Food Insecurity: No Food Insecurity (12/29/2020)   Hunger Vital Sign    Worried About Running Out of Food in the Last Year: Never true    Ran Out of Food in the Last Year: Never true  Transportation Needs: No Transportation Needs (12/29/2020)   PRAPARE - Hydrologist (Medical): No    Lack of Transportation (Non-Medical): No  Physical Activity: Sufficiently Active (12/29/2020)   Exercise Vital Sign    Days of Exercise per Week: 5 days    Minutes of Exercise per Session: 30 min  Stress: No Stress Concern Present (12/29/2020)   [place]    Feeling of Stress : Not at all  Social Connections: Moderately Integrated (12/29/2020)   Social Connection and Isolation Panel [NHANES]    Frequency of Communication with Friends and Family: More than three times a week    Frequency of Social Gatherings with Friends and Family: Once a week    Attends Religious Services: More than 4 times per year    Active Member of [employer] or Organizations: Yes    Attends Archivist Meetings: More than 4 times per year    Marital Status: Divorced  Intimate Partner Violence: Not At Risk (12/29/2020)   Humiliation, Afraid, Rape, and Kick questionnaire    Fear of Current or Ex-Partner: No    Emotionally Abused: No    Physically Abused: No    Sexually Abused: No    Outpatient Medications Prior to Visit  Medication Sig Dispense Refill   albuterol (VENTOLIN HFA) 108 (90 Base) MCG/ACT inhaler INHALE 1 TO 2 PUFFS INTO THE LUNGS EVERY 6 HOURS AS NEEDED FOR WHEEZING OR SHORTNESS OF BREATH 6.7 g 1   aspirin-acetaminophen-caffeine (EXCEDRIN MIGRAINE) 250-250-65 MG tablet Take 1 tablet by mouth every 6 (six) hours as needed for headache.     ibuprofen (ADVIL) 200 MG tablet Take 200 mg by mouth every 6 (six) hours as needed for headache or mild pain.     levothyroxine (SYNTHROID) 175 MCG tablet TAKE 1 TABLET(175 MCG) BY MOUTH DAILY 90 tablet 0   losartan (COZAAR) 50 MG tablet TAKE 1 TABLET(50 MG) BY MOUTH DAILY 90 tablet 0   mupirocin ointment (BACTROBAN) 2 % Apply 1 application topically daily. 22 g 0   pantoprazole (PROTONIX) 40 MG tablet Take 1 tablet (40 mg total) by mouth daily. 30 tablet 3   sucralfate (CARAFATE) 1 g tablet Take 1 tablet (1 g total) by mouth 4 (four) times daily -  with meals and at bedtime. 60 tablet 0   No facility-administered medications prior to visit.    Allergies  Allergen Reactions   Penicillins Shortness Of Breath and Rash    DID THE REACTION INVOLVE: Swelling of the face/tongue/throat,  SOB, or low BP? N Sudden or severe rash/hives, skin peeling, or the inside of the mouth or nose? Yes Did it require medical treatment? No When did it last happen?   2010  If all above answers are "NO", may proceed with cephalosporin use.    Amlodipine     Hives Other reaction(s): Other (See Comments) Hives   Lisinopril     Weakness/ muscle aches   Metformin And Related     Elevated heart rate   Bactrim Ds [Sulfamethoxazole-Trimethoprim] Rash  Upper body and Face redness with heat.      ROS     Objective:    Physical Exam Constitutional:      General: She is not in acute distress.    Appearance: She is ill-appearing.  HENT:     Mouth/Throat:     Mouth: Mucous membranes are moist.  Eyes:     Conjunctiva/sclera: Conjunctivae normal.     Pupils: Pupils are equal, round, and reactive to light.  Cardiovascular:     Rate and Rhythm: Normal rate and regular rhythm.  Pulmonary:     Effort: Pulmonary effort is normal.     Breath sounds: Normal breath sounds.  Abdominal:     General: Bowel sounds are normal. There is no distension.     Palpations: Abdomen is soft.     Tenderness: There is generalized abdominal tenderness. There is no right CVA tenderness, left CVA tenderness, guarding or rebound. Negative signs include Murphy's sign and McBurney's sign.  Musculoskeletal:        General: Normal range of motion.     Cervical back: Normal range of motion and neck supple.  Skin:    General: Skin is warm and dry.  Neurological:     General: No focal deficit present.     Mental Status: She is alert and oriented to person, place, and time.     Cranial Nerves: No cranial nerve deficit.     Motor: No weakness.  Psychiatric:        Mood and Affect: Mood normal.        Behavior: Behavior normal.     BP 118/60 (BP Location: Right Arm, Patient Position: Sitting, Cuff Size: Large)   Pulse 74   Temp 97.6 F (36.4 C) (Temporal)   Ht '5\' 2"'$  (1.575 m)   Wt 205 lb (93 kg)   SpO2  96%   BMI 37.49 kg/m  Wt Readings from Last 3 Encounters:  12/11/21 205 lb (93 kg)  12/08/21 209 lb (94.8 kg)  09/25/21 206 lb (93.4 kg)       Assessment & Plan:   Problem List Items Addressed This Visit       Other   Body aches    Negative Covid test. Most likely a viral illness      Relevant Orders   POC COVID-19 (Completed)   Dark urine    Urinalysis dipstick shows specific grav >1.030, bili 1+, Hgb +, neg leuks  ket      Relevant Orders   Urinalysis   Diarrhea - Primary    Reviewed CT results with patient. No focal abdominal tenderness or pain. Check labs. Hydrate. May take Imodium. Hx of C-diff. If diarrhea persists she will follow up next week for stool studies.       Relevant Orders   POC COVID-19 (Completed)   CBC with Differential/Platelet (Completed)   Comprehensive metabolic panel (Completed)   Urinalysis   Magnesium (Completed)   Fever    Most likely a viral etiology. Treat with Tylenol      Relevant Orders   POC COVID-19 (Completed)   CBC with Differential/Platelet (Completed)   Comprehensive metabolic panel (Completed)   Other Visit Diagnoses     Muscle cramps       Relevant Orders   Magnesium (Completed)        I am having Katrina [name] maintain her ibuprofen, aspirin-acetaminophen-caffeine, mupirocin ointment, sucralfate, losartan, albuterol, levothyroxine, and pantoprazole.  No orders of the defined types were placed  in this encounter.

## 2021-12-12 NOTE — Assessment & Plan Note (Signed)
Urinalysis dipstick shows specific grav >1.030, bili 1+, Hgb +, neg leuks  ket

## 2021-12-12 NOTE — Assessment & Plan Note (Signed)
Negative Covid test. Most likely a viral illness

## 2021-12-12 NOTE — Assessment & Plan Note (Signed)
Most likely a viral etiology. Treat with Tylenol

## 2021-12-12 NOTE — Assessment & Plan Note (Signed)
Reviewed CT results with patient. No focal abdominal tenderness or pain. Check labs. Hydrate. May take Imodium. Hx of C-diff. If diarrhea persists she will follow up next week for stool studies.

## 2021-12-15 ENCOUNTER — Ambulatory Visit: Payer: Medicare Other

## 2021-12-16 ENCOUNTER — Other Ambulatory Visit: Payer: Self-pay

## 2021-12-16 ENCOUNTER — Encounter (HOSPITAL_BASED_OUTPATIENT_CLINIC_OR_DEPARTMENT_OTHER): Payer: Self-pay | Admitting: Emergency Medicine

## 2021-12-16 ENCOUNTER — Emergency Department (HOSPITAL_BASED_OUTPATIENT_CLINIC_OR_DEPARTMENT_OTHER)
Admission: EM | Admit: 2021-12-16 | Discharge: 2021-12-16 | Disposition: A | Discharge status: Home / Self Care | Payer: Medicare Other | Attending: Emergency Medicine | Admitting: Emergency Medicine

## 2021-12-16 DIAGNOSIS — D72829 Elevated white blood cell count, unspecified: Secondary | ICD-10-CM | POA: Insufficient documentation

## 2021-12-16 DIAGNOSIS — R5383 Other fatigue: Secondary | ICD-10-CM | POA: Diagnosis not present

## 2021-12-16 DIAGNOSIS — R110 Nausea: Secondary | ICD-10-CM | POA: Diagnosis not present

## 2021-12-16 DIAGNOSIS — R1032 Left lower quadrant pain: Secondary | ICD-10-CM | POA: Diagnosis not present

## 2021-12-16 DIAGNOSIS — Z7982 Long term (current) use of aspirin: Secondary | ICD-10-CM | POA: Diagnosis not present

## 2021-12-16 DIAGNOSIS — E119 Type 2 diabetes mellitus without complications: Secondary | ICD-10-CM | POA: Diagnosis not present

## 2021-12-16 DIAGNOSIS — I10 Essential (primary) hypertension: Secondary | ICD-10-CM | POA: Diagnosis not present

## 2021-12-16 DIAGNOSIS — R1012 Left upper quadrant pain: Secondary | ICD-10-CM | POA: Diagnosis not present

## 2021-12-16 DIAGNOSIS — R300 Dysuria: Secondary | ICD-10-CM | POA: Insufficient documentation

## 2021-12-16 DIAGNOSIS — Z79899 Other long term (current) drug therapy: Secondary | ICD-10-CM | POA: Insufficient documentation

## 2021-12-16 DIAGNOSIS — R531 Weakness: Secondary | ICD-10-CM | POA: Diagnosis not present

## 2021-12-16 LAB — COMPREHENSIVE METABOLIC PANEL
ALT: 32 U/L (ref 0–44)
AST: 26 U/L (ref 15–41)
Albumin: 3.5 g/dL (ref 3.5–5.0)
Alkaline Phosphatase: 79 U/L (ref 38–126)
Anion gap: 8 (ref 5–15)
BUN: 12 mg/dL (ref 8–23)
CO2: 26 mmol/L (ref 22–32)
Calcium: 8.6 mg/dL — ABNORMAL LOW (ref 8.9–10.3)
Chloride: 104 mmol/L (ref 98–111)
Creatinine, Ser: 0.71 mg/dL (ref 0.44–1.00)
GFR, Estimated: >60 mL/min (ref >60)
Glucose, Bld: 140 mg/dL — ABNORMAL HIGH (ref 70–99)
Potassium: 4.1 mmol/L (ref 3.5–5.1)
Sodium: 138 mmol/L (ref 135–145)
Total Bilirubin: 0.7 mg/dL (ref 0.3–1.2)
Total Protein: 7 g/dL (ref 6.5–8.1)

## 2021-12-16 LAB — CBC WITH DIFFERENTIAL/PLATELET
Abs Immature Granulocytes: 0.12 10*3/uL — ABNORMAL HIGH (ref 0.00–0.07)
Basophils Absolute: 0 10*3/uL (ref 0.0–0.1)
Basophils Relative: 1 %
Eosinophils Absolute: 0 10*3/uL (ref 0.0–0.5)
Eosinophils Relative: 0 %
HCT: 38.7 % (ref 36.0–46.0)
Hemoglobin: 13.3 g/dL (ref 12.0–15.0)
Immature Granulocytes: 2 %
Lymphocytes Relative: 29 %
Lymphs Abs: 2.3 10*3/uL (ref 0.7–4.0)
MCH: 28.7 pg (ref 26.0–34.0)
MCHC: 34.4 g/dL (ref 30.0–36.0)
MCV: 83.4 fL (ref 80.0–100.0)
Monocytes Absolute: 0.4 10*3/uL (ref 0.1–1.0)
Monocytes Relative: 6 %
Neutro Abs: 5.1 10*3/uL (ref 1.7–7.7)
Neutrophils Relative %: 62 %
Platelets: 273 10*3/uL (ref 150–400)
RBC: 4.64 MIL/uL (ref 3.87–5.11)
RDW: 13.3 % (ref 11.5–15.5)
WBC: 8 10*3/uL (ref 4.0–10.5)
nRBC: 0 % (ref 0.0–0.2)

## 2021-12-16 LAB — URINALYSIS, MICROSCOPIC (REFLEX): RBC / HPF: NONE SEEN RBC/hpf (ref 0–5)

## 2021-12-16 LAB — URINALYSIS, ROUTINE W REFLEX MICROSCOPIC
Bilirubin Urine: NEGATIVE
Glucose, UA: NEGATIVE mg/dL
Hgb urine dipstick: NEGATIVE
Ketones, ur: NEGATIVE mg/dL
Nitrite: NEGATIVE
Protein, ur: NEGATIVE mg/dL
Specific Gravity, Urine: 1.02 (ref 1.005–1.030)
pH: 5.5 (ref 5.0–8.0)

## 2021-12-16 LAB — LIPASE, BLOOD: Lipase: 27 U/L (ref 11–51)

## 2021-12-16 MED ORDER — PANTOPRAZOLE SODIUM 40 MG IV SOLR
40.0000 mg | Freq: Once | INTRAVENOUS | Status: AC
  Administered 2021-12-16: 40 mg via INTRAVENOUS
  Filled 2021-12-16: qty 10

## 2021-12-16 MED ORDER — LACTATED RINGERS IV BOLUS
1000.0000 mL | Freq: Once | INTRAVENOUS | Status: AC
  Administered 2021-12-16: 1000 mL via INTRAVENOUS

## 2021-12-16 MED ORDER — ONDANSETRON HCL 4 MG/2ML IJ SOLN
4.0000 mg | Freq: Once | INTRAMUSCULAR | Status: AC
  Administered 2021-12-16: 4 mg via INTRAVENOUS
  Filled 2021-12-16: qty 2

## 2021-12-16 MED ORDER — ONDANSETRON HCL 4 MG PO TABS: 4.0000 mg | ORAL_TABLET | Freq: Four times a day (QID) | ORAL | 0 refills | Status: DC

## 2021-12-16 MED ORDER — CIPROFLOXACIN HCL 500 MG PO TABS: 250.0000 mg | ORAL_TABLET | Freq: Two times a day (BID) | ORAL | 0 refills | Status: AC

## 2021-12-16 NOTE — Discharge Instructions (Addendum)
You were seen in the emergency department today for abdominal pain.  Your work-up here has been reassuring.  We had a shared decision making about whether we would do a CT with contrast of your abdomen or not.  He decided to trial some medications and reassess how you are feeling.  I have prescribed you Zofran for nausea.  Additionally I am prescribing you ciprofloxacin that you will take twice a day over the next 3 days.  This is for possible UTI.  You have already been prescribed Protonix and Carafate by your primary care provider which I think is appropriate for possible peptic ulcer disease.  Please return to the emergency department for significantly worsening symptoms.

## 2021-12-16 NOTE — ED Triage Notes (Signed)
Gastroenteritis last week , persistent abdominal apin and lethargy , sent by PCP for IV fluids.

## 2021-12-16 NOTE — ED Provider Notes (Signed)
[place] EMERGENCY DEPARTMENT Provider Note   CSN: [id number] Arrival date & time: 12/16/21  1821     History  Chief Complaint  Patient presents with   Abdominal Pain    Katrina [name] is a 66 y.o. female.  With past medical history of hypertension, eosinophilic esophagitis, diabetes, pulmonary sarcoidosis who presents to the emergency department with abdominal pain.  She describes having left upper quadrant abdominal pain that she states is intermittent.  She states that she is also having extreme fatigue and weakness.  She states that she generally is just feeling "awful."  She states that she is having nausea without vomiting and Monday night woke up to use the restroom and describes urine "just dribbling out."  She states that symptoms have really been going on since last Tuesday.  She states that she began having this left upper quadrant pain last Tuesday with watery diarrhea.  She was seen by her PCP at that time who diagnosed her with likely viral illness.  She states that on Tuesday night she began having fever and body aches.  She also had a CT scan at that time which was negative for any acute findings.  States that she eventually got better on Sunday.  States that in the middle the night Monday night she woke up feeling very poorly.  She got in touch with her doctor on Tuesday that perform more blood work showing that she was "dehydrated."  They told her if she felt worse to present to the emergency department.  She states that she has been trying to drink Pedialyte and use Imodium.  She states that the diarrhea is improving but the pain and nausea continue.  Notably she also takes Advil daily for chronic lower extremity pain.  Denies chronic alcohol use.  No recent travel  On chart review, it appears she was seen at [facility] primary care on 12/11/2021 for diarrhea.  At that time she had had a 2-day history of fever, body aches and watery diarrhea.  She had a CT the day before  that showed no renal stones or any other acute intra-abdominal findings.  Labs at that time showed negative COVID, no leukocytosis or electrolyte dysfunction    Abdominal Pain Associated symptoms: dysuria, fatigue and nausea   Associated symptoms: no fever, no hematuria and no vomiting        Home Medications Prior to Admission medications   Medication Sig Start Date End Date Taking? Authorizing Provider  ciprofloxacin (CIPRO) 500 MG tablet Take 0.5 tablets (250 mg total) by mouth every 12 (twelve) hours for 3 days. 12/16/21 12/19/21 Yes Mickie Hillier, PA-C  ondansetron (ZOFRAN) 4 MG tablet Take 1 tablet (4 mg total) by mouth every 6 (six) hours. 12/16/21  Yes Mickie Hillier, PA-C  albuterol (VENTOLIN HFA) 108 (90 Base) MCG/ACT inhaler INHALE 1 TO 2 PUFFS INTO THE LUNGS EVERY 6 HOURS AS NEEDED FOR WHEEZING OR SHORTNESS OF BREATH 09/09/21   Hoyt Koch, MD  aspirin-acetaminophen-caffeine (EXCEDRIN MIGRAINE) [phone] MG tablet Take 1 tablet by mouth every 6 (six) hours as needed for headache.    [provider]  ibuprofen (ADVIL) 200 MG tablet Take 200 mg by mouth every 6 (six) hours as needed for headache or mild pain.    [provider]  levothyroxine (SYNTHROID) 175 MCG tablet TAKE 1 TABLET(175 MCG) BY MOUTH DAILY 11/19/21   Hoyt Koch, MD  losartan (COZAAR) 50 MG tablet TAKE 1 TABLET(50 MG) BY MOUTH  DAILY 09/03/21   Hoyt Koch, MD  mupirocin ointment (BACTROBAN) 2 % Apply 1 application topically daily. 01/28/21   Lavonna Monarch, MD  pantoprazole (PROTONIX) 40 MG tablet Take 1 tablet (40 mg total) by mouth daily. 12/08/21   Hoyt Koch, MD  sucralfate (CARAFATE) 1 g tablet Take 1 tablet (1 g total) by mouth 4 (four) times daily -  with meals and at bedtime. 04/17/21   Hoyt Koch, MD      Allergies    Penicillins, Amlodipine, Lisinopril, Metformin and related, and Bactrim ds [sulfamethoxazole-trimethoprim]    Review of  Systems   Review of Systems  Constitutional:  Positive for fatigue. Negative for fever.  Gastrointestinal:  Positive for abdominal pain and nausea. Negative for blood in stool and vomiting.  Genitourinary:  Positive for decreased urine volume and dysuria. Negative for frequency and hematuria.  All other systems reviewed and are negative.   Physical Exam Updated Vital Signs BP (!) 155/83   Pulse 60   Temp 98.4 F (36.9 C) (Oral)   Resp 17   Ht 5' 2.5" (1.588 m)   Wt 93 kg   SpO2 95%   BMI 36.90 kg/m  Physical Exam Vitals and nursing note reviewed.  Constitutional:      General: She is not in acute distress.    Appearance: Normal appearance. She is well-developed. She is obese. She is ill-appearing. She is not toxic-appearing.  HENT:     Head: Normocephalic and atraumatic.     Nose: No congestion.     Mouth/Throat:     Mouth: Mucous membranes are moist.     Pharynx: Oropharynx is clear.  Eyes:     General: No scleral icterus.    Extraocular Movements: Extraocular movements intact.     Pupils: Pupils are equal, round, and reactive to light.  Cardiovascular:     Rate and Rhythm: Normal rate and regular rhythm.     Pulses: Normal pulses.     Heart sounds: Normal heart sounds. No murmur heard. Pulmonary:     Effort: Pulmonary effort is normal. No respiratory distress.     Breath sounds: Normal breath sounds.  Abdominal:     General: Abdomen is protuberant. Bowel sounds are normal. There is no distension.     Palpations: Abdomen is soft.     Tenderness: There is abdominal tenderness in the left upper quadrant and left lower quadrant. There is no right CVA tenderness, left CVA tenderness, guarding or rebound.  Musculoskeletal:        General: No swelling. Normal range of motion.     Cervical back: Neck supple.  Skin:    General: Skin is warm and dry.     Capillary Refill: Capillary refill takes less than 2 seconds.     Findings: No rash.  Neurological:     General: No  focal deficit present.     Mental Status: She is alert and oriented to person, place, and time. Mental status is at baseline.  Psychiatric:        Mood and Affect: Mood normal.        Behavior: Behavior normal.        Thought Content: Thought content normal.        Judgment: Judgment normal.     ED Results / Procedures / Treatments   Labs (all labs ordered are listed, but only abnormal results are displayed) Labs Reviewed  COMPREHENSIVE METABOLIC PANEL - Abnormal; Notable for the following components:  Result Value   Glucose, Bld 140 (*)    Calcium 8.6 (*)    All other components within normal limits  CBC WITH DIFFERENTIAL/PLATELET - Abnormal; Notable for the following components:   Abs Immature Granulocytes 0.12 (*)    All other components within normal limits  URINALYSIS, ROUTINE W REFLEX MICROSCOPIC - Abnormal; Notable for the following components:   Leukocytes,Ua SMALL (*)    All other components within normal limits  URINALYSIS, MICROSCOPIC (REFLEX) - Abnormal; Notable for the following components:   Bacteria, UA FEW (*)    All other components within normal limits  URINE CULTURE  LIPASE, BLOOD  CBG MONITORING, ED   EKG None  Radiology No results found.  Procedures Procedures    Medications Ordered in ED Medications  ondansetron (ZOFRAN) injection 4 mg (4 mg Intravenous Given 12/16/21 1908)  lactated ringers bolus 1,000 mL ( Intravenous Stopped 12/16/21 2006)  pantoprazole (PROTONIX) injection 40 mg (40 mg Intravenous Given 12/16/21 1911)    ED Course/ Medical Decision Making/ A&P                           Medical Decision Making Amount and/or Complexity of Data Reviewed Labs: ordered.  Risk Prescription drug management.  This patient presents to the ED with chief complaint(s) of abdominal pain with pertinent past medical history of pulmonary sarcoid, hypertension, eosinophilic esophagitis, diabetes which further complicates the presenting complaint.  The complaint involves an extensive differential diagnosis and also carries with it a high risk of complications and morbidity.    The differential diagnosis includes Acute hepatobiliary disease, pancreatitis, appendicitis, PUD, gastritis, SBO, diverticulitis, colitis, viral gastroenteritis, Crohn's, UC, vascular catastrophe, UTI, pyelonephritis, renal stone, obstructed stone, infected stone, ovarian torsion, ectopic pregnancy, TOA, PID, STD, etc.    Additional history obtained: Additional history obtained from  none available Records reviewed Care Everywhere/External Records and Primary Care Documents  ED Course and Reassessment: 66 year old female who presents to the emergency department with abdominal pain. Physical exam is relatively unremarkable.  She is nontender on exam although she points to her left upper and left lower quadrant.  There is no CVA tenderness.  She has no respiratory symptoms.  She has active bowel sounds. Labs are unremarkable Given her lab work-up, physical exam and history of present illness doubt acute hepatobiliary disease.  Lipase negative, doubt acute pancreatitis.  Symptoms are inconsistent with an appendicitis, SBO, viral gastroenteritis, Crohn's, UC, vascular catastrophe, renal stone, obstructive stone, infected stone.  Symptoms inconsistent with an ovarian torsion.  Doubt ectopic.  No PID symptoms are concerning for STD.  Doubt TOA. She does have evidence of possible UTI.  I sent off a urine culture.  And I will treat her for UTI with Cipro given her penicillin allergy.  There is no documentation of cephalosporin use.  Symptoms are inconsistent with pyelonephritis though.  She has no fever, leukocytosis or CVA tenderness. Symptoms are inconsistent with a diverticulitis at this time.  She has no left lower quadrant specific tenderness or bloody diarrhea, fever or leukocytosis. Symptoms may also be consistent with a peptic ulcer disease.  She does take Motrin daily.   It appears that her previous visit on 12/08/2021 they prescribed her Protonix and Carafate.  I am in agreement with this plan.  We had shared decision making regarding proceeding with CT abdomen pelvis with contrast.  She was recently scanned without contrast and had no acute findings.  The patient would rather try  medications and follow-up with strict return precautions if she has worsening symptoms.  I agree with this plan.  I will prescribe her ciprofloxacin for the UTI as well as Zofran for nausea.  She has Protonix and Carafate at home.  Given her strict return precautions for worsening symptoms but I have very low suspicion for an acute abdomen.  Feel that she is safe for discharge at this time.  Independent labs interpretation:  The following labs were independently interpreted:  CBC within normal limits CMP with mildly elevated glucose, otherwise no transaminitis, AKI or electrolyte derangement Lipase 27, negative UA with small leukocytes, few bacteria, WBC 6-10.  Independent visualization of imaging: - I independently visualized the following imaging with scope of interpretation limited to determining acute life threatening conditions related to emergency care: N/A  Consultation: - Consulted or discussed management/test interpretation w/ external professional: N/A  Consideration for admission or further workup: Considered CT abdomen pelvis with contrast.  After shared decision making we will trial medications and return precautions Social Determinants of health: None identified Final Clinical Impression(s) / ED Diagnoses Final diagnoses:  Left upper quadrant abdominal pain    Rx / DC Orders ED Discharge Orders          Ordered    ciprofloxacin (CIPRO) 500 MG tablet  Every 12 hours        12/16/21 2117    ondansetron (ZOFRAN) 4 MG tablet  Every 6 hours        12/16/21 2117              Mickie Hillier, PA-C 12/16/21 2153    Deno Etienne, DO 12/16/21 2337

## 2021-12-17 LAB — URINE CULTURE: Culture: <10000 — AB

## 2021-12-29 ENCOUNTER — Telehealth (INDEPENDENT_AMBULATORY_CARE_PROVIDER_SITE_OTHER): Payer: Medicare Other | Admitting: Family Medicine

## 2021-12-29 ENCOUNTER — Encounter: Payer: Self-pay | Admitting: Family Medicine

## 2021-12-29 DIAGNOSIS — Z8619 Personal history of other infectious and parasitic diseases: Secondary | ICD-10-CM | POA: Diagnosis not present

## 2021-12-29 DIAGNOSIS — R197 Diarrhea, unspecified: Secondary | ICD-10-CM | POA: Diagnosis not present

## 2021-12-29 DIAGNOSIS — Z86010 Personal history of colonic polyps: Secondary | ICD-10-CM | POA: Diagnosis not present

## 2021-12-29 LAB — CBC WITH DIFFERENTIAL/PLATELET
Basophils Absolute: 0.1 10*3/uL (ref 0.0–0.1)
Basophils Relative: 0.9 % (ref 0.0–3.0)
Eosinophils Absolute: 0.1 10*3/uL (ref 0.0–0.7)
Eosinophils Relative: 1.8 % (ref 0.0–5.0)
HCT: 44.5 % (ref 36.0–46.0)
Hemoglobin: 14.7 g/dL (ref 12.0–15.0)
Lymphocytes Relative: 21.9 % (ref 12.0–46.0)
Lymphs Abs: 1.8 10*3/uL (ref 0.7–4.0)
MCHC: 33 g/dL (ref 30.0–36.0)
MCV: 85.4 fl (ref 78.0–100.0)
Monocytes Absolute: 0.5 10*3/uL (ref 0.1–1.0)
Monocytes Relative: 5.9 % (ref 3.0–12.0)
Neutro Abs: 5.8 10*3/uL (ref 1.4–7.7)
Neutrophils Relative %: 69.5 % (ref 43.0–77.0)
Platelets: 262 10*3/uL (ref 150.0–400.0)
RBC: 5.21 Mil/uL — ABNORMAL HIGH (ref 3.87–5.11)
RDW: 14.5 % (ref 11.5–15.5)
WBC: 8.3 10*3/uL (ref 4.0–10.5)

## 2021-12-29 LAB — COMPREHENSIVE METABOLIC PANEL
ALT: 24 U/L (ref 0–35)
AST: 20 U/L (ref 0–37)
Albumin: 4 g/dL (ref 3.5–5.2)
Alkaline Phosphatase: 89 U/L (ref 39–117)
BUN: 17 mg/dL (ref 6–23)
CO2: 27 mEq/L (ref 19–32)
Calcium: 10.1 mg/dL (ref 8.4–10.5)
Chloride: 103 mEq/L (ref 96–112)
Creatinine, Ser: 0.81 mg/dL (ref 0.40–1.20)
GFR: 74.91 mL/min (ref >60.00)
Glucose, Bld: 152 mg/dL — ABNORMAL HIGH (ref 70–99)
Potassium: 4.3 mEq/L (ref 3.5–5.1)
Sodium: 138 mEq/L (ref 135–145)
Total Bilirubin: 0.6 mg/dL (ref 0.2–1.2)
Total Protein: 7.5 g/dL (ref 6.0–8.3)

## 2021-12-29 NOTE — Progress Notes (Signed)
Virtual telephone visit    Virtual Visit via Telephone Note   This visit type was conducted due to national recommendations for restrictions regarding the COVID-19 Pandemic (e.g. social distancing) in an effort to limit this patient's exposure and mitigate transmission in our community. Due to her co-morbid illnesses, this patient is at least at moderate risk for complications without adequate follow up. This format is felt to be most appropriate for this patient at this time. The patient did not have access to video technology or had technical difficulties with video requiring transitioning to audio format only (telephone). Physical exam was limited to content and character of the telephone converstion. CMA was able to get the patient set up on a telephone visit.   Patient location: Home. Patient and provider in visit Provider location: Office  I discussed the limitations of evaluation and management by telemedicine and the availability of in person appointments. The patient expressed understanding and agreed to proceed.   Visit Date: 12/29/2021  Today's healthcare provider: Harland Dingwall, NP-C     Subjective:    Patient ID: Katrina [name], female    DOB: 23-Feb-1956, 66 y.o.   MRN: [id number]  Chief Complaint  Patient presents with   Diarrhea    For about 3 weeks    HPI  This is a telephone visit due to the patient not being able to get her video working today. C/o diarrhea x 3 weeks. Watery, no blood.  States it was better last week and is now having diarrhea as bad as when it first started 3 weeks ago.  Hx of C-diff.   Taking Pepto Bismol and Imodium.    She was prescribed Zofran and Cipro for ?UTI in the ED 2 weeks ago for LUQ abd pain. Given IV fluids.  Negative CT renal stone study and negative Covid. Normal WBC count.  Urine culture was negative.  States she did not take the antibiotic.   Denies fever, chills, dizziness, chest pain, palpitations, shortness of  breath, abdominal pain, vomiting, urinary symptoms, LE edema.        Past Medical History:  Diagnosis Date   BCC (basal cell carcinoma of skin) Infundibulocystic 03/11/2008   Inner Left Eye   Chest pain at rest 11/07/2012   Diabetes mellitus without complication (HCC)    Eosinophilic esophagitis 78/29/5621   Esophageal stricture    Exercise-induced asthma    GERD (gastroesophageal reflux disease)    Hx of colonic polyps 03/2020   Hypertension 09/27/2017   Hypothyroidism    Sarcoidosis    Shortness of breath 11/08/2012   RECENTLY  & WHEN i EXERCISE"   Solar lentigo 03/11/2008   Back - Atypical   Thyroid disease    Upper airway cough syndrome     Past Surgical History:  Procedure Laterality Date   ABDOMINAL HYSTERECTOMY     Partial   ESOPHAGOGASTRODUODENOSCOPY (EGD) WITH ESOPHAGEAL DILATION N/A 11/10/2012   Procedure: ESOPHAGOGASTRODUODENOSCOPY (EGD) WITH ESOPHAGEAL DILATION;  Surgeon: Gatha Mayer, MD;  Location: [facility];  Service: Endoscopy;  Laterality: N/A;  Maloney vs. Balloon   VIDEO BRONCHOSCOPY WITH ENDOBRONCHIAL ULTRASOUND Bilateral 01/15/2015   Procedure: VIDEO BRONCHOSCOPY WITH ENDOBRONCHIAL ULTRASOUND of  LEFT LUNG LOWER LOBE and right lung;  Surgeon: Collene Gobble, MD;  Location: [facility];  Service: Thoracic;  Laterality: Bilateral;    Family History  Problem Relation Age of Onset   Diabetes Mother    Stroke Mother    Coronary artery disease Father  Coronary artery disease Brother    Hypothyroidism Maternal Grandmother    Diabetes Maternal Grandfather    Heart disease Paternal Grandmother    Heart disease Paternal Grandfather     Social History   Socioeconomic History   Marital status: Divorced    Spouse name: Not on file   Number of children: 3   Years of education: 14   Highest education level: Not on file  Occupational History   Occupation: Unemployed  Tobacco Use   Smoking status: Never   Smokeless tobacco: Never  Vaping Use   Vaping  Use: Never used  Substance and Sexual Activity   Alcohol use: No   Drug use: No   Sexual activity: Not on file  Other Topics Concern   Not on file  Social History Narrative   Fun: Anything and everything   Social Determinants of Health   Financial Resource Strain: Low Risk  (12/29/2020)   Overall Financial Resource Strain (CARDIA)    Difficulty of Paying Living Expenses: Not hard at all  Food Insecurity: No Food Insecurity (12/29/2020)   Hunger Vital Sign    Worried About Running Out of Food in the Last Year: Never true    Ran Out of Food in the Last Year: Never true  Transportation Needs: No Transportation Needs (12/29/2020)   PRAPARE - Hydrologist (Medical): No    Lack of Transportation (Non-Medical): No  Physical Activity: Sufficiently Active (12/29/2020)   Exercise Vital Sign    Days of Exercise per Week: 5 days    Minutes of Exercise per Session: 30 min  Stress: No Stress Concern Present (12/29/2020)   [place]    Feeling of Stress : Not at all  Social Connections: Moderately Integrated (12/29/2020)   Social Connection and Isolation Panel [NHANES]    Frequency of Communication with Friends and Family: More than three times a week    Frequency of Social Gatherings with Friends and Family: Once a week    Attends Religious Services: More than 4 times per year    Active Member of [employer] or Organizations: Yes    Attends Archivist Meetings: More than 4 times per year    Marital Status: Divorced  Intimate Partner Violence: Not At Risk (12/29/2020)   Humiliation, Afraid, Rape, and Kick questionnaire    Fear of Current or Ex-Partner: No    Emotionally Abused: No    Physically Abused: No    Sexually Abused: No    Outpatient Medications Prior to Visit  Medication Sig Dispense Refill   albuterol (VENTOLIN HFA) 108 (90 Base) MCG/ACT inhaler INHALE 1 TO 2 PUFFS INTO THE LUNGS  EVERY 6 HOURS AS NEEDED FOR WHEEZING OR SHORTNESS OF BREATH 6.7 g 1   aspirin-acetaminophen-caffeine (EXCEDRIN MIGRAINE) 250-250-65 MG tablet Take 1 tablet by mouth every 6 (six) hours as needed for headache.     ibuprofen (ADVIL) 200 MG tablet Take 200 mg by mouth every 6 (six) hours as needed for headache or mild pain.     levothyroxine (SYNTHROID) 175 MCG tablet TAKE 1 TABLET(175 MCG) BY MOUTH DAILY 90 tablet 0   losartan (COZAAR) 50 MG tablet TAKE 1 TABLET(50 MG) BY MOUTH DAILY 90 tablet 0   mupirocin ointment (BACTROBAN) 2 % Apply 1 application topically daily. 22 g 0   ondansetron (ZOFRAN) 4 MG tablet Take 1 tablet (4 mg total) by mouth every 6 (six) hours. 12 tablet 0  pantoprazole (PROTONIX) 40 MG tablet Take 1 tablet (40 mg total) by mouth daily. 30 tablet 3   sucralfate (CARAFATE) 1 g tablet Take 1 tablet (1 g total) by mouth 4 (four) times daily -  with meals and at bedtime. 60 tablet 0   No facility-administered medications prior to visit.    Allergies  Allergen Reactions   Penicillins Shortness Of Breath and Rash    DID THE REACTION INVOLVE: Swelling of the face/tongue/throat, SOB, or low BP? N Sudden or severe rash/hives, skin peeling, or the inside of the mouth or nose? Yes Did it require medical treatment? No When did it last happen?   2010  If all above answers are "NO", may proceed with cephalosporin use.    Amlodipine     Hives Other reaction(s): Other (See Comments) Hives   Lisinopril     Weakness/ muscle aches   Metformin And Related     Elevated heart rate   Bactrim Ds [Sulfamethoxazole-Trimethoprim] Rash    Upper body and Face redness with heat.      ROS     Objective:    Physical Exam  There were no vitals taken for this visit. Wt Readings from Last 3 Encounters:  12/16/21 205 lb (93 kg)  12/11/21 205 lb (93 kg)  12/08/21 209 lb (94.8 kg)    Alert and oriented.      Assessment & Plan:   Problem List Items Addressed This Visit        Other   Diarrhea - Primary   Relevant Orders   GI Profile, Stool, PCR   CBC with Differential/Platelet (Completed)   Comprehensive metabolic panel (Completed)   Recommend she come in for labs and stool studies today.  Follow-up with me in 2 days for in office evaluation. If she needs to be seen sooner she will call back.   I am having Jonie L. Trzcinski maintain her ibuprofen, aspirin-acetaminophen-caffeine, mupirocin ointment, sucralfate, losartan, albuterol, levothyroxine, pantoprazole, and ondansetron.  No orders of the defined types were placed in this encounter.    I discussed the assessment and treatment plan with the patient. The patient was provided an opportunity to ask questions and all were answered. The patient agreed with the plan and demonstrated an understanding of the instructions.   The patient was advised to call back or seek an in-person evaluation if the symptoms worsen or if the condition fails to improve as anticipated.  I provided 14 minutes of non-face-to-face time during this encounter.   Harland Dingwall, NP-C [facility] [phone] (phone) [phone] (fax)  [place]

## 2021-12-30 ENCOUNTER — Telehealth: Payer: Self-pay | Admitting: Internal Medicine

## 2021-12-30 NOTE — Telephone Encounter (Signed)
PT calls today in regards to checking on labs. I had let PT know that we had received everything but the GI results after conversing with Harland Dingwall NP and Jarrett Soho. PT would also like to note that she is still dealing with extreme fatigue, finding herself just sleeping the day away. She is also having issues with nausea, will feel hungry but as soon as she goes to eat she just feels sick.  CB if needed: [phone]

## 2021-12-31 ENCOUNTER — Ambulatory Visit (INDEPENDENT_AMBULATORY_CARE_PROVIDER_SITE_OTHER): Payer: Medicare Other | Admitting: Family Medicine

## 2021-12-31 ENCOUNTER — Ambulatory Visit: Payer: Medicare Other

## 2021-12-31 ENCOUNTER — Ambulatory Visit (INDEPENDENT_AMBULATORY_CARE_PROVIDER_SITE_OTHER)
Admission: RE | Admit: 2021-12-31 | Discharge: 2021-12-31 | Disposition: A | Discharge status: Home / Self Care | Payer: Medicare Other | Source: Ambulatory Visit | Attending: Family Medicine | Admitting: Family Medicine

## 2021-12-31 ENCOUNTER — Other Ambulatory Visit (INDEPENDENT_AMBULATORY_CARE_PROVIDER_SITE_OTHER): Payer: Medicare Other

## 2021-12-31 ENCOUNTER — Encounter: Payer: Self-pay | Admitting: Family Medicine

## 2021-12-31 VITALS — BP 142/94 | HR 80 | Temp 97.6°F | Ht 62.5 in | Wt 202.0 lb

## 2021-12-31 DIAGNOSIS — R109 Unspecified abdominal pain: Secondary | ICD-10-CM

## 2021-12-31 DIAGNOSIS — R5383 Other fatigue: Secondary | ICD-10-CM

## 2021-12-31 DIAGNOSIS — R110 Nausea: Secondary | ICD-10-CM

## 2021-12-31 DIAGNOSIS — E039 Hypothyroidism, unspecified: Secondary | ICD-10-CM

## 2021-12-31 DIAGNOSIS — R197 Diarrhea, unspecified: Secondary | ICD-10-CM

## 2021-12-31 MED ORDER — ONDANSETRON 4 MG PO TBDP: 4.0000 mg | ORAL_TABLET | Freq: Three times a day (TID) | ORAL | 0 refills | Status: DC | PRN

## 2021-12-31 NOTE — Telephone Encounter (Signed)
FYI... pt coming in this afternoon

## 2021-12-31 NOTE — Progress Notes (Unsigned)
Subjective:     Patient ID: Katrina [name], female    DOB: 06/23/1955, 66 y.o.   MRN: [id number]  Chief Complaint  Patient presents with   Follow-up    F/u from [facility], still dealing with diarrhea, fatigue and is feeling nauseated. States Katrina [name] is only eating 1 meal a day and if Katrina [name] eats more Katrina [name] gets nauseated     HPI Patient is in today for 4 week hx of fatigue and diarrhea  Katrina [name] wakes up hungry. If Katrina [name] eats more than a small meal Katrina [name] has nausea.   Having diarrhea several times per day (10+). With Imodium improves her diarrhea to 3 times per day.   No blood   Colonoscopy 2 years ago with Dr. Carlean Purl.   No acute findings on CT renal stone done on 12/10/2021. Fatty liver seen  Requests to have thyroid level checked. Taking levothyroxine.    Denies fever, chills, dizziness, chest pain, palpitations, shortness of breath, abdominal pain, N/V/D, urinary symptoms, LE edema.     Health Maintenance Due  Topic Date Due   OPHTHALMOLOGY EXAM  Never done   DEXA SCAN  Never done   FOOT EXAM  10/01/2021    Past Medical History:  Diagnosis Date   BCC (basal cell carcinoma of skin) Infundibulocystic 03/11/2008   Inner Left Eye   Chest pain at rest 11/07/2012   Diabetes mellitus without complication (HCC)    Eosinophilic esophagitis 69/48/5462   Esophageal stricture    Exercise-induced asthma    GERD (gastroesophageal reflux disease)    Hx of colonic polyps 03/2020   Hypertension 09/27/2017   Hypothyroidism    Sarcoidosis    Shortness of breath 11/08/2012   RECENTLY  & WHEN i EXERCISE"   Solar lentigo 03/11/2008   Back - Atypical   Thyroid disease    Upper airway cough syndrome     Past Surgical History:  Procedure Laterality Date   ABDOMINAL HYSTERECTOMY     Partial   ESOPHAGOGASTRODUODENOSCOPY (EGD) WITH ESOPHAGEAL DILATION N/A 11/10/2012   Procedure: ESOPHAGOGASTRODUODENOSCOPY (EGD) WITH ESOPHAGEAL DILATION;  Surgeon: Gatha Mayer, MD;  Location: [facility];  Service:  Endoscopy;  Laterality: N/A;  Maloney vs. Balloon   VIDEO BRONCHOSCOPY WITH ENDOBRONCHIAL ULTRASOUND Bilateral 01/15/2015   Procedure: VIDEO BRONCHOSCOPY WITH ENDOBRONCHIAL ULTRASOUND of  LEFT LUNG LOWER LOBE and right lung;  Surgeon: Collene Gobble, MD;  Location: [facility];  Service: Thoracic;  Laterality: Bilateral;    Family History  Problem Relation Age of Onset   Diabetes Mother    Stroke Mother    Coronary artery disease Father    Coronary artery disease Brother    Hypothyroidism Maternal Grandmother    Diabetes Maternal Grandfather    Heart disease Paternal Grandmother    Heart disease Paternal Grandfather     Social History   Socioeconomic History   Marital status: Divorced    Spouse name: Not on file   Number of children: 3   Years of education: 14   Highest education level: Not on file  Occupational History   Occupation: Unemployed  Tobacco Use   Smoking status: Never   Smokeless tobacco: Never  Vaping Use   Vaping Use: Never used  Substance and Sexual Activity   Alcohol use: No   Drug use: No   Sexual activity: Not on file  Other Topics Concern   Not on file  Social History Narrative   Fun: Anything and everything   Social Determinants of Health  Financial Resource Strain: Low Risk  (01/01/2022)   Overall Financial Resource Strain (CARDIA)    Difficulty of Paying Living Expenses: Not hard at all  Food Insecurity: No Food Insecurity (01/01/2022)   Hunger Vital Sign    Worried About Running Out of Food in the Last Year: Never true    Ran Out of Food in the Last Year: Never true  Transportation Needs: No Transportation Needs (01/01/2022)   PRAPARE - Hydrologist (Medical): No    Lack of Transportation (Non-Medical): No  Physical Activity: Sufficiently Active (01/01/2022)   Exercise Vital Sign    Days of Exercise per Week: 7 days    Minutes of Exercise per Session: 30 min  Stress: No Stress Concern Present (01/01/2022)   [place]    Feeling of Stress : Not at all  Social Connections: Moderately Isolated (01/01/2022)   Social Connection and Isolation Panel [NHANES]    Frequency of Communication with Friends and Family: More than three times a week    Frequency of Social Gatherings with Friends and Family: More than three times a week    Attends Religious Services: More than 4 times per year    Active Member of [employer] or Organizations: No    Attends Archivist Meetings: Never    Marital Status: Divorced  Human resources officer Violence: Not At Risk (01/01/2022)   Humiliation, Afraid, Rape, and Kick questionnaire    Fear of Current or Ex-Partner: No    Emotionally Abused: No    Physically Abused: No    Sexually Abused: No    Outpatient Medications Prior to Visit  Medication Sig Dispense Refill   albuterol (VENTOLIN HFA) 108 (90 Base) MCG/ACT inhaler INHALE 1 TO 2 PUFFS INTO THE LUNGS EVERY 6 HOURS AS NEEDED FOR WHEEZING OR SHORTNESS OF BREATH 6.7 g 1   aspirin-acetaminophen-caffeine (EXCEDRIN MIGRAINE) 250-250-65 MG tablet Take 1 tablet by mouth every 6 (six) hours as needed for headache.     ibuprofen (ADVIL) 200 MG tablet Take 200 mg by mouth every 6 (six) hours as needed for headache or mild pain.     levothyroxine (SYNTHROID) 175 MCG tablet TAKE 1 TABLET(175 MCG) BY MOUTH DAILY 90 tablet 0   losartan (COZAAR) 50 MG tablet TAKE 1 TABLET(50 MG) BY MOUTH DAILY (Patient not taking: Reported on 01/01/2022) 90 tablet 0   mupirocin ointment (BACTROBAN) 2 % Apply 1 application topically daily. (Patient not taking: Reported on 01/01/2022) 22 g 0   ondansetron (ZOFRAN) 4 MG tablet Take 1 tablet (4 mg total) by mouth every 6 (six) hours. 12 tablet 0   pantoprazole (PROTONIX) 40 MG tablet Take 1 tablet (40 mg total) by mouth daily. 30 tablet 3   sucralfate (CARAFATE) 1 g tablet Take 1 tablet (1 g total) by mouth 4 (four) times daily -  with meals and at  bedtime. 60 tablet 0   No facility-administered medications prior to visit.    Allergies  Allergen Reactions   Penicillins Shortness Of Breath and Rash    DID THE REACTION INVOLVE: Swelling of the face/tongue/throat, SOB, or low BP? N Sudden or severe rash/hives, skin peeling, or the inside of the mouth or nose? Yes Did it require medical treatment? No When did it last happen?   2010  If all above answers are "NO", may proceed with cephalosporin use.    Amlodipine     Hives Other reaction(s): Other (See Comments)  Hives   Lisinopril     Weakness/ muscle aches   Metformin And Related     Elevated heart rate   Bactrim Ds [Sulfamethoxazole-Trimethoprim] Rash    Upper body and Face redness with heat.      ROS     Objective:    Physical Exam Constitutional:      General: Katrina [name] is not in acute distress.    Appearance: Katrina [name] is not ill-appearing.  HENT:     Mouth/Throat:     Mouth: Mucous membranes are moist.  Eyes:     Conjunctiva/sclera: Conjunctivae normal.     Pupils: Pupils are equal, round, and reactive to light.  Cardiovascular:     Rate and Rhythm: Normal rate and regular rhythm.  Pulmonary:     Effort: Pulmonary effort is normal.     Breath sounds: Normal breath sounds.  Abdominal:     General: Bowel sounds are increased. There is no distension.     Palpations: Abdomen is soft.     Tenderness: There is no abdominal tenderness. There is no right CVA tenderness, left CVA tenderness, guarding or rebound.  Musculoskeletal:     Cervical back: Normal range of motion and neck supple.  Skin:    General: Skin is warm and dry.  Neurological:     General: No focal deficit present.     Mental Status: Katrina [name] is alert and oriented to person, place, and time.     Cranial Nerves: No cranial nerve deficit.     Sensory: No sensory deficit.     Motor: No weakness.     Coordination: Coordination normal.  Psychiatric:        Mood and Affect: Mood normal.        Behavior: Behavior  normal.     BP (!) 142/94 (BP Location: Left Arm, Patient Position: Sitting, Cuff Size: Large)   Pulse 80   Temp 97.6 F (36.4 C) (Temporal)   Ht 5' 2.5" (1.588 m)   Wt 202 lb (91.6 kg)   SpO2 96%   BMI 36.36 kg/m  Wt Readings from Last 3 Encounters:  01/01/22 202 lb (91.6 kg)  12/31/21 202 lb (91.6 kg)  12/16/21 205 lb (93 kg)       Assessment & Plan:   Problem List Items Addressed This Visit       Endocrine   Hypothyroidism, acquired    TSH checked per patient request.       Relevant Orders   TSH (Completed)     Other   Diarrhea - Primary    Awaiting stool study results, Katrina [name] turned this in 2 days ago. Labs unremarkable 2 days ago.  Afebrile. Katrina [name] may continue taking Imodium since this is helping.  Referral to GI and f/u pending results of GI profile.       Relevant Orders   Ambulatory referral to Gastroenterology   DG Abd 2 Views (Completed)   Nausea    Zofran ODT prescribed.       Relevant Medications   ondansetron (ZOFRAN-ODT) 4 MG disintegrating tablet   Other fatigue   Relevant Orders   TSH (Completed)   Other Visit Diagnoses     Intermittent abdominal pain       Relevant Orders   Ambulatory referral to Gastroenterology   DG Abd 2 Views (Completed)       I am having Katrina [name] start on ondansetron. I am also having her maintain her ibuprofen, aspirin-acetaminophen-caffeine, mupirocin ointment, sucralfate, losartan, albuterol, levothyroxine,  pantoprazole, and ondansetron.  Meds ordered this encounter  Medications   ondansetron (ZOFRAN-ODT) 4 MG disintegrating tablet    Sig: Take 1 tablet (4 mg total) by mouth every 8 (eight) hours as needed for nausea or vomiting.    Dispense:  20 tablet    Refill:  0    Order Specific Question:   Supervising Provider    Answer:   Pricilla Holm A [[id number]]

## 2021-12-31 NOTE — Patient Instructions (Signed)
Please go downstairs for an abdominal X ray.   You will need to go to the [facility] location to have your TSH (blood test).   We will be in touch with your results.   Continue taking Imodium and try the Zofran for nausea.

## 2022-01-01 ENCOUNTER — Ambulatory Visit (INDEPENDENT_AMBULATORY_CARE_PROVIDER_SITE_OTHER): Payer: Medicare Other

## 2022-01-01 ENCOUNTER — Telehealth: Payer: Self-pay

## 2022-01-01 VITALS — Ht 62.0 in | Wt 202.0 lb

## 2022-01-01 DIAGNOSIS — Z0100 Encounter for examination of eyes and vision without abnormal findings: Secondary | ICD-10-CM | POA: Diagnosis not present

## 2022-01-01 DIAGNOSIS — Z780 Asymptomatic menopausal state: Secondary | ICD-10-CM | POA: Diagnosis not present

## 2022-01-01 DIAGNOSIS — Z0000 Encounter for general adult medical examination without abnormal findings: Secondary | ICD-10-CM | POA: Diagnosis not present

## 2022-01-01 LAB — GI PROFILE, STOOL, PCR

## 2022-01-01 LAB — TSH: TSH: 39.43 u[IU]/mL — ABNORMAL HIGH (ref 0.35–5.50)

## 2022-01-01 NOTE — Patient Instructions (Signed)
Katrina [name] , Thank you for taking time to come for your Medicare Wellness Visit. I appreciate your ongoing commitment to your health goals. Please review the following plan we discussed and let me know if I can assist you in the future.   Screening recommendations/referrals: Colonoscopy: 04/02/2020  due 03/2027 Mammogram: scheduled 03/2027 Bone Density: referral 01/01/2022 Recommended yearly ophthalmology/optometry visit for glaucoma screening and checkup Recommended yearly dental visit for hygiene and checkup  Vaccinations: Influenza vaccine: due in fall  Pneumococcal vaccine:  Tdap vaccine: due  Shingles vaccine: will consider    Covid-19:completed   Advanced directives: Please bring a copy of your health care power of attorney and living will to the office to be added to your chart at your convenience.   Conditions/risks identified: Aim for 30 minutes of exercise or brisk walking, 6-8 glasses of water, and 5 servings of fruits and vegetables each day.   Next appointment: Follow up in one year for your annual wellness visit    Preventive Care 65 Years and Older, Female Preventive care refers to lifestyle choices and visits with your health care provider that can promote health and wellness. What does preventive care include? A yearly physical exam. This is also called an annual well check. Dental exams once or twice a year. Routine eye exams. Ask your health care provider how often you should have your eyes checked. Personal lifestyle choices, including: Daily care of your teeth and gums. Regular physical activity. Eating a healthy diet. Avoiding tobacco and drug use. Limiting alcohol use. Practicing safe sex. Taking low-dose aspirin every day. Taking vitamin and mineral supplements as recommended by your health care provider. What happens during an annual well check? The services and screenings done by your health care provider during your annual well check will depend on  your age, overall health, lifestyle risk factors, and family history of disease. Counseling  Your health care provider may ask you questions about your: Alcohol use. Tobacco use. Drug use. Emotional well-being. Home and relationship well-being. Sexual activity. Eating habits. History of falls. Memory and ability to understand (cognition). Work and work Statistician. Reproductive health. Screening  You may have the following tests or measurements: Height, weight, and BMI. Blood pressure. Lipid and cholesterol levels. These may be checked every 5 years, or more frequently if you are over 72 years old. Skin check. Lung cancer screening. You may have this screening every year starting at age 37 if you have a 30-pack-year history of smoking and currently smoke or have quit within the past 15 years. Fecal occult blood test (FOBT) of the stool. You may have this test every year starting at age 26. Flexible sigmoidoscopy or colonoscopy. You may have a sigmoidoscopy every 5 years or a colonoscopy every 10 years starting at age 43. Hepatitis C blood test. Hepatitis B blood test. Sexually transmitted disease (STD) testing. Diabetes screening. This is done by checking your blood sugar (glucose) after you have not eaten for a while (fasting). You may have this done every 1-3 years. Bone density scan. This is done to screen for osteoporosis. You may have this done starting at age 13. Mammogram. This may be done every 1-2 years. Talk to your health care provider about how often you should have regular mammograms. Talk with your health care provider about your test results, treatment options, and if necessary, the need for more tests. Vaccines  Your health care provider may recommend certain vaccines, such as: Influenza vaccine. This is recommended every year. Tetanus,  diphtheria, and acellular pertussis (Tdap, Td) vaccine. You may need a Td booster every 10 years. Zoster vaccine. You may need this  after age 34. Pneumococcal 13-valent conjugate (PCV13) vaccine. One dose is recommended after age 66. Pneumococcal polysaccharide (PPSV23) vaccine. One dose is recommended after age 84. Talk to your health care provider about which screenings and vaccines you need and how often you need them. This information is not intended to replace advice given to you by your health care provider. Make sure you discuss any questions you have with your health care provider. Document Released: 05/02/2015 Document Revised: 12/24/2015 Document Reviewed: 02/04/2015 Elsevier Interactive Patient Education  2017 [place] Prevention in the Home Falls can cause injuries. They can happen to people of all ages. There are many things you can do to make your home safe and to help prevent falls. What can I do on the outside of my home? Regularly fix the edges of walkways and driveways and fix any cracks. Remove anything that might make you trip as you walk through a door, such as a raised step or threshold. Trim any bushes or trees on the path to your home. Use bright outdoor lighting. Clear any walking paths of anything that might make someone trip, such as rocks or tools. Regularly check to see if handrails are loose or broken. Make sure that both sides of any steps have handrails. Any raised decks and porches should have guardrails on the edges. Have any leaves, snow, or ice cleared regularly. Use sand or salt on walking paths during winter. Clean up any spills in your garage right away. This includes oil or grease spills. What can I do in the bathroom? Use night lights. Install grab bars by the toilet and in the tub and shower. Do not use towel bars as grab bars. Use non-skid mats or decals in the tub or shower. If you need to sit down in the shower, use a plastic, non-slip stool. Keep the floor dry. Clean up any water that spills on the floor as soon as it happens. Remove soap buildup in the tub or  shower regularly. Attach bath mats securely with double-sided non-slip rug tape. Do not have throw rugs and other things on the floor that can make you trip. What can I do in the bedroom? Use night lights. Make sure that you have a light by your bed that is easy to reach. Do not use any sheets or blankets that are too big for your bed. They should not hang down onto the floor. Have a firm chair that has side arms. You can use this for support while you get dressed. Do not have throw rugs and other things on the floor that can make you trip. What can I do in the kitchen? Clean up any spills right away. Avoid walking on wet floors. Keep items that you use a lot in easy-to-reach places. If you need to reach something above you, use a strong step stool that has a grab bar. Keep electrical cords out of the way. Do not use floor polish or wax that makes floors slippery. If you must use wax, use non-skid floor wax. Do not have throw rugs and other things on the floor that can make you trip. What can I do with my stairs? Do not leave any items on the stairs. Make sure that there are handrails on both sides of the stairs and use them. Fix handrails that are broken or loose. Make  sure that handrails are as long as the stairways. Check any carpeting to make sure that it is firmly attached to the stairs. Fix any carpet that is loose or worn. Avoid having throw rugs at the top or bottom of the stairs. If you do have throw rugs, attach them to the floor with carpet tape. Make sure that you have a light switch at the top of the stairs and the bottom of the stairs. If you do not have them, ask someone to add them for you. What else can I do to help prevent falls? Wear shoes that: Do not have high heels. Have rubber bottoms. Are comfortable and fit you well. Are closed at the toe. Do not wear sandals. If you use a stepladder: Make sure that it is fully opened. Do not climb a closed stepladder. Make  sure that both sides of the stepladder are locked into place. Ask someone to hold it for you, if possible. Clearly mark and make sure that you can see: Any grab bars or handrails. First and last steps. Where the edge of each step is. Use tools that help you move around (mobility aids) if they are needed. These include: Canes. Walkers. Scooters. Crutches. Turn on the lights when you go into a dark area. Replace any light bulbs as soon as they burn out. Set up your furniture so you have a clear path. Avoid moving your furniture around. If any of your floors are uneven, fix them. If there are any pets around you, be aware of where they are. Review your medicines with your doctor. Some medicines can make you feel dizzy. This can increase your chance of falling. Ask your doctor what other things that you can do to help prevent falls. This information is not intended to replace advice given to you by your health care provider. Make sure you discuss any questions you have with your health care provider. Document Released: 01/30/2009 Document Revised: 09/11/2015 Document Reviewed: 05/10/2014 Elsevier Interactive Patient Education  2017 [employer].

## 2022-01-01 NOTE — Progress Notes (Signed)
Please check with patient as to whether she has been taking her levothyroxine everyday or is she missing doses? Her TSH is significantly elevated as if she may not be taking it or something is going on with the medication. I recommend a follow up with Dr. Sharlet Salina for this and a recheck of her thyroid function (4 weeks if she has not been taking it daily and will start or sooner if she has been taking it as prescribed).

## 2022-01-01 NOTE — Telephone Encounter (Signed)
Called pt and relayed results. Pt verbalized understanding.

## 2022-01-01 NOTE — Telephone Encounter (Signed)
After discussing lab results and scheduling appt to discuss treatment with PCP, pt was wondering if she could get her results from her stool study on 9/12? I see that it was resulted but was not advised on and was wondering if you could help with interpreting this as Loletha Carrow is out of office until Tuesday?

## 2022-01-01 NOTE — Telephone Encounter (Signed)
Stool studies normal no infection detected.

## 2022-01-01 NOTE — Progress Notes (Signed)
Subjective:   Katrina [name] is a 66 y.o. female who presents for Medicare Annual (Subsequent) preventive examination.   Virtual Visit via Telephone Note  I connected with  Katrina [name] on 01/01/22 at  3:30 PM EDT by telephone and verified that I am speaking with the correct person using two identifiers.  Location: Patient: home  Provider: crawford Persons participating in the virtual visit: patient/Nurse Health Advisor   I discussed the limitations, risks, security and privacy concerns of performing an evaluation and management service by telephone and the availability of in person appointments. The patient expressed understanding and agreed to proceed.  Interactive audio and video telecommunications were attempted between this nurse and patient, however failed, due to patient having technical difficulties OR patient did not have access to video capability.  We continued and completed visit with audio only.  Some vital signs may be absent or patient reported.   Daphane Shepherd, LPN  Review of Systems     Cardiac Risk Factors include: advanced age (>69mn, >>23women)     Objective:    Today's Vitals   01/01/22 1545  Weight: 202 lb (91.6 kg)  Height: '5\' 2"'$  (1.575 m)   Body mass index is 36.95 kg/m.     01/01/2022    3:50 PM 12/16/2021    6:27 PM 12/29/2020   10:54 AM 10/10/2020    2:47 PM 10/17/2019    9:12 PM 12/06/2018    5:30 PM 09/24/2018   10:44 PM  Advanced Directives  Does Patient Have a Medical Advance Directive? No No No No No No No  Would patient like information on creating a medical advance directive? No - Patient declined  Yes (MAU/Ambulatory/Procedural Areas - Information given) No - Patient declined No - Patient declined  No - Patient declined    Current Medications (verified) Outpatient Encounter Medications as of 01/01/2022  Medication Sig   albuterol (VENTOLIN HFA) 108 (90 Base) MCG/ACT inhaler INHALE 1 TO 2 PUFFS INTO THE LUNGS EVERY 6 HOURS AS NEEDED  FOR WHEEZING OR SHORTNESS OF BREATH   aspirin-acetaminophen-caffeine (EXCEDRIN MIGRAINE) 250-250-65 MG tablet Take 1 tablet by mouth every 6 (six) hours as needed for headache.   ibuprofen (ADVIL) 200 MG tablet Take 200 mg by mouth every 6 (six) hours as needed for headache or mild pain.   levothyroxine (SYNTHROID) 175 MCG tablet TAKE 1 TABLET(175 MCG) BY MOUTH DAILY   ondansetron (ZOFRAN) 4 MG tablet Take 1 tablet (4 mg total) by mouth every 6 (six) hours.   ondansetron (ZOFRAN-ODT) 4 MG disintegrating tablet Take 1 tablet (4 mg total) by mouth every 8 (eight) hours as needed for nausea or vomiting.   pantoprazole (PROTONIX) 40 MG tablet Take 1 tablet (40 mg total) by mouth daily.   sucralfate (CARAFATE) 1 g tablet Take 1 tablet (1 g total) by mouth 4 (four) times daily -  with meals and at bedtime.   losartan (COZAAR) 50 MG tablet TAKE 1 TABLET(50 MG) BY MOUTH DAILY (Patient not taking: Reported on 01/01/2022)   mupirocin ointment (BACTROBAN) 2 % Apply 1 application topically daily. (Patient not taking: Reported on 01/01/2022)   No facility-administered encounter medications on file as of 01/01/2022.    Allergies (verified) Penicillins, Amlodipine, Lisinopril, Metformin and related, and Bactrim ds [sulfamethoxazole-trimethoprim]   History: Past Medical History:  Diagnosis Date   BCC (basal cell carcinoma of skin) Infundibulocystic 03/11/2008   Inner Left Eye   Chest pain at rest 11/07/2012   Diabetes mellitus  without complication (HCC)    Eosinophilic esophagitis 16/01/9603   Esophageal stricture    Exercise-induced asthma    GERD (gastroesophageal reflux disease)    Hx of colonic polyps 03/2020   Hypertension 09/27/2017   Hypothyroidism    Sarcoidosis    Shortness of breath 11/08/2012   RECENTLY  & WHEN i EXERCISE"   Solar lentigo 03/11/2008   Back - Atypical   Thyroid disease    Upper airway cough syndrome    Past Surgical History:  Procedure Laterality Date   ABDOMINAL  HYSTERECTOMY     Partial   ESOPHAGOGASTRODUODENOSCOPY (EGD) WITH ESOPHAGEAL DILATION N/A 11/10/2012   Procedure: ESOPHAGOGASTRODUODENOSCOPY (EGD) WITH ESOPHAGEAL DILATION;  Surgeon: Gatha Mayer, MD;  Location: [facility];  Service: Endoscopy;  Laterality: N/A;  [place] vs. [place]   VIDEO BRONCHOSCOPY WITH ENDOBRONCHIAL ULTRASOUND Bilateral 01/15/2015   Procedure: VIDEO BRONCHOSCOPY WITH ENDOBRONCHIAL ULTRASOUND of  LEFT LUNG LOWER LOBE and right lung;  Surgeon: Collene Gobble, MD;  Location: [facility];  Service: Thoracic;  Laterality: Bilateral;   Family History  Problem Relation Age of Onset   Diabetes Mother    Stroke Mother    Coronary artery disease Father    Coronary artery disease Brother    Hypothyroidism Maternal Grandmother    Diabetes Maternal Grandfather    Heart disease Paternal Grandmother    Heart disease Paternal Grandfather    Social History   Socioeconomic History   Marital status: Divorced    Spouse name: Not on file   Number of children: 3   Years of education: 14   Highest education level: Not on file  Occupational History   Occupation: Unemployed  Tobacco Use   Smoking status: Never   Smokeless tobacco: Never  Vaping Use   Vaping Use: Never used  Substance and Sexual Activity   Alcohol use: No   Drug use: No   Sexual activity: Not on file  Other Topics Concern   Not on file  Social History Narrative   Fun: Anything and everything   Social Determinants of Health   Financial Resource Strain: Low Risk  (01/01/2022)   Overall Financial Resource Strain (CARDIA)    Difficulty of Paying Living Expenses: Not hard at all  Food Insecurity: No Food Insecurity (01/01/2022)   Hunger Vital Sign    Worried About Running Out of Food in the Last Year: Never true    Ran Out of Food in the Last Year: Never true  Transportation Needs: No Transportation Needs (01/01/2022)   PRAPARE - Hydrologist (Medical): No    Lack of Transportation  (Non-Medical): No  Physical Activity: Sufficiently Active (01/01/2022)   Exercise Vital Sign    Days of Exercise per Week: 7 days    Minutes of Exercise per Session: 30 min  Stress: No Stress Concern Present (01/01/2022)   [place]    Feeling of Stress : Not at all  Social Connections: Moderately Isolated (01/01/2022)   Social Connection and Isolation Panel [NHANES]    Frequency of Communication with Friends and Family: More than three times a week    Frequency of Social Gatherings with Friends and Family: More than three times a week    Attends Religious Services: More than 4 times per year    Active Member of [employer] or Organizations: No    Attends Archivist Meetings: Never    Marital Status: Divorced    Tobacco  Counseling Counseling given: Not Answered   Clinical Intake:  Pre-visit preparation completed: Yes  Pain : No/denies pain     Nutritional Risks: None Diabetes: No  How often do you need to have someone help you when you read instructions, pamphlets, or other written materials from your doctor or pharmacy?: 1 - Never  Diabetic?no   Interpreter Needed?: No  Information entered by :: Jadene Pierini, LPN   Activities of Daily Living    01/01/2022    3:50 PM  In your present state of health, do you have any difficulty performing the following activities:  Hearing? 0  Vision? 0  Difficulty concentrating or making decisions? 0  Walking or climbing stairs? 0  Dressing or bathing? 0  Doing errands, shopping? 0  Preparing Food and eating ? N  Using the Toilet? N  In the past six months, have you accidently leaked urine? N  Do you have problems with loss of bowel control? N  Managing your Medications? N  Managing your Finances? N  Housekeeping or managing your Housekeeping? N    Patient Care Team: Hoyt Koch, MD as PCP - General (Internal Medicine) Debara Pickett Nadean Corwin, MD as  PCP - Cardiology (Cardiology) Rolley Sims, [place] [facility] as Consulting Physician (Optometry)  Indicate any recent Medical Services you may have received from other than [facility] providers in the past year (date may be approximate).     Assessment:   This is a routine wellness examination for [place].  Hearing/Vision screen Vision Screening - Comments:: Annual eye exams   Dietary issues and exercise activities discussed: Current Exercise Habits: The patient does not participate in regular exercise at present   Goals Addressed               This Visit's Progress     Patient Stated (pt-stated)   On track     My goal is to lose 50-60 pounds.       Depression Screen    01/01/2022    3:48 PM 09/25/2021   10:26 AM 12/29/2020   11:16 AM 10/01/2020   10:00 AM 08/04/2020    3:19 PM 05/19/2020    9:24 AM 04/20/2017    9:41 AM  PHQ 2/9 Scores  PHQ - 2 Score 0 0 0 0 0 0 0  PHQ- 9 Score  0         Fall Risk    01/01/2022    3:46 PM 09/25/2021   10:26 AM 12/29/2020   11:15 AM 10/01/2020    9:59 AM 08/04/2020    3:19 PM  [place] in the past year? 0 0 0 1 0  Number falls in past yr: 0 0 0 0 0  Injury with Fall? 0 0 0 0 0  Risk for fall due to : No Fall Risks  No Fall Risks No Fall Risks No Fall Risks  Follow up Falls prevention discussed  Falls evaluation completed Falls evaluation completed Falls evaluation completed    [place]:  Any stairs in or around the home? Yes  If so, are there any without handrails? No  Home free of loose throw rugs in walkways, pet beds, electrical cords, etc? Yes  Adequate lighting in your home to reduce risk of falls? Yes   ASSISTIVE DEVICES UTILIZED TO PREVENT FALLS:  Life alert? No  Use of a cane, walker or w/c? No  Grab bars in the bathroom? No  Shower chair or bench  in shower? No  Elevated toilet seat or a handicapped toilet? No      01/01/2022    3:50 PM  6CIT Screen  What Year? 0 points  What month? 0  points  What time? 0 points  Count back from 20 0 points  Months in reverse 0 points  Repeat phrase 0 points  Total Score 0 points    Immunizations Immunization History  Administered Date(s) Administered   Moderna SARS-COV2 Booster Vaccination 03/29/2020   PFIZER(Purple Top)SARS-COV-2 Vaccination 05/10/2019, 05/31/2019    TDAP status: Due, Education has been provided regarding the importance of this vaccine. Advised may receive this vaccine at local pharmacy or Health Dept. Aware to provide a copy of the vaccination record if obtained from local pharmacy or Health Dept. Verbalized acceptance and understanding.  Flu Vaccine status: Due, Education has been provided regarding the importance of this vaccine. Advised may receive this vaccine at local pharmacy or Health Dept. Aware to provide a copy of the vaccination record if obtained from local pharmacy or Health Dept. Verbalized acceptance and understanding.  Pneumococcal vaccine status: Due, Education has been provided regarding the importance of this vaccine. Advised may receive this vaccine at local pharmacy or Health Dept. Aware to provide a copy of the vaccination record if obtained from local pharmacy or Health Dept. Verbalized acceptance and understanding.  Covid-19 vaccine status: Completed vaccines  Qualifies for Shingles Vaccine? Yes   Zostavax completed No   Shingrix Completed?: No.    Education has been provided regarding the importance of this vaccine. Patient has been advised to call insurance company to determine out of pocket expense if they have not yet received this vaccine. Advised may also receive vaccine at local pharmacy or Health Dept. Verbalized acceptance and understanding.  Screening Tests Health Maintenance  Topic Date Due   OPHTHALMOLOGY EXAM  Never done   DEXA SCAN  Never done   FOOT EXAM  10/01/2021   COVID-19 Vaccine (3 - Pfizer risk series) 01/14/2022 (Originally 04/26/2020)   Zoster Vaccines- Shingrix (1  of 2) 03/30/2022 (Originally 03/12/1975)   INFLUENZA VACCINE  07/18/2022 (Originally 11/17/2021)   Pneumonia Vaccine 74+ Years old (1 - PCV) 01/01/2023 (Originally 03/13/2019)   TETANUS/TDAP  01/01/2023 (Originally 03/12/1975)   HEMOGLOBIN A1C  06/10/2022   Diabetic kidney evaluation - Urine ACR  07/07/2022   MAMMOGRAM  08/02/2022   Diabetic kidney evaluation - GFR measurement  12/30/2022   COLONOSCOPY (Pts 45-87yr Insurance coverage will need to be confirmed)  04/03/2027   Hepatitis C Screening  Completed   HPV VACCINES  Aged Out    Health Maintenance  Health Maintenance Due  Topic Date Due   OPHTHALMOLOGY EXAM  Never done   DEXA SCAN  Never done   FOOT EXAM  10/01/2021    Colorectal cancer screening: Type of screening: Colonoscopy. Completed 04/02/2020. Repeat every 7 years  Mammogram status: Ordered schedule 01/05/2022. Pt provided with contact info and advised to call to schedule appt.   Bone Density status: Ordered 01/01/2022. Pt provided with contact info and advised to call to schedule appt.  Lung Cancer Screening: (Low Dose CT Chest recommended if Age 68-80years, 30 pack-year currently smoking OR have quit w/in 15years.) does not qualify.   Lung Cancer Screening Referral: n/a  Additional Screening:  Hepatitis C Screening: does not qualify; Completed   Vision Screening: Recommended annual ophthalmology exams for early detection of glaucoma and other disorders of the eye. Is the patient up to date with their annual  eye exam?  Yes  Who is the provider or what is the name of the office in which the patient attends annual eye exams? Dr.Atkins If pt is not established with a provider, would they like to be referred to a provider to establish care? No .   Dental Screening: Recommended annual dental exams for proper oral hygiene  Community Resource Referral / Chronic Care Management: CRR required this visit?  No   CCM required this visit?  No      Plan:     I have  personally reviewed and noted the following in the patient's chart:   Medical and social history Use of alcohol, tobacco or illicit drugs  Current medications and supplements including opioid prescriptions. Patient is not currently taking opioid prescriptions. Functional ability and status Nutritional status Physical activity Advanced directives List of other physicians Hospitalizations, surgeries, and ER visits in previous 12 months Vitals Screenings to include cognitive, depression, and falls Referrals and appointments  In addition, I have reviewed and discussed with patient certain preventive protocols, quality metrics, and best practice recommendations. A written personalized care plan for preventive services as well as general preventive health recommendations were provided to patient.     Daphane Shepherd, LPN   01/01/7828   Nurse Notes: due eye exam , flu/TDAP vaccine

## 2022-01-04 DIAGNOSIS — R110 Nausea: Secondary | ICD-10-CM | POA: Insufficient documentation

## 2022-01-04 NOTE — Assessment & Plan Note (Signed)
Zofran ODT prescribed.

## 2022-01-04 NOTE — Assessment & Plan Note (Signed)
TSH checked per patient request.

## 2022-01-04 NOTE — Assessment & Plan Note (Signed)
Awaiting stool study results, she turned this in 2 days ago. Labs unremarkable 2 days ago.  Afebrile. She may continue taking Imodium since this is helping.  Referral to GI and f/u pending results of GI profile.

## 2022-01-05 ENCOUNTER — Ambulatory Visit: Payer: Medicare Other

## 2022-01-06 ENCOUNTER — Telehealth: Payer: Self-pay

## 2022-01-06 ENCOUNTER — Encounter: Payer: Self-pay | Admitting: Family Medicine

## 2022-01-06 ENCOUNTER — Ambulatory Visit (INDEPENDENT_AMBULATORY_CARE_PROVIDER_SITE_OTHER): Payer: Medicare Other | Admitting: Internal Medicine

## 2022-01-06 DIAGNOSIS — R197 Diarrhea, unspecified: Secondary | ICD-10-CM | POA: Diagnosis not present

## 2022-01-06 DIAGNOSIS — E039 Hypothyroidism, unspecified: Secondary | ICD-10-CM

## 2022-01-06 MED ORDER — METRONIDAZOLE 500 MG PO TABS: 500.0000 mg | ORAL_TABLET | Freq: Two times a day (BID) | ORAL | 0 refills | Status: AC

## 2022-01-06 NOTE — Telephone Encounter (Signed)
Please advise 

## 2022-01-06 NOTE — Patient Instructions (Signed)
We have sent in metronidazole to take 1 pill twice a day for 1 week.  Start taking a probiotic over the counter daily for the next 1-2 months.

## 2022-01-06 NOTE — Telephone Encounter (Signed)
Patient called in stating that she was aware of her thyroid levels were low and wanted to know what Dr.Crawford wanted to do about it. States she ran out last week and didn't take 2 and wonders if that could have caused it to go low.

## 2022-01-06 NOTE — Progress Notes (Signed)
   Subjective:   Patient ID: Katrina [name], female    DOB: 1955/10/14, 66 y.o.   MRN: [id number]  HPI The patient is here for diarrhea  PMH, [facility], social history reviewed and updated  Review of Systems  Constitutional: Negative.   HENT: Negative.    Eyes: Negative.   Respiratory:  Negative for cough, chest tightness and shortness of breath.   Cardiovascular:  Negative for chest pain, palpitations and leg swelling.  Gastrointestinal:  Positive for abdominal distention, abdominal pain and diarrhea. Negative for constipation, nausea and vomiting.  Musculoskeletal: Negative.   Skin: Negative.   Neurological: Negative.   Psychiatric/Behavioral: Negative.      Objective:  Physical Exam Constitutional:      Appearance: She is well-developed.  HENT:     Head: Normocephalic and atraumatic.  Cardiovascular:     Rate and Rhythm: Normal rate and regular rhythm.  Pulmonary:     Effort: Pulmonary effort is normal. No respiratory distress.     Breath sounds: Normal breath sounds. No wheezing or rales.  Abdominal:     General: Bowel sounds are normal. There is no distension.     Palpations: Abdomen is soft.     Tenderness: There is abdominal tenderness. There is no rebound.  Musculoskeletal:     Cervical back: Normal range of motion.  Skin:    General: Skin is warm and dry.  Neurological:     Mental Status: She is alert and oriented to person, place, and time.     Coordination: Coordination normal.     Vitals:   01/06/22 0934  BP: 122/86  Pulse: 73  SpO2: 95%  Weight: 200 lb (90.7 kg)  Height: 5' 2.5" (1.588 m)    Assessment & Plan:

## 2022-01-07 NOTE — Addendum Note (Signed)
Addended by: Pricilla Holm A on: 01/07/2022 07:09 AM   Modules accepted: Orders

## 2022-01-07 NOTE — Telephone Encounter (Signed)
I did not order that test so was not aware. I have reviewed and would like to recheck. She can come for labs anytime to check.

## 2022-01-08 ENCOUNTER — Encounter: Payer: Self-pay | Admitting: Internal Medicine

## 2022-01-08 NOTE — Assessment & Plan Note (Signed)
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 Stool testing results reviewed with patient and daughter and no illnesses detected. There was a likely viral illness at onset with all family contacts having same symptoms. She is still having diarrhea and pain with going. Advised to start probiotic and will try empric flagyl 500 mg BID for 1 week in cas of small bowel overgrowth. If no improvement will refer to GI. It seems less likely IBD as she is not having blood in stool, symptoms throughout the night. She has lost 10 pounds due to this illness.

## 2022-01-08 NOTE — Telephone Encounter (Signed)
Notified pt lab results and will come Monday lab for thyroid re-check.

## 2022-01-11 ENCOUNTER — Other Ambulatory Visit (INDEPENDENT_AMBULATORY_CARE_PROVIDER_SITE_OTHER): Payer: Medicare Other

## 2022-01-11 DIAGNOSIS — E039 Hypothyroidism, unspecified: Secondary | ICD-10-CM

## 2022-01-11 LAB — TSH: TSH: 6.58 u[IU]/mL — ABNORMAL HIGH (ref 0.35–5.50)

## 2022-01-11 LAB — T4, FREE: Free T4: 1.13 ng/dL (ref 0.60–1.60)

## 2022-01-11 IMAGING — CR DG CHEST 2V
2 series · 2 of 2 positions shown · non-contrast
Comparison: 12/06/2018

CLINICAL DATA: Chest pain

EXAM:
CHEST - 2 VIEW

[chest pa]
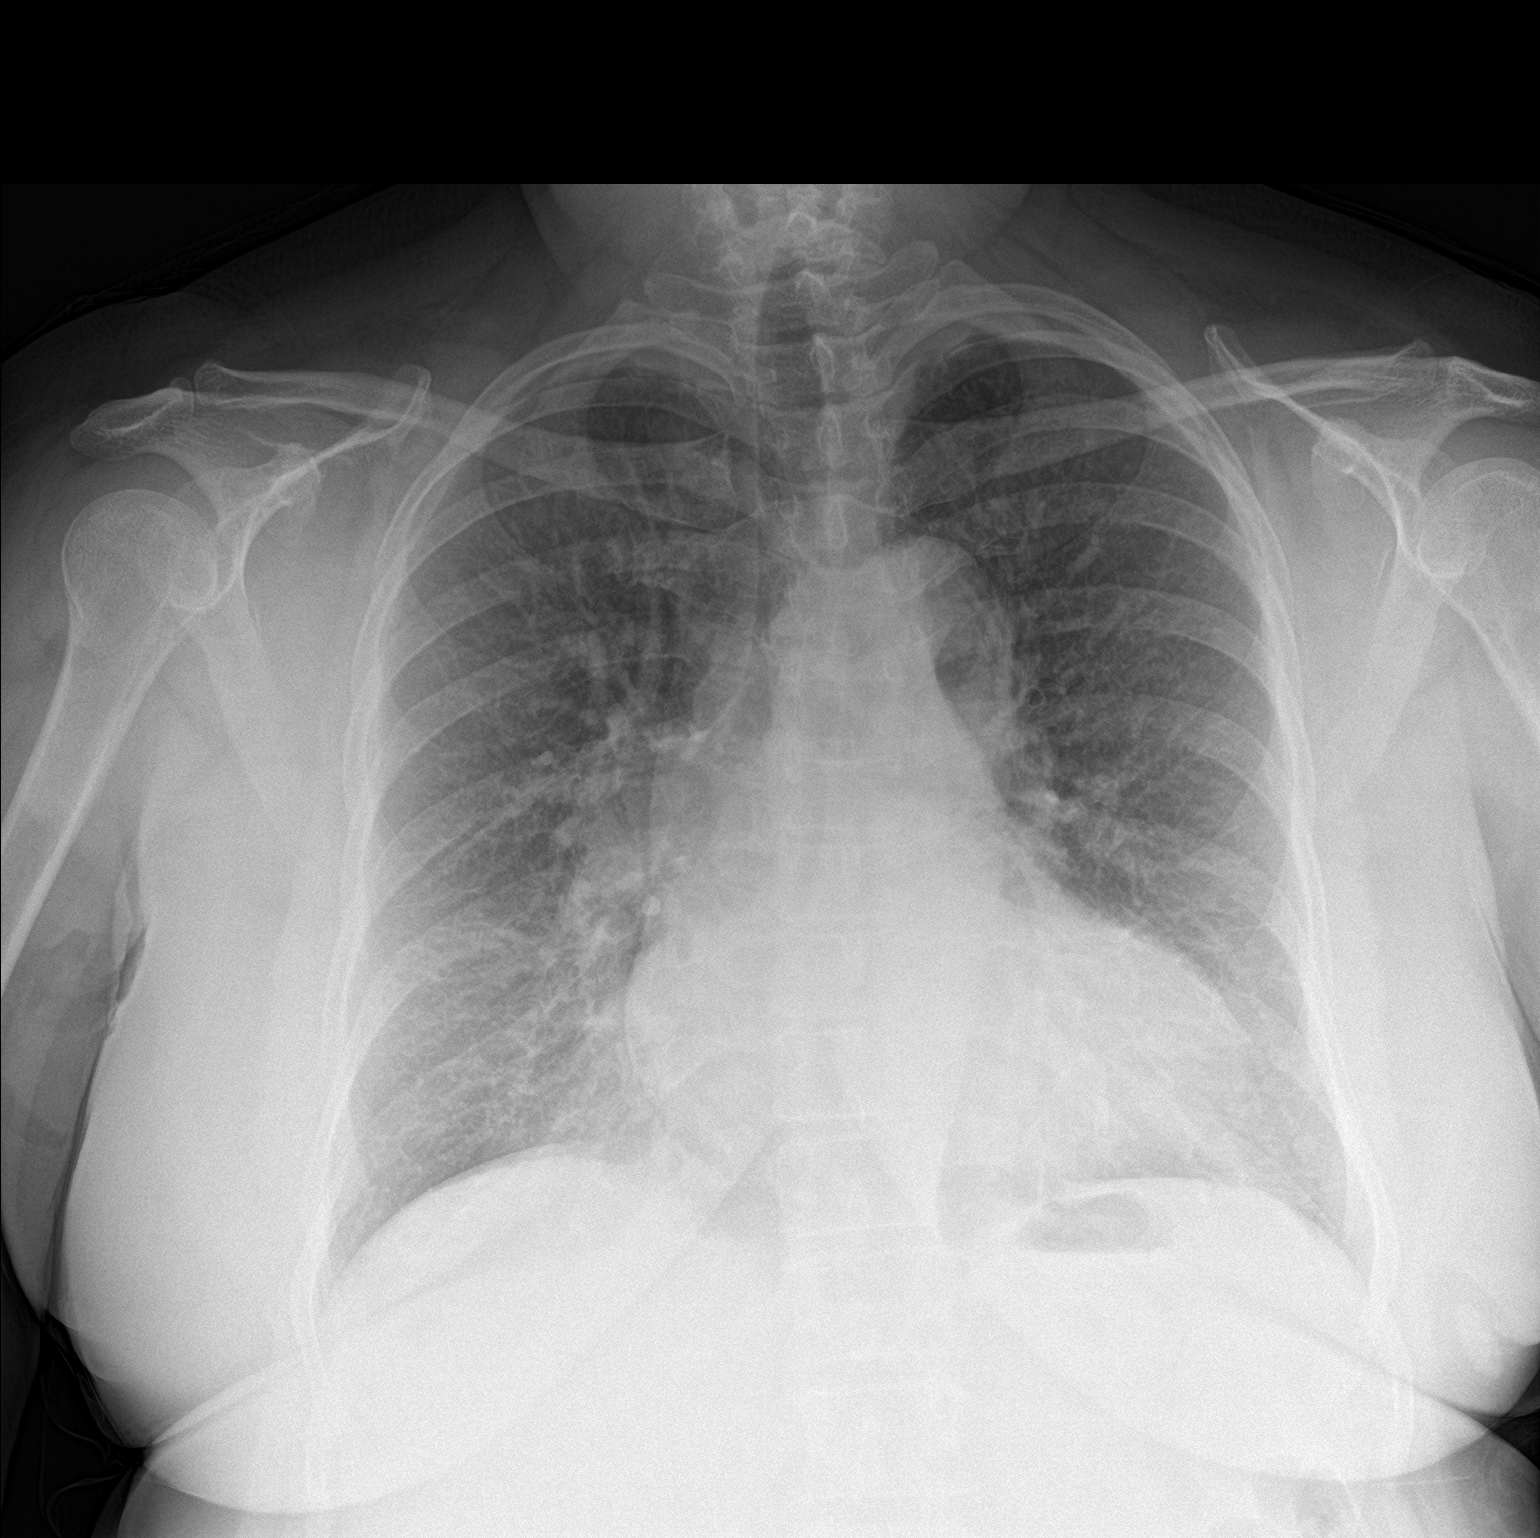

[chest lat]
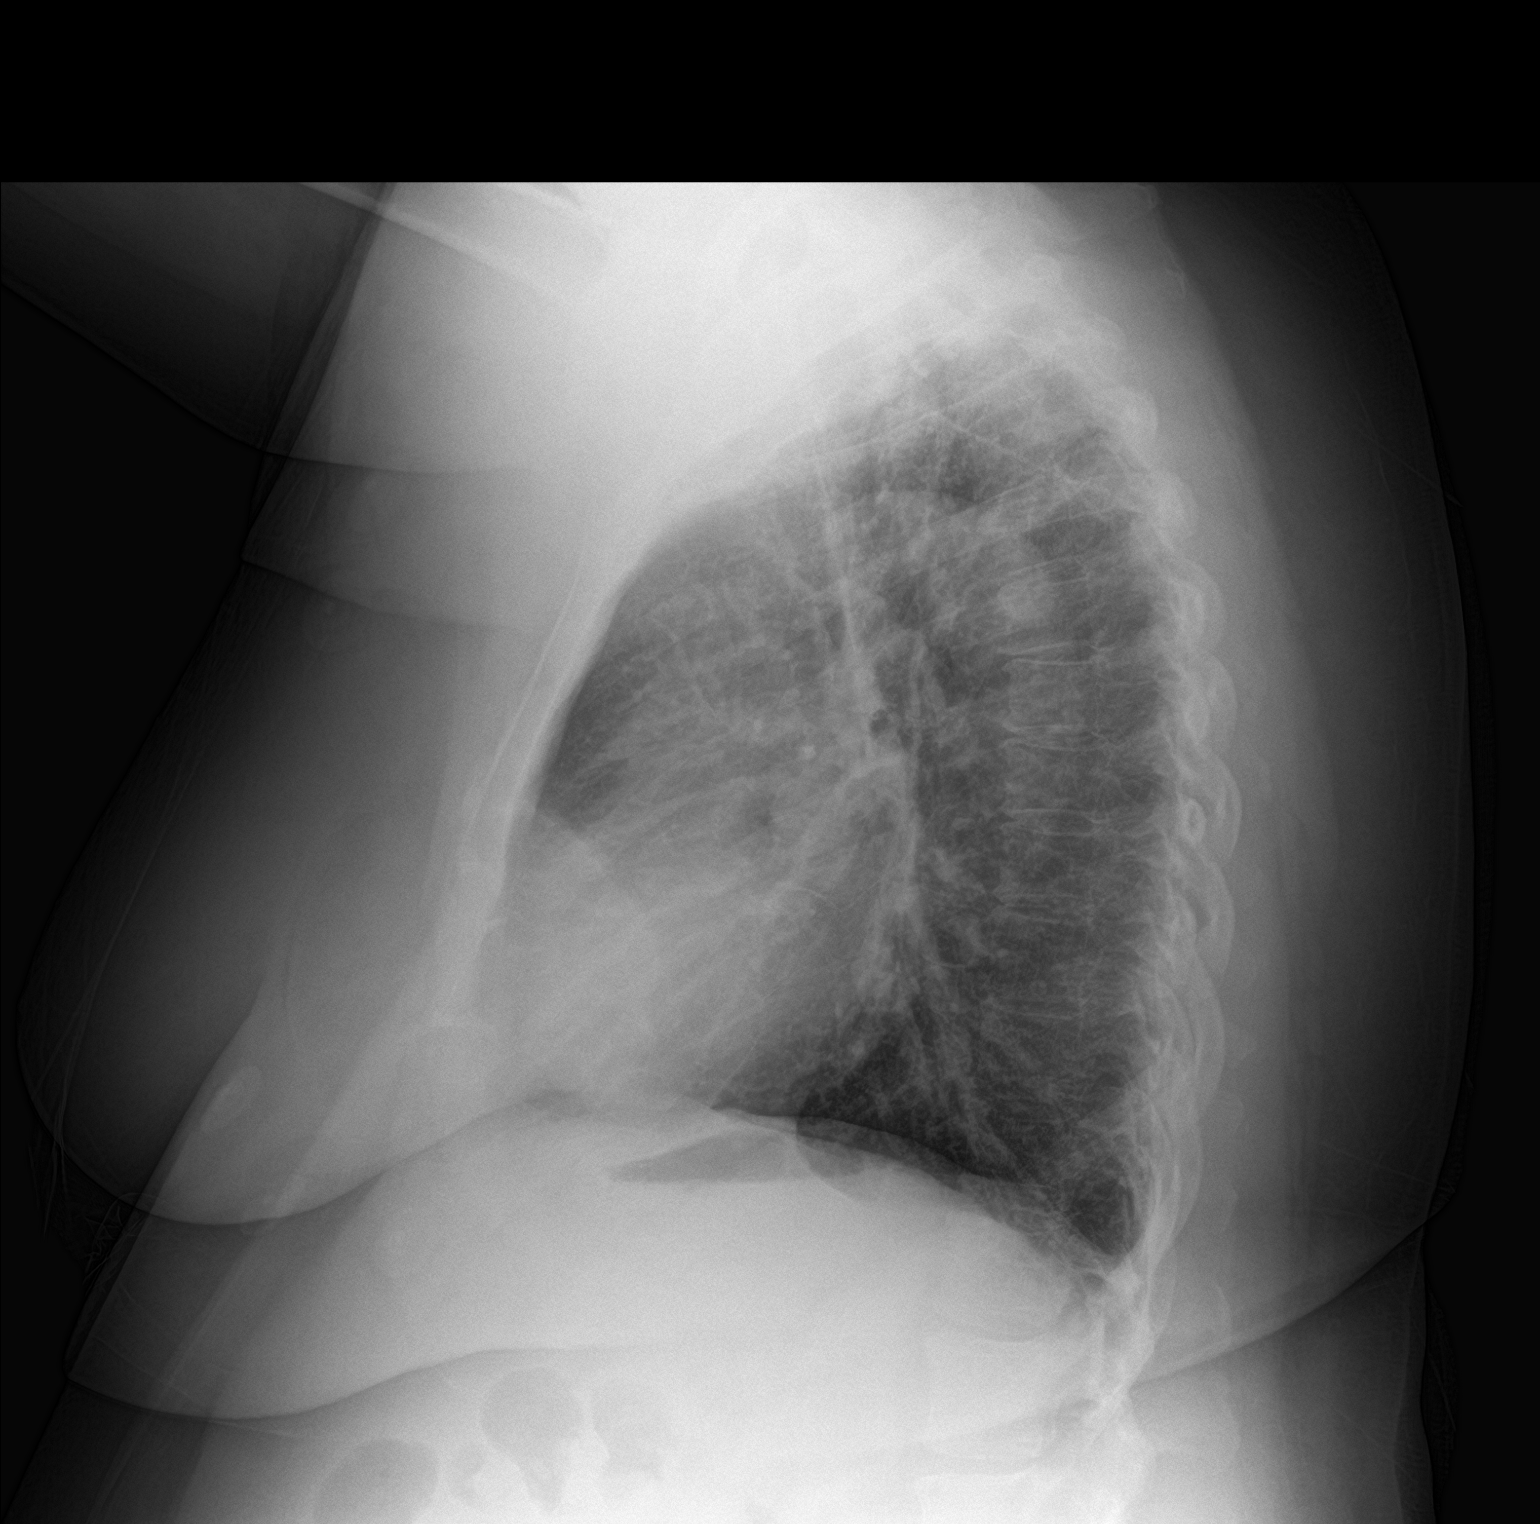

[2 of 2 positions shown; findings below may reference images not displayed]

FINDINGS: Frontal and lateral views of the chest demonstrate a stable cardiac
silhouette. Continued ectasia of the thoracic aorta. Lungs are
hyperinflated with background interstitial prominence consistent
with emphysema. No airspace disease, effusion, or pneumothorax. No
acute bony abnormalities.
IMPRESSION: 1. Stable emphysema, no superimposed process.

## 2022-01-12 ENCOUNTER — Encounter: Payer: Self-pay | Admitting: Nurse Practitioner

## 2022-01-12 ENCOUNTER — Other Ambulatory Visit: Payer: Self-pay | Admitting: Internal Medicine

## 2022-01-12 DIAGNOSIS — Z780 Asymptomatic menopausal state: Secondary | ICD-10-CM

## 2022-01-12 DIAGNOSIS — E2839 Other primary ovarian failure: Secondary | ICD-10-CM

## 2022-01-13 ENCOUNTER — Telehealth: Payer: Self-pay | Admitting: Internal Medicine

## 2022-01-13 NOTE — Telephone Encounter (Signed)
Patient states that the antibiotic she was prescribed last week helped but she has taken all of it and is still not completely better. Wondering if something else can be sent in to [facility], [place] [facility] [address]  Call back number is [phone]

## 2022-01-14 NOTE — Telephone Encounter (Signed)
Spoke to pt--stated still having loose stool but not watery, but not completely better. Pt questioning other aternative medication can be send. Please advise

## 2022-01-15 ENCOUNTER — Other Ambulatory Visit: Payer: Self-pay | Admitting: Internal Medicine

## 2022-01-15 DIAGNOSIS — K580 Irritable bowel syndrome with diarrhea: Secondary | ICD-10-CM

## 2022-01-15 MED ORDER — RIFAXIMIN 550 MG PO TABS: 550.0000 mg | ORAL_TABLET | Freq: Three times a day (TID) | ORAL | 0 refills | Status: AC

## 2022-01-18 ENCOUNTER — Telehealth: Payer: Self-pay | Admitting: Internal Medicine

## 2022-01-18 NOTE — Telephone Encounter (Signed)
Patient states that the stomach medicine you called in for her is $2600.  Please send in a replacement.

## 2022-01-18 NOTE — Telephone Encounter (Signed)
Please advise 

## 2022-01-19 NOTE — Telephone Encounter (Signed)
Is she talking about the flagyl I prescribed her? This should not be expensive.

## 2022-01-19 NOTE — Telephone Encounter (Signed)
Pt is stating that the medication that Dr. Ronnald Ramp called in rifaximin (XIFAXAN) 550 MG TABS tablet was 2600$.  Pt states that she took the full Rx of Flagyl. She states that it helped but is still experiencing loose stool. Pt states that overall she feels better but wants something to help resolve the loose stool.   Please advise

## 2022-01-19 NOTE — Telephone Encounter (Signed)
Has she tried probiotic since last visit? I would recommend to try this for at least a month. There are not other good prescription options available.

## 2022-01-19 NOTE — Telephone Encounter (Signed)
I advised the pt of Dr.Crawfords recommendation of trying the probiotic for at least a month as there were no othergood prescription options available.    Pt states she has tried the liquid form of probiotic and states that she has seen some improvement and now will try the CAPS form to see if that will help quicker.   FYI

## 2022-01-19 NOTE — Telephone Encounter (Signed)
Unable to LVM--not set up.

## 2022-01-20 NOTE — Telephone Encounter (Signed)
FYI

## 2022-02-01 ENCOUNTER — Ambulatory Visit: Payer: Medicare Other | Admitting: Dermatology

## 2022-02-02 ENCOUNTER — Ambulatory Visit: Payer: Medicare Other

## 2022-02-02 ENCOUNTER — Ambulatory Visit
Admission: RE | Admit: 2022-02-02 | Discharge: 2022-02-02 | Disposition: A | Discharge status: Home / Self Care | Payer: Medicare Other | Source: Ambulatory Visit | Attending: Internal Medicine | Admitting: Internal Medicine

## 2022-02-02 DIAGNOSIS — Z1231 Encounter for screening mammogram for malignant neoplasm of breast: Secondary | ICD-10-CM | POA: Diagnosis not present

## 2022-02-04 ENCOUNTER — Other Ambulatory Visit: Payer: Medicare Other

## 2022-02-04 ENCOUNTER — Encounter: Payer: Self-pay | Admitting: Nurse Practitioner

## 2022-02-04 ENCOUNTER — Other Ambulatory Visit: Payer: Self-pay | Admitting: Internal Medicine

## 2022-02-04 ENCOUNTER — Ambulatory Visit (INDEPENDENT_AMBULATORY_CARE_PROVIDER_SITE_OTHER): Payer: Medicare Other | Admitting: Nurse Practitioner

## 2022-02-04 VITALS — BP 170/98 | HR 73 | Temp 97.7°F | Ht 62.5 in | Wt 204.2 lb

## 2022-02-04 DIAGNOSIS — R928 Other abnormal and inconclusive findings on diagnostic imaging of breast: Secondary | ICD-10-CM

## 2022-02-04 DIAGNOSIS — J069 Acute upper respiratory infection, unspecified: Secondary | ICD-10-CM | POA: Diagnosis not present

## 2022-02-04 LAB — POCT RAPID STREP A (OFFICE): Rapid Strep A Screen: NEGATIVE

## 2022-02-04 LAB — POC COVID19 BINAXNOW: SARS Coronavirus 2 Ag: NEGATIVE

## 2022-02-04 LAB — POCT INFLUENZA A/B
Influenza A, POC: NEGATIVE
Influenza B, POC: NEGATIVE

## 2022-02-04 MED ORDER — HYDROCODONE BIT-HOMATROP MBR 5-1.5 MG/5ML PO SOLN: 5.0000 mL | Freq: Three times a day (TID) | ORAL | 0 refills | Status: DC | PRN

## 2022-02-04 NOTE — Patient Instructions (Addendum)
Test are negative Go to lab for CXR Use coricidin HBP and hycodan to manage symptoms at this time. Maintain adequate oral hydration Avoid oral decongestants due to elevated BP Resume use of losartan for hypertension

## 2022-02-04 NOTE — Progress Notes (Signed)
Established Patient Visit  Patient: Katrina [name]   DOB: 09/23/1955   66 y.o. Female  MRN: [id number] Visit Date: 02/04/2022  Subjective:    Chief Complaint  Patient presents with   Acute Visit    C/o of feeling sick after caring for her Yolanda Bonine , says his class has RSV Says she started having a sore throat & was coughing up mucus last night  Body aches x 1 week    URI  This is a new problem. The current episode started in the past 7 days. The problem has been unchanged. There has been no fever. Associated symptoms include congestion, coughing, ear pain, headaches, joint pain, rhinorrhea, sinus pain, sneezing and a sore throat. Pertinent negatives include no abdominal pain, chest pain, diarrhea, dysuria, joint swelling, nausea, neck pain, plugged ear sensation, rash, swollen glands, vomiting or wheezing. She has tried decongestant and acetaminophen for the symptoms. The treatment provided mild relief.  Grandson with possible RSV infection She is also concerned about possible pneumonia due to upper back pain with deep breaths and coughing. No glucose check at home. No BP check at home, reports she discontinued losartan.  Reviewed medical, surgical, and social history today  Medications: Outpatient Medications Prior to Visit  Medication Sig   albuterol (VENTOLIN HFA) 108 (90 Base) MCG/ACT inhaler INHALE 1 TO 2 PUFFS INTO THE LUNGS EVERY 6 HOURS AS NEEDED FOR WHEEZING OR SHORTNESS OF BREATH   aspirin-acetaminophen-caffeine (EXCEDRIN MIGRAINE) 250-250-65 MG tablet Take 1 tablet by mouth every 6 (six) hours as needed for headache.   ibuprofen (ADVIL) 200 MG tablet Take 200 mg by mouth every 6 (six) hours as needed for headache or mild pain.   levothyroxine (SYNTHROID) 175 MCG tablet TAKE 1 TABLET(175 MCG) BY MOUTH DAILY   losartan (COZAAR) 50 MG tablet TAKE 1 TABLET(50 MG) BY MOUTH DAILY (Patient not taking: Reported on 02/04/2022)   mupirocin ointment (BACTROBAN) 2 % Apply  1 application topically daily. (Patient not taking: Reported on 02/04/2022)   ondansetron (ZOFRAN) 4 MG tablet Take 1 tablet (4 mg total) by mouth every 6 (six) hours. (Patient not taking: Reported on 02/04/2022)   ondansetron (ZOFRAN-ODT) 4 MG disintegrating tablet Take 1 tablet (4 mg total) by mouth every 8 (eight) hours as needed for nausea or vomiting. (Patient not taking: Reported on 02/04/2022)   pantoprazole (PROTONIX) 40 MG tablet Take 1 tablet (40 mg total) by mouth daily. (Patient not taking: Reported on 02/04/2022)   sucralfate (CARAFATE) 1 g tablet Take 1 tablet (1 g total) by mouth 4 (four) times daily -  with meals and at bedtime. (Patient not taking: Reported on 02/04/2022)   No facility-administered medications prior to visit.   Reviewed past medical and social history.   ROS per HPI above      Objective:  BP (!) 170/98 (BP Location: Right Arm, Patient Position: Sitting, Cuff Size: Normal)   Pulse 73   Temp 97.7 F (36.5 C) (Temporal)   Ht 5' 2.5" (1.588 m)   Wt 204 lb 3.2 oz (92.6 kg)   SpO2 94%   BMI 36.75 kg/m      Physical Exam HENT:     Right Ear: Tympanic membrane, ear canal and external ear normal.     Left Ear: Tympanic membrane, ear canal and external ear normal.     Nose: Mucosal edema, congestion and rhinorrhea present. Rhinorrhea is clear.  Right Turbinates: Swollen.     Left Turbinates: Swollen.     Right Sinus: Maxillary sinus tenderness and frontal sinus tenderness present.     Left Sinus: Maxillary sinus tenderness and frontal sinus tenderness present.     Mouth/Throat:     Mouth: Mucous membranes are moist.     Pharynx: Posterior oropharyngeal erythema present. No oropharyngeal exudate or uvula swelling.     Tonsils: No tonsillar exudate.  Cardiovascular:     Rate and Rhythm: Normal rate and regular rhythm.     Pulses: Normal pulses.     Heart sounds: Normal heart sounds.  Pulmonary:     Effort: Pulmonary effort is normal.     Breath  sounds: Normal breath sounds.  Musculoskeletal:     Cervical back: Normal range of motion and neck supple.  Lymphadenopathy:     Cervical: No cervical adenopathy.  Neurological:     Mental Status: She is alert and oriented to person, place, and time.     No results found for any visits on 02/04/22.    Assessment & Plan:    Problem List Items Addressed This Visit   None Visit Diagnoses     Viral URI with cough    -  Primary   Relevant Medications   HYDROcodone bit-homatropine (HYCODAN) 5-1.5 MG/5ML syrup   Other Relevant Orders   POC COVID-19   POCT rapid strep A   POCT Influenza A/B   DG Chest 2 View     Test are negative Go to lab for CXR Use coricidin HBP and hycodan to manage symptoms at this time. Maintain adequate oral hydration Avoid oral decongestants due to elevated BP Resume use of losartan for hypertension  Return if symptoms worsen or fail to improve.     Wilfred Lacy, NP

## 2022-02-08 ENCOUNTER — Ambulatory Visit (INDEPENDENT_AMBULATORY_CARE_PROVIDER_SITE_OTHER): Payer: Medicare Other | Admitting: Internal Medicine

## 2022-02-08 ENCOUNTER — Other Ambulatory Visit: Payer: Self-pay | Admitting: Internal Medicine

## 2022-02-08 VITALS — BP 140/86 | HR 78 | Temp 99.0°F | Ht 62.0 in | Wt 201.0 lb

## 2022-02-08 DIAGNOSIS — R062 Wheezing: Secondary | ICD-10-CM | POA: Insufficient documentation

## 2022-02-08 DIAGNOSIS — I10 Essential (primary) hypertension: Secondary | ICD-10-CM | POA: Diagnosis not present

## 2022-02-08 DIAGNOSIS — R0989 Other specified symptoms and signs involving the circulatory and respiratory systems: Secondary | ICD-10-CM

## 2022-02-08 DIAGNOSIS — R059 Cough, unspecified: Secondary | ICD-10-CM | POA: Insufficient documentation

## 2022-02-08 DIAGNOSIS — J069 Acute upper respiratory infection, unspecified: Secondary | ICD-10-CM

## 2022-02-08 DIAGNOSIS — R051 Acute cough: Secondary | ICD-10-CM | POA: Diagnosis not present

## 2022-02-08 LAB — POC INFLUENZA A&B (BINAX/QUICKVUE)
Influenza A, POC: NEGATIVE
Influenza B, POC: NEGATIVE

## 2022-02-08 LAB — POC SOFIA SARS ANTIGEN FIA: SARS Coronavirus 2 Ag: NEGATIVE

## 2022-02-08 MED ORDER — HYDROCODONE BIT-HOMATROP MBR 5-1.5 MG/5ML PO SOLN: 5.0000 mL | Freq: Three times a day (TID) | ORAL | 0 refills | Status: DC | PRN

## 2022-02-08 MED ORDER — PREDNISONE 10 MG PO TABS: ORAL_TABLET | ORAL | 0 refills | Status: DC

## 2022-02-08 MED ORDER — AZITHROMYCIN 250 MG PO TABS: ORAL_TABLET | ORAL | 1 refills | Status: AC

## 2022-02-08 MED ORDER — ALBUTEROL SULFATE HFA 108 (90 BASE) MCG/ACT IN AERS
INHALATION_SPRAY | RESPIRATORY_TRACT | 2 refills | Status: DC
Start: 2022-02-08 — End: 2023-12-27

## 2022-02-08 NOTE — Assessment & Plan Note (Signed)
Mild to mod, c/w bronchitis vs pna, for antibx course, cough med prn,  to f/u any worsening symptoms or concerns 

## 2022-02-08 NOTE — Assessment & Plan Note (Signed)
Mild, likely infectious related, also has hx of asthma - for prednisone taper, continue albuterol inhaler prn,  to f/u any worsening symptoms or concerns

## 2022-02-08 NOTE — Assessment & Plan Note (Signed)
BP Readings from Last 3 Encounters:  02/08/22 (!) 140/86  02/04/22 (!) 170/98  01/06/22 122/86   Uncontrolled, likely reactive, pt to continue medical treatment losartan 50 mg qd

## 2022-02-08 NOTE — Patient Instructions (Signed)
Your Flu and Covid testing was done today  Please take all new medication as prescribed  - the antibiotic, cough medicine, and prednisone  Please continue all other medications as before, including the albuterol inhaler  Please have the pharmacy call with any other refills you may need.  Please continue your efforts at being more active, low cholesterol diet, and weight control.  Please keep your appointments with your specialists as you may have planned

## 2022-02-08 NOTE — Progress Notes (Signed)
Patient ID: Katrina [name], female   DOB: [birth year], 66 y.o.   MRN: [id number]        Chief Complaint: follow up cough and wheezing       HPI:  Katrina [name] is a 66 y.o. female Here with acute onset mild to mod 2-3 days ST, HA, general weakness and malaise, with prod cough greenish sputum, but Pt denies chest pain, increased sob or doe, wheezing, orthopnea, PND, increased LE swelling, palpitations, dizziness or syncope, except for mild wheezing, sob since yesterday.   Pt denies polydipsia, polyuria, or new focal neuro s/s.    Pt denies wt loss, night sweats, loss of appetite, or other constitutional symptoms       Wt Readings from Last 3 Encounters:  02/08/22 201 lb (91.2 kg)  02/04/22 204 lb 3.2 oz (92.6 kg)  01/06/22 200 lb (90.7 kg)   BP Readings from Last 3 Encounters:  02/08/22 (!) 140/86  02/04/22 (!) 170/98  01/06/22 122/86         Past Medical History:  Diagnosis Date   BCC (basal cell carcinoma of skin) Infundibulocystic 03/11/2008   Inner Left Eye   Chest pain at rest 11/07/2012   Diabetes mellitus without complication (HCC)    Eosinophilic esophagitis 24/58/0998   Esophageal stricture    Exercise-induced asthma    GERD (gastroesophageal reflux disease)    Hx of colonic polyps 03/2020   Hypertension 09/27/2017   Hypothyroidism    Sarcoidosis    Shortness of breath 11/08/2012   RECENTLY  & WHEN i EXERCISE"   Solar lentigo 03/11/2008   Back - Atypical   Thyroid disease    Upper airway cough syndrome    Past Surgical History:  Procedure Laterality Date   ABDOMINAL HYSTERECTOMY     Partial   ESOPHAGOGASTRODUODENOSCOPY (EGD) WITH ESOPHAGEAL DILATION N/A 11/10/2012   Procedure: ESOPHAGOGASTRODUODENOSCOPY (EGD) WITH ESOPHAGEAL DILATION;  Surgeon: Gatha Mayer, MD;  Location: [place];  Service: Endoscopy;  Laterality: N/A;  [place] vs. Balloon   VIDEO BRONCHOSCOPY WITH ENDOBRONCHIAL ULTRASOUND Bilateral 01/15/2015   Procedure: VIDEO BRONCHOSCOPY WITH ENDOBRONCHIAL  ULTRASOUND of  LEFT LUNG LOWER LOBE and right lung;  Surgeon: Collene Gobble, MD;  Location: [place];  Service: Thoracic;  Laterality: Bilateral;    reports that she has never smoked. She has never used smokeless tobacco. She reports that she does not drink alcohol and does not use drugs. family history includes Coronary artery disease in her brother and father; Diabetes in her maternal grandfather and mother; Heart disease in her paternal grandfather and paternal grandmother; Hypothyroidism in her maternal grandmother; Stroke in her mother. Allergies  Allergen Reactions   Penicillins Shortness Of Breath and Rash    DID THE REACTION INVOLVE: Swelling of the face/tongue/throat, SOB, or low BP? N Sudden or severe rash/hives, skin peeling, or the inside of the mouth or nose? Yes Did it require medical treatment? No When did it last happen?   2010  If all above answers are "NO", may proceed with cephalosporin use.    Amlodipine     Hives Other reaction(s): Other (See Comments) Hives   Lisinopril     Weakness/ muscle aches   Metformin And Related     Elevated heart rate   Bactrim Ds [Sulfamethoxazole-Trimethoprim] Rash    Upper body and Face redness with heat.     Current Outpatient Medications on File Prior to Visit  Medication Sig Dispense Refill   aspirin-acetaminophen-caffeine (EXCEDRIN MIGRAINE) 250-250-65 MG tablet  Take 1 tablet by mouth every 6 (six) hours as needed for headache.     ibuprofen (ADVIL) 200 MG tablet Take 200 mg by mouth every 6 (six) hours as needed for headache or mild pain.     levothyroxine (SYNTHROID) 175 MCG tablet TAKE 1 TABLET(175 MCG) BY MOUTH DAILY 90 tablet 0   losartan (COZAAR) 50 MG tablet TAKE 1 TABLET(50 MG) BY MOUTH DAILY 90 tablet 0   mupirocin ointment (BACTROBAN) 2 % Apply 1 application topically daily. 22 g 0   ondansetron (ZOFRAN) 4 MG tablet Take 1 tablet (4 mg total) by mouth every 6 (six) hours. 12 tablet 0   ondansetron (ZOFRAN-ODT) 4 MG  disintegrating tablet Take 1 tablet (4 mg total) by mouth every 8 (eight) hours as needed for nausea or vomiting. 20 tablet 0   pantoprazole (PROTONIX) 40 MG tablet Take 1 tablet (40 mg total) by mouth daily. 30 tablet 3   sucralfate (CARAFATE) 1 g tablet Take 1 tablet (1 g total) by mouth 4 (four) times daily -  with meals and at bedtime. 60 tablet 0   No current facility-administered medications on file prior to visit.        ROS:  All others reviewed and negative.  Objective        PE:  BP (!) 140/86 (BP Location: Left Arm, Patient Position: Sitting, Cuff Size: Large)   Pulse 78   Temp 99 F (37.2 C)   Ht '5\' 2"'$  (1.575 m)   Wt 201 lb (91.2 kg)   SpO2 94%   BMI 36.76 kg/m                 Constitutional: Pt appears in NAD, mild ill               HENT: Head: NCAT.                Right Ear: External ear normal.                 Left Ear: External ear normal. Bilat tm's with mild erythema.  Max sinus areas mild tender.  Pharynx with mild erythema, no exudate               Eyes: . Pupils are equal, round, and reactive to light. Conjunctivae and EOM are normal               Nose: without d/c or deformity               Neck: Neck supple. Gross normal ROM               Cardiovascular: Normal rate and regular rhythm.                 Pulmonary/Chest: Effort normal and breath sounds decreased without rales but with mild wheezing.                Abd:  Soft, NT, ND, + BS, no organomegaly               Neurological: Pt is alert. At baseline orientation, motor grossly intact               Skin: Skin is warm. No rashes, no other new lesions, LE edema - none               Psychiatric: Pt behavior is normal without agitation   Micro: none  Cardiac tracings I have personally interpreted today:  none  Pertinent Radiological findings (summarize): none   Lab Results  Component Value Date   WBC 8.3 12/29/2021   HGB 14.7 12/29/2021   HCT 44.5 12/29/2021   PLT 262.0 12/29/2021   GLUCOSE 152 (H)  12/29/2021   CHOL 141 08/22/2018   TRIG 268.0 (H) 08/22/2018   HDL 27.00 (L) 08/22/2018   LDLDIRECT 83.0 08/22/2018   ALT 24 12/29/2021   AST 20 12/29/2021   NA 138 12/29/2021   K 4.3 12/29/2021   CL 103 12/29/2021   CREATININE 0.81 12/29/2021   BUN 17 12/29/2021   CO2 27 12/29/2021   TSH 6.58 (H) 01/11/2022   INR 1.10 01/13/2015   HGBA1C 8.5 (H) 12/08/2021   MICROALBUR 4.2 (H) 07/06/2021   Influenza A, POC Negative Negative  Negative   Influenza B, POC Negative Negative  Negative    Assessment/Plan:  Katrina [name] is a 66 y.o. White or Caucasian [1] female with  has a past medical history of BCC (basal cell carcinoma of skin) Infundibulocystic (03/11/2008), Chest pain at rest (11/07/2012), Diabetes mellitus without complication ([place]), Eosinophilic esophagitis (36/64/4034), Esophageal stricture, Exercise-induced asthma, GERD (gastroesophageal reflux disease), colonic polyps (03/2020), Hypertension (09/27/2017), Hypothyroidism, Sarcoidosis, Shortness of breath (11/08/2012), Solar lentigo (03/11/2008), Thyroid disease, and Upper airway cough syndrome.  Cough Mild to mod, c/w bronchitis vs pna, for antibx course, cough med prn,  to f/u any worsening symptoms or concerns   Wheezing Mild, likely infectious related, also has hx of asthma - for prednisone taper, continue albuterol inhaler prn,  to f/u any worsening symptoms or concerns   Hypertension BP Readings from Last 3 Encounters:  02/08/22 (!) 140/86  02/04/22 (!) 170/98  01/06/22 122/86   Uncontrolled, likely reactive, pt to continue medical treatment losartan 50 mg qd  Followup: Return if symptoms worsen or fail to improve.  Cathlean Cower, MD 02/08/2022 4:57 PM [facility]

## 2022-02-09 ENCOUNTER — Telehealth: Payer: Self-pay | Admitting: Internal Medicine

## 2022-02-09 NOTE — Telephone Encounter (Signed)
Pt stated that she has RSV and needs to reschedule her OV that is scheduled for tomorrow : Pt rescheduled for 11/14 2023 at 1:30 with Tye Savoy NP: Pt made aware Pt verbalized understanding with all questions answered.

## 2022-02-09 NOTE — Telephone Encounter (Signed)
Inbound call from patient stating that she has an appointment for tomorrow with Nevin Bloodgood at 10:00. Patient stated that she went to her PCP yesterday and is believed to have RSV. Patient is seeking advice if she can keep her appointment or if she needs to reschedule. Please advise.

## 2022-02-10 ENCOUNTER — Ambulatory Visit: Payer: Medicare Other | Admitting: Nurse Practitioner

## 2022-02-12 ENCOUNTER — Ambulatory Visit
Admission: RE | Admit: 2022-02-12 | Discharge: 2022-02-12 | Disposition: A | Discharge status: Home / Self Care | Payer: Medicare Other | Source: Ambulatory Visit | Attending: Internal Medicine | Admitting: Internal Medicine

## 2022-02-12 DIAGNOSIS — R921 Mammographic calcification found on diagnostic imaging of breast: Secondary | ICD-10-CM | POA: Diagnosis not present

## 2022-02-12 DIAGNOSIS — R928 Other abnormal and inconclusive findings on diagnostic imaging of breast: Secondary | ICD-10-CM

## 2022-03-02 ENCOUNTER — Ambulatory Visit (INDEPENDENT_AMBULATORY_CARE_PROVIDER_SITE_OTHER): Payer: Medicare Other | Admitting: Nurse Practitioner

## 2022-03-02 ENCOUNTER — Encounter: Payer: Self-pay | Admitting: Nurse Practitioner

## 2022-03-02 VITALS — BP 144/74 | HR 68 | Ht 62.5 in | Wt 206.4 lb

## 2022-03-02 DIAGNOSIS — R197 Diarrhea, unspecified: Secondary | ICD-10-CM | POA: Diagnosis not present

## 2022-03-02 DIAGNOSIS — R1012 Left upper quadrant pain: Secondary | ICD-10-CM | POA: Diagnosis not present

## 2022-03-02 NOTE — Progress Notes (Signed)
Chief Complaint:  resolving abdominal and diarrhea   Assessment &  Plan   # 66 yo female with recent prolonged episode of LUQ pain and severe diarrhea. Non-contrast CT scan and labs unrevealing. Suspect resolving resolving infectious process, especially given sudden onset and improvement with a course of flagyl  Okay to complete remainder of probiotics ( ~ two weeks worth) then discontinue Call us if things do not continue to improve    # History of colon polyps. Up to date on colonoscopy, next one due in 2028  # GERD, asymptomatic on daily PPI  HPI   Patient is a 66 year old female known to Dr. Carlean Purl    With a past medical history of eosinophilic esophagitis,  Schatzki's, C. difficile infection. Fatty liver. See PMH /PSH for additional history.    Charlsey was last seen in 2021 for evaluation of epigastric pain and colon cancer screening.  She subsequently underwent an EGD and colonoscopy, see reports below.  Interim history:  In August she had several days of severe, malodorous, non-bloody diarrhea, nausea and LUQ pain. She was having up to 15 loose BMs / day. She did swim in a community pool just prior to onset of symptoms. She does recall that the pool had been closed a few times over the summer for unclear reasons.  She hadn't traveled out of the country, been hiking or camping. No recent antibiotics.  She does recall a co-worker having diarrhea at work.   Patient saw PCP in Sept, stool studies were negative. Non-contrast CT scan was unrevealing.  WBC normal. Hgb was normal. Symptoms persisted , she was eating a bland diet. PCP gave her 7 days of flagyl and symptoms immediately improved but didn't resolve. Completed flagyl the end of September. Started probiotics the beginning of October. For the last several weeks she has been feeling much better. Stools not quite back to baseline but they are formed. Averaging about 2 Bms a day. Still has occasional LUQ pain . Slowly trying to  liberalize diet. Still struggling to reintroduce certain foods to diet but some foods do not taste the same ( some things taste salty when they aren't). She had lost weight at onset of symptoms but started to regain.   Prior to onset of above symptoms her bowel were normal. She was have a formed BM daily.   TSH 39.46 on 9/14 but on repeat on 9/25 was much better at 6.58 on 9/25. No adjustment of synthroid in the interim. Patient thinks some of her medications were interfering with the absorption of synthroid at the time.  Previous GI Evaluation  Dec 2021 EGD for epigastric pain -The esophagus was normal. - The stomach was normal. - The examined duodenum was normal. - The cardia and gastric fundus were normal on retroflexion.   Dec 2021 screening colonoscopy -- One diminutive polyp in the sigmoid colon, removed with a cold snare. Resected and retrieved. - Two 1 mm polyps in the descending colon and in the cecum, removed with a cold biopsy forceps. Resected and retrieved. - Internal hemorrhoids. - The examination was otherwise normal on direct and retroflexion views.  Path Diminutive ssp and diminutive adenoma + benign mucosal polyp Recall 7 years 2028   Labs:     Latest Ref Rng & Units 12/29/2021   10:49 AM 12/16/2021    7:04 PM 12/11/2021   11:22 AM  CBC  WBC 4.0 - 10.5 K/uL 8.3  8.0  5.4   Hemoglobin 12.0 - 15.0  g/dL 14.7  13.3  15.3   Hematocrit 36.0 - 46.0 % 44.5  38.7  46.0   Platelets 150.0 - 400.0 K/uL 262.0  273  252.0        Latest Ref Rng & Units 12/29/2021   10:49 AM 12/16/2021    7:04 PM 12/11/2021   11:22 AM  Hepatic Function  Total Protein 6.0 - 8.3 g/dL 7.5  7.0  7.8   Albumin 3.5 - 5.2 g/dL 4.0  3.5  4.3   AST 0 - 37 U/L 20  26  46   ALT 0 - 35 U/L 24  32  46   Alk Phosphatase 39 - 117 U/L 89  79  81   Total Bilirubin 0.2 - 1.2 mg/dL 0.6  0.7  0.6      Past Medical History:  Diagnosis Date   BCC (basal cell carcinoma of skin) Infundibulocystic 03/11/2008    Inner Left Eye   Chest pain at rest 11/07/2012   Diabetes mellitus without complication (HCC)    Eosinophilic esophagitis 38/01/1750   Esophageal stricture    Exercise-induced asthma    GERD (gastroesophageal reflux disease)    Hx of colonic polyps 03/2020   Hypertension 09/27/2017   Hypothyroidism    Sarcoidosis    Shortness of breath 11/08/2012   RECENTLY  & WHEN i EXERCISE"   Solar lentigo 03/11/2008   Back - Atypical   Thyroid disease    Upper airway cough syndrome     Past Surgical History:  Procedure Laterality Date   ABDOMINAL HYSTERECTOMY     Partial   ESOPHAGOGASTRODUODENOSCOPY (EGD) WITH ESOPHAGEAL DILATION N/A 11/10/2012   Procedure: ESOPHAGOGASTRODUODENOSCOPY (EGD) WITH ESOPHAGEAL DILATION;  Surgeon: Gatha Mayer, MD;  Location: [facility] END[facility];  Service: Endoscopy;  Laterality: N/A;  Maloney vs. Balloon   VIDEO BRONCHOSCOPY WITH ENDOBRONCHIAL ULTRASOUND Bilateral 01/15/2015   Procedure: VIDEO BRONCHOSCOPY WITH ENDOBRONCHIAL ULTRASOUND of  LEFT LUNG LOWER LOBE and right lung;  Surgeon: Collene Gobble, MD;  Location: [facility];  Service: Thoracic;  Laterality: Bilateral;    Current Medications, Allergies, Family History and Social History were reviewed in [facility] record.     Current Outpatient Medications  Medication Sig Dispense Refill   albuterol (VENTOLIN HFA) 108 (90 Base) MCG/ACT inhaler INHALE 1 TO 2 PUFFS INTO THE LUNGS EVERY 6 HOURS AS NEEDED FOR WHEEZING OR SHORTNESS OF BREATH 6.7 g 2   ibuprofen (ADVIL) 200 MG tablet Take 200 mg by mouth every 6 (six) hours as needed for headache or mild pain.     levothyroxine (SYNTHROID) 175 MCG tablet TAKE 1 TABLET(175 MCG) BY MOUTH DAILY 90 tablet 0   mupirocin ointment (BACTROBAN) 2 % Apply 1 application topically daily. (Patient taking differently: Apply 1 application  topically as needed.) 22 g 0   omeprazole (PRILOSEC) 20 MG capsule Take 20 mg by mouth daily.     No current facility-administered  medications for this visit.    Review of Systems: No chest pain. No shortness of breath. No urinary complaints.    Physical Exam  Wt Readings from Last 3 Encounters:  03/02/22 206 lb 6 oz (93.6 kg)  02/08/22 201 lb (91.2 kg)  02/04/22 204 lb 3.2 oz (92.6 kg)    BP (!) 144/74   Pulse 68   Ht 5' 2.5" (1.588 m)   Wt 206 lb 6 oz (93.6 kg)   BMI 37.14 kg/m  Constitutional:  Generally well appearing female in no acute distress. Psychiatric:  Pleasant. Normal mood and affect. Behavior is normal. EENT: Pupils normal.  Conjunctivae are normal. No scleral icterus. Neck supple.  Cardiovascular: Normal rate, regular rhythm. No edema Pulmonary/chest: Effort normal and breath sounds normal. No wheezing, rales or rhonchi. Abdominal: Soft, nondistended, nontender. Bowel sounds active throughout. There are no masses palpable. No hepatomegaly. Neurological: Alert and oriented to person place and time. Skin: Skin is warm and dry. No rashes noted.  Tye Savoy, NP  03/02/2022, 1:35 PM  Cc:  Hoyt Koch, *

## 2022-03-02 NOTE — Patient Instructions (Addendum)
   If you are age 68 or older, your body mass index should be between 23-30. Your Body mass index is 37.14 kg/m. If this is out of the aforementioned range listed, please consider follow up with your Primary Care Provider.  If you are age 28 or younger, your body mass index should be between 19-25. Your Body mass index is 37.14 kg/m. If this is out of the aformentioned range listed, please consider follow up with your Primary Care Provider.   The [facility] providers would like to encourage you to use [facility] to communicate with providers for non-urgent requests or questions.  Due to long hold times on the telephone, sending your provider a message by [facility] may be a faster and more efficient way to get a response.  Please allow 48 business hours for a response.  Please remember that this is for non-urgent requests.   Complete remainder of probiotics that you are taking and then can stop them.   Call us if symptoms do not continue to improve.   It was a pleasure to see you today!  Thank you for trusting me with your gastrointestinal care!

## 2022-03-16 DIAGNOSIS — R7309 Other abnormal glucose: Secondary | ICD-10-CM | POA: Diagnosis not present

## 2022-03-16 DIAGNOSIS — H2513 Age-related nuclear cataract, bilateral: Secondary | ICD-10-CM | POA: Diagnosis not present

## 2022-03-16 DIAGNOSIS — D3132 Benign neoplasm of left choroid: Secondary | ICD-10-CM | POA: Diagnosis not present

## 2022-03-16 DIAGNOSIS — H524 Presbyopia: Secondary | ICD-10-CM | POA: Diagnosis not present

## 2022-03-16 LAB — HM DIABETES EYE EXAM

## 2022-04-02 ENCOUNTER — Other Ambulatory Visit: Payer: Self-pay | Admitting: Internal Medicine

## 2022-04-23 ENCOUNTER — Encounter: Payer: Self-pay | Admitting: Internal Medicine

## 2022-04-23 ENCOUNTER — Ambulatory Visit (INDEPENDENT_AMBULATORY_CARE_PROVIDER_SITE_OTHER): Payer: PPO | Admitting: Internal Medicine

## 2022-04-23 VITALS — BP 160/98 | HR 61 | Temp 97.7°F | Ht 62.5 in | Wt 206.0 lb

## 2022-04-23 DIAGNOSIS — E118 Type 2 diabetes mellitus with unspecified complications: Secondary | ICD-10-CM | POA: Diagnosis not present

## 2022-04-23 DIAGNOSIS — D1723 Benign lipomatous neoplasm of skin and subcutaneous tissue of right leg: Secondary | ICD-10-CM

## 2022-04-23 DIAGNOSIS — D179 Benign lipomatous neoplasm, unspecified: Secondary | ICD-10-CM | POA: Insufficient documentation

## 2022-04-23 MED ORDER — SEMAGLUTIDE (1 MG/DOSE) 4 MG/3ML ~~LOC~~ SOPN: 1.0000 mg | PEN_INJECTOR | SUBCUTANEOUS | 0 refills | Status: DC

## 2022-04-23 MED ORDER — SEMAGLUTIDE (2 MG/DOSE) 8 MG/3ML ~~LOC~~ SOPN: 2.0000 mg | PEN_INJECTOR | SUBCUTANEOUS | 5 refills | Status: DC

## 2022-04-23 MED ORDER — OZEMPIC (0.25 OR 0.5 MG/DOSE) 2 MG/3ML ~~LOC~~ SOPN: PEN_INJECTOR | SUBCUTANEOUS | 0 refills | Status: DC

## 2022-04-23 NOTE — Assessment & Plan Note (Signed)
She is unable to lose weight despite efforts with weight loss. Rx ozempic for concurrent diabetes standard titration return 3-6 months.

## 2022-04-23 NOTE — Assessment & Plan Note (Signed)
Likely diagnosis. Explained about benign nature and possible resection needed if growth is painful or disfiguring.

## 2022-04-23 NOTE — Progress Notes (Signed)
   Subjective:   Patient ID: Katrina [name], female    DOB: Aug 13, 1955, 67 y.o.   MRN: [id number]  HPI The patient is a 67 YO female coming in for weight and place on leg.   Review of Systems  Constitutional:  Positive for unexpected weight change.  HENT: Negative.    Eyes: Negative.   Respiratory:  Negative for cough, chest tightness and shortness of breath.   Cardiovascular:  Negative for chest pain, palpitations and leg swelling.  Gastrointestinal:  Negative for abdominal distention, abdominal pain, constipation, diarrhea, nausea and vomiting.  Musculoskeletal: Negative.   Skin: Negative.        Lump right thigh  Neurological: Negative.   Psychiatric/Behavioral: Negative.      Objective:  Physical Exam Constitutional:      Appearance: She is well-developed. She is obese.  HENT:     Head: Normocephalic and atraumatic.  Cardiovascular:     Rate and Rhythm: Normal rate and regular rhythm.  Pulmonary:     Effort: Pulmonary effort is normal. No respiratory distress.     Breath sounds: Normal breath sounds. No wheezing or rales.  Abdominal:     General: Bowel sounds are normal. There is no distension.     Palpations: Abdomen is soft.     Tenderness: There is no abdominal tenderness. There is no rebound.  Musculoskeletal:     Cervical back: Normal range of motion.  Skin:    General: Skin is warm and dry.     Comments: Lipoma right thigh benign  Neurological:     Mental Status: She is alert and oriented to person, place, and time.     Coordination: Coordination normal.     Vitals:   04/23/22 1044 04/23/22 1052  BP: (!) 160/98 (!) 160/98  Pulse: 61   Temp: 97.7 F (36.5 C)   TempSrc: Oral   SpO2: 95%   Weight: 206 lb (93.4 kg)   Height: 5' 2.5" (1.588 m)     Assessment & Plan:

## 2022-04-23 NOTE — Patient Instructions (Signed)
Ozempic start with 0.25 mg weekly for month 1, then increase to 0.5 mg weekly for month 2, then 1 mg weekly for month 3, then 2 mg weekly for month 4 and ongoing.

## 2022-04-23 NOTE — Assessment & Plan Note (Signed)
Foot exam done today. Rx ozempic as she is willing to try and diabetes is uncontrolled (last HgA1c 8.4). Rx standard titration and she will follow up 3-6 months for HgA1c and weight check.

## 2022-04-28 ENCOUNTER — Ambulatory Visit (INDEPENDENT_AMBULATORY_CARE_PROVIDER_SITE_OTHER): Payer: PPO | Admitting: *Deleted

## 2022-04-28 ENCOUNTER — Ambulatory Visit: Payer: PPO

## 2022-04-28 DIAGNOSIS — E118 Type 2 diabetes mellitus with unspecified complications: Secondary | ICD-10-CM | POA: Diagnosis not present

## 2022-04-28 NOTE — Progress Notes (Signed)
Pt came in to be shown how to take her Ozempic. Showed pt how to administer. She states she understood. Pt brought her own ozempic Lot # [id number], ndc [phone] and date 10/17/23

## 2022-05-04 ENCOUNTER — Ambulatory Visit: Payer: PPO

## 2022-05-05 ENCOUNTER — Encounter: Payer: Self-pay | Admitting: Internal Medicine

## 2022-05-05 ENCOUNTER — Ambulatory Visit (INDEPENDENT_AMBULATORY_CARE_PROVIDER_SITE_OTHER): Payer: PPO | Admitting: Internal Medicine

## 2022-05-05 VITALS — BP 142/88 | HR 56 | Temp 98.2°F | Ht 62.5 in | Wt 206.0 lb

## 2022-05-05 DIAGNOSIS — R030 Elevated blood-pressure reading, without diagnosis of hypertension: Secondary | ICD-10-CM

## 2022-05-05 DIAGNOSIS — M79672 Pain in left foot: Secondary | ICD-10-CM | POA: Diagnosis not present

## 2022-05-05 DIAGNOSIS — R21 Rash and other nonspecific skin eruption: Secondary | ICD-10-CM

## 2022-05-05 DIAGNOSIS — M79671 Pain in right foot: Secondary | ICD-10-CM

## 2022-05-05 MED ORDER — DOXYCYCLINE HYCLATE 100 MG PO TABS: 100.0000 mg | ORAL_TABLET | Freq: Two times a day (BID) | ORAL | 0 refills | Status: AC

## 2022-05-05 MED ORDER — TRIAMCINOLONE ACETONIDE 0.1 % EX CREA: 1.0000 | TOPICAL_CREAM | Freq: Two times a day (BID) | CUTANEOUS | 0 refills | Status: DC

## 2022-05-05 NOTE — Progress Notes (Signed)
Subjective:    Patient ID: Katrina [name], female    DOB: 20-Jun-1955, 67 y.o.   MRN: [id number]      HPI Aanchal is here for  Chief Complaint  Patient presents with   Rash    Rash right lower leg (very itchy) Started after injection of Ozempic     Rash - it started in the right anterior lower leg and is spreading.   It started last Thursday or Friday- a day or 2 after first ozempic injection.  She denies any side effects from the [place].  No local rash and no concerning stomach symptoms.  The rash is very itchy and has spread on the right lower leg, but is not elsewhere.  She tried Lotrimin on the rash without much improvement.  She tried Cortaid and she thinks that may have helped a little bit.  No fevers.   Bilateral feet pain-for short time has had some bilateral stabbing knifelike feet pain.  It is the whole foot-bottoms of the feet, toes.  It seemed to have gotten worse after the Ozempic injection she thinks.  She has been taking a lot of Advil because of the pain and wrapping feet up and heating pad.  The pain can be severe and was so severe the other night she was crying.  Typically it does not bother her that much during the day when she is active and walking around-it bothers her in the evening and at night.  She notes that she does have a little pain in her hands.  She did go for her pedicure last week.     Medications and allergies reviewed with patient and updated if appropriate.  Current Outpatient Medications on File Prior to Visit  Medication Sig Dispense Refill   albuterol (VENTOLIN HFA) 108 (90 Base) MCG/ACT inhaler INHALE 1 TO 2 PUFFS INTO THE LUNGS EVERY 6 HOURS AS NEEDED FOR WHEEZING OR SHORTNESS OF BREATH 6.7 g 2   ibuprofen (ADVIL) 200 MG tablet Take 200 mg by mouth every 6 (six) hours as needed for headache or mild pain.     levothyroxine (SYNTHROID) 175 MCG tablet TAKE 1 TABLET(175 MCG) BY MOUTH DAILY 90 tablet 0   mupirocin ointment (BACTROBAN) 2 % Apply 1  application topically daily. (Patient taking differently: Apply 1 application  topically as needed.) 22 g 0   omeprazole (PRILOSEC) 20 MG capsule Take 20 mg by mouth daily.     Semaglutide, 1 MG/DOSE, 4 MG/3ML SOPN Inject 1 mg as directed once a week. 3 mL 0   Semaglutide, 2 MG/DOSE, 8 MG/3ML SOPN Inject 2 mg as directed once a week. 3 mL 5   Semaglutide,0.25 or 0.'5MG'$ /DOS, (OZEMPIC, 0.25 OR 0.5 MG/DOSE,) 2 MG/3ML SOPN Inject 0.25 mg into the skin once a week for 28 days, THEN 0.5 mg once a week for 28 days. 6 mL 0   No current facility-administered medications on file prior to visit.    Review of Systems     Objective:   Vitals:   05/05/22 1020  BP: (!) 150/100  Pulse: (!) 56  Temp: 98.2 F (36.8 C)  SpO2: 94%   BP Readings from Last 3 Encounters:  05/05/22 (!) 150/100  04/23/22 (!) 160/98  03/02/22 (!) 144/74   Wt Readings from Last 3 Encounters:  05/05/22 206 lb (93.4 kg)  04/23/22 206 lb (93.4 kg)  03/02/22 206 lb 6 oz (93.6 kg)   Body mass index is 37.08 kg/m.    Physical  Exam Constitutional:      General: She is not in acute distress.    Appearance: Normal appearance. She is not ill-appearing.  HENT:     Head: Normocephalic and atraumatic.  Musculoskeletal:     Right lower leg: No edema.     Left lower leg: No edema.  Skin:    General: Skin is warm and dry.     Findings: Rash (Right anterior lower leg-papular rash with a couple areas that are scabbed likely from her itching.  No blisters, no open wounds, no generalized redness) present.  Neurological:     Mental Status: She is alert.            Assessment & Plan:    Rash: Acute Started the end of last week-1-2 days after the first Ozempic injection.  I do not think this is an allergic reaction to Ozempic-this is more localized and she did also have a pedicure and this could be more of a folliculitis Start doxycycline 100 mg twice daily x 7 days Triamcinolone cream twice daily Hold Ozempic for  couple of days-the rash should get better within the next 24-48 hours which will confirm more of a folliculitis Call if no improvement  Bilateral feet pain: Subacute-worse since last week Knifelike stabbing pain Possible neuropathy She does have a foot doctor that she is seen in the past and will call to make an appointment Start B12 supplementation Stressed getting sugars better controlled Discussed gabapentin, Cymbalta to help with the pain-deferred for now Stressed that she cannot keep taking that much ibuprofen due to effects on her stomach and kidneys-also her BP  Elevated blood pressure: Blood pressure has been consistently elevated-mention to her that she probably needs to be on medication She states after her last visit she was monitoring her BP at home and it did become better-she just thought she was anxious at the appointment Stressed monitoring her BP regularly and following up with PCP-most likely needs medication

## 2022-05-05 NOTE — Patient Instructions (Addendum)
     Monitor your BP at home.    Medications changes include :   doxycycline twice daily x 7 days.  Triamcinolone steroid cream twice daily.    Start taking vitamin B12 1000 mcg    See your foot doctor.

## 2022-05-08 ENCOUNTER — Emergency Department (HOSPITAL_BASED_OUTPATIENT_CLINIC_OR_DEPARTMENT_OTHER)
Admission: EM | Admit: 2022-05-08 | Discharge: 2022-05-08 | Disposition: A | Discharge status: Home / Self Care | Payer: PPO | Attending: Emergency Medicine | Admitting: Emergency Medicine

## 2022-05-08 ENCOUNTER — Encounter (HOSPITAL_BASED_OUTPATIENT_CLINIC_OR_DEPARTMENT_OTHER): Payer: Self-pay | Admitting: Emergency Medicine

## 2022-05-08 DIAGNOSIS — E039 Hypothyroidism, unspecified: Secondary | ICD-10-CM | POA: Insufficient documentation

## 2022-05-08 DIAGNOSIS — Z85828 Personal history of other malignant neoplasm of skin: Secondary | ICD-10-CM | POA: Insufficient documentation

## 2022-05-08 DIAGNOSIS — Z7951 Long term (current) use of inhaled steroids: Secondary | ICD-10-CM | POA: Diagnosis not present

## 2022-05-08 DIAGNOSIS — J45909 Unspecified asthma, uncomplicated: Secondary | ICD-10-CM | POA: Diagnosis not present

## 2022-05-08 DIAGNOSIS — I10 Essential (primary) hypertension: Secondary | ICD-10-CM | POA: Insufficient documentation

## 2022-05-08 DIAGNOSIS — Z79899 Other long term (current) drug therapy: Secondary | ICD-10-CM | POA: Insufficient documentation

## 2022-05-08 DIAGNOSIS — E119 Type 2 diabetes mellitus without complications: Secondary | ICD-10-CM | POA: Diagnosis not present

## 2022-05-08 DIAGNOSIS — R21 Rash and other nonspecific skin eruption: Secondary | ICD-10-CM

## 2022-05-08 NOTE — Discharge Instructions (Addendum)
We evaluated you for your rash.  Your rash did not appear to be a dangerous rash such as a skin infection or a drug reaction.  Your rash was most likely caused by contact dermatitis, inflammation due to an exposure on your skin.  Poison ivy is an example of this type of reaction.  Please keep an eye out for any new soaps, detergents, clothing, pets, or other new exposures. Please return to the emergency department if you notice any skin peeling, high fevers, fainting, worsening rash, or any other concerning symptoms.

## 2022-05-08 NOTE — ED Notes (Signed)
Pt was not in room or department when RN went to discharge her.

## 2022-05-08 NOTE — ED Triage Notes (Signed)
Pt arrives pov, steady gait, c/o concern for RT leg infection, endorses itching. Red rash noted. Pt currently taking doxycycline since 1/17. Recently started ozempic

## 2022-05-09 NOTE — ED Provider Notes (Signed)
[facility] Provider Note  CSN: [id number] Arrival date & time: 05/08/22 1809  Chief Complaint(s) Rash  HPI Katrina [name] is a 67 y.o. female with history of diabetes hypertension, hypothyroidism presenting to the emergency department for rash.   Reports 3 weeks of rash.  She reports that it is itchy.  She thinks it began after she received topical treatment during a pedicure on her leg.  She reports that it is not gotten significantly better or worse over this.  She tried doxycycline without relief.  She is also been using triamcinolone ointment.  Denies any new medications, soaps, detergents, pets, other exposure.  Past Medical History Past Medical History:  Diagnosis Date   Asthma 09/27/2021   BCC (basal cell carcinoma of skin) Infundibulocystic 03/11/2008   Inner Left Eye   Chest pain at rest 11/07/2012   Diabetes mellitus without complication ([place])    Eosinophilic esophagitis 23/76/2831   Esophageal stricture    Exercise-induced asthma    GERD (gastroesophageal reflux disease)    Hx of colonic polyps 03/2020   Hypertension 09/27/2017   Hypothyroidism    Sarcoidosis    Shortness of breath 11/08/2012   RECENTLY  & WHEN i EXERCISE"   Solar lentigo 03/11/2008   Back - Atypical   Thyroid disease    Type 2 diabetes with complication ([place]) 51/76/1607   Upper airway cough syndrome    Patient Active Problem List   Diagnosis Date Noted   Lipoma 04/23/2022   Cough 02/08/2022   Wheezing 02/08/2022   Irritable bowel syndrome with diarrhea 01/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   Nausea 01/04/2022   Body aches 12/11/2021   Dark urine 12/11/2021   Flank pain 12/09/2021   Extensor tendonitis of foot 10/19/2021   Left foot pain 10/19/2021   Dyspnea 09/27/2021   Asthma 09/27/2021   Anxiety 09/27/2021   Other fatigue 11/28/2020   Closed fracture of phalanx of right fifth toe with routine healing 09/16/2020   Foot mass, left 09/10/2020   Digital mucinous  cyst of toe of left foot 09/09/2020   Asthma exacerbation 06/28/2020   Hx of colonic polyps 03/2020   Piriformis syndrome of right side 10/04/2018   Abnormal electrocardiogram (ECG) (EKG) 08/22/2018   Hyperlipidemia associated with type 2 diabetes mellitus ([place]) 08/22/2018   Type 2 diabetes with complication ([place]) 37/01/6268   Diarrhea 05/30/2018   Esophageal stricture    Hypertension 09/27/2017   Cough variant asthma vs UACS 01/18/2017   Low back pain associated with a spinal disorder other than radiculopathy or spinal stenosis 03/01/2016   Myalgia 10/09/2015   Upper airway cough syndrome 01/26/2015   Morbid obesity ([place]) 01/26/2015   Pulmonary nodules    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 Authorizing Provider  albuterol (VENTOLIN HFA) 108 (90 Base) MCG/ACT inhaler INHALE 1 TO 2 PUFFS INTO THE LUNGS EVERY 6 HOURS AS NEEDED FOR WHEEZING OR SHORTNESS OF BREATH 02/08/22   Biagio Borg, MD  doxycycline (VIBRA-TABS) 100 MG tablet Take 1 tablet (100 mg total) by mouth 2 (two) times daily for 7 days. 05/05/22 05/12/22  Binnie Rail, MD  ibuprofen (ADVIL) 200 MG tablet Take 200 mg by  mouth every 6 (six) hours as needed for headache or mild pain.    [provider]  levothyroxine (SYNTHROID) 175 MCG tablet TAKE 1 TABLET(175 MCG) BY MOUTH DAILY 04/05/22   Hoyt Koch, MD  mupirocin ointment (BACTROBAN) 2 % Apply 1 application topically daily. Patient taking differently: Apply 1 application  topically as needed. 01/28/21   Lavonna Monarch, MD  omeprazole (PRILOSEC) 20 MG capsule Take 20 mg by mouth daily.    [provider]   Semaglutide, 1 MG/DOSE, 4 MG/3ML SOPN Inject 1 mg as directed once a week. 04/23/22   Hoyt Koch, MD  Semaglutide, 2 MG/DOSE, 8 MG/3ML SOPN Inject 2 mg as directed once a week. 04/23/22   Hoyt Koch, MD  triamcinolone cream (KENALOG) 0.1 % Apply 1 Application topically 2 (two) times daily. 05/05/22   Binnie Rail, MD                                                                                                                                    Past Surgical History Past Surgical History:  Procedure Laterality Date   ABDOMINAL HYSTERECTOMY     Partial   ESOPHAGOGASTRODUODENOSCOPY (EGD) WITH ESOPHAGEAL DILATION N/A 11/10/2012   Procedure: ESOPHAGOGASTRODUODENOSCOPY (EGD) WITH ESOPHAGEAL DILATION;  Surgeon: Gatha Mayer, MD;  Location: [facility];  Service: Endoscopy;  Laterality: N/A;  [place] vs. [place]   VIDEO BRONCHOSCOPY WITH ENDOBRONCHIAL ULTRASOUND Bilateral 01/15/2015   Procedure: VIDEO BRONCHOSCOPY WITH ENDOBRONCHIAL ULTRASOUND of  LEFT LUNG LOWER LOBE and right lung;  Surgeon: Collene Gobble, MD;  Location: [facility];  Service: Thoracic;  Laterality: Bilateral;   Family History Family History  Problem Relation Age of Onset   Diabetes Mother    Stroke Mother    Coronary artery disease Father    Coronary artery disease Brother    Hypothyroidism Maternal Grandmother    Diabetes Maternal Grandfather    Heart disease Paternal Grandmother    Heart disease Paternal Grandfather     Social History Social History   Tobacco Use   Smoking status: Never   Smokeless tobacco: Never  Vaping Use   Vaping Use: Never used  Substance Use Topics   Alcohol use: No   Drug use: No   Allergies Penicillins, Amlodipine, Lisinopril, Metformin and related, and Bactrim ds [sulfamethoxazole-trimethoprim]  Review of Systems Review of Systems  All other systems reviewed and are negative.   Physical Exam Vital Signs  I have reviewed the triage vital signs BP (!) 188/79  (BP Location: Left Arm)   Pulse 70   Temp 98.7 F (37.1 C) (Oral)   Resp 17   SpO2 100%  Physical Exam Vitals and nursing note reviewed.  Constitutional:      Appearance: Normal appearance.  HENT:     Head: Normocephalic and atraumatic.     Mouth/Throat:     Mouth: Mucous membranes are moist.  Eyes:  Conjunctiva/sclera: Conjunctivae normal.  Cardiovascular:     Rate and Rhythm: Normal rate.  Pulmonary:     Effort: Pulmonary effort is normal. No respiratory distress.  Abdominal:     General: Abdomen is flat.  Musculoskeletal:        General: No deformity.  Skin:    General: Skin is warm and dry.     Capillary Refill: Capillary refill takes less than 2 seconds.     Comments: On the right anterior shin there is a erythematous, papular rash without warmth.  No pustular eruption.  No skin sloughing.  No induration.  Rash is blanchable.  Neurological:     General: No focal deficit present.     Mental Status: She is alert. Mental status is at baseline.  Psychiatric:        Mood and Affect: Mood normal.        Behavior: Behavior normal.     ED Results and Treatments Labs (all labs ordered are listed, but only abnormal results are displayed) Labs Reviewed - No data to display                                                                                                                        Radiology No results found.  Pertinent labs & imaging results that were available during my care of the patient were reviewed by me and considered in my medical decision making (see MDM for details).  Medications Ordered in ED Medications - No data to display                                                                                                                                   Procedures Procedures  (including critical care time)  Medical Decision Making / ED Course   MDM:  67 year old female presenting to the emergency department with right shin rash.  Patient  overall well-appearing, physical exam with small rash on anterior shin.  Does not appear consistent with cellulitis.  Does not appear consistent with SJS/TN.  No new medications to suggest other drug eruption.  Does not appear consistent with staphylococcal scalded skin.  Unclear cause, appears most consistent with possible contact dermatitis, could be related to recent topical treatment.  Advise continued use of triamcinolone.  Recommended follow-up with dermatology.  Discussed return precautions including spreading rash, sloughing of skin, blistering, oral sores, fevers or chills, or any other concerning symptoms.  Medicines ordered and prescription drug management: No orders of the defined types were placed in this encounter.   -I have reviewed the patients home medicines and have made adjustments as needed  Social Determinants of Health:  Diagnosis or treatment significantly limited by social determinants of health: obesity  Co morbidities that complicate the patient evaluation  Past Medical History:  Diagnosis Date   Asthma 09/27/2021   BCC (basal cell carcinoma of skin) Infundibulocystic 03/11/2008   Inner Left Eye   Chest pain at rest 11/07/2012   Diabetes mellitus without complication ([place])    Eosinophilic esophagitis 92/02/9416   Esophageal stricture    Exercise-induced asthma    GERD (gastroesophageal reflux disease)    Hx of colonic polyps 03/2020   Hypertension 09/27/2017   Hypothyroidism    Sarcoidosis    Shortness of breath 11/08/2012   RECENTLY  & WHEN i EXERCISE"   Solar lentigo 03/11/2008   Back - Atypical   Thyroid disease    Type 2 diabetes with complication ([place]) 40/81/4481   Upper airway cough syndrome       Dispostion: Disposition decision including need for hospitalization was considered, and patient discharged from emergency department.    Final Clinical Impression(s) / ED Diagnoses Final diagnoses:  Rash     This chart was dictated using  voice recognition software.  Despite best efforts to proofread,  errors can occur which can change the documentation meaning.    Cristie Hem, MD 05/09/22 [phone]

## 2022-05-28 ENCOUNTER — Ambulatory Visit (INDEPENDENT_AMBULATORY_CARE_PROVIDER_SITE_OTHER): Payer: PPO | Admitting: Internal Medicine

## 2022-05-28 ENCOUNTER — Encounter: Payer: Self-pay | Admitting: Internal Medicine

## 2022-05-28 VITALS — BP 120/82 | HR 75 | Temp 99.0°F | Wt 205.0 lb

## 2022-05-28 DIAGNOSIS — L270 Generalized skin eruption due to drugs and medicaments taken internally: Secondary | ICD-10-CM | POA: Diagnosis not present

## 2022-05-28 DIAGNOSIS — E118 Type 2 diabetes mellitus with unspecified complications: Secondary | ICD-10-CM

## 2022-05-28 DIAGNOSIS — M546 Pain in thoracic spine: Secondary | ICD-10-CM

## 2022-05-28 NOTE — Patient Instructions (Signed)
We have sent in flexeril to use for the muscle pain at night time.

## 2022-05-28 NOTE — Assessment & Plan Note (Signed)
Rash on legs consistent with drug induced and coincided with ozempic. She did re-trial ozempic after this resolved and rash did come back stronger than prior. She has had it for 2-3 weeks since last ozempic shot. I have recommended she not re-try glp-1 class. She could try mounjaro for weight loss/diabetes if desired. We talked about risk of similar rash with trulicity or bydureon.

## 2022-05-28 NOTE — Assessment & Plan Note (Signed)
She was lifting a mirror and suspect strain of muscle. Offered muscle relaxer but she has some left at home. Going on 1 week currently and discussed likely timeline of 2-6 weeks for resolution. Continue otc nsaids which she is doing and heating pad which she is already doing. If no improvement or worsening let us know. No red flag signs to indicate need for imaging.

## 2022-05-28 NOTE — Assessment & Plan Note (Signed)
Did try ozempic for control and weight loss but got drug reaction. Would recommend to avoid glp-1 category due to risk of similar reaction. Could likely trial mounjaro if desired.

## 2022-05-28 NOTE — Progress Notes (Signed)
   Subjective:   Patient ID: Katrina [name], female    DOB: [birth year], 67 y.o.   MRN: [id number]  Back Pain Pertinent negatives include no abdominal pain or chest pain.   The patient is a 67 YO female coming in for mid back pain about 1 week duration. Several other concerns including rash on legs related to ozempic.  Review of Systems  Constitutional: Negative.   HENT: Negative.    Eyes: Negative.   Respiratory:  Negative for cough, chest tightness and shortness of breath.   Cardiovascular:  Negative for chest pain, palpitations and leg swelling.  Gastrointestinal:  Negative for abdominal distention, abdominal pain, constipation, diarrhea, nausea and vomiting.  Musculoskeletal:  Positive for back pain.  Skin:  Positive for rash.  Neurological: Negative.   Psychiatric/Behavioral: Negative.      Objective:  Physical Exam Constitutional:      Appearance: She is well-developed.  HENT:     Head: Normocephalic and atraumatic.  Cardiovascular:     Rate and Rhythm: Normal rate and regular rhythm.  Pulmonary:     Effort: Pulmonary effort is normal. No respiratory distress.     Breath sounds: Normal breath sounds. No wheezing or rales.  Abdominal:     General: Bowel sounds are normal. There is no distension.     Palpations: Abdomen is soft.     Tenderness: There is no abdominal tenderness. There is no rebound.  Musculoskeletal:        General: Tenderness present.     Cervical back: Normal range of motion.  Skin:    General: Skin is warm and dry.     Findings: Rash present.     Comments: Rash on shins consistent with drug induced macular and red  Neurological:     Mental Status: She is alert and oriented to person, place, and time.     Coordination: Coordination normal.     Vitals:   05/28/22 1339  BP: 120/82  Pulse: 75  Temp: 99 F (37.2 C)  TempSrc: Oral  SpO2: 94%  Weight: 205 lb (93 kg)    Assessment & Plan:  Visit time 15 minutes in face to face communication  with patient and coordination of care, additional 5 minutes spent in record review, coordination or care, ordering tests, communicating/referring to other healthcare professionals, documenting in medical records all on the same day of the visit for total time 20 minutes spent on the visit.

## 2022-06-01 ENCOUNTER — Telehealth: Payer: Self-pay

## 2022-06-01 NOTE — Telephone Encounter (Signed)
Pt states she is needing a rx for [facility] sent to her pharmacy as they will not give her a quote for the meds until a rx was sent in.

## 2022-06-02 ENCOUNTER — Telehealth: Payer: Self-pay | Admitting: Internal Medicine

## 2022-06-02 MED ORDER — TIRZEPATIDE 7.5 MG/0.5ML ~~LOC~~ SOAJ: 7.5000 mg | SUBCUTANEOUS | 0 refills | Status: DC

## 2022-06-02 MED ORDER — TIRZEPATIDE 2.5 MG/0.5ML ~~LOC~~ SOAJ: 2.5000 mg | SUBCUTANEOUS | 0 refills | Status: DC

## 2022-06-02 MED ORDER — TIRZEPATIDE 5 MG/0.5ML ~~LOC~~ SOAJ: 5.0000 mg | SUBCUTANEOUS | 0 refills | Status: DC

## 2022-06-02 NOTE — Telephone Encounter (Signed)
Patient called stating that she is currently taking Losartan 78m for her blood pressure. She stated that lately her blood pressure has been high. She stated that she checked it an hour ago and it was 173/92. Patient is a little concern and wants to know what she should do since her blood pressure has been high for a week.   Best callback number for patient is [phone]

## 2022-06-02 NOTE — Addendum Note (Signed)
Addended by: Pricilla Holm A on: 06/02/2022 10:56 AM   Modules accepted: Orders

## 2022-06-02 NOTE — Telephone Encounter (Signed)
For now ok to take TWO of the 50 mg losartan , continue to monitor BP as you do and f/u Dr Sharlet Salina 1-2 wks

## 2022-06-02 NOTE — Telephone Encounter (Signed)
Spoke with patient and she was advised on behalf of Dr Jenny Reichmann and to do a follow up within 1-2 weeks

## 2022-06-02 NOTE — Telephone Encounter (Signed)
Sent in first 3 month supply should follow up at that time.

## 2022-06-15 ENCOUNTER — Ambulatory Visit (INDEPENDENT_AMBULATORY_CARE_PROVIDER_SITE_OTHER): Payer: PPO | Admitting: Internal Medicine

## 2022-06-15 VITALS — BP 134/80 | HR 76 | Temp 98.3°F | Ht 62.5 in | Wt 202.0 lb

## 2022-06-15 DIAGNOSIS — R21 Rash and other nonspecific skin eruption: Secondary | ICD-10-CM

## 2022-06-15 DIAGNOSIS — I10 Essential (primary) hypertension: Secondary | ICD-10-CM

## 2022-06-15 DIAGNOSIS — E118 Type 2 diabetes mellitus with unspecified complications: Secondary | ICD-10-CM | POA: Diagnosis not present

## 2022-06-15 MED ORDER — LOSARTAN POTASSIUM 100 MG PO TABS: 100.0000 mg | ORAL_TABLET | Freq: Every day | ORAL | 3 refills | Status: DC

## 2022-06-15 MED ORDER — FLUOCINONIDE EMULSIFIED BASE 0.05 % EX CREA: 1.0000 | TOPICAL_CREAM | Freq: Two times a day (BID) | CUTANEOUS | 0 refills | Status: DC

## 2022-06-15 NOTE — Patient Instructions (Signed)
Ok to change the cram to Lidex (generic)  Ok to continue the losartan 100 mg per day  Ok to continue the mounjaro 2.5 mg weekly  Please continue all other medications as before, and refills have been done if requested.  Please have the pharmacy call with any other refills you may need.  Please keep your appointments with your specialists as you may have planned

## 2022-06-15 NOTE — Progress Notes (Signed)
Patient ID: Katrina [name], female   DOB: April 24, 1955, 67 y.o.   MRN: [id number]        Chief Complaint: follow up right leg rash, htn, dm, obesity       HPI:  Katrina [name] is a 67 y.o. female here with c/o 2 wks onset itchy rash to RLE below the knee pretibial, without fever, chills, trauma.  No prior hx.  Had losartan increased to 100 mg recenlty, and started mounjaro 5 days ago.  Denies worsening reflux, abd pain, dysphagia, n/v, bowel change or blood.  Has started to lose some wt in the past wk.  Pt denies chest pain, increased sob or doe, wheezing, orthopnea, PND, increased LE swelling, palpitations, dizziness or syncope.   Pt denies polydipsia, polyuria, or new focal neuro s/s.   Has dermatology appt for mar 12.  Triam cr not helping       Wt Readings from Last 3 Encounters:  06/15/22 202 lb (91.6 kg)  05/28/22 205 lb (93 kg)  05/05/22 206 lb (93.4 kg)   BP Readings from Last 3 Encounters:  06/15/22 134/80  05/28/22 120/82  05/08/22 (!) 188/79         Past Medical History:  Diagnosis Date   Asthma 09/27/2021   BCC (basal cell carcinoma of skin) Infundibulocystic 03/11/2008   Inner Left Eye   Chest pain at rest 11/07/2012   Diabetes mellitus without complication (HCC)    Eosinophilic esophagitis 0000000   Esophageal stricture    Exercise-induced asthma    GERD (gastroesophageal reflux disease)    Hx of colonic polyps 03/2020   Hypertension 09/27/2017   Hypothyroidism    Sarcoidosis    Shortness of breath 11/08/2012   RECENTLY  & WHEN i EXERCISE"   Solar lentigo 03/11/2008   Back - Atypical   Thyroid disease    Type 2 diabetes with complication ([place]) [id number]   Upper airway cough syndrome    Past Surgical History:  Procedure Laterality Date   ABDOMINAL HYSTERECTOMY     Partial   ESOPHAGOGASTRODUODENOSCOPY (EGD) WITH ESOPHAGEAL DILATION N/A 11/10/2012   Procedure: ESOPHAGOGASTRODUODENOSCOPY (EGD) WITH ESOPHAGEAL DILATION;  Surgeon: Gatha Mayer, MD;  Location: [place];  Service: Endoscopy;  Laterality: N/A;  [place] vs. [place]oon   VIDEO BRONCHOSCOPY WITH ENDOBRONCHIAL ULTRASOUND Bilateral 01/15/2015   Procedure: VIDEO BRONCHOSCOPY WITH ENDOBRONCHIAL ULTRASOUND of  LEFT LUNG LOWER LOBE and right lung;  Surgeon: Collene Gobble, MD;  Location: [facility];  Service: Thoracic;  Laterality: Bilateral;    reports that she has never smoked. She has never used smokeless tobacco. She reports that she does not drink alcohol and does not use drugs. family history includes Coronary artery disease in her brother and father; Diabetes in her maternal grandfather and mother; Heart disease in her paternal grandfather and paternal grandmother; Hypothyroidism in her maternal grandmother; Stroke in her mother. Allergies  Allergen Reactions   Penicillins Shortness Of Breath and Rash    DID THE REACTION INVOLVE: Swelling of the face/tongue/throat, SOB, or low BP? N Sudden or severe rash/hives, skin peeling, or the inside of the mouth or nose? Yes Did it require medical treatment? No When did it last happen?   2010  If all above answers are "NO", may proceed with cephalosporin use.    Amlodipine     Hives Other reaction(s): Other (See Comments) Hives   Lisinopril     Weakness/ muscle aches   Metformin And Related     Elevated heart  rate   Bactrim Ds [Sulfamethoxazole-Trimethoprim] Rash    Upper body and Face redness with heat.     Current Outpatient Medications on File Prior to Visit  Medication Sig Dispense Refill   albuterol (VENTOLIN HFA) 108 (90 Base) MCG/ACT inhaler INHALE 1 TO 2 PUFFS INTO THE LUNGS EVERY 6 HOURS AS NEEDED FOR WHEEZING OR SHORTNESS OF BREATH 6.7 g 2   ibuprofen (ADVIL) 200 MG tablet Take 200 mg by mouth every 6 (six) hours as needed for headache or mild pain.     levothyroxine (SYNTHROID) 175 MCG tablet TAKE 1 TABLET(175 MCG) BY MOUTH DAILY 90 tablet 0   mupirocin ointment (BACTROBAN) 2 % Apply 1 application topically daily. (Patient taking  differently: Apply 1 application  topically as needed.) 22 g 0   omeprazole (PRILOSEC) 20 MG capsule Take 20 mg by mouth daily.     Semaglutide, 1 MG/DOSE, 4 MG/3ML SOPN Inject 1 mg as directed once a week. 3 mL 0   Semaglutide, 2 MG/DOSE, 8 MG/3ML SOPN Inject 2 mg as directed once a week. 3 mL 5   tirzepatide (MOUNJARO) 2.5 MG/0.5ML Pen Inject 2.5 mg into the skin once a week. 2 mL 0   tirzepatide (MOUNJARO) 5 MG/0.5ML Pen Inject 5 mg into the skin once a week. 2 mL 0   tirzepatide (MOUNJARO) 7.5 MG/0.5ML Pen Inject 7.5 mg into the skin once a week. 2 mL 0   triamcinolone cream (KENALOG) 0.1 % Apply 1 Application topically 2 (two) times daily. 30 g 0   No current facility-administered medications on file prior to visit.        ROS:  All others reviewed and negative.  Objective        PE:  BP 134/80 (BP Location: Right Arm, Patient Position: Sitting, Cuff Size: Large)   Pulse 76   Temp 98.3 F (36.8 C) (Oral)   Ht 5' 2.5" (1.588 m)   Wt 202 lb (91.6 kg)   SpO2 95%   BMI 36.36 kg/m                 Constitutional: Pt appears in NAD               HENT: Head: NCAT.                Right Ear: External ear normal.                 Left Ear: External ear normal.                Eyes: . Pupils are equal, round, and reactive to light. Conjunctivae and EOM are normal               Nose: without d/c or deformity               Neck: Neck supple. Gross normal ROM               Cardiovascular: Normal rate and regular rhythm.                 Pulmonary/Chest: Effort normal and breath sounds without rales or wheezing.                               Neurological: Pt is alert. At baseline orientation, motor grossly intact               Skin: Skin is warm. LE edema -  none, has nontender large area erythema with scaliness , no ulcers or red streaks to right leg pretibial               Psychiatric: Pt behavior is normal without agitation   Micro: none  Cardiac tracings I have personally interpreted  today:  none  Pertinent Radiological findings (summarize): none   Lab Results  Component Value Date   WBC 8.3 12/29/2021   HGB 14.7 12/29/2021   HCT 44.5 12/29/2021   PLT 262.0 12/29/2021   GLUCOSE 152 (H) 12/29/2021   CHOL 141 08/22/2018   TRIG 268.0 (H) 08/22/2018   HDL 27.00 (L) 08/22/2018   LDLDIRECT 83.0 08/22/2018   ALT 24 12/29/2021   AST 20 12/29/2021   NA 138 12/29/2021   K 4.3 12/29/2021   CL 103 12/29/2021   CREATININE 0.81 12/29/2021   BUN 17 12/29/2021   CO2 27 12/29/2021   TSH 6.58 (H) 01/11/2022   INR 1.10 01/13/2015   HGBA1C 8.5 (H) 12/08/2021   MICROALBUR 4.2 (H) 07/06/2021   Assessment/Plan:  Katrina [name] is a 67 y.o. White or Caucasian [1] female with  has a past medical history of Asthma (09/27/2021), BCC (basal cell carcinoma of skin) Infundibulocystic (03/11/2008), Chest pain at rest (11/07/2012), Diabetes mellitus without complication ([place]), Eosinophilic esophagitis (0000000), Esophageal stricture, Exercise-induced asthma, GERD (gastroesophageal reflux disease), colonic polyps (03/2020), Hypertension (09/27/2017), Hypothyroidism, Sarcoidosis, Shortness of breath (11/08/2012), Solar lentigo (03/11/2008), Thyroid disease, Type 2 diabetes with complication ([place]) ([id number]), and Upper airway cough syndrome.  Rash C/w dermatitis, for change of topical steroid to lidex asd,  to f/u any worsening symptoms or concerns, also dermatology mar 12  Type 2 diabetes with complication [facility]) Lab Results  Component Value Date   HGBA1C 8.5 (H) 12/08/2021   Uncontrolled, working on better diet, declines lab today, pt to continue current medical treatment  - new mounjaro start, tolerating ok so far   Hypertension BP Readings from Last 3 Encounters:  06/15/22 134/80  05/28/22 120/82  05/08/22 (!) 188/79   Stable, pt to continue medical treatment losartan 100 mg qd  Followup: Return if symptoms worsen or fail to improve.  Cathlean Cower, MD 06/18/2022 10:25  PM [facility]

## 2022-06-18 ENCOUNTER — Encounter: Payer: Self-pay | Admitting: Internal Medicine

## 2022-06-18 DIAGNOSIS — R21 Rash and other nonspecific skin eruption: Secondary | ICD-10-CM | POA: Insufficient documentation

## 2022-06-18 NOTE — Assessment & Plan Note (Signed)
BP Readings from Last 3 Encounters:  06/15/22 134/80  05/28/22 120/82  05/08/22 (!) 188/79   Stable, pt to continue medical treatment losartan 100 mg qd

## 2022-06-18 NOTE — Assessment & Plan Note (Signed)
Lab Results  Component Value Date   HGBA1C 8.5 (H) 12/08/2021   Uncontrolled, working on better diet, declines lab today, pt to continue current medical treatment  - new mounjaro start, tolerating ok so far

## 2022-06-18 NOTE — Assessment & Plan Note (Addendum)
C/w dermatitis, for change of topical steroid to lidex asd,  to f/u any worsening symptoms or concerns, also dermatology mar 12

## 2022-06-30 ENCOUNTER — Other Ambulatory Visit: Payer: Self-pay | Admitting: Internal Medicine

## 2022-07-01 DIAGNOSIS — L570 Actinic keratosis: Secondary | ICD-10-CM | POA: Diagnosis not present

## 2022-07-01 DIAGNOSIS — L817 Pigmented purpuric dermatosis: Secondary | ICD-10-CM | POA: Diagnosis not present

## 2022-07-01 DIAGNOSIS — L720 Epidermal cyst: Secondary | ICD-10-CM | POA: Diagnosis not present

## 2022-07-01 DIAGNOSIS — L918 Other hypertrophic disorders of the skin: Secondary | ICD-10-CM | POA: Diagnosis not present

## 2022-07-01 DIAGNOSIS — L905 Scar conditions and fibrosis of skin: Secondary | ICD-10-CM | POA: Diagnosis not present

## 2022-07-14 ENCOUNTER — Telehealth: Payer: Self-pay | Admitting: Internal Medicine

## 2022-07-14 NOTE — Telephone Encounter (Signed)
Pt tested positive 3/26 pt have a loud wheezing, cough, congestion and sore throat. Temp 100  Pt will like to prescribe something to treat her.  Please give pt a call back with update.  Call back [phone]  [id number] [place], [address] AT [place] Phone: [phone]  Fax: [phone]

## 2022-07-15 NOTE — Telephone Encounter (Signed)
1st date of symptoms was Sunday 07/11/2022 - late afternoon

## 2022-07-15 NOTE — Telephone Encounter (Signed)
Called patient and left message for patient to call office in reference to her message, If patient calls back please get the 1 st day of her symptoms

## 2022-07-19 NOTE — Telephone Encounter (Signed)
Can you call and explain why no one called her back please? She is outside 5 day window at this time and can take otc medications. How is she feeling now?

## 2022-07-26 ENCOUNTER — Telehealth: Payer: Self-pay | Admitting: Internal Medicine

## 2022-07-26 NOTE — Telephone Encounter (Signed)
Pt called, pharmacy need PA on this medication [facility] [address]  [address] [place], [place] [address]  [phone]

## 2022-07-26 NOTE — Telephone Encounter (Signed)
Pt called, pharmacy need PA on this medication [facility] [address]  [address], [place], [place] [address]  [phone] 

## 2022-07-26 NOTE — Telephone Encounter (Signed)
Routed to the wrong person suppose to go to the PA team.

## 2022-07-28 NOTE — Telephone Encounter (Signed)
Which medication are you needing a PA for?

## 2022-07-29 DIAGNOSIS — L300 Nummular dermatitis: Secondary | ICD-10-CM | POA: Diagnosis not present

## 2022-08-02 ENCOUNTER — Telehealth: Payer: Self-pay | Admitting: Internal Medicine

## 2022-08-02 NOTE — Telephone Encounter (Signed)
Patient needs a prior authorization for [facility]. She gave the request ID of [id number]. She also gave [employer]'s phone number of [phone]. Medication needs to be sent to Karin Golden at [facility] in [place].

## 2022-08-02 NOTE — Telephone Encounter (Signed)
Forwarding msg to rx prior auth team./lmb

## 2022-08-10 ENCOUNTER — Telehealth: Payer: Self-pay

## 2022-08-10 NOTE — Telephone Encounter (Signed)
Received request for additional information from [facility], faxed back along with chart notes. Did receive a denial initially due to that information not being answered, usually, submitting after the "decision date" will lead to them reconsidering without need for appeal.

## 2022-08-10 NOTE — Telephone Encounter (Signed)
Faxed required additional information for [facility] PA, continuing documentation in separate encounter.

## 2022-08-12 ENCOUNTER — Telehealth: Payer: Self-pay | Admitting: Internal Medicine

## 2022-08-12 NOTE — Telephone Encounter (Signed)
Patient is unable to get [facility] at any pharmacy and would like to know if she can be switched back to Ozampic. She would like for it to be sent to [id number] [place], [place] - [address] AT [facility] [address]  Best callback is [phone].

## 2022-08-13 ENCOUNTER — Other Ambulatory Visit: Payer: Self-pay | Admitting: Internal Medicine

## 2022-08-13 ENCOUNTER — Telehealth: Payer: Self-pay

## 2022-08-13 MED ORDER — SEMAGLUTIDE (2 MG/DOSE) 8 MG/3ML ~~LOC~~ SOPN: 2.0000 mg | PEN_INJECTOR | SUBCUTANEOUS | 5 refills | Status: DC

## 2022-08-13 MED ORDER — SEMAGLUTIDE (1 MG/DOSE) 4 MG/3ML ~~LOC~~ SOPN: 1.0000 mg | PEN_INJECTOR | SUBCUTANEOUS | 0 refills | Status: DC

## 2022-08-13 MED ORDER — OZEMPIC (0.25 OR 0.5 MG/DOSE) 2 MG/3ML ~~LOC~~ SOPN: 0.5000 mg | PEN_INJECTOR | SUBCUTANEOUS | 0 refills | Status: AC

## 2022-08-13 NOTE — Telephone Encounter (Signed)
I need to know the dose she is currently taking in mg please

## 2022-08-13 NOTE — Telephone Encounter (Signed)
The last dosage was on 2/24 and she was ready to take th 5.

## 2022-08-13 NOTE — Telephone Encounter (Signed)
Called pt she states she was taking 2.5. Inform pt MD will send 0.5 into pharmacy../lm,b

## 2022-08-13 NOTE — Telephone Encounter (Signed)
  Semaglutide,0.25 or 0.5MG /DOS, (OZEMPIC, 0.25 OR 0.5 MG/DOSE,) 2 MG/3ML SOPN    Semaglutide, 1 MG/DOSE, 4 MG/3ML SOPN   Semaglutide, 2 MG/DOSE, 8 MG/3ML SOPN    Pt is needing a PA for all 3 doses of Ozempic to avoid delay in getting the next dose.

## 2022-08-13 NOTE — Addendum Note (Signed)
Addended by: Hillard Danker A on: 08/13/2022 01:39 PM   Modules accepted: Orders

## 2022-08-13 NOTE — Telephone Encounter (Signed)
I see we had newly prescribed mounjaro in Jan. What dose is she currently taking of mounjaro and last dose?

## 2022-08-16 ENCOUNTER — Other Ambulatory Visit (HOSPITAL_COMMUNITY): Payer: Self-pay

## 2022-08-16 ENCOUNTER — Telehealth: Payer: Self-pay

## 2022-08-16 NOTE — Telephone Encounter (Signed)
PA submitted via CMM. Will update in a new telephone encounter once determination is received.

## 2022-08-16 NOTE — Telephone Encounter (Signed)
Pharmacy Patient Advocate Encounter   Received notification that prior authorization for Ozempic (0.25 or 0.5 MG/DOSE) 2MG /3ML pen-injectors is required/requested.  Per Test Claim: PA required   PA submitted on 08/16/22 to (ins) [employer] via CoverMyMeds Key or (Medicaid) confirmation # [id number] Status is pending

## 2022-08-16 NOTE — Telephone Encounter (Signed)
Sent pt mychart msg w/approval../lb

## 2022-08-16 NOTE — Telephone Encounter (Signed)
Pharmacy Patient Advocate Encounter  Prior Authorization for Greggory Keen has been approved by [employer] (ins).    PA # [id number] Effective dates: 08/12/2022 through 08/10/2023

## 2022-08-16 NOTE — Telephone Encounter (Signed)
Patient Advocate Encounter  Prior Authorization for Ozempic (0.25 or 0.5 MG/DOSE) 2MG /3ML pen-injectors has been approved with [employer].    Per [facility] test claim, copay for 28 days supply is $47  PA# [id number] Effective dates: 08/16/22 through 08/16/23

## 2022-08-17 NOTE — Telephone Encounter (Signed)
Sent pt msg vis mychart of approval../lmb

## 2022-08-19 DIAGNOSIS — L300 Nummular dermatitis: Secondary | ICD-10-CM | POA: Diagnosis not present

## 2022-09-07 ENCOUNTER — Ambulatory Visit (INDEPENDENT_AMBULATORY_CARE_PROVIDER_SITE_OTHER): Payer: PPO | Admitting: Internal Medicine

## 2022-09-07 ENCOUNTER — Encounter: Payer: Self-pay | Admitting: Internal Medicine

## 2022-09-07 VITALS — BP 120/88 | HR 64 | Temp 98.3°F | Ht 62.5 in | Wt 199.0 lb

## 2022-09-07 DIAGNOSIS — E118 Type 2 diabetes mellitus with unspecified complications: Secondary | ICD-10-CM | POA: Diagnosis not present

## 2022-09-07 DIAGNOSIS — E1169 Type 2 diabetes mellitus with other specified complication: Secondary | ICD-10-CM | POA: Diagnosis not present

## 2022-09-07 DIAGNOSIS — Z7985 Long-term (current) use of injectable non-insulin antidiabetic drugs: Secondary | ICD-10-CM

## 2022-09-07 DIAGNOSIS — E039 Hypothyroidism, unspecified: Secondary | ICD-10-CM

## 2022-09-07 DIAGNOSIS — N644 Mastodynia: Secondary | ICD-10-CM

## 2022-09-07 DIAGNOSIS — E785 Hyperlipidemia, unspecified: Secondary | ICD-10-CM

## 2022-09-07 LAB — CBC
HCT: 41.3 % (ref 36.0–46.0)
Hemoglobin: 13.5 g/dL (ref 12.0–15.0)
MCHC: 32.7 g/dL (ref 30.0–36.0)
MCV: 86.1 fl (ref 78.0–100.0)
Platelets: 273 10*3/uL (ref 150.0–400.0)
RBC: 4.8 Mil/uL (ref 3.87–5.11)
RDW: 14.5 % (ref 11.5–15.5)
WBC: 7.3 10*3/uL (ref 4.0–10.5)

## 2022-09-07 LAB — TSH: TSH: 1.99 u[IU]/mL (ref 0.35–5.50)

## 2022-09-07 LAB — LIPID PANEL
Cholesterol: 124 mg/dL (ref 0–200)
HDL: 25.6 mg/dL — ABNORMAL LOW (ref >39.00)
NonHDL: 98.34
Total CHOL/HDL Ratio: 5
Triglycerides: 207 mg/dL — ABNORMAL HIGH (ref 0.0–149.0)
VLDL: 41.4 mg/dL — ABNORMAL HIGH (ref 0.0–40.0)

## 2022-09-07 LAB — MICROALBUMIN / CREATININE URINE RATIO
Creatinine,U: 101.2 mg/dL
Microalb Creat Ratio: 4.5 mg/g (ref 0.0–30.0)
Microalb, Ur: 4.6 mg/dL — ABNORMAL HIGH (ref 0.0–1.9)

## 2022-09-07 LAB — COMPREHENSIVE METABOLIC PANEL
ALT: 24 U/L (ref 0–35)
AST: 22 U/L (ref 0–37)
Albumin: 4.1 g/dL (ref 3.5–5.2)
Alkaline Phosphatase: 79 U/L (ref 39–117)
BUN: 12 mg/dL (ref 6–23)
CO2: 28 mEq/L (ref 19–32)
Calcium: 9.5 mg/dL (ref 8.4–10.5)
Chloride: 102 mEq/L (ref 96–112)
Creatinine, Ser: 0.67 mg/dL (ref 0.40–1.20)
GFR: 89.77 mL/min (ref >60.00)
Glucose, Bld: 135 mg/dL — ABNORMAL HIGH (ref 70–99)
Potassium: 4 mEq/L (ref 3.5–5.1)
Sodium: 139 mEq/L (ref 135–145)
Total Bilirubin: 0.7 mg/dL (ref 0.2–1.2)
Total Protein: 7.3 g/dL (ref 6.0–8.3)

## 2022-09-07 LAB — LDL CHOLESTEROL, DIRECT: Direct LDL: 70 mg/dL

## 2022-09-07 LAB — HEMOGLOBIN A1C: Hgb A1c MFr Bld: 7.8 % — ABNORMAL HIGH (ref 4.6–6.5)

## 2022-09-07 NOTE — Assessment & Plan Note (Signed)
Ordered left diagnostic mammogram. Suspect that ozempic is causing decreased GI motility and more gas/bloating as etiology. This pain did decrease with gas medicine otc.

## 2022-09-07 NOTE — Progress Notes (Signed)
   Subjective:   Patient ID: Katrina [name], female    DOB: [birth year], 67 y.o.   MRN: [id number]  HPI The patient is a 67 YO female coming in for follow up and left breast/chest pain. Associated with ozempic and stomach gas. Taken gas medicine and this has helped some.   Review of Systems  Constitutional: Negative.   HENT: Negative.    Eyes: Negative.   Respiratory:  Negative for cough, chest tightness and shortness of breath.   Cardiovascular:  Negative for chest pain, palpitations and leg swelling.       Breast pain  Gastrointestinal:  Positive for abdominal distention. Negative for abdominal pain, constipation, diarrhea, nausea and vomiting.  Musculoskeletal: Negative.   Skin: Negative.   Neurological: Negative.   Psychiatric/Behavioral: Negative.      Objective:  Physical Exam Constitutional:      Appearance: She is well-developed.  HENT:     Head: Normocephalic and atraumatic.  Cardiovascular:     Rate and Rhythm: Normal rate and regular rhythm.  Pulmonary:     Effort: Pulmonary effort is normal. No respiratory distress.     Breath sounds: Normal breath sounds. No wheezing or rales.  Abdominal:     General: Bowel sounds are normal. There is no distension.     Palpations: Abdomen is soft.     Tenderness: There is no abdominal tenderness. There is no rebound.  Musculoskeletal:     Cervical back: Normal range of motion.  Skin:    General: Skin is warm and dry.  Neurological:     Mental Status: She is alert and oriented to person, place, and time.     Coordination: Coordination normal.     Vitals:   09/07/22 0946  BP: 120/88  Pulse: 64  Temp: 98.3 F (36.8 C)  TempSrc: Oral  SpO2: 97%  Weight: 199 lb (90.3 kg)  Height: 5' 2.5" (1.588 m)    Assessment & Plan:

## 2022-09-07 NOTE — Assessment & Plan Note (Signed)
Checking lipid panel and adjust as needed. Not on statin currently.  

## 2022-09-07 NOTE — Assessment & Plan Note (Signed)
Checking TSH and adjust synthroid 175 mcg daily as needed.

## 2022-09-07 NOTE — Patient Instructions (Signed)
We will get the mammogram.      

## 2022-09-07 NOTE — Assessment & Plan Note (Signed)
Checking HgA1c to update. She is still titrating ozempic and is on 0.5 mg weekly currently. She is having good results and would like to continue titration as able to 2 mg weekly in next 2 months. Follow up 3-6 months. On ARB.

## 2022-09-07 NOTE — Assessment & Plan Note (Signed)
BMI 35 and complicated by diabetes and hyperlipidemia. Taking ozempic and titrating up. Weight is down 5-6 pounds already.

## 2022-10-14 ENCOUNTER — Other Ambulatory Visit: Payer: Self-pay | Admitting: Internal Medicine

## 2022-10-20 ENCOUNTER — Other Ambulatory Visit: Payer: Self-pay | Admitting: Internal Medicine

## 2022-10-20 DIAGNOSIS — N644 Mastodynia: Secondary | ICD-10-CM

## 2022-11-30 ENCOUNTER — Ambulatory Visit
Admission: RE | Admit: 2022-11-30 | Discharge: 2022-11-30 | Disposition: A | Discharge status: Home / Self Care | Payer: PPO | Source: Ambulatory Visit | Attending: Internal Medicine | Admitting: Internal Medicine

## 2022-11-30 DIAGNOSIS — N644 Mastodynia: Secondary | ICD-10-CM

## 2022-12-09 ENCOUNTER — Encounter: Payer: Self-pay | Admitting: Internal Medicine

## 2022-12-09 NOTE — Progress Notes (Signed)
Subjective:    Patient ID: Katrina [name], female    DOB: 09-14-1955, 67 y.o.   MRN: [id number]      HPI Katrina [name] is here for  Chief Complaint  Patient presents with   leg cramps    Leg cramps in back of both calfs   Headache    Headache that starts on the side of her head and wraps around to the back of her head x 3 days; She wants to know if BP medication is related to her symptoms   Taking losartan at night - it was making her sleepy.  She was taking it during the day, but had to switch because of the fatigue.  She feels like she is urinating all the time at night.  4 times at night.  She denies increased urination during the day.  She is not sure if it is related to the losartan or something else.  She noticed that after taking the medication at night.  Leg cramps  -getting leg cramps at night and during the day.  Bilateral calfs.  She is probably not drinking as much water as she should.   Headaches - started last week.  Typically she does not get headaches, except with weather changes.  Starts after lunch  - constant x 3 days.  Advil not helping.  Has been taking it daily.       On ozempic - taste has changed.   Now craves coffee-never drink it before.  Diet has changed.  Does not drink enough.  Loss taste for unsweet tea - drinking water.  Eating more carbs  Eating a lot of veges, fruit.  She is a little disappointed that she has not lost more weight.  She does not eat a lot of meat.  She is not sure if she is getting her protein.  She does walk a lot during the day and can get 10,000 steps and at work.  Has intermittent shooting pain in her feet-feels like nerve pain.  She has felt this in her hands at times.   Medications and allergies reviewed with patient and updated if appropriate.  Current Outpatient Medications on File Prior to Visit  Medication Sig Dispense Refill   albuterol (VENTOLIN HFA) 108 (90 Base) MCG/ACT inhaler INHALE 1 TO 2 PUFFS INTO THE LUNGS EVERY 6  HOURS AS NEEDED FOR WHEEZING OR SHORTNESS OF BREATH 6.7 g 2   fluocinonide-emollient (LIDEX-E) 0.05 % cream Apply 1 Application topically 2 (two) times daily. 60 g 0   ibuprofen (ADVIL) 200 MG tablet Take 200 mg by mouth every 6 (six) hours as needed for headache or mild pain.     levothyroxine (SYNTHROID) 175 MCG tablet TAKE 1 TABLET(175 MCG) BY MOUTH DAILY 90 tablet 0   losartan (COZAAR) 100 MG tablet Take 1 tablet (100 mg total) by mouth daily. 90 tablet 3   mupirocin ointment (BACTROBAN) 2 % Apply 1 application topically daily. (Patient taking differently: Apply 1 application  topically as needed.) 22 g 0   omeprazole (PRILOSEC) 20 MG capsule Take 20 mg by mouth daily.     Semaglutide, 2 MG/DOSE, 8 MG/3ML SOPN Inject 2 mg as directed once a week. 3 mL 5   triamcinolone cream (KENALOG) 0.1 % Apply 1 Application topically 2 (two) times daily. 30 g 0   No current facility-administered medications on file prior to visit.    Review of Systems  Constitutional:  Negative for fever.  Eyes:  Positive for  visual disturbance (blurry).  Respiratory:  Negative for shortness of breath.   Cardiovascular:  Negative for chest pain and palpitations.  Gastrointestinal:  Positive for constipation (last week) and nausea. Negative for abdominal pain and diarrhea.  Genitourinary:  Positive for frequency (at night). Negative for dysuria.  Musculoskeletal:        Leg cramping  Neurological:  Positive for light-headedness (last couple of days) and headaches (since last week).       Objective:   Vitals:   12/10/22 1018  BP: (!) 142/80  Pulse: 62  Temp: 97.8 F (36.6 C)  SpO2: 99%   BP Readings from Last 3 Encounters:  12/10/22 (!) 142/80  09/07/22 120/88  06/15/22 134/80   Wt Readings from Last 3 Encounters:  12/10/22 194 lb (88 kg)  09/07/22 199 lb (90.3 kg)  06/15/22 202 lb (91.6 kg)   Body mass index is 34.92 kg/m.    Physical Exam Constitutional:      General: She is not in acute  distress.    Appearance: Normal appearance.  HENT:     Head: Normocephalic and atraumatic.  Eyes:     Conjunctiva/sclera: Conjunctivae normal.  Cardiovascular:     Rate and Rhythm: Normal rate and regular rhythm.     Heart sounds: Normal heart sounds.  Pulmonary:     Effort: Pulmonary effort is normal. No respiratory distress.     Breath sounds: Normal breath sounds. No wheezing.  Musculoskeletal:     Cervical back: Neck supple.     Right lower leg: No edema.     Left lower leg: No edema.  Lymphadenopathy:     Cervical: No cervical adenopathy.  Skin:    General: Skin is warm and dry.     Findings: No rash.  Neurological:     Mental Status: She is alert. Mental status is at baseline.  Psychiatric:        Mood and Affect: Mood normal.        Behavior: Behavior normal.            Assessment & Plan:    Leg cramping: Acute Has bilateral calf pain at night and during the day Possibly related to dehydration-she admits she does not drink enough water Increase water intake Has also had some increased urination-rule out infection, check A1c to make sure sugars are not high Will check magnesium, CMP   Headache: Acute Started last week Has had a headache every day For the past 3 days has been taking Advil on a regular basis-advised to hold Advil so she does not get rebound headaches Likely to related to what ever else is going on Rule out UTI Check A1c to make sure sugars are not too high Blood pressure minimally elevated and I doubt that is causing her BP to be up Increase water intake which could be contributing   Diabetes: Chronic Last A1c was hide elevated Currently taking Ozempic 2 mg weekly, which is helping but she is not drinking enough water and may not be getting enough protein.  She may also be having some side effects from this Check A1c given current symptoms  Nerve pain-feet and hands: Has electrical-like pain intermittently in feet and occasionally  in hands ?  Neuropathy from diabetes Check B12 level to rule out B12 deficiency   Urinary frequency Acute Experiencing increased urinary frequency-especially at night Has been getting up and going to the bathroom 4 times at night, but does not notice much increase in urination during  the day ?  UTI-check UA, urine culture ?  Related to hyperglycemia-check A1c Less likely related to losartan since she has been on it and has not done that to her in the past   Depending on results can consider adjusting medication.  Discussed that we could change losartan to something else to see if that helps with the fatigue-we will hold off for now until we get the blood work results    I spent 30 minutes dedicated to the care of this patient on the date of this encounter including review of last labs,  obtaining history, communicating with the patient, tests, and documenting clinical information in the EHR

## 2022-12-09 NOTE — Patient Instructions (Addendum)
     Avoid advil and increase your water intake.   Blood work was ordered.   The lab is on the first floor.    Medications changes include :  none      Return if symptoms worsen or fail to improve.

## 2022-12-10 ENCOUNTER — Ambulatory Visit: Payer: PPO | Admitting: Internal Medicine

## 2022-12-10 VITALS — BP 142/80 | HR 62 | Temp 97.8°F | Ht 62.5 in | Wt 194.0 lb

## 2022-12-10 DIAGNOSIS — G4489 Other headache syndrome: Secondary | ICD-10-CM | POA: Diagnosis not present

## 2022-12-10 DIAGNOSIS — M792 Neuralgia and neuritis, unspecified: Secondary | ICD-10-CM | POA: Diagnosis not present

## 2022-12-10 DIAGNOSIS — Z7985 Long-term (current) use of injectable non-insulin antidiabetic drugs: Secondary | ICD-10-CM | POA: Diagnosis not present

## 2022-12-10 DIAGNOSIS — R252 Cramp and spasm: Secondary | ICD-10-CM | POA: Diagnosis not present

## 2022-12-10 DIAGNOSIS — E118 Type 2 diabetes mellitus with unspecified complications: Secondary | ICD-10-CM

## 2022-12-10 DIAGNOSIS — R350 Frequency of micturition: Secondary | ICD-10-CM

## 2022-12-10 LAB — COMPREHENSIVE METABOLIC PANEL
ALT: 17 U/L (ref 0–35)
AST: 18 U/L (ref 0–37)
Albumin: 4 g/dL (ref 3.5–5.2)
Alkaline Phosphatase: 85 U/L (ref 39–117)
BUN: 11 mg/dL (ref 6–23)
CO2: 30 mEq/L (ref 19–32)
Calcium: 9.5 mg/dL (ref 8.4–10.5)
Chloride: 102 mEq/L (ref 96–112)
Creatinine, Ser: 0.8 mg/dL (ref 0.40–1.20)
GFR: 75.54 mL/min (ref >60.00)
Glucose, Bld: 126 mg/dL — ABNORMAL HIGH (ref 70–99)
Potassium: 4.6 mEq/L (ref 3.5–5.1)
Sodium: 139 mEq/L (ref 135–145)
Total Bilirubin: 0.5 mg/dL (ref 0.2–1.2)
Total Protein: 7.4 g/dL (ref 6.0–8.3)

## 2022-12-10 LAB — CBC WITH DIFFERENTIAL/PLATELET
Basophils Absolute: 0.1 10*3/uL (ref 0.0–0.1)
Basophils Relative: 0.8 % (ref 0.0–3.0)
Eosinophils Absolute: 0.2 10*3/uL (ref 0.0–0.7)
Eosinophils Relative: 2.1 % (ref 0.0–5.0)
HCT: 43.8 % (ref 36.0–46.0)
Hemoglobin: 14.3 g/dL (ref 12.0–15.0)
Lymphocytes Relative: 21.1 % (ref 12.0–46.0)
Lymphs Abs: 1.9 10*3/uL (ref 0.7–4.0)
MCHC: 32.8 g/dL (ref 30.0–36.0)
MCV: 84.5 fl (ref 78.0–100.0)
Monocytes Absolute: 0.4 10*3/uL (ref 0.1–1.0)
Monocytes Relative: 4.8 % (ref 3.0–12.0)
Neutro Abs: 6.4 10*3/uL (ref 1.4–7.7)
Neutrophils Relative %: 71.2 % (ref 43.0–77.0)
Platelets: 289 10*3/uL (ref 150.0–400.0)
RBC: 5.18 Mil/uL — ABNORMAL HIGH (ref 3.87–5.11)
RDW: 13.6 % (ref 11.5–15.5)
WBC: 9 10*3/uL (ref 4.0–10.5)

## 2022-12-10 LAB — URINALYSIS, ROUTINE W REFLEX MICROSCOPIC
Bilirubin Urine: NEGATIVE
Hgb urine dipstick: NEGATIVE
Ketones, ur: NEGATIVE
Nitrite: NEGATIVE
Specific Gravity, Urine: 1.02 (ref 1.000–1.030)
Total Protein, Urine: NEGATIVE
Urine Glucose: NEGATIVE
Urobilinogen, UA: 0.2 (ref 0.0–1.0)
pH: 6 (ref 5.0–8.0)

## 2022-12-10 LAB — VITAMIN B12: Vitamin B-12: 247 pg/mL (ref 211–911)

## 2022-12-10 LAB — HEMOGLOBIN A1C: Hgb A1c MFr Bld: 6.4 % (ref 4.6–6.5)

## 2022-12-10 LAB — MAGNESIUM: Magnesium: 1.8 mg/dL (ref 1.5–2.5)

## 2022-12-10 MED ORDER — NITROFURANTOIN MONOHYD MACRO 100 MG PO CAPS: 100.0000 mg | ORAL_CAPSULE | Freq: Two times a day (BID) | ORAL | 0 refills | Status: AC

## 2022-12-10 NOTE — Addendum Note (Signed)
Addended by: Pincus Sanes on: 12/10/2022 04:54 PM   Modules accepted: Orders

## 2022-12-11 LAB — URINE CULTURE

## 2022-12-14 ENCOUNTER — Ambulatory Visit (INDEPENDENT_AMBULATORY_CARE_PROVIDER_SITE_OTHER): Payer: PPO

## 2022-12-14 VITALS — Ht 62.5 in | Wt 194.0 lb

## 2022-12-14 DIAGNOSIS — Z0000 Encounter for general adult medical examination without abnormal findings: Secondary | ICD-10-CM | POA: Diagnosis not present

## 2022-12-14 NOTE — Progress Notes (Cosign Needed)
Subjective:   Katrina [name] is a 67 y.o. female who presents for Medicare Annual (Subsequent) preventive examination.  Visit Complete: Virtual  I connected with  Birdie Sons on 12/14/22 by a audio enabled telemedicine application and verified that I am speaking with the correct person using two identifiers.  Patient Location: Home  Provider Location: Office/Clinic  I discussed the limitations of evaluation and management by telemedicine. The patient expressed understanding and agreed to proceed.  Vital Signs: Because this visit was a virtual/telehealth visit, some criteria may be missing or patient reported. Any vitals not documented were not able to be obtained and vitals that have been documented are patient reported.    Review of Systems     Cardiac Risk Factors include: advanced age (>28men, >38 women);dyslipidemia;family history of premature cardiovascular disease;hypertension;obesity (BMI >30kg/m2)     Objective:    Today's Vitals   12/14/22 1002  Weight: 194 lb (88 kg)  Height: 5' 2.5" (1.588 m)  PainSc: 6   PainLoc: Head   Body mass index is 34.92 kg/m.     12/14/2022   10:05 AM 05/08/2022    6:34 PM 01/01/2022    3:50 PM 12/16/2021    6:27 PM 12/29/2020   10:54 AM 10/10/2020    2:47 PM 10/17/2019    9:12 PM  Advanced Directives  Does Patient Have a Medical Advance Directive? No No No No No No No  Would patient like information on creating a medical advance directive? No - Patient declined  No - Patient declined  Yes (MAU/Ambulatory/Procedural Areas - Information given) No - Patient declined No - Patient declined    Current Medications (verified) Outpatient Encounter Medications as of 12/14/2022  Medication Sig   albuterol (VENTOLIN HFA) 108 (90 Base) MCG/ACT inhaler INHALE 1 TO 2 PUFFS INTO THE LUNGS EVERY 6 HOURS AS NEEDED FOR WHEEZING OR SHORTNESS OF BREATH   fluocinonide-emollient (LIDEX-E) 0.05 % cream Apply 1 Application topically 2 (two) times daily.    ibuprofen (ADVIL) 200 MG tablet Take 200 mg by mouth every 6 (six) hours as needed for headache or mild pain.   levothyroxine (SYNTHROID) 175 MCG tablet TAKE 1 TABLET(175 MCG) BY MOUTH DAILY   losartan (COZAAR) 100 MG tablet Take 1 tablet (100 mg total) by mouth daily.   mupirocin ointment (BACTROBAN) 2 % Apply 1 application topically daily. (Patient taking differently: Apply 1 application  topically as needed.)   nitrofurantoin, macrocrystal-monohydrate, (MACROBID) 100 MG capsule Take 1 capsule (100 mg total) by mouth 2 (two) times daily for 7 days.   omeprazole (PRILOSEC) 20 MG capsule Take 20 mg by mouth daily.   Semaglutide, 2 MG/DOSE, 8 MG/3ML SOPN Inject 2 mg as directed once a week.   triamcinolone cream (KENALOG) 0.1 % Apply 1 Application topically 2 (two) times daily.   No facility-administered encounter medications on file as of 12/14/2022.    Allergies (verified) Penicillins, Amlodipine, Lisinopril, Metformin and related, and Bactrim ds [sulfamethoxazole-trimethoprim]   History: Past Medical History:  Diagnosis Date   Asthma 09/27/2021   BCC (basal cell carcinoma of skin) Infundibulocystic 03/11/2008   Inner Left Eye   Chest pain at rest 11/07/2012   Diabetes mellitus without complication (HCC)    Eosinophilic esophagitis 11/10/2012   Esophageal stricture    Exercise-induced asthma    GERD (gastroesophageal reflux disease)    Hx of colonic polyps 03/2020   Hypertension 09/27/2017   Hypothyroidism    Sarcoidosis    Shortness of breath 11/08/2012  RECENTLY  & WHEN i EXERCISE"   Solar lentigo 03/11/2008   Back - Atypical   Thyroid disease    Type 2 diabetes with complication (HCC) 08/22/2018   Upper airway cough syndrome    Past Surgical History:  Procedure Laterality Date   ABDOMINAL HYSTERECTOMY     Partial   ESOPHAGOGASTRODUODENOSCOPY (EGD) WITH ESOPHAGEAL DILATION N/A 11/10/2012   Procedure: ESOPHAGOGASTRODUODENOSCOPY (EGD) WITH ESOPHAGEAL DILATION;  Surgeon:  Iva Boop, MD;  Location: [facility] ENDOSCOPY;  Service: Endoscopy;  Laterality: N/A;  [place] vs. Balloon   VIDEO BRONCHOSCOPY WITH ENDOBRONCHIAL ULTRASOUND Bilateral 01/15/2015   Procedure: VIDEO BRONCHOSCOPY WITH ENDOBRONCHIAL ULTRASOUND of  LEFT LUNG LOWER LOBE and right lung;  Surgeon: Leslye Peer, MD;  Location: [facility] OR;  Service: Thoracic;  Laterality: Bilateral;   Family History  Problem Relation Age of Onset   Diabetes Mother    Stroke Mother    Coronary artery disease Father    Coronary artery disease Brother    Hypothyroidism Maternal Grandmother    Diabetes Maternal Grandfather    Heart disease Paternal Grandmother    Heart disease Paternal Grandfather    Social History   Socioeconomic History   Marital status: Divorced    Spouse name: Not on file   Number of children: 3   Years of education: 14   Highest education level: Not on file  Occupational History   Occupation: Unemployed  Tobacco Use   Smoking status: Never   Smokeless tobacco: Never  Vaping Use   Vaping status: Never Used  Substance and Sexual Activity   Alcohol use: No   Drug use: No   Sexual activity: Not on file  Other Topics Concern   Not on file  Social History Narrative   Fun: Anything and everything   Social Determinants of Health   Financial Resource Strain: Low Risk  (12/14/2022)   Overall Financial Resource Strain (CARDIA)    Difficulty of Paying Living Expenses: Not hard at all  Food Insecurity: No Food Insecurity (12/14/2022)   Hunger Vital Sign    Worried About Running Out of Food in the Last Year: Never true    Ran Out of Food in the Last Year: Never true  Transportation Needs: No Transportation Needs (12/14/2022)   PRAPARE - Administrator, Civil Service (Medical): No    Lack of Transportation (Non-Medical): No  Physical Activity: Sufficiently Active (12/14/2022)   Exercise Vital Sign    Days of Exercise per Week: 7 days    Minutes of Exercise per Session: 30 min   Stress: No Stress Concern Present (12/14/2022)   [employer] of Occupational Health - Occupational Stress Questionnaire    Feeling of Stress : Not at all  Social Connections: Moderately Isolated (12/14/2022)   Social Connection and Isolation Panel [NHANES]    Frequency of Communication with Friends and Family: More than three times a week    Frequency of Social Gatherings with Friends and Family: More than three times a week    Attends Religious Services: More than 4 times per year    Active Member of [employer] or Organizations: No    Attends Banker Meetings: Never    Marital Status: Divorced    Tobacco Counseling Counseling given: Not Answered   Clinical Intake:  Pre-visit preparation completed: Yes  Pain : 0-10 Pain Score: 6  Pain Location: Head     BMI - recorded: 34.92 Nutritional Status: BMI > 30  Obese Nutritional Risks: None Diabetes:  Yes CBG done?: No Did pt. bring in CBG monitor from home?: No  How often do you need to have someone help you when you read instructions, pamphlets, or other written materials from your doctor or pharmacy?: 1 - Never What is the last grade level you completed in school?: 2 years of college  Interpreter Needed?: No  Information entered by :: Susie Cassette, LPN.   Activities of Daily Living    12/14/2022   10:07 AM 01/01/2022    3:50 PM  In your present state of health, do you have any difficulty performing the following activities:  Hearing? 0 0  Vision? 0 0  Difficulty concentrating or making decisions? 0 0  Walking or climbing stairs? 0 0  Dressing or bathing? 0 0  Doing errands, shopping? 0 0  Preparing Food and eating ? N N  Using the Toilet? N N  In the past six months, have you accidently leaked urine? Y N  Do you have problems with loss of bowel control? N N  Managing your Medications? N N  Managing your Finances? N N  Housekeeping or managing your Housekeeping? N N    Patient Care  Team: Myrlene Broker, MD as PCP - General (Internal Medicine) Rennis Golden Lisette Abu, MD as PCP - Cardiology (Cardiology) Ellis Parents, OD as Consulting Physician (Optometry) Mateo Flow, MD as Consulting Physician (Ophthalmology)  Indicate any recent Medical Services you may have received from other than [facility] providers in the past year (date may be approximate).     Assessment:   This is a routine wellness examination for [place].  Hearing/Vision screen Hearing Screening - Comments:: Patient denied any hearing difficulty.   No hearing aids.   Vision Screening - Comments:: Patient does wear corrective lenses/contacts.  Annual eye exam done by: [facility]    Dietary issues and exercise activities discussed:     Goals Addressed             This Visit's Progress    Patient Stated: Continue to work on losing weight and controlling my glucose numbers.        Depression Screen    12/14/2022   10:06 AM 12/10/2022   10:28 AM 06/15/2022    2:28 PM 05/28/2022    1:42 PM 04/23/2022   10:54 AM 01/01/2022    3:48 PM 09/25/2021   10:26 AM  PHQ 2/9 Scores  PHQ - 2 Score 0 0 0 0 0 0 0  PHQ- 9 Score 0 0 0 0 0  0    Fall Risk    12/14/2022   10:06 AM 12/10/2022   10:28 AM 09/07/2022    9:48 AM 06/15/2022    2:28 PM 05/28/2022    1:42 PM  Fall Risk   Falls in the past year? 0 0 0 0 0  Number falls in past yr: 0 0 0 0 0  Injury with Fall? 0 0  0 0  Risk for fall due to : No Fall Risks No Fall Risks  No Fall Risks   Follow up Falls prevention discussed Falls evaluation completed  Falls evaluation completed;Education provided     MEDICARE RISK AT HOME: Medicare Risk at Home Any stairs in or around the home?: No If so, are there any without handrails?: No Home free of loose throw rugs in walkways, pet beds, electrical cords, etc?: Yes Adequate lighting in your home to reduce risk of falls?: Yes Life alert?: No Use of a cane, walker or  w/c?: No Grab bars in the  bathroom?: No Shower chair or bench in shower?: No Elevated toilet seat or a handicapped toilet?: No  TIMED UP AND GO:  Was the test performed?  No    Cognitive Function:        12/14/2022   10:07 AM 01/01/2022    3:50 PM  6CIT Screen  What Year? 0 points 0 points  What month? 0 points 0 points  What time? 0 points 0 points  Count back from 20 0 points 0 points  Months in reverse 0 points 0 points  Repeat phrase 0 points 0 points  Total Score 0 points 0 points    Immunizations Immunization History  Administered Date(s) Administered   Moderna SARS-COV2 Booster Vaccination 03/29/2020   PFIZER(Purple Top)SARS-COV-2 Vaccination 05/10/2019, 05/31/2019    TDAP status: Due, Education has been provided regarding the importance of this vaccine. Advised may receive this vaccine at local pharmacy or Health Dept. Aware to provide a copy of the vaccination record if obtained from local pharmacy or Health Dept. Verbalized acceptance and understanding.  Flu Vaccine status: Declined, Education has been provided regarding the importance of this vaccine but patient still declined. Advised may receive this vaccine at local pharmacy or Health Dept. Aware to provide a copy of the vaccination record if obtained from local pharmacy or Health Dept. Verbalized acceptance and understanding.  Pneumococcal vaccine status: Declined,  Education has been provided regarding the importance of this vaccine but patient still declined. Advised may receive this vaccine at local pharmacy or Health Dept. Aware to provide a copy of the vaccination record if obtained from local pharmacy or Health Dept. Verbalized acceptance and understanding.   Covid-19 vaccine status: Declined, Education has been provided regarding the importance of this vaccine but patient still declined. Advised may receive this vaccine at local pharmacy or Health Dept.or vaccine clinic. Aware to provide a copy of the vaccination record if obtained  from local pharmacy or Health Dept. Verbalized acceptance and understanding.  Qualifies for Shingles Vaccine? Yes   Zostavax completed No   Shingrix Completed?: No.    Education has been provided regarding the importance of this vaccine. Patient has been advised to call insurance company to determine out of pocket expense if they have not yet received this vaccine. Advised may also receive vaccine at local pharmacy or Health Dept. Verbalized acceptance and understanding.  Screening Tests Health Maintenance  Topic Date Due   DTaP/Tdap/Td (1 - Tdap) Never done   Zoster Vaccines- Shingrix (1 of 2) Never done   DEXA SCAN  Never done   COVID-19 Vaccine (3 - Pfizer risk series) 04/26/2020   Pneumonia Vaccine 51+ Years old (1 of 1 - PCV) 01/01/2023 (Originally 03/13/2019)   INFLUENZA VACCINE  07/18/2023 (Originally 11/18/2022)   OPHTHALMOLOGY EXAM  03/17/2023   FOOT EXAM  04/24/2023   HEMOGLOBIN A1C  06/12/2023   Diabetic kidney evaluation - Urine ACR  09/07/2023   Diabetic kidney evaluation - eGFR measurement  12/10/2023   Medicare Annual Wellness (AWV)  12/14/2023   MAMMOGRAM  02/13/2024   Colonoscopy  04/03/2027   Hepatitis C Screening  Completed   HPV VACCINES  Aged Out    Health Maintenance  Health Maintenance Due  Topic Date Due   DTaP/Tdap/Td (1 - Tdap) Never done   Zoster Vaccines- Shingrix (1 of 2) Never done   DEXA SCAN  Never done   COVID-19 Vaccine (3 - Pfizer risk series) 04/26/2020    Colorectal cancer  screening: Type of screening: Colonoscopy. Completed 04/02/2020. Repeat every 7 years  Mammogram status: Completed 02/12/2022. Repeat every year  Bone Density status: Ordered 01/01/2022. Pt provided with contact info and advised to call to schedule appt. - Patient will call to schedule.  Lung Cancer Screening: (Low Dose CT Chest recommended if Age 76-80 years, 20 pack-year currently smoking OR have quit w/in 15years.) does not qualify.   Lung Cancer Screening Referral:  no  Additional Screening:  Hepatitis C Screening: does qualify; Completed 12/26/2014  Vision Screening: Recommended annual ophthalmology exams for early detection of glaucoma and other disorders of the eye. Is the patient up to date with their annual eye exam?  Yes  Who is the provider or what is the name of the office in which the patient attends annual eye exams? [facility] If pt is not established with a provider, would they like to be referred to a provider to establish care? No .   Dental Screening: Recommended annual dental exams for proper oral hygiene  Diabetic Foot Exam: N/A  Community Resource Referral / Chronic Care Management: CRR required this visit?  No   CCM required this visit?  No     Plan:     I have personally reviewed and noted the following in the patient's chart:   Medical and social history Use of alcohol, tobacco or illicit drugs  Current medications and supplements including opioid prescriptions. Patient is not currently taking opioid prescriptions. Functional ability and status Nutritional status Physical activity Advanced directives List of other physicians Hospitalizations, surgeries, and ER visits in previous 12 months Vitals Screenings to include cognitive, depression, and falls Referrals and appointments  In addition, I have reviewed and discussed with patient certain preventive protocols, quality metrics, and best practice recommendations. A written personalized care plan for preventive services as well as general preventive health recommendations were provided to patient.     Mickeal Needy, LPN   [id number]   After Visit Summary: (Mail) Due to this being a telephonic visit, the after visit summary with patients personalized plan was offered to patient via mail   Nurse Notes: Normal cognitive status assessed by direct observation via telephone conversation by this Nurse Health Advisor. No abnormalities  found.

## 2022-12-14 NOTE — Patient Instructions (Signed)
Ms. Katrina [name] , Thank you for taking time to come for your Medicare Wellness Visit. I appreciate your ongoing commitment to your health goals. Please review the following plan we discussed and let me know if I can assist you in the future.   Referrals/Orders/Follow-Ups/Clinician Recommendations: No  This is a list of the screening recommended for you and due dates:  Health Maintenance  Topic Date Due   DTaP/Tdap/Td vaccine (1 - Tdap) Never done   Zoster (Shingles) Vaccine (1 of 2) Never done   DEXA scan (bone density measurement)  Never done   COVID-19 Vaccine (3 - Pfizer risk series) 04/26/2020   Pneumonia Vaccine (1 of 1 - PCV) 01/01/2023*   Flu Shot  07/18/2023*   Eye exam for diabetics  03/17/2023   Complete foot exam   04/24/2023   Hemoglobin A1C  06/12/2023   Yearly kidney health urinalysis for diabetes  09/07/2023   Yearly kidney function blood test for diabetes  12/10/2023   Medicare Annual Wellness Visit  12/14/2023   Mammogram  02/13/2024   Colon Cancer Screening  04/03/2027   Hepatitis C Screening  Completed   HPV Vaccine  Aged Out  *Topic was postponed. The date shown is not the original due date.    Advanced directives: (Declined) Advance directive discussed with you today. Even though you declined this today, please call our office should you change your mind, and we can give you the proper paperwork for you to fill out.  Next Medicare Annual Wellness Visit scheduled for next year: Yes  Preventive Care attachment FALL PREVENTION attachment  Bone Density attachment

## 2022-12-28 DIAGNOSIS — R350 Frequency of micturition: Secondary | ICD-10-CM | POA: Diagnosis not present

## 2022-12-28 DIAGNOSIS — Z01419 Encounter for gynecological examination (general) (routine) without abnormal findings: Secondary | ICD-10-CM | POA: Diagnosis not present

## 2022-12-29 ENCOUNTER — Encounter: Payer: Self-pay | Admitting: Internal Medicine

## 2022-12-29 ENCOUNTER — Ambulatory Visit (INDEPENDENT_AMBULATORY_CARE_PROVIDER_SITE_OTHER): Payer: PPO | Admitting: Internal Medicine

## 2022-12-29 VITALS — BP 140/86 | HR 68 | Temp 98.3°F | Ht 62.5 in | Wt 194.2 lb

## 2022-12-29 DIAGNOSIS — N3281 Overactive bladder: Secondary | ICD-10-CM | POA: Diagnosis not present

## 2022-12-29 DIAGNOSIS — I10 Essential (primary) hypertension: Secondary | ICD-10-CM | POA: Diagnosis not present

## 2022-12-29 DIAGNOSIS — M79605 Pain in left leg: Secondary | ICD-10-CM | POA: Diagnosis not present

## 2022-12-29 DIAGNOSIS — M79604 Pain in right leg: Secondary | ICD-10-CM

## 2022-12-29 MED ORDER — PREDNISONE 20 MG PO TABS: 40.0000 mg | ORAL_TABLET | Freq: Every day | ORAL | 0 refills | Status: DC

## 2022-12-29 MED ORDER — OXYBUTYNIN CHLORIDE ER 5 MG PO TB24: 5.0000 mg | ORAL_TABLET | Freq: Every day | ORAL | 3 refills | Status: DC

## 2022-12-29 NOTE — Assessment & Plan Note (Signed)
She has stopped losartan for 4 days or so to see if this helped her urination and legs and this did not. Her BP is elevated. Asked her to resume losartan 100 mg daily and she will do so.

## 2022-12-29 NOTE — Patient Instructions (Signed)
We have sent in prednisone to take 2 pills daily for 5 days.   We will send in oxybutynin to take daily (evening or morning) to help with urination problem.

## 2022-12-29 NOTE — Progress Notes (Signed)
   Subjective:   Patient ID: ERI FLOW, female    DOB: [birth year], 68 y.o.   MRN: [id number]  Urinary Tract Infection  Associated symptoms include frequency and urgency. Pertinent negatives include no nausea or vomiting.  Leg Pain    The patient is a 67 YO female coming in for concerns about ongoing problems new in last month.   Review of Systems  Constitutional: Negative.   HENT: Negative.    Eyes: Negative.   Respiratory:  Negative for cough, chest tightness and shortness of breath.   Cardiovascular:  Negative for chest pain, palpitations and leg swelling.  Gastrointestinal:  Negative for abdominal distention, abdominal pain, constipation, diarrhea, nausea and vomiting.  Genitourinary:  Positive for frequency and urgency.  Musculoskeletal:  Positive for arthralgias and myalgias.  Skin: Negative.   Neurological: Negative.   Psychiatric/Behavioral: Negative.      Objective:  Physical Exam Constitutional:      Appearance: She is well-developed.  HENT:     Head: Normocephalic and atraumatic.  Cardiovascular:     Rate and Rhythm: Normal rate and regular rhythm.  Pulmonary:     Effort: Pulmonary effort is normal. No respiratory distress.     Breath sounds: Normal breath sounds. No wheezing or rales.  Abdominal:     General: Bowel sounds are normal. There is no distension.     Palpations: Abdomen is soft.     Tenderness: There is no abdominal tenderness. There is no rebound.  Musculoskeletal:        General: Tenderness present.     Cervical back: Normal range of motion.  Skin:    General: Skin is warm and dry.  Neurological:     Mental Status: She is alert and oriented to person, place, and time.     Coordination: Coordination normal.     Vitals:   12/29/22 1103 12/29/22 1109  BP: (!) 140/86 (!) 140/86  Pulse: 68   Temp: 98.3 F (36.8 C)   TempSrc: Oral   SpO2: 95%   Weight: 194 lb 3.2 oz (88.1 kg)   Height: 5' 2.5" (1.588 m)     Assessment & Plan:

## 2022-12-29 NOTE — Assessment & Plan Note (Signed)
Pain is more anterior on the leg. She has had labs without etiology. No known injury. Stands a lot and feels this triggered. Rx prednisone 5 day course to see if this helps.

## 2022-12-29 NOTE — Assessment & Plan Note (Signed)
Recent urine culture negative and also negative at gyn yesterday. Suspect OAB multifactorial. She has new constipation from ozempic asked her to work on this and abdominal adiposity which contributes. Rx oxybutynin 5 mg at bedtime to see if this helps.

## 2023-01-10 ENCOUNTER — Other Ambulatory Visit: Payer: Self-pay | Admitting: Internal Medicine

## 2023-01-11 ENCOUNTER — Encounter: Payer: Self-pay | Admitting: Pharmacist

## 2023-01-11 NOTE — Progress Notes (Signed)
Pharmacy Quality Measure Review  This patient is appearing on a report for being at risk of failing the adherence measure for hypertension (ACEi/ARB) medications this calendar year.   Medication: losartan 100 mg daily Last fill date: 11/08/2022 for 90 day supply  Insurance report was not up to date. No action needed at this time.   Arbutus Leas, PharmD, BCPS [facility] [phone]

## 2023-01-17 ENCOUNTER — Encounter: Payer: Self-pay | Admitting: Family Medicine

## 2023-01-17 ENCOUNTER — Ambulatory Visit (INDEPENDENT_AMBULATORY_CARE_PROVIDER_SITE_OTHER): Payer: PPO | Admitting: Family Medicine

## 2023-01-17 VITALS — BP 138/84 | HR 80 | Temp 98.0°F | Resp 20 | Ht 62.5 in | Wt 194.0 lb

## 2023-01-17 DIAGNOSIS — J0140 Acute pansinusitis, unspecified: Secondary | ICD-10-CM | POA: Diagnosis not present

## 2023-01-17 MED ORDER — AZITHROMYCIN 250 MG PO TABS
ORAL_TABLET | ORAL | 0 refills | Status: AC
Start: 2023-01-17 — End: 2023-01-22

## 2023-01-17 NOTE — Progress Notes (Signed)
Assessment & Plan:  1. Acute non-recurrent pansinusitis Education provided on sinus infections.  Encouraged symptom management including throat lozenges, chloraseptic spray, warm salt water gargles, hot tea/honey, cough syrup (Delsym), Tylenol/Ibuprofen, Vicks, and a humidifier at night.  May continue Advil Cold & Flu and Robitussin. - azithromycin (ZITHROMAX) 250 MG tablet; Take 2 tablets (500 mg total) by mouth daily for 1 day, THEN 1 tablet (250 mg total) daily for 4 days.  Dispense: 6 each; Refill: 0  No results found for any visits on 01/17/23.  Follow up plan: Return if symptoms worsen or fail to improve.  Deliah Boston, MSN, APRN, FNP-C  Subjective:  HPI: Katrina [name] is a 67 y.o. female presenting on 01/17/2023 for URI (Started last Tuesday: cough, ST, congestion - some better now: now S/S include: continued cough (worse at night) and HA - pressure )  Patient complains of cough, head congestion, headache, sore throat, facial pain/pressure, shortness of breath, and wheezing.  Symptoms are worse at night.  She denies fever. Onset of symptoms was 6 days ago, gradually worsening since that time. She is drinking plenty of fluids. Evaluation to date: at home COVID test negative. Treatment to date:  Advil Cold & Flu, Robitussin, prescription cough syrup . She has a history of asthma and sarcoidosis. She does not smoke.    ROS: Negative unless specifically indicated above in HPI.   Relevant past medical history reviewed and updated as indicated.   Allergies and medications reviewed and updated.   Current Outpatient Medications:    albuterol (VENTOLIN HFA) 108 (90 Base) MCG/ACT inhaler, INHALE 1 TO 2 PUFFS INTO THE LUNGS EVERY 6 HOURS AS NEEDED FOR WHEEZING OR SHORTNESS OF BREATH, Disp: 6.7 g, Rfl: 2   ibuprofen (ADVIL) 200 MG tablet, Take 200 mg by mouth every 6 (six) hours as needed for headache or mild pain., Disp: , Rfl:    levothyroxine (SYNTHROID) 175 MCG tablet, TAKE 1  TABLET(175 MCG) BY MOUTH DAILY, Disp: 90 tablet, Rfl: 0   losartan (COZAAR) 100 MG tablet, Take 1 tablet (100 mg total) by mouth daily., Disp: 90 tablet, Rfl: 3   mupirocin ointment (BACTROBAN) 2 %, Apply 1 application topically daily. (Patient taking differently: Apply 1 application  topically as needed.), Disp: 22 g, Rfl: 0   omeprazole (PRILOSEC) 20 MG capsule, Take 20 mg by mouth daily., Disp: , Rfl:    Semaglutide, 2 MG/DOSE, 8 MG/3ML SOPN, Inject 2 mg as directed once a week., Disp: 3 mL, Rfl: 5   fluocinonide-emollient (LIDEX-E) 0.05 % cream, Apply 1 Application topically 2 (two) times daily. (Patient not taking: Reported on 01/17/2023), Disp: 60 g, Rfl: 0   oxybutynin (DITROPAN-XL) 5 MG 24 hr tablet, Take 1 tablet (5 mg total) by mouth at bedtime. (Patient not taking: Reported on 01/17/2023), Disp: 30 tablet, Rfl: 3   triamcinolone cream (KENALOG) 0.1 %, Apply 1 Application topically 2 (two) times daily. (Patient not taking: Reported on 01/17/2023), Disp: 30 g, Rfl: 0  Allergies  Allergen Reactions   Penicillins Shortness Of Breath and Rash    DID THE REACTION INVOLVE: Swelling of the face/tongue/throat, SOB, or low BP? N Sudden or severe rash/hives, skin peeling, or the inside of the mouth or nose? Yes Did it require medical treatment? No When did it last happen?   2010  If all above answers are "NO", may proceed with cephalosporin use.    Amlodipine     Hives Other reaction(s): Other (See Comments) Hives   Lisinopril  Weakness/ muscle aches   Metformin And Related     Elevated heart rate   Bactrim Ds [Sulfamethoxazole-Trimethoprim] Rash    Upper body and Face redness with heat.      Objective:   BP 138/84   Pulse 80   Temp 98 F (36.7 C)   Resp 20   Ht 5' 2.5" (1.588 m)   Wt 194 lb (88 kg)   SpO2 93%   BMI 34.92 kg/m    Physical Exam Vitals reviewed.  Constitutional:      General: She is not in acute distress.    Appearance: Normal appearance. She is not  ill-appearing, toxic-appearing or diaphoretic.  HENT:     Head: Normocephalic and atraumatic.     Right Ear: Tympanic membrane, ear canal and external ear normal. There is no impacted cerumen.     Left Ear: Tympanic membrane, ear canal and external ear normal. There is no impacted cerumen.     Nose: No congestion or rhinorrhea.     Right Sinus: Maxillary sinus tenderness and frontal sinus tenderness present.     Left Sinus: Maxillary sinus tenderness and frontal sinus tenderness present.     Mouth/Throat:     Mouth: Mucous membranes are moist.     Pharynx: Oropharynx is clear. Posterior oropharyngeal erythema present. No oropharyngeal exudate.  Eyes:     General: No scleral icterus.       Right eye: No discharge.        Left eye: No discharge.     Conjunctiva/sclera: Conjunctivae normal.  Cardiovascular:     Rate and Rhythm: Normal rate and regular rhythm.     Heart sounds: Normal heart sounds. No murmur heard.    No friction rub. No gallop.  Pulmonary:     Effort: Pulmonary effort is normal. No respiratory distress.     Breath sounds: Normal breath sounds. No stridor. No wheezing, rhonchi or rales.  Musculoskeletal:        General: Normal range of motion.     Cervical back: Normal range of motion.  Lymphadenopathy:     Cervical: Cervical adenopathy present.  Skin:    General: Skin is warm and dry.     Capillary Refill: Capillary refill takes less than 2 seconds.  Neurological:     General: No focal deficit present.     Mental Status: She is alert and oriented to person, place, and time. Mental status is at baseline.  Psychiatric:        Mood and Affect: Mood normal.        Behavior: Behavior normal.        Thought Content: Thought content normal.        Judgment: Judgment normal.

## 2023-01-17 NOTE — Patient Instructions (Signed)
Throat lozenges, chloraseptic spray, warm salt water gargles, hot tea/honey, cough syrup (Delsym), Tylenol/Ibuprofen, Vicks, and a humidifier at night.  

## 2023-01-18 ENCOUNTER — Telehealth: Payer: Self-pay | Admitting: Pharmacist

## 2023-01-18 NOTE — Progress Notes (Signed)
Pharmacy Quality Measure Review  This patient is appearing on a report for being at risk of failing the adherence measure for diabetes medications this calendar year.   Medication: ozempic 2 mg/dose weekly Last fill date: 01/17/2023 for 28 day supply  Spoke to patient by phone - she states she just picked up her Ozempic today. She got behind on her injection schedule because she had some stomach upset she waited to resolve so she was a few days late on her injection.  She notes some constipation that she attributes her occasional stomach pain to. She has been focusing on drinking water. Encouraged water and fiber intake. Can use miralax or stool softener if needed as well.  She also notes she is now in the donut hole/coverage gap. Her copay was previously $47 for 28 DS and this time it was ~$250 for 28 DS. Reviewed eligibility criteria for Ozempic PAP and patient is above the income cut off. She states she will continue with the donut hole price for now.  No action needed at this time.    Arbutus Leas, PharmD, BCPS [facility] [phone]

## 2023-01-18 NOTE — Telephone Encounter (Signed)
Pt just called and wanted to get a call back on this manner.

## 2023-02-11 ENCOUNTER — Telehealth: Payer: Self-pay | Admitting: Internal Medicine

## 2023-02-11 ENCOUNTER — Other Ambulatory Visit: Payer: Self-pay | Admitting: Internal Medicine

## 2023-02-11 NOTE — Telephone Encounter (Signed)
This was put in as a telephone note

## 2023-02-11 NOTE — Telephone Encounter (Signed)
Patient states she lost her levothyroxine (SYNTHROID) 175 MCG tablet . Pharmacy informed her that she is out of refills, she would like a refill sent in for her, states she has not taken it all week hoping she would find it. She would like it sent to the [facility] on file at [facility]. States if there are any issues to call her at [phone].

## 2023-02-11 NOTE — Telephone Encounter (Signed)
OK to refill for patient

## 2023-02-11 NOTE — Telephone Encounter (Signed)
This already done in Rx refill

## 2023-02-22 ENCOUNTER — Ambulatory Visit: Payer: PPO | Admitting: Internal Medicine

## 2023-02-22 ENCOUNTER — Encounter: Payer: Self-pay | Admitting: Internal Medicine

## 2023-02-22 VITALS — BP 160/102 | HR 67 | Temp 98.3°F | Ht 62.5 in | Wt 192.0 lb

## 2023-02-22 DIAGNOSIS — I10 Essential (primary) hypertension: Secondary | ICD-10-CM | POA: Diagnosis not present

## 2023-02-22 DIAGNOSIS — M79605 Pain in left leg: Secondary | ICD-10-CM

## 2023-02-22 DIAGNOSIS — M545 Low back pain, unspecified: Secondary | ICD-10-CM

## 2023-02-22 MED ORDER — LOSARTAN POTASSIUM 100 MG PO TABS: 100.0000 mg | ORAL_TABLET | Freq: Every day | ORAL | Status: DC

## 2023-02-22 MED ORDER — PREDNISONE 20 MG PO TABS: 40.0000 mg | ORAL_TABLET | Freq: Every day | ORAL | 0 refills | Status: AC

## 2023-02-22 NOTE — Assessment & Plan Note (Signed)
Chronic Blood pressure not controlled here today She has not been taking the losartan-does increased urination Discussed possibly changing it to something different She would like to retry losartan-restart losartan 100 mg daily Monitor BP

## 2023-02-22 NOTE — Patient Instructions (Addendum)
       Medications changes include :   prednisone 40 mg daily x 5 days.  Stop advil.   Restart losartan daily    Considering following up with sports medicine.     Return if symptoms worsen or fail to improve.

## 2023-02-22 NOTE — Progress Notes (Signed)
Subjective:    Patient ID: Katrina [name], female    DOB: 1955-08-24, 67 y.o.   MRN: [id number]      HPI Katrina [name] is here for  Chief Complaint  Patient presents with   Back Pain    PT notes of lower back pain/shoulder pain when vacuuming x2 weeks. PT currently treating with advil and tylenol. States that hot water helps as well. PT has had past issues with SI joint. PT states they are wanting to try to avoid taking the advil due to GI issues     Back pain - injured it 2 weeks ago vacuuming.  It got better but it is got worse again.  She has b/l lower back pain at SI joints and it radiates to the hips.  No pain in the legs.  Has chronic left lower leg pain -the pain is located in the proximal anterior-lateral aspect of the leg.  It is tender when she pushes in on that area.  The pain is that in the evening - sitting with leg propped up is the only thing that helps.  The pain does not bother her during the day.  It is painful at night when she is sleeping in bed.   Taking a lot of advil -- having stomach pain.  Did not take it yesterday.    Blood pressure is elevated-she has not been taking losartan.  Typically that medication works well for her.  She states it does increase her urination and stopping it helps to that.  Medications and allergies reviewed with patient and updated if appropriate.  Current Outpatient Medications on File Prior to Visit  Medication Sig Dispense Refill   albuterol (VENTOLIN HFA) 108 (90 Base) MCG/ACT inhaler INHALE 1 TO 2 PUFFS INTO THE LUNGS EVERY 6 HOURS AS NEEDED FOR WHEEZING OR SHORTNESS OF BREATH 6.7 g 2   ibuprofen (ADVIL) 200 MG tablet Take 200 mg by mouth every 6 (six) hours as needed for headache or mild pain.     levothyroxine (SYNTHROID) 175 MCG tablet TAKE 1 TABLET(175 MCG) BY MOUTH DAILY 90 tablet 0   mupirocin ointment (BACTROBAN) 2 % Apply 1 application topically daily. (Patient taking differently: Apply 1 application  topically as needed.)  22 g 0   omeprazole (PRILOSEC) 20 MG capsule Take 20 mg by mouth daily.     Semaglutide, 2 MG/DOSE, 8 MG/3ML SOPN Inject 2 mg as directed once a week. 3 mL 5   fluocinonide-emollient (LIDEX-E) 0.05 % cream Apply 1 Application topically 2 (two) times daily. (Patient not taking: Reported on 01/17/2023) 60 g 0   losartan (COZAAR) 100 MG tablet Take 1 tablet (100 mg total) by mouth daily. (Patient not taking: Reported on 02/22/2023) 90 tablet 3   No current facility-administered medications on file prior to visit.    Review of Systems  Musculoskeletal:  Positive for back pain.       No muscle spasms  Neurological:  Positive for numbness (in right foot - has had that a while). Negative for weakness.       Objective:   Vitals:   02/22/23 0956  BP: (!) 155/102  Pulse: 67  Temp: 98.3 F (36.8 C)  SpO2: 96%   BP Readings from Last 3 Encounters:  02/22/23 (!) 155/102  01/17/23 138/84  12/29/22 (!) 140/86   Wt Readings from Last 3 Encounters:  02/22/23 192 lb (87.1 kg)  01/17/23 194 lb (88 kg)  12/29/22 194 lb 3.2 oz (88.1 kg)  Body mass index is 34.56 kg/m.    Physical Exam Constitutional:      General: She is not in acute distress.    Appearance: Normal appearance. She is not ill-appearing.  HENT:     Head: Normocephalic and atraumatic.  Musculoskeletal:        General: No swelling or deformity.     Comments: Tenderness with palpation central lower back and bilateral sides of lower back.  Tenderness with palpation proximal left lower leg-anterior-lateral aspect  Skin:    General: Skin is warm and dry.     Findings: No rash.  Neurological:     Mental Status: She is alert.     Sensory: No sensory deficit.     Motor: No weakness.            Assessment & Plan:    See Problem List for Assessment and Plan of chronic medical problems.    Lower back pain, left lower leg pain: Acute Started 2 weeks ago after vacuuming Pain across lower back which is tender  without radiation, but does have chronic left lower back pain has been going on for a little over a year that sounds like it could be related to radiculopathy Start prednisone 40 mg daily x 5 days She does have gabapentin at home-discussed this may help with her left lower leg pain-discussed possible side effects which she is concerned about Discussed that she can try this and use short-term depending on her pain to see if that helps Discussed seeing sports medicine again since she has intermittent lower back pain

## 2023-03-21 ENCOUNTER — Other Ambulatory Visit: Payer: Self-pay

## 2023-03-21 ENCOUNTER — Telehealth: Payer: Self-pay | Admitting: Internal Medicine

## 2023-03-21 MED ORDER — SEMAGLUTIDE (2 MG/DOSE) 8 MG/3ML ~~LOC~~ SOPN: 2.0000 mg | PEN_INJECTOR | SUBCUTANEOUS | 5 refills | Status: DC

## 2023-03-21 NOTE — Telephone Encounter (Signed)
Prescription Request  03/21/2023  LOV: 12/29/2022  What is the name of the medication or equipment? ozempic  Have you contacted your pharmacy to request a refill? Yes   Which pharmacy would you like this sent to?  [id number] - [place], [place] - [address] [facility] [address] [place] [address] Phone: [phone] Fax: [phone]     Patient notified that their request is being sent to the clinical staff for review and that they should receive a response within 2 business days.   Please advise at Mobile [phone] (mobile)

## 2023-03-28 DIAGNOSIS — D1801 Hemangioma of skin and subcutaneous tissue: Secondary | ICD-10-CM | POA: Diagnosis not present

## 2023-03-28 DIAGNOSIS — L820 Inflamed seborrheic keratosis: Secondary | ICD-10-CM | POA: Diagnosis not present

## 2023-03-28 DIAGNOSIS — L814 Other melanin hyperpigmentation: Secondary | ICD-10-CM | POA: Diagnosis not present

## 2023-03-28 DIAGNOSIS — L821 Other seborrheic keratosis: Secondary | ICD-10-CM | POA: Diagnosis not present

## 2023-03-28 DIAGNOSIS — L570 Actinic keratosis: Secondary | ICD-10-CM | POA: Diagnosis not present

## 2023-03-28 DIAGNOSIS — L720 Epidermal cyst: Secondary | ICD-10-CM | POA: Diagnosis not present

## 2023-04-19 ENCOUNTER — Encounter: Payer: Self-pay | Admitting: Internal Medicine

## 2023-04-19 ENCOUNTER — Telehealth: Payer: Self-pay

## 2023-04-19 ENCOUNTER — Other Ambulatory Visit (HOSPITAL_COMMUNITY): Payer: Self-pay

## 2023-04-19 ENCOUNTER — Ambulatory Visit (INDEPENDENT_AMBULATORY_CARE_PROVIDER_SITE_OTHER): Payer: PPO | Admitting: Internal Medicine

## 2023-04-19 VITALS — BP 124/80 | HR 64 | Temp 98.8°F | Ht 62.5 in | Wt 192.0 lb

## 2023-04-19 DIAGNOSIS — M25512 Pain in left shoulder: Secondary | ICD-10-CM

## 2023-04-19 DIAGNOSIS — E118 Type 2 diabetes mellitus with unspecified complications: Secondary | ICD-10-CM | POA: Diagnosis not present

## 2023-04-19 DIAGNOSIS — Z7985 Long-term (current) use of injectable non-insulin antidiabetic drugs: Secondary | ICD-10-CM | POA: Diagnosis not present

## 2023-04-19 DIAGNOSIS — M25519 Pain in unspecified shoulder: Secondary | ICD-10-CM | POA: Insufficient documentation

## 2023-04-19 DIAGNOSIS — M25511 Pain in right shoulder: Secondary | ICD-10-CM

## 2023-04-19 MED ORDER — TIRZEPATIDE 12.5 MG/0.5ML ~~LOC~~ SOAJ: 12.5000 mg | SUBCUTANEOUS | 0 refills | Status: DC

## 2023-04-19 MED ORDER — CYCLOBENZAPRINE HCL 5 MG PO TABS: 5.0000 mg | ORAL_TABLET | Freq: Three times a day (TID) | ORAL | 1 refills | Status: DC | PRN

## 2023-04-19 MED ORDER — PREDNISONE 20 MG PO TABS: 40.0000 mg | ORAL_TABLET | Freq: Every day | ORAL | 0 refills | Status: DC

## 2023-04-19 MED ORDER — TIRZEPATIDE 10 MG/0.5ML ~~LOC~~ SOAJ: 10.0000 mg | SUBCUTANEOUS | 0 refills | Status: DC

## 2023-04-19 MED ORDER — TIRZEPATIDE 15 MG/0.5ML ~~LOC~~ SOAJ: 15.0000 mg | SUBCUTANEOUS | 3 refills | Status: DC

## 2023-04-19 NOTE — Telephone Encounter (Signed)
 Pharmacy Patient Advocate Encounter  Received notification from [employer] that Prior Authorization for MOUNJARO  10MG  has been APPROVED from 04/19/23 to 04/18/24. Ran test claim, Copay is $256.32. This test claim was processed through [facility]- copay amounts may vary at other pharmacies due to pharmacy/plan contracts, or as the patient moves through the different stages of their insurance plan.   PA #/Case ID/Reference #: [id number]

## 2023-04-19 NOTE — Patient Instructions (Signed)
We have sent in prednisone to take 2 pills daily for 5 days.  We have also sent in the muscle relaxer flexeril to use up to 3 times a day.  It is okay to use heat, tylenol and ibuprofen.

## 2023-04-19 NOTE — Assessment & Plan Note (Signed)
Rx prednisone and flexeril to help with pain. Okay to do ibuprofen and tylenol otc as well.

## 2023-04-19 NOTE — Assessment & Plan Note (Signed)
Wants to switch to [facility] as available now. Rx 10 mg for month 1, then 12.5 mg weekly for month 2, then 15 mg weekly ongoing.

## 2023-04-19 NOTE — Progress Notes (Signed)
   Subjective:   Patient ID: Katrina [name], female    DOB: [birth year], 67 y.o.   MRN: [id number]  HPI The patient is a 67 YO female coming in for shoulder pain and neck pain after vacuuming and moving furniture. About 2 weeks ago started and about 20% improved. Using tylenol  and ibuprofen and heat and stretching exercises.   Review of Systems  Constitutional: Negative.   HENT: Negative.    Eyes: Negative.   Respiratory:  Negative for cough, chest tightness and shortness of breath.   Cardiovascular:  Negative for chest pain, palpitations and leg swelling.  Gastrointestinal:  Negative for abdominal distention, abdominal pain, constipation, diarrhea, nausea and vomiting.  Musculoskeletal:  Positive for arthralgias, myalgias and neck pain.  Skin: Negative.   Neurological: Negative.   Psychiatric/Behavioral: Negative.      Objective:  Physical Exam Constitutional:      Appearance: She is well-developed.  HENT:     Head: Normocephalic and atraumatic.  Cardiovascular:     Rate and Rhythm: Normal rate and regular rhythm.  Pulmonary:     Effort: Pulmonary effort is normal. No respiratory distress.     Breath sounds: Normal breath sounds. No wheezing or rales.  Abdominal:     General: Bowel sounds are normal. There is no distension.     Palpations: Abdomen is soft.     Tenderness: There is no abdominal tenderness. There is no rebound.  Musculoskeletal:        General: Tenderness present.     Cervical back: Normal range of motion.     Comments: ROM normal both shoulders with pain at the [facility] joints, pain in the upper back muscles bilaterally, no tenderness to cervical spine  Skin:    General: Skin is warm and dry.  Neurological:     Mental Status: She is alert and oriented to person, place, and time.     Coordination: Coordination normal.     Vitals:   04/19/23 0959  BP: 124/80  Pulse: 64  Temp: 98.8 F (37.1 C)  TempSrc: Oral  SpO2: 97%  Weight: 192 lb (87.1 kg)  Height:  5' 2.5 (1.588 m)    Assessment & Plan:

## 2023-04-19 NOTE — Telephone Encounter (Signed)
 Pharmacy Patient Advocate Encounter   Received notification from CoverMyMeds that prior authorization for MOUNJARO  10MG  is required/requested.   Insurance verification completed.   The patient is insured through [facility] [employer]/RX ADVANCE .   Per test claim: PA required; PA submitted to above mentioned insurance via CoverMyMeds Key/confirmation #/EOC [id number] Status is pending

## 2023-04-19 NOTE — Telephone Encounter (Signed)
 Copied from CRM [id number]. Topic: Clinical - Prescription Issue >> Apr 19, 2023 11:25 AM Mercedes MATSU wrote: Reason for CRM: Reason for CRM: Patient called in stating that her pharmacy is requesting a prior authorization from provider for recently prescribed medication.

## 2023-05-05 DIAGNOSIS — R7309 Other abnormal glucose: Secondary | ICD-10-CM | POA: Diagnosis not present

## 2023-05-05 DIAGNOSIS — H524 Presbyopia: Secondary | ICD-10-CM | POA: Diagnosis not present

## 2023-05-05 DIAGNOSIS — D3132 Benign neoplasm of left choroid: Secondary | ICD-10-CM | POA: Diagnosis not present

## 2023-05-05 DIAGNOSIS — H25813 Combined forms of age-related cataract, bilateral: Secondary | ICD-10-CM | POA: Diagnosis not present

## 2023-05-05 LAB — HM DIABETES EYE EXAM

## 2023-05-09 ENCOUNTER — Other Ambulatory Visit: Payer: Self-pay | Admitting: Internal Medicine

## 2023-05-17 ENCOUNTER — Other Ambulatory Visit: Payer: Self-pay | Admitting: Internal Medicine

## 2023-05-17 DIAGNOSIS — Z1231 Encounter for screening mammogram for malignant neoplasm of breast: Secondary | ICD-10-CM

## 2023-05-18 ENCOUNTER — Ambulatory Visit: Payer: PPO

## 2023-05-25 ENCOUNTER — Ambulatory Visit
Admission: RE | Admit: 2023-05-25 | Discharge: 2023-05-25 | Disposition: A | Discharge status: Home / Self Care | Payer: PPO | Source: Ambulatory Visit | Attending: Internal Medicine | Admitting: Internal Medicine

## 2023-05-25 DIAGNOSIS — Z1231 Encounter for screening mammogram for malignant neoplasm of breast: Secondary | ICD-10-CM | POA: Diagnosis not present

## 2023-05-30 ENCOUNTER — Encounter: Payer: Self-pay | Admitting: Internal Medicine

## 2023-05-31 ENCOUNTER — Encounter: Payer: Self-pay | Admitting: Internal Medicine

## 2023-05-31 ENCOUNTER — Encounter: Payer: Self-pay | Admitting: Family Medicine

## 2023-05-31 ENCOUNTER — Ambulatory Visit: Payer: Self-pay | Admitting: Internal Medicine

## 2023-05-31 ENCOUNTER — Ambulatory Visit: Payer: PPO | Admitting: Family Medicine

## 2023-05-31 ENCOUNTER — Ambulatory Visit (INDEPENDENT_AMBULATORY_CARE_PROVIDER_SITE_OTHER): Payer: PPO

## 2023-05-31 VITALS — BP 128/86 | HR 65 | Temp 97.6°F | Ht 62.5 in | Wt 191.0 lb

## 2023-05-31 DIAGNOSIS — J22 Unspecified acute lower respiratory infection: Secondary | ICD-10-CM

## 2023-05-31 DIAGNOSIS — J029 Acute pharyngitis, unspecified: Secondary | ICD-10-CM

## 2023-05-31 DIAGNOSIS — R051 Acute cough: Secondary | ICD-10-CM

## 2023-05-31 DIAGNOSIS — H9201 Otalgia, right ear: Secondary | ICD-10-CM

## 2023-05-31 DIAGNOSIS — R0981 Nasal congestion: Secondary | ICD-10-CM | POA: Diagnosis not present

## 2023-05-31 DIAGNOSIS — I517 Cardiomegaly: Secondary | ICD-10-CM | POA: Diagnosis not present

## 2023-05-31 DIAGNOSIS — Z8701 Personal history of pneumonia (recurrent): Secondary | ICD-10-CM

## 2023-05-31 DIAGNOSIS — R058 Other specified cough: Secondary | ICD-10-CM | POA: Diagnosis not present

## 2023-05-31 DIAGNOSIS — R0602 Shortness of breath: Secondary | ICD-10-CM

## 2023-05-31 DIAGNOSIS — D860 Sarcoidosis of lung: Secondary | ICD-10-CM | POA: Diagnosis not present

## 2023-05-31 LAB — POCT INFLUENZA A/B
Influenza A, POC: NEGATIVE
Influenza B, POC: NEGATIVE

## 2023-05-31 LAB — POC COVID19 BINAXNOW: SARS Coronavirus 2 Ag: NEGATIVE

## 2023-05-31 LAB — HM MAMMOGRAPHY

## 2023-05-31 MED ORDER — DOXYCYCLINE HYCLATE 100 MG PO TABS: 100.0000 mg | ORAL_TABLET | Freq: Two times a day (BID) | ORAL | 0 refills | Status: DC

## 2023-05-31 NOTE — Telephone Encounter (Signed)
Chief Complaint: SOB, asthma Symptoms: productive cough, SOB only at night with hx of asthma, bilateral ear pain, congestion Frequency: Since Saturday PM Pertinent Negatives: Patient denies CP, SOB at rest/during the day, fever Disposition: [] ED /[] Urgent Care (no appt availability in office) / [x] Appointment(In office/virtual)/ []  [facility]/ [] Home Care/ [] Refused Recommended Disposition /[] [facility]/ []  Follow-up with PCP Additional Notes: Pt reports cough, sore throat, congestion, ear pain, and SOB only at nighttime since Saturday PM. Hx of asthma. States she has had to use her inhaler at nighttime because she feels SOB w/ wheezing. Does not need inhaler during the day. No SOB during the day. No fever. No aches. No N/V/D. Per protocol, scheduled pt in office today at 0940. RN advised pt to call 911 if she becomes SOB or develops CP before then, to which pt verbalized understanding.   Copied from CRM [id number]. Topic: Clinical - Red Word Triage >> May 31, 2023  8:31 AM Adele Barthel wrote: Red Word that prompted transfer to Nurse Triage:   Sore throat started on Saturday. No fever.  Coughing up phlegm. Nasal congestion  Chest congestion  Bilateral ear pain, more so on right. Shortness of breath.  Has been using inhaler and Advil Cold and Flu.  Feels she may have been exposed while out in public this weekend. Reason for Disposition  [1] MILD difficulty breathing (e.g., minimal/no SOB at rest, SOB with walking, pulse <100) AND [2] NEW-onset or WORSE than normal  Answer Assessment - Initial Assessment Questions 1. RESPIRATORY STATUS: "Describe your breathing?" (e.g., wheezing, shortness of breath, unable to speak, severe coughing)      Started on Saturday -- "a little" wheezing (hx of asthma when exposed to something), "when I went to bed on Saturday my throat my scratchy, I couldn't breathe, I had to use my inhaler", "during the day the breathing is better, but at night  I have to use my inhaler"  2. ONSET: "When did this breathing problem begin?"      Saturday night 3. PATTERN "Does the difficult breathing come and go, or has it been constant since it started?"      Worse at night 4. SEVERITY: "How bad is your breathing?" (e.g., mild, moderate, severe)    - MILD: No SOB at rest, mild SOB with walking, speaks normally in sentences, can lie down, no retractions, pulse < 100.    - MODERATE: SOB at rest, SOB with minimal exertion and prefers to sit, cannot lie down flat, speaks in phrases, mild retractions, audible wheezing, pulse 100-120.    - SEVERE: Very SOB at rest, speaks in single words, struggling to breathe, sitting hunched forward, retractions, pulse > 120      "With this, SOB is just at night", states she feels like she has to gasp for air at nighttime 5. RECURRENT SYMPTOM: "Have you had difficulty breathing before?" If Yes, ask: "When was the last time?" and "What happened that time?"      Yes, hx of asthma, "I've had this before - I probably haven't had to use my inhaler for 3 or 4 months, usually burning candles trigger my asthma" 6. CARDIAC HISTORY: "Do you have any history of heart disease?" (e.g., heart attack, angina, bypass surgery, angioplasty)      HTN, takes BP meds 7. LUNG HISTORY: "Do you have any history of lung disease?"  (e.g., pulmonary embolus, asthma, emphysema)     Pulmonary sarcoidosis, asthma 8. CAUSE: "What do you think is  causing the breathing problem?"      Whatever sickness she has caught 9. OTHER SYMPTOMS: "Do you have any other symptoms? (e.g., dizziness, runny nose, cough, chest pain, fever)     Cough with clear mucus and "tinge of blood" "bright red, streaky" (has coughed up blood before), congestion, bilateral ear pain worse on R side - "every year without fail I get pneumonia, I don't know why." No fever, no body aches. "I had to go to the drug store Saturday and had to stand in line and one girl behind me was talking about  how she was so, so sick" - thinks she caught something there. 10. O2 SATURATION MONITOR:  "Do you use an oxygen saturation monitor (pulse oximeter) at home?" If Yes, ask: "What is your reading (oxygen level) today?" "What is your usual oxygen saturation reading?" (e.g., 95%)       States she has one, but that she can't find it  Protocols used: Breathing Difficulty-A-AH

## 2023-05-31 NOTE — Patient Instructions (Signed)
Please go downstairs for a chest x-ray before you leave.  Continue over-the-counter cold and cough medication.  I recommend adding Mucinex  Continue using albuterol as needed.  Stay hydrated.  I will be in touch with your results.

## 2023-05-31 NOTE — Progress Notes (Signed)
Subjective:  Katrina [name] is a 68 y.o. female who presents for a 4 day hx of fatigue, nasal congestion, right ear pain ST, productive cough sputum and blood-tinged this morning.  States she had shortness of breath last night laying down.  States she feels like she did when she had pneumonia in the past.  Using albuterol inhaler.  Taking Advil cold and sinus.  Denies fever, chills, body aches, dizziness, chest pain, palpitations, abdominal pain, nausea, vomiting or diarrhea.  Reports having a hemorrhoid that is now becoming more bothersome and has prolapse.  Plans to call her GI.  No rectal bleeding.  ROS as in subjective.   Objective: Vitals:   05/31/23 0939  BP: 128/86  Pulse: 65  Temp: 97.6 F (36.4 C)  SpO2: 97%    General appearance: Alert, WD/WN, no distress, mildly ill appearing                             Skin: warm, no rash                           Head: no sinus tenderness                            Eyes: conjunctiva normal, corneas clear, PERRLA                            Ears: pearly TMs, external ear canals normal                          Nose: septum midline, turbinates swollen, with erythema and clear discharge             Mouth/throat: MMM, tongue normal, mild pharyngeal erythema                           Neck: supple, no adenopathy, no thyromegaly, nontender                          Heart: RRR                         Lungs: CTA bilaterally, no wheezes, rales, or rhonchi      Assessment: Lower respiratory infection - Plan: DG Chest 2 View, doxycycline (VIBRA-TABS) 100 MG tablet  Acute cough - Plan: POC COVID-19 BinaxNow, POCT Influenza A/B  Nasal congestion - Plan: POC COVID-19 BinaxNow, POCT Influenza A/B  Pulmonary sarcoidosis (HCC) - Plan: DG Chest 2 View  History of pneumonia - Plan: DG Chest 2 View  Otalgia of right ear  Acute pharyngitis, unspecified etiology  Shortness of breath - Plan: DG Chest 2 View   Plan: Negative Covid and flu tests.   STAT chest X ray ordered.  Doxycycline prescribed.  Suggested symptomatic OTC remedies. Nasal saline spray for congestion.   Tylenol or Ibuprofen OTC prn

## 2023-06-22 ENCOUNTER — Ambulatory Visit: Payer: Self-pay | Admitting: Internal Medicine

## 2023-06-22 ENCOUNTER — Encounter: Payer: Self-pay | Admitting: Family Medicine

## 2023-06-22 ENCOUNTER — Ambulatory Visit: Admitting: Family Medicine

## 2023-06-22 VITALS — BP 134/86 | HR 73 | Temp 97.7°F | Ht 62.5 in | Wt 188.0 lb

## 2023-06-22 DIAGNOSIS — R051 Acute cough: Secondary | ICD-10-CM | POA: Diagnosis not present

## 2023-06-22 DIAGNOSIS — G44209 Tension-type headache, unspecified, not intractable: Secondary | ICD-10-CM | POA: Diagnosis not present

## 2023-06-22 DIAGNOSIS — J029 Acute pharyngitis, unspecified: Secondary | ICD-10-CM

## 2023-06-22 DIAGNOSIS — Z20818 Contact with and (suspected) exposure to other bacterial communicable diseases: Secondary | ICD-10-CM | POA: Diagnosis not present

## 2023-06-22 DIAGNOSIS — R5383 Other fatigue: Secondary | ICD-10-CM

## 2023-06-22 LAB — POCT INFLUENZA A/B
Influenza A, POC: NEGATIVE
Influenza B, POC: NEGATIVE

## 2023-06-22 LAB — POC COVID19 BINAXNOW: SARS Coronavirus 2 Ag: NEGATIVE

## 2023-06-22 LAB — POCT RAPID STREP A (OFFICE): Rapid Strep A Screen: NEGATIVE

## 2023-06-22 NOTE — Patient Instructions (Addendum)
 You are negative for COVID, flu and strep throat today.  Treat your symptoms with Tylenol or ibuprofen, salt water gargles, throat lozenges, over-the-counter Chloraseptic spray or something similar.  If your throat does not improve by Friday or next Monday, I recommend testing you for strep throat again since you did have an exposure.

## 2023-06-22 NOTE — Progress Notes (Signed)
 Subjective:  Katrina [name] is a 68 y.o. female who presents for a 2 day hx of fatigue, body aches, headache, congestion, cough and ST.  Reports exposure to strep.   Denies fever, chills, dizziness, chest pain, palpitations, shortness of breath, abdominal pain, N/V/D.    ROS as in subjective.   Objective: Vitals:   06/22/23 1126  BP: 134/86  Pulse: 73  Temp: 97.7 F (36.5 C)  SpO2: 98%    General appearance: Alert, WD/WN, no distress, mildly ill appearing                             Skin: warm, no rash                           Head: no sinus tenderness                            Eyes: conjunctiva normal, corneas clear, PERRLA                            Ears: pearly TMs, external ear canals normal                          Nose: septum midline, no discharge              Mouth/throat: MMM, tongue normal, mild pharyngeal erythema, no exudate or edema                           Neck: supple, no adenopathy, no thyromegaly, nontender                          Heart: RRR                         Lungs: CTA bilaterally, no wheezes, rales, or rhonchi      Assessment: Acute pharyngitis, unspecified etiology - Plan: POC COVID-19 BinaxNow, POCT Influenza A/B, POCT rapid strep A  Exposure to strep throat - Plan: POCT rapid strep A  Fatigue, unspecified type - Plan: POC COVID-19 BinaxNow, POCT Influenza A/B, POCT rapid strep A  Acute non intractable tension-type headache - Plan: POC COVID-19 BinaxNow, POCT Influenza A/B  Acute cough - Plan: POC COVID-19 BinaxNow, POCT Influenza A/B, POCT rapid strep A    Plan: Negative for COVID, flu and strep throat Discussed diagnosis and treatment of viral etiology.   Suggested symptomatic OTC remedies. Nasal saline spray for congestion.  Tylenol or Ibuprofen OTC. Salt water gargles.  Call/return in 2-3 days if symptoms are worsening.

## 2023-06-22 NOTE — Telephone Encounter (Signed)
 Copied from CRM [id number]. Topic: Clinical - Red Word Triage >> Jun 22, 2023 10:53 AM Adele Barthel wrote: Red Word that prompted transfer to Nurse Triage:   Son developed strep throat on Monday  Woke up last night with sore throat Throat pain is getting worse Headache   Chief Complaint: Sore throat Symptoms: Sore throat, congestion Pertinent Negatives: Patient denies fever Disposition: [] ED /[] Urgent Care (no appt availability in office) / [x] Appointment(In office/virtual)/ []  [place] [facility]/ [] Home Care/ [] Refused Recommended Disposition /[] [place] [facility]/ []  Follow-up with PCP Additional Notes: Patient stated her son recently tested positive for strep and patient began having a sore throat last night. She is wanting to be tested for strep. She mentioned that she was seen 3 weeks ago had a cold with sore throat, chest and nasal congestion. She was given Doxycycline 10 day treatment and she completed it. Her sore throat went away at the time and now it has returned. Her nasal congestion never went away. Appointment scheduled for this morning.   Reason for Disposition  SEVERE (e.g., excruciating) throat pain  Answer Assessment - Initial Assessment Questions 1. ONSET: "When did the throat start hurting?" (Hours or days ago)      Middle of night last night, worsened this morning  2. SEVERITY: "How bad is the sore throat?" (Scale 1-10; mild, moderate or severe)   - MILD (1-3):  Doesn't interfere with eating or normal activities.   - MODERATE (4-7): Interferes with eating some solids and normal activities.   - SEVERE (8-10):  Excruciating pain, interferes with most normal activities.   - SEVERE WITH DYSPHAGIA (10): Can't swallow liquids, drooling.     8/10  3. STREP EXPOSURE: "Has there been any exposure to strep within the past week?" If Yes, ask: "What type of contact occurred?"      Son tested positive on Monday and patient was around son over the weekend  4.  VIRAL  SYMPTOMS: "Are there any symptoms of a cold, such as a runny nose, cough, hoarse voice or red eyes?"      Congestion  5. FEVER: "Do you have a fever?" If Yes, ask: "What is your temperature, how was it measured, and when did it start?"     No  6. PUS ON THE TONSILS: "Is there pus on the tonsils in the back of your throat?"     No  Protocols used: Sore Throat-A-AH

## 2023-06-24 ENCOUNTER — Telehealth: Payer: Self-pay

## 2023-06-24 ENCOUNTER — Ambulatory Visit

## 2023-06-24 DIAGNOSIS — Z20818 Contact with and (suspected) exposure to other bacterial communicable diseases: Secondary | ICD-10-CM

## 2023-06-24 LAB — POCT RAPID STREP A (OFFICE): Rapid Strep A Screen: NEGATIVE

## 2023-06-24 NOTE — Progress Notes (Signed)
 Patient visits today following up from their visit on 03/05. They are receiving a strep test. The results for the strep test showed negative

## 2023-06-24 NOTE — Telephone Encounter (Signed)
 Called and scheduled NV for strep test

## 2023-06-24 NOTE — Telephone Encounter (Signed)
 Copied from CRM [id number]. Topic: Clinical - Medical Advice >> Jun 24, 2023  8:50 AM Deaijah H wrote: Reason for CRM: Patient was advised if she's not better in 2 days to come back and do strep cultural, patient stated she feels better but throat is still sore and would like to know what to do. Please call [phone]

## 2023-07-05 ENCOUNTER — Other Ambulatory Visit: Payer: Self-pay

## 2023-07-05 ENCOUNTER — Other Ambulatory Visit: Payer: Self-pay | Admitting: Internal Medicine

## 2023-07-12 DIAGNOSIS — Z872 Personal history of diseases of the skin and subcutaneous tissue: Secondary | ICD-10-CM | POA: Diagnosis not present

## 2023-07-12 DIAGNOSIS — L814 Other melanin hyperpigmentation: Secondary | ICD-10-CM | POA: Diagnosis not present

## 2023-07-12 DIAGNOSIS — L308 Other specified dermatitis: Secondary | ICD-10-CM | POA: Diagnosis not present

## 2023-07-12 DIAGNOSIS — L720 Epidermal cyst: Secondary | ICD-10-CM | POA: Diagnosis not present

## 2023-07-12 DIAGNOSIS — Z129 Encounter for screening for malignant neoplasm, site unspecified: Secondary | ICD-10-CM | POA: Diagnosis not present

## 2023-07-12 DIAGNOSIS — D1801 Hemangioma of skin and subcutaneous tissue: Secondary | ICD-10-CM | POA: Diagnosis not present

## 2023-07-12 DIAGNOSIS — L821 Other seborrheic keratosis: Secondary | ICD-10-CM | POA: Diagnosis not present

## 2023-07-14 DIAGNOSIS — L820 Inflamed seborrheic keratosis: Secondary | ICD-10-CM | POA: Diagnosis not present

## 2023-07-14 DIAGNOSIS — L720 Epidermal cyst: Secondary | ICD-10-CM | POA: Diagnosis not present

## 2023-08-03 ENCOUNTER — Ambulatory Visit: Admitting: Internal Medicine

## 2023-08-03 ENCOUNTER — Ambulatory Visit: Payer: Self-pay | Admitting: *Deleted

## 2023-08-03 ENCOUNTER — Encounter: Payer: Self-pay | Admitting: Internal Medicine

## 2023-08-03 VITALS — BP 128/84 | HR 72 | Temp 98.0°F | Ht 62.5 in | Wt 187.0 lb

## 2023-08-03 DIAGNOSIS — R350 Frequency of micturition: Secondary | ICD-10-CM | POA: Diagnosis not present

## 2023-08-03 DIAGNOSIS — R252 Cramp and spasm: Secondary | ICD-10-CM | POA: Diagnosis not present

## 2023-08-03 LAB — CBC WITH DIFFERENTIAL/PLATELET
Basophils Absolute: 0.1 10*3/uL (ref 0.0–0.1)
Basophils Relative: 0.9 % (ref 0.0–3.0)
Eosinophils Absolute: 0.1 10*3/uL (ref 0.0–0.7)
Eosinophils Relative: 1.4 % (ref 0.0–5.0)
HCT: 45.5 % (ref 36.0–46.0)
Hemoglobin: 15 g/dL (ref 12.0–15.0)
Lymphocytes Relative: 19.9 % (ref 12.0–46.0)
Lymphs Abs: 1.2 10*3/uL (ref 0.7–4.0)
MCHC: 32.9 g/dL (ref 30.0–36.0)
MCV: 84.1 fl (ref 78.0–100.0)
Monocytes Absolute: 0.6 10*3/uL (ref 0.1–1.0)
Monocytes Relative: 9.5 % (ref 3.0–12.0)
Neutro Abs: 4 10*3/uL (ref 1.4–7.7)
Neutrophils Relative %: 68.3 % (ref 43.0–77.0)
Platelets: 246 10*3/uL (ref 150.0–400.0)
RBC: 5.41 Mil/uL — ABNORMAL HIGH (ref 3.87–5.11)
RDW: 13.6 % (ref 11.5–15.5)
WBC: 5.9 10*3/uL (ref 4.0–10.5)

## 2023-08-03 LAB — COMPREHENSIVE METABOLIC PANEL WITH GFR
ALT: 19 U/L (ref 0–35)
AST: 18 U/L (ref 0–37)
Albumin: 4.5 g/dL (ref 3.5–5.2)
Alkaline Phosphatase: 86 U/L (ref 39–117)
BUN: 13 mg/dL (ref 6–23)
CO2: 29 meq/L (ref 19–32)
Calcium: 9.6 mg/dL (ref 8.4–10.5)
Chloride: 99 meq/L (ref 96–112)
Creatinine, Ser: 0.66 mg/dL (ref 0.40–1.20)
GFR: 89.52 mL/min (ref >60.00)
Glucose, Bld: 101 mg/dL — ABNORMAL HIGH (ref 70–99)
Potassium: 4.3 meq/L (ref 3.5–5.1)
Sodium: 136 meq/L (ref 135–145)
Total Bilirubin: 0.8 mg/dL (ref 0.2–1.2)
Total Protein: 7.6 g/dL (ref 6.0–8.3)

## 2023-08-03 LAB — VITAMIN B12: Vitamin B-12: 347 pg/mL (ref 211–911)

## 2023-08-03 LAB — MAGNESIUM: Magnesium: 1.9 mg/dL (ref 1.5–2.5)

## 2023-08-03 NOTE — Telephone Encounter (Signed)
 Copied from CRM [id number]. Topic: Clinical - Red Word Triage >> Aug 03, 2023  8:30 AM [place] A wrote: [facility] that prompted transfer to Nurse Triage: Patient called because she has severe leg pain 1-10;10 Reason for Disposition  [1] MODERATE pain (e.g., interferes with normal activities, limping) AND [2] present > 3 days  Answer Assessment - Initial Assessment Questions 1. ONSET: "When did the pain start?"      I'm having pain in both legs in my calves and in the front. 2. LOCATION: "Where is the pain located?"      In my calves and in the front.    I've had it before and been seen for this issue.     It's really bad this time.   It was so painful last night. It has been going on for a couple of weeks. 3. PAIN: "How bad is the pain?"    (Scale 1-10; or mild, moderate, severe)   -  MILD (1-3): doesn't interfere with normal activities    -  MODERATE (4-7): interferes with normal activities (e.g., work or school) or awakens from sleep, limping    -  SEVERE (8-10): excruciating pain, unable to do any normal activities, unable to walk     10/10  4. WORK OR EXERCISE: "Has there been any recent work or exercise that involved this part of the body?"      They were unsure of what is causing this pain when this happened before. 5. CAUSE: "What do you think is causing the leg pain?"     I don't know.   I'm using Advil and Tylenol and it's not really hurting.   It has become chronic. 6. OTHER SYMPTOMS: "Do you have any other symptoms?" (e.g., chest pain, back pain, breathing difficulty, swelling, rash, fever, numbness, weakness)     No shortness of breath.    I have shoulder pain and in my hands.    I've been doing needle work and my hands freeze. 7. PREGNANCY: "Is there any chance you are pregnant?" "When was your last menstrual period?"     NB/A due to age  Protocols used: Leg Pain-A-AH  Chief Complaint: Bilateral leg pain in calves and in front of legs Symptoms: above Frequency: Getting worse  even with Advil and Tylenol.   Has become a chronic pain Pertinent Negatives: Patient denies injuries.     Has had this before but never found out what is was. Disposition: [] ED /[] Urgent Care (no appt availability in office) / [x] Appointment(In office/virtual)/ []  [place] [facility]/ [] Home Care/ [] Refused Recommended Disposition /[] [place] [facility]/ []  Follow-up with PCP Additional Notes: Appt made for today at 11:00 with Dr. Donnette Gal.

## 2023-08-03 NOTE — Progress Notes (Signed)
 Subjective:    Patient ID: Katrina [name], female    DOB: 1955-09-01, 68 y.o.   MRN: [id number]      HPI Nitza is here for  Chief Complaint  Patient presents with   Leg Pain    Bilateral leg and calf pain   Urinary Frequency     Urinary frequency- ? From losartan.  For a while she has been experiencing increased urinary frequency-she feels like she goes all day.  She thinks it could be related to the losartan.  She thought it was the white pill and - 3 weeks she changed to the green pill, but has not gotten better.  She does have some urgency.  She denies any dysuria or blood in the urine.  He does have some fracture to go or urgency.  She was concerned about a urinary tract infection.     B/l leg pain - front and back and lower legs and now in upper legs as well.  It started about two weeks ago.  It is getting worse.  Last night she had severe cramping in her left lower leg and pain was severe.  She has been taking Advil and Tylenol throughout the day but it does not help much.  On Mounjaro weekly.   Not drinking much.  Glass of tea in am, something at night.  Eats soft pretzel (frozen) 1-2/day and a 1-2 cups of yogurt a day.   Somedays will have a boiled egg on toast.  Somedays may have apple sandwich with mayo.  She is not eating much at all.  If she tries to eat other foods - will get nausea.    She does not take any vitamins or supplements.  Medications and allergies reviewed with patient and updated if appropriate.  Current Outpatient Medications on File Prior to Visit  Medication Sig Dispense Refill   albuterol (VENTOLIN HFA) 108 (90 Base) MCG/ACT inhaler INHALE 1 TO 2 PUFFS INTO THE LUNGS EVERY 6 HOURS AS NEEDED FOR WHEEZING OR SHORTNESS OF BREATH 6.7 g 2   cyclobenzaprine (FLEXERIL) 5 MG tablet Take 1 tablet (5 mg total) by mouth 3 (three) times daily as needed for muscle spasms. 30 tablet 1   ibuprofen (ADVIL) 200 MG tablet Take 200 mg by mouth every 6 (six)  hours as needed for headache or mild pain.     levothyroxine (SYNTHROID) 175 MCG tablet TAKE 1 TABLET(175 MCG) BY MOUTH DAILY 90 tablet 0   losartan (COZAAR) 100 MG tablet TAKE 1 TABLET(100 MG) BY MOUTH DAILY 90 tablet 0   omeprazole (PRILOSEC) 20 MG capsule Take 20 mg by mouth daily.     tirzepatide (MOUNJARO) 10 MG/0.5ML Pen Inject 10 mg into the skin once a week. 2 mL 0   tirzepatide (MOUNJARO) 12.5 MG/0.5ML Pen Inject 12.5 mg into the skin once a week. 2 mL 0   tirzepatide (MOUNJARO) 15 MG/0.5ML Pen Inject 15 mg into the skin once a week. 6 mL 3   No current facility-administered medications on file prior to visit.    Review of Systems  Eyes:  Negative for visual disturbance.  Genitourinary:  Positive for frequency and urgency. Negative for dysuria and hematuria.  Musculoskeletal:  Positive for myalgias (muscle cramping/pain).  Neurological:  Positive for headaches (last night).       Objective:   Vitals:   08/03/23 1104  BP: 128/84  Pulse: 72  Temp: 98 F (36.7 C)  SpO2: 96%   BP Readings from  Last 3 Encounters:  08/03/23 128/84  06/22/23 134/86  05/31/23 128/86   Wt Readings from Last 3 Encounters:  08/03/23 187 lb (84.8 kg)  06/22/23 188 lb (85.3 kg)  05/31/23 191 lb (86.6 kg)   Body mass index is 33.66 kg/m.    Physical Exam Constitutional:      General: She is not in acute distress.    Appearance: Normal appearance. She is not ill-appearing.  HENT:     Head: Normocephalic and atraumatic.  Musculoskeletal:        General: Tenderness (Mild tenderness with palpation bilateral lower leg muscles.  No active spasm) present.     Right lower leg: No edema.     Left lower leg: No edema.  Skin:    General: Skin is warm and dry.  Neurological:     Mental Status: She is alert.     Sensory: No sensory deficit.     Motor: No weakness.            Assessment & Plan:    Muscle cramping: Acute Started 2 weeks ago Dehydration likely contributing-stressed  that she needs to be drinking more water Electrolyte disturbance could also be causing it-she is not eating much and not getting good nutrition and not currently taking any supplements Check blood work-CBC, CMP, magnesium, vitamin B12 level Advised starting a good multivitamin Encouraged her to work harder and getting better nutrition in her diet. Advised her to take Flexeril 5-10 mg at bedtime   Urinary frequency: Subacute Has been going on for a while Has frequency and urgency and some pressure ago, but no dysuria or hematuria Feels it may be related to the losartan Urine dip here without evidence of infection and since this is not acute will not send for culture

## 2023-08-03 NOTE — Patient Instructions (Addendum)
      Blood work was ordered.       Increase fluid intake - especially water.   Start a good multivitamin.     Try the flexeril 5 mg at night for the muscle relaxer.     Return if symptoms worsen or fail to improve.

## 2023-08-04 ENCOUNTER — Other Ambulatory Visit: Payer: Self-pay | Admitting: Internal Medicine

## 2023-08-22 ENCOUNTER — Other Ambulatory Visit: Payer: Self-pay | Admitting: Internal Medicine

## 2023-09-13 ENCOUNTER — Telehealth: Payer: Self-pay | Admitting: Internal Medicine

## 2023-09-13 NOTE — Telephone Encounter (Unsigned)
 Copied from CRM [id number]. Topic: Clinical - Medication Refill >> Sep 13, 2023 11:42 AM Albertha Alosa wrote: Medication: tirzepatide  (MOUNJARO ) 15 MG/0.5ML Pen  Has the patient contacted their pharmacy? Yes (Agent: If no, request that the patient contact the pharmacy for the refill. If patient does not wish to contact the pharmacy document the reason why and proceed with request.) (Agent: If yes, when and what did the pharmacy advise?)  This is the patient's preferred pharmacy:  [id number] - [place], [place] - [address] AT [facility] [address] [place] [address] Phone: [phone] Fax: [phone]  Is this the correct pharmacy for this prescription? Yes If no, delete pharmacy and type the correct one.   Has the prescription been filled recently? No  Is the patient out of the medication? Yes  Has the patient been seen for an appointment in the last year OR does the patient have an upcoming appointment? Yes  Can we respond through MyChart? Yes  Agent: Please be advised that Rx refills may take up to 3 business days. We ask that you follow-up with your pharmacy.

## 2023-09-16 MED ORDER — TIRZEPATIDE 15 MG/0.5ML ~~LOC~~ SOAJ: 15.0000 mg | SUBCUTANEOUS | 0 refills | Status: DC

## 2023-09-19 ENCOUNTER — Emergency Department (HOSPITAL_BASED_OUTPATIENT_CLINIC_OR_DEPARTMENT_OTHER): Admission: EM | Admit: 2023-09-19 | Discharge: 2023-09-19 | Disposition: A | Discharge status: Home / Self Care

## 2023-09-19 ENCOUNTER — Encounter (HOSPITAL_BASED_OUTPATIENT_CLINIC_OR_DEPARTMENT_OTHER): Payer: Self-pay | Admitting: Emergency Medicine

## 2023-09-19 ENCOUNTER — Other Ambulatory Visit: Payer: Self-pay

## 2023-09-19 DIAGNOSIS — Z79899 Other long term (current) drug therapy: Secondary | ICD-10-CM | POA: Insufficient documentation

## 2023-09-19 DIAGNOSIS — K0889 Other specified disorders of teeth and supporting structures: Secondary | ICD-10-CM | POA: Insufficient documentation

## 2023-09-19 DIAGNOSIS — J45909 Unspecified asthma, uncomplicated: Secondary | ICD-10-CM | POA: Insufficient documentation

## 2023-09-19 DIAGNOSIS — Z7951 Long term (current) use of inhaled steroids: Secondary | ICD-10-CM | POA: Insufficient documentation

## 2023-09-19 DIAGNOSIS — E119 Type 2 diabetes mellitus without complications: Secondary | ICD-10-CM | POA: Insufficient documentation

## 2023-09-19 MED ORDER — TRAMADOL HCL 50 MG PO TABS: 50.0000 mg | ORAL_TABLET | Freq: Four times a day (QID) | ORAL | 0 refills | Status: DC | PRN

## 2023-09-19 MED ORDER — KETOROLAC TROMETHAMINE 15 MG/ML IJ SOLN
15.0000 mg | Freq: Once | INTRAMUSCULAR | Status: AC
  Administered 2023-09-19: 15 mg via INTRAMUSCULAR
  Filled 2023-09-19: qty 1

## 2023-09-19 NOTE — ED Provider Notes (Signed)
 [place] [facility] AT [facility] Provider Note   CSN: [id number] Arrival date & time: 09/19/23  1745     History  Chief Complaint  Patient presents with   Dental Pain    Katrina [name] is a 68 y.o. female.  68 year old female presenting to the emergency department today with dental pain.  Patient states this been going now for the past few days.  She did follow-up with a dentist today.  Was prescribed antibiotics.  She was told that she would likely need a crown at some point or potential extraction.  She was told that has a dentist that they cannot provide any pain medication and that she should come to the ER for further evaluation.  The patient has been taking Tylenol  and ibuprofen around-the-clock for this over the past few days and has not been helping with her pain.  She came here today for further evaluation.  She denies any difficulty breathing or swallowing.   Dental Pain      Home Medications Prior to Admission medications   Medication Sig Start Date End Date Taking? Authorizing Provider  traMADol  (ULTRAM ) 50 MG tablet Take 1 tablet (50 mg total) by mouth every 6 (six) hours as needed. 09/19/23  Yes Carin Charleston, MD  albuterol  (VENTOLIN  HFA) 108 (90 Base) MCG/ACT inhaler INHALE 1 TO 2 PUFFS INTO THE LUNGS EVERY 6 HOURS AS NEEDED FOR WHEEZING OR SHORTNESS OF BREATH 02/08/22   Roslyn Coombe, MD  cyclobenzaprine  (FLEXERIL ) 5 MG tablet Take 1 tablet (5 mg total) by mouth 3 (three) times daily as needed for muscle spasms. 04/19/23   Adelia Homestead, MD  ibuprofen (ADVIL) 200 MG tablet Take 200 mg by mouth every 6 (six) hours as needed for headache or mild pain.    [provider]  levothyroxine  (SYNTHROID ) 175 MCG tablet TAKE 1 TABLET(175 MCG) BY MOUTH DAILY 08/08/23   Adelia Homestead, MD  losartan  (COZAAR ) 100 MG tablet TAKE 1 TABLET(100 MG) BY MOUTH DAILY 08/24/23   Adelia Homestead, MD  omeprazole  (PRILOSEC) 20 MG capsule Take 20  mg by mouth daily.    [provider]  tirzepatide  (MOUNJARO ) 15 MG/0.5ML Pen Inject 15 mg into the skin once a week. 09/16/23   Adelia Homestead, MD      Allergies    Penicillins, Amlodipine , Lisinopril , Metformin  and related, and Bactrim  ds [sulfamethoxazole -trimethoprim ]    Review of Systems   Review of Systems  HENT:  Positive for dental problem.   All other systems reviewed and are negative.   Physical Exam Updated Vital Signs BP (!) 188/84 (BP Location: Right Arm)   Pulse 71   Temp 98.8 F (37.1 C)   Resp 18   Ht 5' 2.5" (1.588 m)   Wt 83.9 kg   SpO2 95%   BMI 33.30 kg/m  Physical Exam Vitals and nursing note reviewed.  Constitutional:      Appearance: Normal appearance.  HENT:     Mouth/Throat:     Comments: Cracked back left molar with mild gingival swelling, no abscess, no neck swelling Neurological:     Mental Status: She is alert.     ED Results / Procedures / Treatments   Labs (all labs ordered are listed, but only abnormal results are displayed) Labs Reviewed - No data to display  EKG None  Radiology No results found.  Procedures Procedures    Medications Ordered in ED Medications  ketorolac (TORADOL) 15 MG/ML injection  15 mg (has no administration in time range)    ED Course/ Medical Decision Making/ A&P                                 Medical Decision Making 68 year old female with past medical history of diabetes, asthma, GERD presenting to the emergency department today with dental pain.  The patient does not have any symptoms concerning for deep space soft tissue infection at this time.  Will give the patient Toradol here after reviewing her renal function which is normal.  It looks like she has tolerated tramadol  in the past.  Will provide her a short course of this to take until the antibiotics kick in.  She will be discharged with return precautions.  Risk Prescription drug management.           Final  Clinical Impression(s) / ED Diagnoses Final diagnoses:  Pain, dental    Rx / DC Orders ED Discharge Orders          Ordered    traMADol  (ULTRAM ) 50 MG tablet  Every 6 hours PRN        09/19/23 1945              Carin Charleston, MD 09/19/23 1945

## 2023-09-19 NOTE — ED Triage Notes (Signed)
 Pt POV steady gait- pt c/o L lower dental pain.  Seen at dentist today, prescribed antibiotics but dentist stated he could not prescribe pain medication. L lower jaw swelling x3 days.   Denies known fever.

## 2023-09-19 NOTE — Discharge Instructions (Signed)
 Please take the antibiotics that were prescribed by your dentist.  Continue to take the Tylenol  and ibuprofen at home for pain.  If you are still having pain is okay to take the tramadol .  Do not drive or drink alcohol while taking this as may make you drowsy.  Follow-up with your dentist or return to the ER for worsening symptoms.  You may also try to pick up some Orajel from the pharmacy and use this topically as directed.

## 2023-09-20 ENCOUNTER — Ambulatory Visit (INDEPENDENT_AMBULATORY_CARE_PROVIDER_SITE_OTHER): Admitting: Internal Medicine

## 2023-09-20 ENCOUNTER — Encounter: Payer: Self-pay | Admitting: Internal Medicine

## 2023-09-20 ENCOUNTER — Ambulatory Visit: Payer: Self-pay | Admitting: Internal Medicine

## 2023-09-20 ENCOUNTER — Ambulatory Visit (INDEPENDENT_AMBULATORY_CARE_PROVIDER_SITE_OTHER)

## 2023-09-20 ENCOUNTER — Ambulatory Visit: Payer: Self-pay

## 2023-09-20 VITALS — BP 142/88 | HR 77 | Temp 97.8°F | Resp 16 | Ht 62.5 in | Wt 185.0 lb

## 2023-09-20 DIAGNOSIS — E118 Type 2 diabetes mellitus with unspecified complications: Secondary | ICD-10-CM | POA: Diagnosis not present

## 2023-09-20 DIAGNOSIS — I10 Essential (primary) hypertension: Secondary | ICD-10-CM

## 2023-09-20 DIAGNOSIS — R221 Localized swelling, mass and lump, neck: Secondary | ICD-10-CM | POA: Diagnosis not present

## 2023-09-20 DIAGNOSIS — M542 Cervicalgia: Secondary | ICD-10-CM | POA: Diagnosis not present

## 2023-09-20 DIAGNOSIS — K0889 Other specified disorders of teeth and supporting structures: Secondary | ICD-10-CM | POA: Insufficient documentation

## 2023-09-20 DIAGNOSIS — M47812 Spondylosis without myelopathy or radiculopathy, cervical region: Secondary | ICD-10-CM | POA: Diagnosis not present

## 2023-09-20 LAB — BASIC METABOLIC PANEL WITH GFR
BUN: 11 mg/dL (ref 6–23)
CO2: 28 meq/L (ref 19–32)
Calcium: 9.5 mg/dL (ref 8.4–10.5)
Chloride: 102 meq/L (ref 96–112)
Creatinine, Ser: 0.6 mg/dL (ref 0.40–1.20)
GFR: 91.52 mL/min (ref >60.00)
Glucose, Bld: 104 mg/dL — ABNORMAL HIGH (ref 70–99)
Potassium: 4 meq/L (ref 3.5–5.1)
Sodium: 137 meq/L (ref 135–145)

## 2023-09-20 LAB — URINALYSIS, ROUTINE W REFLEX MICROSCOPIC
Bilirubin Urine: NEGATIVE
Hgb urine dipstick: NEGATIVE
Ketones, ur: NEGATIVE
Leukocytes,Ua: NEGATIVE
Nitrite: NEGATIVE
Specific Gravity, Urine: >=1.03 — AB (ref 1.000–1.030)
Urine Glucose: NEGATIVE
Urobilinogen, UA: 0.2 (ref 0.0–1.0)
pH: 6 (ref 5.0–8.0)

## 2023-09-20 LAB — CBC WITH DIFFERENTIAL/PLATELET
Basophils Absolute: 0.1 10*3/uL (ref 0.0–0.1)
Basophils Relative: 0.5 % (ref 0.0–3.0)
Eosinophils Absolute: 0.1 10*3/uL (ref 0.0–0.7)
Eosinophils Relative: 1.1 % (ref 0.0–5.0)
HCT: 41.4 % (ref 36.0–46.0)
Hemoglobin: 13.8 g/dL (ref 12.0–15.0)
Lymphocytes Relative: 18.7 % (ref 12.0–46.0)
Lymphs Abs: 2.1 10*3/uL (ref 0.7–4.0)
MCHC: 33.3 g/dL (ref 30.0–36.0)
MCV: 81.7 fl (ref 78.0–100.0)
Monocytes Absolute: 0.7 10*3/uL (ref 0.1–1.0)
Monocytes Relative: 6.8 % (ref 3.0–12.0)
Neutro Abs: 8.1 10*3/uL — ABNORMAL HIGH (ref 1.4–7.7)
Neutrophils Relative %: 72.9 % (ref 43.0–77.0)
Platelets: 263 10*3/uL (ref 150.0–400.0)
RBC: 5.07 Mil/uL (ref 3.87–5.11)
RDW: 13.5 % (ref 11.5–15.5)
WBC: 11.1 10*3/uL — ABNORMAL HIGH (ref 4.0–10.5)

## 2023-09-20 LAB — C-REACTIVE PROTEIN: CRP: 12.3 mg/dL (ref 0.5–20.0)

## 2023-09-20 LAB — MICROALBUMIN / CREATININE URINE RATIO
Creatinine,U: 166.4 mg/dL
Microalb Creat Ratio: 21.3 mg/g (ref 0.0–30.0)
Microalb, Ur: 3.5 mg/dL — ABNORMAL HIGH (ref 0.0–1.9)

## 2023-09-20 LAB — HEMOGLOBIN A1C: Hgb A1c MFr Bld: 6 % (ref 4.6–6.5)

## 2023-09-20 LAB — SEDIMENTATION RATE: Sed Rate: 47 mm/h — ABNORMAL HIGH (ref 0–30)

## 2023-09-20 MED ORDER — OXYCODONE HCL 5 MG PO TABS: 5.0000 mg | ORAL_TABLET | ORAL | 0 refills | Status: DC | PRN

## 2023-09-20 NOTE — Progress Notes (Unsigned)
 Subjective:  Patient ID: Katrina [name], female    DOB: 08/14/1955  Age: 68 y.o. MRN: [id number]  CC: Dental Pain (Left side of her face is swollen. Pain has been going on since Friday. Patient was seen by the dentist yesterday and this swelling started last night. )   HPI Katrina [name] presents for f/up ----  Discussed the use of AI scribe software for clinical note transcription with the patient, who gave verbal consent to proceed.  History of Present Illness   Katrina [name] is a 68 year old female who presents with severe dental pain and swelling.  She began experiencing throbbing left lower tooth pain on Friday night, which persisted throughout the weekend. On Monday, she visited a dentist who performed an x-ray and suspected a crack in her molar. She was prescribed clindamycin but was advised to visit the ER for further pain management as the dentist could not provide pain medication.  At the ER, she received a Toradol shot and a prescription for tramadol . Tramadol  has been effective in managing her pain, although she only takes a small portion to avoid excessive sedation. Prior to taking tramadol , she was unable to eat or drink for two days due to the pain, and she experienced a sore throat and 'electrical shock' sensations in her neck.  She notes swelling in her neck and difficulty swallowing, although she denies any trouble breathing. The pain radiates into her ear and she describes it as severe. No chest pain, shortness of breath, or coughing. She has taken four doses of clindamycin without any side effects.       Outpatient Medications Prior to Visit  Medication Sig Dispense Refill   albuterol  (VENTOLIN  HFA) 108 (90 Base) MCG/ACT inhaler INHALE 1 TO 2 PUFFS INTO THE LUNGS EVERY 6 HOURS AS NEEDED FOR WHEEZING OR SHORTNESS OF BREATH 6.7 g 2   clindamycin (CLEOCIN) 300 MG capsule Take 300 mg by mouth every 6 (six) hours.     cyclobenzaprine  (FLEXERIL ) 5 MG tablet Take 1 tablet (5 mg  total) by mouth 3 (three) times daily as needed for muscle spasms. 30 tablet 1   ibuprofen (ADVIL) 200 MG tablet Take 200 mg by mouth every 6 (six) hours as needed for headache or mild pain.     levothyroxine  (SYNTHROID ) 175 MCG tablet TAKE 1 TABLET(175 MCG) BY MOUTH DAILY 90 tablet 0   losartan  (COZAAR ) 100 MG tablet TAKE 1 TABLET(100 MG) BY MOUTH DAILY 90 tablet 0   omeprazole  (PRILOSEC) 20 MG capsule Take 20 mg by mouth daily.     tirzepatide  (MOUNJARO ) 15 MG/0.5ML Pen Inject 15 mg into the skin once a week. 6 mL 0   traMADol  (ULTRAM ) 50 MG tablet Take 1 tablet (50 mg total) by mouth every 6 (six) hours as needed. 15 tablet 0   No facility-administered medications prior to visit.    ROS Review of Systems  HENT:  Positive for trouble swallowing. Negative for sore throat and voice change.     Objective:  BP (!) 142/88 (BP Location: Left Arm, Patient Position: Sitting, Cuff Size: Normal)   Pulse 77   Temp 97.8 F (36.6 C) (Oral)   Resp 16   Ht 5' 2.5" (1.588 m)   Wt 185 lb (83.9 kg)   SpO2 96%   BMI 33.30 kg/m   BP Readings from Last 3 Encounters:  09/20/23 (!) 142/88  09/19/23 (!) 188/84  08/03/23 128/84    Wt Readings from Last 3  Encounters:  09/20/23 185 lb (83.9 kg)  09/19/23 185 lb (83.9 kg)  08/03/23 187 lb (84.8 kg)    Physical Exam Vitals reviewed.  Constitutional:      General: She is not in acute distress.    Appearance: She is ill-appearing. She is not toxic-appearing or diaphoretic.  HENT:     Head:     Jaw: Trismus, tenderness, swelling and pain on movement present. No malocclusion.     Mouth/Throat:     Mouth: Mucous membranes are moist.     Dentition: No dental tenderness, gingival swelling, dental caries or dental abscesses.     Palate: No mass and lesions.     Pharynx: No oropharyngeal exudate.  Eyes:     General: No scleral icterus.    Conjunctiva/sclera: Conjunctivae normal.  Neck:     Thyroid : No thyroid  mass, thyromegaly or thyroid   tenderness.     Trachea: Trachea normal.   Cardiovascular:     Rate and Rhythm: Normal rate and regular rhythm.     Pulses: Normal pulses.     Heart sounds: No murmur heard.    No friction rub. No gallop.     Comments: EKG--- NSR, 74 bpm No LVH, Q waves, or ST/T wave changes  Pulmonary:     Effort: Pulmonary effort is normal.     Breath sounds: No stridor. No wheezing, rhonchi or rales.  Abdominal:     General: Abdomen is protuberant. There is no distension.     Palpations: There is no mass.     Tenderness: There is no abdominal tenderness. There is no guarding.     Hernia: No hernia is present.  Musculoskeletal:     Cervical back: Neck supple. No erythema, tenderness or crepitus.     Right lower leg: No edema.     Left lower leg: No edema.  Lymphadenopathy:     Cervical: No cervical adenopathy.  Skin:    General: Skin is warm and dry.     Findings: No rash.  Neurological:     General: No focal deficit present.     Mental Status: She is alert. Mental status is at baseline.  Psychiatric:        Mood and Affect: Mood normal.        Behavior: Behavior normal.     Lab Results  Component Value Date   WBC 11.1 (H) 09/20/2023   HGB 13.8 09/20/2023   HCT 41.4 09/20/2023   PLT 263.0 09/20/2023   GLUCOSE 104 (H) 09/20/2023   CHOL 124 09/07/2022   TRIG 207.0 (H) 09/07/2022   HDL 25.60 (L) 09/07/2022   LDLDIRECT 70.0 09/07/2022   ALT 19 08/03/2023   AST 18 08/03/2023   NA 137 09/20/2023   K 4.0 09/20/2023   CL 102 09/20/2023   CREATININE 0.60 09/20/2023   BUN 11 09/20/2023   CO2 28 09/20/2023   TSH 1.99 09/07/2022   INR 1.10 01/13/2015   HGBA1C 6.0 09/20/2023   MICROALBUR 3.5 (H) 09/20/2023    DG Neck Soft Tissue Result Date: 09/20/2023 CLINICAL DATA:  Pain and swelling.  Patient reports toothache. EXAM: NECK SOFT TISSUES - 1+ VIEW COMPARISON:  None Available. FINDINGS: There is no evidence of retropharyngeal soft tissue swelling or epiglottic enlargement. The  cervical airway is unremarkable. No radio-opaque foreign body identified. The soft tissue planes are non suspicious. Degenerative change in the cervical spine. IMPRESSION: Negative soft tissue neck radiographs. If clinically indicated, consider further assessment with contrast-enhanced CT. Electronically Signed  By: Chadwick Colonel M.D.   On: 09/20/2023 17:47     Assessment & Plan:  Localized swelling, mass and lump, neck -     CBC with Differential/Platelet; Future -     Basic metabolic panel with GFR; Future -     C-reactive protein; Future -     Sedimentation rate; Future -     CT SOFT TISSUE NECK W CONTRAST; Future -     DG Neck Soft Tissue; Future  Type 2 diabetes with complication (HCC) -     Urinalysis, Routine w reflex microscopic; Future -     Hemoglobin A1c; Future -     Microalbumin / creatinine urine ratio; Future  Primary hypertension -     EKG 12-Lead  Pain, dental -     oxyCODONE HCl; Take 1 tablet (5 mg total) by mouth every 4 (four) hours as needed for severe pain (pain score 7-10).  Dispense: 30 tablet; Refill: 0     Follow-up: Return if symptoms worsen or fail to improve.  Sandra Crouch, MD

## 2023-09-20 NOTE — Telephone Encounter (Signed)
  Chief Complaint: dental pain Symptoms: dental pain and jaw swelling Frequency: contact Pertinent Negatives: Patient denies shortness of breath Disposition: [] ED /[] Urgent Care (no appt availability in office) / [] Appointment(In office/virtual)/ []  [facility]/ [] Home Care/ [] Refused Recommended Disposition /[] [facility]/ []  Follow-up with PCP Additional Notes: started on abx yesterday afternoon. Went to ed last night for pain and given toradol and tramadol  for pain. Now presents with worsening pain and swelling along the jaw line. Appt scheduled at PCP for today at 3:40  Copied from CRM [id number]. Topic: Clinical - Red Word Triage >> Sep 20, 2023  2:22 PM [place] J wrote: [facility] that prompted transfer to Nurse Triage: Patient has an infected tooth and her dentist put her on clindamycin. The patient took a third dose today and now her whole neck is swollen. Reason for Disposition  Face is very swollen  Protocols used: Toothache-A-AH

## 2023-09-20 NOTE — Patient Instructions (Signed)
 Dental Abscess  A dental abscess is an infection around a tooth that may involve pain, swelling, and a collection of pus, as well as other symptoms. Treatment is important to help with symptoms and to prevent the infection from spreading. The general types of dental abscesses are: Pulpal abscess. This abscess may form from the inner part of the tooth (pulp). Periodontal abscess. This abscess may form from the gum. What are the causes? This condition is caused by a bacterial infection in or around the tooth. It may result from: Severe tooth decay (cavities). Trauma to the tooth, such as a broken or chipped tooth. What increases the risk? This condition is more likely to develop in males. It is also more likely to develop in people who: Have cavities. Have severe gum disease. Eat sugary snacks between meals. Use tobacco products. Have diabetes. Have a weakened disease-fighting system (immune system). Do not brush and care for their teeth regularly. What are the signs or symptoms? Mild symptoms of this condition include: Tenderness. Bad breath. Fever. A bitter taste in the mouth. Pain in and around the infected tooth. Moderate symptoms of this condition include: Swollen neck glands. Chills. Pus drainage. Swelling and redness around the infected tooth, in the mouth, or in the face. Severe pain in and around the infected tooth. Severe symptoms of this condition include: Difficulty swallowing. Difficulty opening the mouth. Nausea. Vomiting. How is this diagnosed? This condition is diagnosed based on: Your symptoms and your medical and dental history. An examination of the infected tooth. During the exam, your dental care provider may tap on the infected tooth. You may also need to have X-rays taken of the affected area. How is this treated? This condition is treated by getting rid of the infection. This may be done with: Antibiotic medicines. These may be used in certain  situations. Antibacterial mouth rinse. Incision and drainage. This procedure is done by making an incision in the abscess to drain out the pus. Removing pus is the first priority in treating an abscess. A root canal. This may be performed to save the tooth. Your dental care provider accesses the visible part of your tooth (crown) with a drill and removes any infected pulp. Then the space is filled and sealed off. Tooth extraction. The tooth is pulled out if it cannot be saved by other treatment. You may also receive treatment for pain, such as: Acetaminophen or NSAIDs. Gels that contain a numbing medicine. An injection to block the pain near your nerve. Follow these instructions at home: Medicines Take over-the-counter and prescription medicines only as told by your dental care provider. If you were prescribed an antibiotic, take it as told by your dental care provider. Do not stop taking the antibiotic even if you start to feel better. If you were prescribed a gel that contains a numbing medicine, use it exactly as told in the directions. Do not use these gels for children who are younger than 80 years of age. Use an antibacterial mouth rinse as told by your dental care provider. General instructions  Gargle with a mixture of salt and water 3-4 times a day or as needed. To make salt water, completely dissolve -1 tsp (3-6 g) of salt in 1 cup (237 mL) of warm water. Eat a soft diet while your abscess is healing. Drink enough fluid to keep your urine pale yellow. Do not apply heat to the outside of your mouth. Do not use any products that contain nicotine or tobacco. These  products include cigarettes, chewing tobacco, and vaping devices, such as e-cigarettes. If you need help quitting, ask your dental care provider. Keep all follow-up visits. This is important. How is this prevented?  Excellent dental home care, which includes brushing your teeth every morning and night with fluoride  toothpaste. Floss one time each day. Get regularly scheduled dental cleanings. Consider having a dental sealant applied on teeth that have deep grooves to prevent cavities. Drink fluoridated water regularly. This includes most tap water. Check the label on bottled water to see if it contains fluoride. Reduce or eliminate sugary drinks. Eat healthy meals and snacks. Wear a mouth guard or face shield to protect your teeth while playing sports. Contact a health care provider if: Your pain is worse and is not helped by medicine. You have swelling. You see pus around the tooth. You have a fever or chills. Get help right away if: Your symptoms suddenly get worse. You have a very bad headache. You have problems breathing or swallowing. You have trouble opening your mouth. You have swelling in your neck or around your eye. These symptoms may represent a serious problem that is an emergency. Do not wait to see if the symptoms will go away. Get medical help right away. Call your local emergency services (911 in the [place] Do not drive yourself to the hospital. Summary A dental abscess is a collection of pus in or around a tooth that results from an infection. A dental abscess may result from severe tooth decay, trauma to the tooth, or severe gum disease around a tooth. Symptoms include severe pain, swelling, redness, and drainage of pus in and around the infected tooth. The first priority in treating a dental abscess is to drain out the pus. Treatment may also involve removing damage inside the tooth (root canal) or extracting the tooth. This information is not intended to replace advice given to you by your health care provider. Make sure you discuss any questions you have with your health care provider. Document Revised: 06/12/2020 Document Reviewed: 06/12/2020 Elsevier Patient Education  2024 [employer].

## 2023-09-21 ENCOUNTER — Ambulatory Visit
Admission: RE | Admit: 2023-09-21 | Discharge: 2023-09-21 | Disposition: A | Discharge status: Home / Self Care | Source: Ambulatory Visit | Attending: Internal Medicine | Admitting: Internal Medicine

## 2023-09-21 DIAGNOSIS — R221 Localized swelling, mass and lump, neck: Secondary | ICD-10-CM

## 2023-09-21 DIAGNOSIS — K1121 Acute sialoadenitis: Secondary | ICD-10-CM | POA: Diagnosis not present

## 2023-09-21 MED ORDER — IOPAMIDOL (ISOVUE-300) INJECTION 61%
75.0000 mL | Freq: Once | INTRAVENOUS | Status: AC | PRN
  Administered 2023-09-21: 75 mL via INTRAVENOUS

## 2023-09-23 ENCOUNTER — Ambulatory Visit: Payer: Self-pay

## 2023-09-23 NOTE — Telephone Encounter (Signed)
            Copied from CRM [id number]. Topic: Clinical - Red Word [name] >> Sep 23, 2023  9:18 AM Katrina [name] wrote: Katrina [name] that prompted transfer to Katrina [name]: was seen in acute for pain in jaw.. prescribed Tramodal clindamycin .Katrina [name] She thinks the tramodol is causing her itching.. needs alternative? Reason for Disposition . [1] Pharmacy calling with prescription question AND [2] triager unable to answer question  Answer Assessment - Initial Assessment Questions 1. NAME of MEDICINE: "What medicine(s) are you calling about?"     Tramadol - Clindamycin - unsure of which  2. QUESTION: "What is your question?" ([name], double dose of medicine, side effect)     Side effect 3. PRESCRIBER: "Who prescribed the medicine?" Reason: if prescribed by specialist, call should be referred to that group.     Needs something stronger  4. SYMPTOMS: "Do you have any symptoms?" If Yes, ask: "What symptoms are you having?"  "How bad are the symptoms ([name], mild, moderate, severe)     Itching all over- started on hands then generalized. Last dose was last night.  Pt made aware of Oxycodone prescription. Pt is going to stop Tramadol . Last dose taken of Tramadol  was last night. Advised pt to call back this afternoon to let us  know if itching persists or worsens.  Protocols used: Medication Question Call-A-AH

## 2023-09-23 NOTE — Telephone Encounter (Signed)
 FYI Only or Action Required?: FYI only for provider  Patient was last seen in primary care on 09/20/2023 by Arcadio Knuckles, MD. Called Nurse Triage reporting Medication Problem. Symptoms began several days ago. Interventions attempted: Nothing. Symptoms are: unchanged.  Triage Disposition: Call PCP Now  Patient/caregiver understands and will follow disposition?: Yes

## 2023-09-23 NOTE — Telephone Encounter (Signed)
**Note De-identified  [place] Obfuscation** Please advise 

## 2023-11-18 ENCOUNTER — Other Ambulatory Visit: Payer: Self-pay | Admitting: Internal Medicine

## 2023-12-15 ENCOUNTER — Ambulatory Visit: Payer: PPO

## 2023-12-15 VITALS — Ht 62.5 in | Wt 185.0 lb

## 2023-12-15 DIAGNOSIS — Z780 Asymptomatic menopausal state: Secondary | ICD-10-CM

## 2023-12-15 DIAGNOSIS — Z0000 Encounter for general adult medical examination without abnormal findings: Secondary | ICD-10-CM | POA: Diagnosis not present

## 2023-12-15 NOTE — Patient Instructions (Signed)
 Katrina [name] , Thank you for taking time out of your busy schedule to complete your Annual Wellness Visit with me. I enjoyed our conversation and look forward to speaking with you again next year. I, as well as your care team,  appreciate your ongoing commitment to your health goals. Please review the following plan we discussed and let me know if I can assist you in the future. Your Game plan/ To Do List    Referrals: If you haven't heard from the office you've been referred to, please reach out to them at the phone provided.  You have an order for:  [x]   Bone Density     Please call for appointment:  [facility] [address], [place] [address] [phone]  Make sure to wear two-piece clothing.  No lotions, powders, or deodorants the day of the appointment. Make sure to bring picture ID and insurance card.  Bring list of medications you are currently taking including any supplements.    Follow up Visits: We will see or speak with you next year for your Next Medicare AWV with our clinical staff Have you seen your provider in the last 6 months (3 months if uncontrolled diabetes)? Yes.  Last office on 09/20/2023.  Clinician Recommendations:  Aim for 30 minutes of exercise or brisk walking, 6-8 glasses of water, and 5 servings of fruits and vegetables each day. You are due for a pneumonia and a Shingles vaccine, which you can get both done at the pharmacy.        This is a list of the screenings recommended for you:  Health Maintenance  Topic Date Due   Pneumococcal Vaccine for age over 42 (1 of 2 - PCV) Never done   Zoster (Shingles) Vaccine (1 of 2) Never done   DEXA scan (bone density measurement)  Never done   Complete foot exam   04/24/2023   COVID-19 Vaccine (4 - Pfizer risk 2024-25 season) 09/04/2023   Flu Shot  11/18/2023   Hemoglobin A1C  03/21/2024   Eye exam for diabetics  05/04/2024   Yearly kidney function blood test for diabetes   09/19/2024   Yearly kidney health urinalysis for diabetes  09/19/2024   Medicare Annual Wellness Visit  12/14/2024   Mammogram  05/30/2025   Colon Cancer Screening  04/03/2027   DTaP/Tdap/Td vaccine (2 - Td or Tdap) 03/06/2033   Hepatitis C Screening  Completed   HPV Vaccine  Aged Out   Meningitis B Vaccine  Aged Out    Advanced directives: (Declined) Advance directive discussed with you today. Even though you declined this today, please call our office should you change your mind, and we can give you the proper paperwork for you to fill out. Advance Care Planning is important because it:  [x]  Makes sure you receive the medical care that is consistent with your values, goals, and preferences  [x]  It provides guidance to your family and loved ones and reduces their decisional burden about whether or not they are making the right decisions based on your wishes.  Follow the link provided in your after visit summary or read over the paperwork we have mailed to you to help you started getting your Advance Directives in place. If you need assistance in completing these, please reach out to us  so that we can help you!  See attachments for Preventive Care and Fall Prevention Tips.

## 2023-12-15 NOTE — Progress Notes (Signed)
 Subjective:   Katrina [name] is a 68 y.o. who presents for a Medicare Wellness preventive visit.  As a reminder, Annual Wellness Visits don't include a physical exam, and some assessments may be limited, especially if this visit is performed virtually. We may recommend an in-person follow-up visit with your provider if needed.  Visit Complete: Virtual I connected with  Katrina [name] on 12/15/23 by a audio enabled telemedicine application and verified that I am speaking with the correct person using two identifiers.  Patient Location: Home  Provider Location: Office/Clinic  I discussed the limitations of evaluation and management by telemedicine. The patient expressed understanding and agreed to proceed.  Vital Signs: Because this visit was a virtual/telehealth visit, some criteria may be missing or patient reported. Any vitals not documented were not able to be obtained and vitals that have been documented are patient reported.  VideoDeclined- This patient declined Librarian, academic. Therefore the visit was completed with audio only.  Persons Participating in Visit: Patient.  AWV Questionnaire: No: Patient Medicare AWV questionnaire was not completed prior to this visit.  Cardiac Risk Factors include: advanced age (>23men, >38 women);hypertension;diabetes mellitus;dyslipidemia;obesity (BMI >30kg/m2)     Objective:    Today's Vitals   12/15/23 0931  Weight: 185 lb (83.9 kg)  Height: 5' 2.5 (1.588 m)   Body mass index is 33.3 kg/m.     12/15/2023    9:39 AM 09/19/2023    6:05 PM 12/14/2022   10:05 AM 05/08/2022    6:34 PM 01/01/2022    3:50 PM 12/16/2021    6:27 PM 12/29/2020   10:54 AM  Advanced Directives  Does Patient Have a Medical Advance Directive? No No No No No No No  Would patient like information on creating a medical advance directive?   No - Patient declined  No - Patient declined  Yes (MAU/Ambulatory/Procedural Areas - Information  given)    Current Medications (verified) Outpatient Encounter Medications as of 12/15/2023  Medication Sig   albuterol  (VENTOLIN  HFA) 108 (90 Base) MCG/ACT inhaler INHALE 1 TO 2 PUFFS INTO THE LUNGS EVERY 6 HOURS AS NEEDED FOR WHEEZING OR SHORTNESS OF BREATH   clindamycin (CLEOCIN) 300 MG capsule Take 300 mg by mouth every 6 (six) hours.   cyclobenzaprine  (FLEXERIL ) 5 MG tablet Take 1 tablet (5 mg total) by mouth 3 (three) times daily as needed for muscle spasms.   ibuprofen (ADVIL) 200 MG tablet Take 200 mg by mouth every 6 (six) hours as needed for headache or mild pain.   levothyroxine  (SYNTHROID ) 175 MCG tablet TAKE 1 TABLET(175 MCG) BY MOUTH DAILY   losartan  (COZAAR ) 100 MG tablet TAKE 1 TABLET(100 MG) BY MOUTH DAILY   omeprazole  (PRILOSEC) 20 MG capsule Take 20 mg by mouth daily.   tirzepatide  (MOUNJARO ) 15 MG/0.5ML Pen Inject 15 mg into the skin once a week.   oxyCODONE  (OXY IR/ROXICODONE ) 5 MG immediate release tablet Take 1 tablet (5 mg total) by mouth every 4 (four) hours as needed for severe pain (pain score 7-10). (Patient not taking: Reported on 12/15/2023)   No facility-administered encounter medications on file as of 12/15/2023.    Allergies (verified) Penicillins, Amlodipine , Lisinopril , Metformin  and related, and Bactrim  ds [sulfamethoxazole -trimethoprim ]   History: Past Medical History:  Diagnosis Date   Asthma 09/27/2021   BCC (basal cell carcinoma of skin) Infundibulocystic 03/11/2008   Inner Left Eye   Chest pain at rest 11/07/2012   Diabetes mellitus without complication (HCC)  Eosinophilic esophagitis 11/10/2012   Esophageal stricture    Exercise-induced asthma    GERD (gastroesophageal reflux disease)    Hx of colonic polyps 03/2020   Hypertension 09/27/2017   Hypothyroidism    Sarcoidosis    Shortness of breath 11/08/2012   RECENTLY  & WHEN i EXERCISE   Solar lentigo 03/11/2008   Back - Atypical   Thyroid  disease    Type 2 diabetes with complication  (HCC) 08/22/2018   Upper airway cough syndrome    Past Surgical History:  Procedure Laterality Date   ABDOMINAL HYSTERECTOMY     Partial   ESOPHAGOGASTRODUODENOSCOPY (EGD) WITH ESOPHAGEAL DILATION N/A 11/10/2012   Procedure: ESOPHAGOGASTRODUODENOSCOPY (EGD) WITH ESOPHAGEAL DILATION;  Surgeon: Lupita FORBES Commander, MD;  Location: [facility] ENDOSCOPY;  Service: Endoscopy;  Laterality: N/A;  Maloney vs. Balloon   VIDEO BRONCHOSCOPY WITH ENDOBRONCHIAL ULTRASOUND Bilateral 01/15/2015   Procedure: VIDEO BRONCHOSCOPY WITH ENDOBRONCHIAL ULTRASOUND of  LEFT LUNG LOWER LOBE and right lung;  Surgeon: Lamar GORMAN Chris, MD;  Location: [facility] OR;  Service: Thoracic;  Laterality: Bilateral;   Family History  Problem Relation Age of Onset   Diabetes Mother    Stroke Mother    Coronary artery disease Father    Coronary artery disease Brother    Hypothyroidism Maternal Grandmother    Diabetes Maternal Grandfather    Heart disease Paternal Grandmother    Heart disease Paternal Grandfather    Social History   Socioeconomic History   Marital status: Divorced    Spouse name: Not on file   Number of children: 3   Years of education: 14   Highest education level: Not on file  Occupational History   Occupation: Unemployed   Occupation: RETIRED  Tobacco Use   Smoking status: Never   Smokeless tobacco: Never  Vaping Use   Vaping status: Never Used  Substance and Sexual Activity   Alcohol use: No   Drug use: No   Sexual activity: Not on file  Other Topics Concern   Not on file  Social History Narrative   Fun: Anything and everything   Lives alone/2025   Social Drivers of Health   Financial Resource Strain: Low Risk  (12/14/2022)   Overall Financial Resource Strain (CARDIA)    Difficulty of Paying Living Expenses: Not hard at all  Food Insecurity: No Food Insecurity (12/14/2022)   Hunger Vital Sign    Worried About Running Out of Food in the Last Year: Never true    Ran Out of Food in the Last Year: Never true   Transportation Needs: No Transportation Needs (12/15/2023)   PRAPARE - Administrator, Civil Service (Medical): No    Lack of Transportation (Non-Medical): No  Physical Activity: Sufficiently Active (12/15/2023)   Exercise Vital Sign    Days of Exercise per Week: 7 days    Minutes of Exercise per Session: 30 min  Stress: No Stress Concern Present (12/15/2023)   [employer] of Occupational Health - Occupational Stress Questionnaire    Feeling of Stress: Not at all  Social Connections: Moderately Integrated (12/15/2023)   Social Connection and Isolation Panel    Frequency of Communication with Friends and Family: More than three times a week    Frequency of Social Gatherings with Friends and Family: More than three times a week    Attends Religious Services: More than 4 times per year    Active Member of [employer] or Organizations: Yes    Attends Banker Meetings: Never  Marital Status: Divorced    Tobacco Counseling Counseling given: Not Answered    Clinical Intake:  Pre-visit preparation completed: Yes  Pain : No/denies pain     BMI - recorded: 33.3 Nutritional Status: BMI > 30  Obese Nutritional Risks: None Diabetes: Yes CBG done?: No Did pt. bring in CBG monitor from home?: No  Lab Results  Component Value Date   HGBA1C 6.0 09/20/2023   HGBA1C 6.4 12/10/2022   HGBA1C 7.8 (H) 09/07/2022     How often do you need to have someone help you when you read instructions, pamphlets, or other written materials from your doctor or pharmacy?: 1 - Never  Interpreter Needed?: No  Information entered by :: Lawrnce Reyez, RMA   Activities of Daily Living     12/15/2023    9:33 AM  In your present state of health, do you have any difficulty performing the following activities:  Hearing? 0  Vision? 0  Difficulty concentrating or making decisions? 0  Walking or climbing stairs? 0  Dressing or bathing? 0  Doing errands, shopping? 0  Preparing  Food and eating ? N  Using the Toilet? N  In the past six months, have you accidently leaked urine? N  Do you have problems with loss of bowel control? N  Managing your Medications? N  Managing your Finances? N  Housekeeping or managing your Housekeeping? N    Patient Care Team: Rollene Almarie LABOR, MD as PCP - General (Internal Medicine) Mona Vinie BROCKS, MD as PCP - Cardiology (Cardiology) Latisha Arch, OD as Consulting Physician (Optometry) Cleatus Collar, MD as Consulting Physician (Ophthalmology)  I have updated your Care Teams any recent Medical Services you may have received from other providers in the past year.     Assessment:   This is a routine wellness examination for Katrina [name].  Hearing/Vision screen Hearing Screening - Comments:: Denies hearing difficulties   Vision Screening - Comments:: Wears eyeglasses/ Hecker   Goals Addressed             This Visit's Progress    Patient Stated: Continue to work on losing weight and controlling my glucose numbers.   On track      Depression Screen     12/15/2023    9:42 AM 01/17/2023   10:07 AM 12/14/2022   10:06 AM 12/10/2022   10:28 AM 06/15/2022    2:28 PM 05/28/2022    1:42 PM 04/23/2022   10:54 AM  PHQ 2/9 Scores  PHQ - 2 Score 0 0 0 0 0 0 0  PHQ- 9 Score 0  0 0 0 0 0    Fall Risk     12/15/2023    9:40 AM 12/29/2022   11:07 AM 12/14/2022   10:06 AM 12/10/2022   10:28 AM 09/07/2022    9:48 AM  Fall Risk   Falls in the past year? 0 0 0 0 0  Number falls in past yr: 0 0 0 0 0  Injury with Fall? 0 0 0 0   Risk for fall due to :   No Fall Risks No Fall Risks   Follow up Falls evaluation completed;Falls prevention discussed Falls evaluation completed Falls prevention discussed Falls evaluation completed     MEDICARE RISK AT HOME:  Medicare Risk at Home Any stairs in or around the home?: No If so, are there any without handrails?: No Home free of loose throw rugs in walkways, pet beds, electrical cords, etc?:  Yes Adequate lighting in  your home to reduce risk of falls?: Yes Life alert?: No Use of a cane, walker or w/c?: No Grab bars in the bathroom?: No Shower chair or bench in shower?: No Elevated toilet seat or a handicapped toilet?: No  TIMED UP AND GO:  Was the test performed?  No  Cognitive Function: Declined/Normal: No cognitive concerns noted by patient or family. Patient alert, oriented, able to answer questions appropriately and recall recent events. No signs of memory loss or confusion.        12/14/2022   10:07 AM 01/01/2022    3:50 PM  6CIT Screen  What Year? 0 points 0 points  What month? 0 points 0 points  What time? 0 points 0 points  Count back from 20 0 points 0 points  Months in reverse 0 points 0 points  Repeat phrase 0 points 0 points  Total Score 0 points 0 points    Immunizations Immunization History  Administered Date(s) Administered    sv, Bivalent, Protein Subunit Rsvpref,pf Marlow) 03/07/2023   Moderna SARS-COV2 Booster Vaccination 03/29/2020   PFIZER(Purple Top)SARS-COV-2 Vaccination 05/10/2019, 05/31/2019   Pfizer(Comirnaty)Fall Seasonal Vaccine 12 years and older 03/07/2023   Tdap 03/07/2023    Screening Tests Health Maintenance  Topic Date Due   Pneumococcal Vaccine: 50+ Years (1 of 2 - PCV) Never done   Zoster Vaccines- Shingrix (1 of 2) Never done   DEXA SCAN  Never done   FOOT EXAM  04/24/2023   COVID-19 Vaccine (4 - Pfizer risk 2024-25 season) 09/04/2023   INFLUENZA VACCINE  11/18/2023   HEMOGLOBIN A1C  03/21/2024   OPHTHALMOLOGY EXAM  05/04/2024   Diabetic kidney evaluation - eGFR measurement  09/19/2024   Diabetic kidney evaluation - Urine ACR  09/19/2024   Medicare Annual Wellness (AWV)  12/14/2024   MAMMOGRAM  05/30/2025   Colonoscopy  04/03/2027   DTaP/Tdap/Td (2 - Td or Tdap) 03/06/2033   Hepatitis C Screening  Completed   HPV VACCINES  Aged Out   Meningococcal B Vaccine  Aged Out    Health Maintenance  Health  Maintenance Due  Topic Date Due   Pneumococcal Vaccine: 50+ Years (1 of 2 - PCV) Never done   Zoster Vaccines- Shingrix (1 of 2) Never done   DEXA SCAN  Never done   FOOT EXAM  04/24/2023   COVID-19 Vaccine (4 - Pfizer risk 2024-25 season) 09/04/2023   INFLUENZA VACCINE  11/18/2023   Health Maintenance Items Addressed: DEXA ordered, Diabetic Foot Exam recommended, See Nurse Notes at the end of this note  Additional Screening:  Vision Screening: Recommended annual ophthalmology exams for early detection of glaucoma and other disorders of the eye. Would you like a referral to an eye doctor? No    Dental Screening: Recommended annual dental exams for proper oral hygiene  Community Resource Referral / Chronic Care Management: CRR required this visit?  No   CCM required this visit?  No   Plan:    I have personally reviewed and noted the following in the patient's chart:   Medical and social history Use of alcohol, tobacco or illicit drugs  Current medications and supplements including opioid prescriptions. Patient is not currently taking opioid prescriptions. Functional ability and status Nutritional status Physical activity Advanced directives List of other physicians Hospitalizations, surgeries, and ER visits in previous 12 months Vitals Screenings to include cognitive, depression, and falls Referrals and appointments  In addition, I have reviewed and discussed with patient certain preventive protocols, quality metrics, and best  practice recommendations. A written personalized care plan for preventive services as well as general preventive health recommendations were provided to patient.   Malikhi Ogan L Baylin Cabal, CMA   12/15/2023   After Visit Summary: (MyChart) Due to this being a telephonic visit, the after visit summary with patients personalized plan was offered to patient via MyChart   Notes: Patient is due for a pneumonia vaccine and a Shingrix vaccine.  She is also due  for a DEXA, which order has been placed today.  Patient is due for a foot exam and can get that done during her next office visit.  She had no other concerns to address today.

## 2023-12-27 ENCOUNTER — Other Ambulatory Visit: Payer: Self-pay | Admitting: Internal Medicine

## 2024-01-02 ENCOUNTER — Ambulatory Visit (INDEPENDENT_AMBULATORY_CARE_PROVIDER_SITE_OTHER): Admitting: Family Medicine

## 2024-01-02 ENCOUNTER — Ambulatory Visit: Payer: Self-pay

## 2024-01-02 ENCOUNTER — Encounter: Payer: Self-pay | Admitting: Family Medicine

## 2024-01-02 VITALS — BP 152/88 | HR 58 | Temp 97.8°F | Resp 20 | Ht 62.5 in | Wt 187.0 lb

## 2024-01-02 DIAGNOSIS — M19041 Primary osteoarthritis, right hand: Secondary | ICD-10-CM | POA: Diagnosis not present

## 2024-01-02 DIAGNOSIS — M7918 Myalgia, other site: Secondary | ICD-10-CM

## 2024-01-02 DIAGNOSIS — M19042 Primary osteoarthritis, left hand: Secondary | ICD-10-CM

## 2024-01-02 DIAGNOSIS — I10 Essential (primary) hypertension: Secondary | ICD-10-CM

## 2024-01-02 MED ORDER — METHOCARBAMOL 500 MG PO TABS: 500.0000 mg | ORAL_TABLET | Freq: Three times a day (TID) | ORAL | 0 refills | Status: DC | PRN

## 2024-01-02 MED ORDER — PREDNISONE 10 MG (21) PO TBPK: ORAL_TABLET | ORAL | 0 refills | Status: DC
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
## 2024-01-02 NOTE — Telephone Encounter (Signed)
 FYI Only or Action Required?: FYI only for provider.  Patient was last seen in primary care on 09/20/2023 by Katrina Debby CROME, MD.  Called Nurse Triage reporting Shoulder Pain.  Symptoms began a week ago.  Interventions attempted: OTC medications: Tylenol  .  Symptoms are: gradually worsening.  Triage Disposition: See Physician Within 24 Hours  Patient/caregiver understands and will follow disposition?: Yes  **Appt. Scheduled for 9/15**      Copied from CRM #[id number]. Topic: Clinical - Red Word Triage >> Jan 02, 2024  8:23 AM Katrina [name] wrote: Reason for [id number]:[id number] shoulder pain down to her arm and hand for a week Reason for Disposition  Numbness (i.e., loss of sensation) in hand or fingers  Answer Assessment - Initial Assessment Questions 1. ONSET: When did the pain start?     X 1 week   2. LOCATION: Where is the pain located?    right shoulder, right arm and hand    3. PAIN: How bad is the pain? (Scale 1-10; or mild, moderate, severe)     Severe  4. WORK OR EXERCISE: Has there been any recent work or exercise that involved this part of the body?       5. CAUSE: What do you think is causing the shoulder pain?     Moving heavy objects last week   6. OTHER SYMPTOMS: Do you have any other symptoms? (e.g., neck pain, swelling, rash, fever, numbness, weakness)    Right hand feel mildly numb.   For pain patient is taking Advil and Voltaren.  Protocols used: Shoulder Pain-A-AH

## 2024-01-02 NOTE — Assessment & Plan Note (Signed)
 Elevated blood pressure at 152/88 mmHg, possibly due to pain, stress, NSAIDs, and increased physical exertion. - Recommend home blood pressure cuff purchase for monitoring. - Advise daily or alternate day blood pressure checks at varied times. - Schedule follow-up in four weeks with blood pressure log.  - Education provided on hypertension.

## 2024-01-11 ENCOUNTER — Emergency Department (HOSPITAL_BASED_OUTPATIENT_CLINIC_OR_DEPARTMENT_OTHER)
Admission: EM | Admit: 2024-01-11 | Discharge: 2024-01-11 | Disposition: A | Discharge status: Home / Self Care | Attending: Emergency Medicine | Admitting: Emergency Medicine

## 2024-01-11 ENCOUNTER — Ambulatory Visit: Payer: Self-pay

## 2024-01-11 ENCOUNTER — Encounter (HOSPITAL_BASED_OUTPATIENT_CLINIC_OR_DEPARTMENT_OTHER): Payer: Self-pay | Admitting: Emergency Medicine

## 2024-01-11 ENCOUNTER — Other Ambulatory Visit: Payer: Self-pay

## 2024-01-11 ENCOUNTER — Emergency Department (HOSPITAL_BASED_OUTPATIENT_CLINIC_OR_DEPARTMENT_OTHER)

## 2024-01-11 DIAGNOSIS — I700 Atherosclerosis of aorta: Secondary | ICD-10-CM | POA: Diagnosis not present

## 2024-01-11 DIAGNOSIS — R0602 Shortness of breath: Secondary | ICD-10-CM | POA: Insufficient documentation

## 2024-01-11 DIAGNOSIS — R079 Chest pain, unspecified: Secondary | ICD-10-CM | POA: Diagnosis not present

## 2024-01-11 DIAGNOSIS — I771 Stricture of artery: Secondary | ICD-10-CM | POA: Diagnosis not present

## 2024-01-11 DIAGNOSIS — R002 Palpitations: Secondary | ICD-10-CM | POA: Diagnosis not present

## 2024-01-11 DIAGNOSIS — R0789 Other chest pain: Secondary | ICD-10-CM | POA: Insufficient documentation

## 2024-01-11 DIAGNOSIS — I517 Cardiomegaly: Secondary | ICD-10-CM | POA: Diagnosis not present

## 2024-01-11 LAB — BASIC METABOLIC PANEL WITH GFR
Anion gap: 13 (ref 5–15)
BUN: 13 mg/dL (ref 8–23)
CO2: 23 mmol/L (ref 22–32)
Calcium: 9.3 mg/dL (ref 8.9–10.3)
Chloride: 102 mmol/L (ref 98–111)
Creatinine, Ser: 0.71 mg/dL (ref 0.44–1.00)
GFR, Estimated: >60 mL/min (ref >60)
Glucose, Bld: 117 mg/dL — ABNORMAL HIGH (ref 70–99)
Potassium: 4.5 mmol/L (ref 3.5–5.1)
Sodium: 138 mmol/L (ref 135–145)

## 2024-01-11 LAB — CBC WITH DIFFERENTIAL/PLATELET
Abs Immature Granulocytes: 0.04 K/uL (ref 0.00–0.07)
Basophils Absolute: 0 K/uL (ref 0.0–0.1)
Basophils Relative: 0 %
Eosinophils Absolute: 0 K/uL (ref 0.0–0.5)
Eosinophils Relative: 0 %
HCT: 45.8 % (ref 36.0–46.0)
Hemoglobin: 15.5 g/dL — ABNORMAL HIGH (ref 12.0–15.0)
Immature Granulocytes: 0 %
Lymphocytes Relative: 22 %
Lymphs Abs: 2.1 K/uL (ref 0.7–4.0)
MCH: 27.8 pg (ref 26.0–34.0)
MCHC: 33.8 g/dL (ref 30.0–36.0)
MCV: 82.2 fL (ref 80.0–100.0)
Monocytes Absolute: 0.7 K/uL (ref 0.1–1.0)
Monocytes Relative: 7 %
Neutro Abs: 6.6 K/uL (ref 1.7–7.7)
Neutrophils Relative %: 71 %
Platelets: 292 K/uL (ref 150–400)
RBC: 5.57 MIL/uL — ABNORMAL HIGH (ref 3.87–5.11)
RDW: 13.3 % (ref 11.5–15.5)
WBC: 9.4 K/uL (ref 4.0–10.5)
nRBC: 0 % (ref 0.0–0.2)

## 2024-01-11 LAB — D-DIMER, QUANTITATIVE: D-Dimer, Quant: <0.27 ug{FEU}/mL (ref 0.00–0.50)

## 2024-01-11 LAB — TROPONIN T, HIGH SENSITIVITY: Troponin T High Sensitivity: <15 ng/L (ref 0–19)

## 2024-01-11 MED ORDER — METOPROLOL TARTRATE 25 MG PO TABS: 25.0000 mg | ORAL_TABLET | Freq: Two times a day (BID) | ORAL | 0 refills | Status: DC

## 2024-01-11 NOTE — Telephone Encounter (Signed)
 FYI Only or Action Required?: Action required by provider: request for appointment.  Patient was last seen in primary care on 01/02/2024 by Merlynn Niki FALCON, FNP.  Called Nurse Triage reporting Shortness of Breath.  Symptoms began several days ago.  Interventions attempted: Nothing.  Symptoms are: unchanged.  Triage Disposition: See HCP Within 4 Hours (Or PCP Triage)  Patient/caregiver understands and will follow disposition?: Yes   Copied from CRM #[id number]. Topic: Clinical - Red Word Triage >> Jan 11, 2024 12:23 PM Pinkey ORN wrote: Red Word that prompted transfer to Nurse Triage: Shortness Of Breath >> Jan 11, 2024 12:24 PM Pinkey ORN wrote: Patient states she's experiencing some shortness of breath. Patient has asthma and having a difficult time.  Answer Assessment - Initial Assessment Questions 1. RESPIRATORY STATUS: Describe your breathing? (e.g., wheezing, shortness of breath, unable to speak, severe coughing)      SOB 2. ONSET: When did this breathing problem begin?      2-3 Days 3. PATTERN Does the difficult breathing come and go, or has it been constant since it started?      Comes and goes 4. SEVERITY: How bad is your breathing? (e.g., mild, moderate, severe)      moderate 5. RECURRENT SYMPTOM: Have you had difficulty breathing before? If Yes, ask: When was the last time? and What happened that time?      yes 6. CARDIAC HISTORY: Do you have any history of heart disease? (e.g., heart attack, angina, bypass surgery, angioplasty)      no 7. LUNG HISTORY: Do you have any history of lung disease?  (e.g., pulmonary embolus, asthma, emphysema)     asthma 8. CAUSE: What do you think is causing the breathing problem?       9. OTHER SYMPTOMS: Do you have any other symptoms? (e.g., chest pain, cough, dizziness, fever, runny nose)      10. O2 SATURATION MONITOR:  Do you use an oxygen  saturation monitor (pulse oximeter) at home? If Yes, ask: What is  your reading (oxygen  level) today? What is your usual oxygen  saturation reading? (e.g., 95%)       N/a 11. PREGNANCY: Is there any chance you are pregnant? When was your last menstrual period?       no 12. TRAVEL: Have you traveled out of the country in the last month? (e.g., travel history, exposures)       no  Protocols used: Breathing Difficulty-A-AH  Reason for Disposition  [1] MILD difficulty breathing (e.g., minimal/no SOB at rest, SOB with walking, pulse < 100) AND [2] NEW-onset or WORSE than normal  Answer Assessment - Initial Assessment Questions 1. RESPIRATORY STATUS: Describe your breathing? (e.g., wheezing, shortness of breath, unable to speak, severe coughing)      SOB 2. ONSET: When did this breathing problem begin?      2-3 Days 3. PATTERN Does the difficult breathing come and go, or has it been constant since it started?      Comes and goes 4. SEVERITY: How bad is your breathing? (e.g., mild, moderate, severe)      moderate 5. RECURRENT SYMPTOM: Have you had difficulty breathing before? If Yes, ask: When was the last time? and What happened that time?      yes 6. CARDIAC HISTORY: Do you have any history of heart disease? (e.g., heart attack, angina, bypass surgery, angioplasty)      no 7. LUNG HISTORY: Do you have any history of lung disease?  (e.g., pulmonary embolus, asthma, emphysema)  asthma 8. CAUSE: What do you think is causing the breathing problem?       9. OTHER SYMPTOMS: Do you have any other symptoms? (e.g., chest pain, cough, dizziness, fever, runny nose)      10. O2 SATURATION MONITOR:  Do you use an oxygen  saturation monitor (pulse oximeter) at home? If Yes, ask: What is your reading (oxygen  level) today? What is your usual oxygen  saturation reading? (e.g., 95%)       N/a 11. PREGNANCY: Is there any chance you are pregnant? When was your last menstrual period?       no 12. TRAVEL: Have you traveled out of  the country in the last month? (e.g., travel history, exposures)       no  Protocols used: Breathing Difficulty-A-AH
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
## 2024-01-11 NOTE — Discharge Instructions (Addendum)
 Lab work and EKG were reassuring today.  It was recommended by cardiology to start you on metoprolol  which you will take 1 pill twice daily.  If you do not hear from cardiology in the next day or so I want you to call them for further evaluation.  Monitor for worsening symptoms including worsening chest pain or shortness of breath dizziness weakness or fainting.  If symptoms worsen return to ED for further evaluation.
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
## 2024-01-12 ENCOUNTER — Ambulatory Visit: Admitting: Family Medicine

## 2024-01-12 NOTE — Progress Notes (Deleted)
   Acute Office Visit  Subjective:     Patient ID: Katrina [name], female    DOB: [birth year], 68 y.o.   MRN: [id number]  No chief complaint on file.   HPI  Discussed the use of AI scribe software for clinical note transcription with the patient, who gave verbal consent to proceed.  History of Present Illness      ROS Per HPI      Objective:    There were no vitals taken for this visit.   Physical Exam Vitals and nursing note reviewed.  Constitutional:      General: She is not in acute distress.    Appearance: Normal appearance. She is normal weight.  HENT:     Head: Normocephalic and atraumatic.     Right Ear: External ear normal.     Left Ear: External ear normal.     Nose: Nose normal.     Mouth/Throat:     Mouth: Mucous membranes are moist.     Pharynx: Oropharynx is clear.  Eyes:     Extraocular Movements: Extraocular movements intact.     Pupils: Pupils are equal, round, and reactive to light.  Cardiovascular:     Rate and Rhythm: Normal rate and regular rhythm.     Pulses: Normal pulses.     Heart sounds: Normal heart sounds.  Pulmonary:     Effort: Pulmonary effort is normal. No respiratory distress.     Breath sounds: Normal breath sounds. No wheezing, rhonchi or rales.  Musculoskeletal:        General: Normal range of motion.     Cervical back: Normal range of motion.     Right lower leg: No edema.     Left lower leg: No edema.  Lymphadenopathy:     Cervical: No cervical adenopathy.  Neurological:     General: No focal deficit present.     Mental Status: She is alert and oriented to person, place, and time.  Psychiatric:        Mood and Affect: Mood normal.        Thought Content: Thought content normal.     No results found for any visits on 01/12/24.      Assessment & Plan:   Assessment and Plan Assessment & Plan      No orders of the defined types were placed in this encounter.    No orders of the defined types were placed  in this encounter.   No follow-ups on file.  Corean LITTIE Ku, FNP

## 2024-01-23 ENCOUNTER — Telehealth: Payer: Self-pay

## 2024-01-23 ENCOUNTER — Telehealth: Payer: Self-pay | Admitting: Internal Medicine

## 2024-01-23 NOTE — Telephone Encounter (Signed)
 Patient c/o Palpitations: STAT if patient c/o lightheadedness, shortness of breath, or chest pain  How long have you had palpitations/irregular HR/ Afib? Are you having the symptoms now?  Patient says she gets palpitations  when walking up stairs. She says she is starting to feet palpitations now.  Are you currently experiencing lightheadedness, SOB or CP?  No   Do you have a history of afib (atrial fibrillation) or irregular heart rhythm?  No  Have you checked your BP or HR? (document readings if available):  BP   Are you experiencing any other symptoms?  Chest discomfort

## 2024-01-23 NOTE — Telephone Encounter (Signed)
 Called patient back and left a voicemail with the cardiologist number for her to call

## 2024-01-23 NOTE — Telephone Encounter (Signed)
 Spoke with pt regarding her palpitations. Pt stated that she feels like her heart starts pounding when she does any activity such as going up the stairs. Pt stated once it starts it continues for a while, sometimes through the night and then gets better in the morning. Pt stated her heart rate has been between 70 and 80. Pt stated she went to the ED last week for chest discomfort and they found nothing to be wrong. They recommended a cardiologist who suggested a beta blocker, Metoprolol  Tartrate, but pt is hesitant to start it because she has asthma. Pt stated she has also been very fatigued and sob with activity. Pt did not have any blood pressures or heart rates to share. Pt mentioned some nights she has left arm pain. Pt was given ED precautions. Pt was made an appointment with Dr. Floretta DOD tomorrow 10/7. Pt verbalized understanding. All questions if any were answered.

## 2024-01-23 NOTE — Telephone Encounter (Signed)
 Copied from CRM [id number]. Topic: General - Other >> Jan 23, 2024  9:04 AM Katrina [name] wrote: Reason for CRM: pt asked for a cb to see what cardiologist will be calling her. [phone]

## 2024-01-24 ENCOUNTER — Ambulatory Visit

## 2024-01-24 ENCOUNTER — Other Ambulatory Visit (HOSPITAL_COMMUNITY): Payer: Self-pay

## 2024-01-24 ENCOUNTER — Ambulatory Visit
Attending: Student in an Organized Health Care Education/Training Program | Admitting: Student in an Organized Health Care Education/Training Program

## 2024-01-24 ENCOUNTER — Encounter: Payer: Self-pay | Admitting: Student in an Organized Health Care Education/Training Program

## 2024-01-24 VITALS — BP 172/102 | HR 64 | Ht 62.0 in | Wt 187.4 lb

## 2024-01-24 DIAGNOSIS — E785 Hyperlipidemia, unspecified: Secondary | ICD-10-CM

## 2024-01-24 DIAGNOSIS — I10 Essential (primary) hypertension: Secondary | ICD-10-CM

## 2024-01-24 DIAGNOSIS — R002 Palpitations: Secondary | ICD-10-CM

## 2024-01-24 DIAGNOSIS — Z79899 Other long term (current) drug therapy: Secondary | ICD-10-CM | POA: Diagnosis not present

## 2024-01-24 MED ORDER — BLOOD PRESSURE MONITOR AUTOMAT DEVI
1.0000 [IU] | Freq: Once | 0 refills | Status: AC
Start: 2024-01-24 — End: 2024-01-24
  Filled 2024-01-24: qty 1, 30d supply, fill #0

## 2024-01-24 MED ORDER — HYDROCHLOROTHIAZIDE 12.5 MG PO CAPS: 12.5000 mg | ORAL_CAPSULE | Freq: Every day | ORAL | 3 refills | Status: AC

## 2024-01-24 NOTE — Assessment & Plan Note (Addendum)
 Her blood pressure is not controlled despite adherence to her antihypertensives..  I counseled the patient that her goal blood pressure is less than 130/80.  I will start HCTZ 12.5 mg daily and also prescribe a blood pressure cuff for home monitoring. -Continue losartan  100 mg daily -Start HCTZ 12.5 mg daily -Home blood pressure cuff -Follow-up with primary cardiologist - BMP in 1 week to check potassium

## 2024-01-24 NOTE — Patient Instructions (Addendum)
 Medication Instructions:  Your physician has recommended you make the following change in your medication:  START: Hydrochlorothiazide  12.5 mg once daily  *If you need a refill on your cardiac medications before your next appointment, please call your pharmacy*  Lab Work: TODAY: Lipids, Lp(a) In 1 week: BMET If you have labs (blood work) drawn today and your tests are completely normal, you will receive your results only by: MyChart Message (if you have MyChart) OR A paper copy in the mail If you have any lab test that is abnormal or we need to change your treatment, we will call you to review the results.  Testing/Procedures: Katrina [name]- Long Term Monitor Instructions  Your physician has requested you wear a ZIO patch monitor for 3 days.  This is a single patch monitor. [employer] supplies one patch monitor per enrollment. Additional stickers are not available. Please do not apply patch if you will be having a Nuclear Stress Test,  Echocardiogram, Cardiac CT, MRI, or Chest Xray during the period you would be wearing the  monitor. The patch cannot be worn during these tests. You cannot remove and re-apply the  ZIO XT patch monitor.  Your ZIO patch monitor will be mailed 3 day USPS to your address on file. It may take 3-5 days  to receive your monitor after you have been enrolled.  Once you have received your monitor, please review the enclosed instructions. Your monitor  has already been registered assigning a specific monitor serial # to you.  Billing and Patient Assistance Program Information  We have supplied [employer] with any of your insurance information on file for billing purposes. [employer] offers a sliding scale Patient Assistance Program for patients that do not have  insurance, or whose insurance does not completely cover the cost of the ZIO monitor.  You must apply for the Patient Assistance Program to qualify for this discounted rate.  To apply, please call [employer] at  [phone], select option 4, select option 2, ask to apply for  Patient Assistance Program. Katrina [name] will ask your household income, and how many people  are in your household. They will quote your out-of-pocket cost based on that information.  [employer] will also be able to set up a 48-month, interest-free payment plan if needed.  Applying the monitor   Shave hair from upper left chest.  Hold abrader disc by orange tab. Rub abrader in 40 strokes over the upper left chest as  indicated in your monitor instructions.  Clean area with 4 enclosed alcohol pads. Let dry.  Apply patch as indicated in monitor instructions. Patch will be placed under collarbone on left  side of chest with arrow pointing upward.  Rub patch adhesive wings for 2 minutes. Remove white label marked 1. Remove the white  label marked 2. Rub patch adhesive wings for 2 additional minutes.  While looking in a mirror, press and release button in center of patch. A small green light will  flash 3-4 times. This will be your only indicator that the monitor has been turned on.  Do not shower for the first 24 hours. You may shower after the first 24 hours.  Press the button if you feel a symptom. You will hear a small click. Record Date, Time and  Symptom in the Patient Logbook.  When you are ready to remove the patch, follow instructions on the last 2 pages of Patient  Logbook. Stick patch monitor onto the last page of Patient Logbook.  Place Patient Logbook in  the blue and white box. Use locking tab on box and tape box closed  securely. The blue and white box has prepaid postage on it. Please place it in the mailbox as  soon as possible. Your physician should have your test results approximately 7 days after the  monitor has been mailed back to [facility].  Call [facility] Customer Care at [phone] if you have questions regarding  your ZIO XT patch monitor. Call them immediately if you see an orange light  blinking on your  monitor.  If your monitor falls off in less than 4 days, contact our Monitor department at [phone].  If your monitor becomes loose or falls off after 4 days call [employer] at [phone] for  suggestions on securing your monitor   Follow-Up: At [facility], you and your health needs are our priority.  As part of our continuing mission to provide you with exceptional heart care, our providers are all part of one team.  This team includes your primary Cardiologist (physician) and Advanced Practice Providers or APPs (Physician Assistants and Nurse Practitioners) who all work together to provide you with the care you need, when you need it.  Your next appointment:   3 month(s)  Provider:   Vinie JAYSON Maxcy, MD

## 2024-01-24 NOTE — Progress Notes (Addendum)
 Cardiology Office Note:   Date:  01/24/2024  ID:  Katrina [name], DOB 01/31/1956, MRN [id number] PCP: Rollene Almarie LABOR, MD  [facility] Providers Cardiologist:  Vinie JAYSON Maxcy, MD { Chief Complaint:  Chief Complaint  Patient presents with   Palpitations      History of Present Illness:   Katrina [name] is a 68 y.o. female with a PMH of HTN, HLD, DM2, obesity, hypothyroidism who presents as an acute visit for the evaluation of chest pain and palpitations.  Patient reports that she has been having symptoms of palpitation for the past 1 month.  She recently moved to a new apartment where she has to climb 3 flights of steps in order to get to her room.  After climbing 3 flights of steps she notices that her heart is pounding out of her chest.  When she finally is at her apartment and rest, still feels like her heart is racing and her symptoms will last all throughout the night and even through the next morning.  She is very physically active playing pickle ball, tennis, and walking and she never gets the symptoms.  Only when she climbs flights of steps at her apartment.  She has some associated chest heaviness but it does not happen every single time that she walks up 5 steps.  No exertional chest pain outside of the steps.  She also reports having some left arm pain that is worse with movement and palpation.  She has been lifting a lot of heavy boxes as a part of her move, but is not sure if this is MSK or her heart.  Her family history is notable for a father who had CABG and 2 brothers who had CAD.  She denies tobacco, alcohol, illicit drug use.  She does not check her blood pressures at home. Patient recently presented to the ED on 9/24 for her symptoms and workup was unremarkable.  Past Medical History:  Diagnosis Date   Asthma 09/27/2021   BCC (basal cell carcinoma of skin) Infundibulocystic 03/11/2008   Inner Left Eye   Chest pain at rest 11/07/2012   Diabetes mellitus  without complication (HCC)    Eosinophilic esophagitis 11/10/2012   Esophageal stricture    Exercise-induced asthma    GERD (gastroesophageal reflux disease)    Hx of colonic polyps 03/2020   Hypertension 09/27/2017   Hypothyroidism    Sarcoidosis    Shortness of breath 11/08/2012   RECENTLY  & WHEN i EXERCISE   Solar lentigo 03/11/2008   Back - Atypical   Thyroid  disease    Type 2 diabetes with complication (HCC) 08/22/2018   Upper airway cough syndrome      Studies Reviewed:    EKG: I independently reviewed the EKG from 01/11/2024.  It shows normal sinus rhythm and is a normal EKG.       Cardiac Studies & Procedures   ______________________________________________________________________________________________     ECHOCARDIOGRAM  PCV ECHOCARDIOGRAM COMPLETE 02/16/2019  Narrative Echocardiogram 02/16/2019: Left ventricle cavity is normal in size. Normal left ventricular wall thickness. Normal LV systolic function with EF 69%. Normal global wall motion. Normal diastolic filling pattern. Mild tricuspid regurgitation. Estimated pulmonary artery systolic pressure is 30 mmHg.      CT SCANS  CT CORONARY MORPH W/CTA COR W/SCORE 12/10/2020  Addendum 12/10/2020  3:25 PM ADDENDUM REPORT: 12/10/2020 15:22  CLINICAL DATA:  Chest pain  EXAM: Cardiac CTA  MEDICATIONS: Sub lingual nitro. 4mg  x 2  TECHNIQUE: The patient  was scanned on a Siemens 192 slice scanner. Gantry rotation speed was 250 msecs. Collimation was 0.6 mm. A 100 kV prospective scan was triggered in the ascending thoracic aorta at 35-75% of the R-R interval. Average HR during the scan was 60 bpm. The 3D data set was interpreted on a dedicated work station using MPR, MIP and VRT modes. A total of 80cc of contrast was used.  FINDINGS: Non-cardiac: See separate report from [facility].  Pulmonary veins drain normally to the left atrium. No LA appendage thrombus.  Calcium  Score: 0 Agatston  units.  Coronary Arteries: Right dominant with no anomalies  LM: No plaque or stenosis.  LAD system: No plaque or stenosis.  Circumflex system: No plaque or stenosis.  RCA system: No plaque or stenosis.  IMPRESSION: 1. Coronary artery calcium  score 0 Agatston units. This suggests low risk for future cardiac events.  2.  No significant coronary disease noted.  Dalton Mclean   Electronically Signed By: Ezra Shuck M.D. On: 12/10/2020 15:22  Narrative EXAM: OVER-READ INTERPRETATION  CT CHEST  The following report is an over-read performed by radiologist Dr. Toribio Aye of [facility] on 12/10/2020. This over-read does not include interpretation of cardiac or coronary anatomy or pathology. The coronary calcium  score/coronary CTA interpretation by the cardiologist is attached.  COMPARISON:  Chest CTA 04/30/2018.  FINDINGS: 4 mm subpleural nodule in the right middle lobe anteriorly (axial image 14 of series 16), stable compared to the prior study, considered benign (presumably a subpleural lymph node). Areas of linear scarring are again noted in the lower lobes of the lungs bilaterally. Within the visualized portions of the thorax there are no suspicious appearing pulmonary nodules or masses, there is no acute consolidative airspace disease, no pleural effusions, no pneumothorax and no lymphadenopathy. Visualized portions of the upper abdomen are unremarkable. There are no aggressive appearing lytic or blastic lesions noted in the visualized portions of the skeleton.  IMPRESSION: 1. No significant incidental noncardiac findings are noted.  Electronically Signed: By: Toribio Aye M.D. On: 12/10/2020 12:15     ______________________________________________________________________________________________      Risk Assessment/Calculations:          Physical Exam:     VS:  BP (!) 172/102 (BP Location: Left Arm, Patient Position:  Sitting, Cuff Size: Normal)   Pulse 64   Ht 5' 2 (1.575 m)   Wt 187 lb 6.4 oz (85 kg)   SpO2 95%   BMI 34.28 kg/m      Wt Readings from Last 3 Encounters:  01/11/24 187 lb (84.8 kg)  01/02/24 187 lb (84.8 kg)  12/15/23 185 lb (83.9 kg)     GEN: Well nourished, well developed, in no acute distress NECK: No JVD; No carotid bruits CARDIAC: RRR, no murmurs, rubs, gallops RESPIRATORY:  Clear to auscultation without rales, wheezing or rhonchi  ABDOMEN: Soft, non-tender, non-distended, normal bowel sounds EXTREMITIES:  Warm and well perfused, no edema; No deformity, 2+ radial pulses PSYCH: Normal mood and affect   Assessment & Plan Palpitations Patient presents with symptoms of palpitations when she climbs 3 flights of steps up to her apartment.  Interestingly her palpitations persist for many hours after she is at rest and that she does not get them when she exercises in other ways.  Her symptoms do not sound anginal such as CAD.  Furthermore, she had a coronary CTA in 2022 which had a CAC score = 0 and no evidence of CAD.  I think would  be unlikely to develop an obstructive lesion this quickly particularly when she has historically had lower LDLs.  I suspect that her palpitations are a perceived hyper awareness of her heartbeat in the setting of increased physical activity and perhaps an associated increase in her blood pressure (since her resting blood pressure is markedly elevated).  To rule out arrhythmia however, I will perform a 3-day event monitor to assess her heart rates.  I do not think additional testing is required at this time, but I will work on controlling her blood pressures as described below. -3-day event monitor - I recommended that the patient stop metoprolol  as there is no clear indication for it at this time  Primary hypertension Her blood pressure is not controlled despite adherence to her antihypertensives..  I counseled the patient that her goal blood pressure is  less than 130/80.  I will start HCTZ 12.5 mg daily and also prescribe a blood pressure cuff for home monitoring. -Continue losartan  100 mg daily -Start HCTZ 12.5 mg daily -Home blood pressure cuff -Follow-up with primary cardiologist - BMP in 1 week to check potassium  Hyperlipidemia, unspecified hyperlipidemia type She has a strong family history of CAD.  I think is worthwhile that we keep a close eye on her cholesterol and also check an LPA. - Lipid panel -LPA           This note was written with the assistance of a dictation microphone or AI dictation software. Please excuse any typos or grammatical errors.   Signed, Georganna Archer, MD 01/24/2024 12:47 PM    [facility]

## 2024-01-24 NOTE — Progress Notes (Unsigned)
 Enrolled for [employer] to mail a ZIO XT long term holter monitor to the patients address on file.

## 2024-01-25 ENCOUNTER — Ambulatory Visit: Payer: Self-pay | Admitting: Student in an Organized Health Care Education/Training Program

## 2024-01-25 LAB — LIPID PANEL
Chol/HDL Ratio: 5.1 ratio — ABNORMAL HIGH (ref 0.0–4.4)
Cholesterol, Total: 153 mg/dL (ref 100–199)
HDL: 30 mg/dL — ABNORMAL LOW (ref >39)
LDL Chol Calc (NIH): 83 mg/dL (ref 0–99)
Triglycerides: 239 mg/dL — ABNORMAL HIGH (ref 0–149)
VLDL Cholesterol Cal: 40 mg/dL (ref 5–40)

## 2024-01-25 LAB — LIPOPROTEIN A (LPA): Lipoprotein (a): 48.9 nmol/L (ref <75.0)

## 2024-01-30 ENCOUNTER — Ambulatory Visit: Admitting: Internal Medicine

## 2024-02-02 ENCOUNTER — Other Ambulatory Visit (HOSPITAL_COMMUNITY): Payer: Self-pay

## 2024-02-06 ENCOUNTER — Ambulatory Visit: Payer: Self-pay

## 2024-02-06 NOTE — Telephone Encounter (Signed)
 FYI Only or Action Required?: FYI only for provider.  Patient was last seen in primary care on 01/02/2024 by Merlynn Niki FALCON, FNP.  Called Nurse Triage reporting Headache.  Symptoms began a week ago.  Interventions attempted: OTC medications: Tylenol , Advil, pseudophed.  Symptoms are: unchanged.  Triage Disposition: See PCP When Office is Open (Within 3 Days)  Patient/caregiver understands and will follow disposition?: Yes  Copied from CRM #[id number]. Topic: Clinical - Red Word Triage >> Feb 06, 2024 10:22 AM Suzen RAMAN wrote: Red Word that prompted transfer to Nurse Triage: headache, not sure if its related to her BP; requesting adjustment to upcoming appt for later in the week if possible due to scheduling conflict Reason for Disposition  [1] MILD-MODERATE headache AND [2] present > 3 days (72 hours) AND [3] no improvement after using Care Advice  Answer Assessment - Initial Assessment Questions Pt with ongoing headache for 1 week. At first they thought it was related to her BP and made med adjustments. FU rescheduled from Tues to Thurs for sched conflict. Wants to discuss headache further at that appt.   Advil and a tylenol  together- barely helps. Pseudophed for sinus pain and pressure- mild relief but returned.  BP- Running 145/72 Feels like she needs her thyroid  checked since she lost weight on Mounjaro . Not eating consistent meals and not as hydrated as she could be.  Patient needing to get off the phone. ED/UC/Call back given and understood.   1. LOCATION: Where does it hurt?      All over but centered over eye brows 2. ONSET: When did the headache start? (e.g., minutes, hours, days)      7 days 3. PATTERN: Does the pain come and go, or has it been constant since it started?     Worsened with weather changes  4. SEVERITY: How bad is the pain? and What does it keep you from doing?  (e.g., Scale 1-10; mild, moderate, or severe)     NA- pt wanted off the call 5.  RECURRENT SYMPTOM: Have you ever had headaches before? If Yes, ask: When was the last time? and What happened that time?      Sometimes with weather changes 6. CAUSE: What do you think is causing the headache?     Mounjaro ? Hydration, skipping meals possibly 7. MIGRAINE: Have you been diagnosed with migraine headaches? If Yes, ask: Is this headache similar?      Possibly, might be weather related 8. HEAD INJURY: Has there been any recent injury to your head?      denies 9. OTHER SYMPTOMS: Do you have any other symptoms? (e.g., fever, stiff neck, eye pain, sore throat, cold symptoms)     Back of neck and over head, to behind her eyes  Protocols used: Headache-A-AH

## 2024-02-07 ENCOUNTER — Ambulatory Visit: Admitting: Internal Medicine

## 2024-02-07 NOTE — Progress Notes (Unsigned)
   Acute Office Visit  Subjective:     Patient ID: Katrina [name], female    DOB: 1956-01-09, 68 y.o.   MRN: [id number]  No chief complaint on file.   HPI  Discussed the use of AI scribe software for clinical note transcription with the patient, who gave verbal consent to proceed.  History of Present Illness      ROS Per HPI      Objective:    There were no vitals taken for this visit.   Physical Exam Vitals and nursing note reviewed.  Constitutional:      General: She is not in acute distress.    Appearance: Normal appearance. She is normal weight.  HENT:     Head: Normocephalic and atraumatic.     Right Ear: External ear normal.     Left Ear: External ear normal.     Nose: Nose normal.     Mouth/Throat:     Mouth: Mucous membranes are moist.     Pharynx: Oropharynx is clear.  Eyes:     Extraocular Movements: Extraocular movements intact.     Pupils: Pupils are equal, round, and reactive to light.  Cardiovascular:     Rate and Rhythm: Normal rate and regular rhythm.     Pulses: Normal pulses.     Heart sounds: Normal heart sounds.  Pulmonary:     Effort: Pulmonary effort is normal. No respiratory distress.     Breath sounds: Normal breath sounds. No wheezing, rhonchi or rales.  Musculoskeletal:        General: Normal range of motion.     Cervical back: Normal range of motion.     Right lower leg: No edema.     Left lower leg: No edema.  Lymphadenopathy:     Cervical: No cervical adenopathy.  Neurological:     General: No focal deficit present.     Mental Status: She is alert and oriented to person, place, and time.  Psychiatric:        Mood and Affect: Mood normal.        Thought Content: Thought content normal.     No results found for any visits on 02/09/24.      Assessment & Plan:   Assessment and Plan Assessment & Plan      No orders of the defined types were placed in this encounter.    No orders of the defined types were placed  in this encounter.   No follow-ups on file.  Corean LITTIE Ku, FNP

## 2024-02-09 ENCOUNTER — Ambulatory Visit: Admitting: Family Medicine

## 2024-02-09 ENCOUNTER — Encounter: Payer: Self-pay | Admitting: Family Medicine

## 2024-02-09 VITALS — BP 124/92 | HR 61 | Temp 98.4°F | Ht 62.0 in | Wt 180.8 lb

## 2024-02-09 DIAGNOSIS — M5481 Occipital neuralgia: Secondary | ICD-10-CM | POA: Diagnosis not present

## 2024-02-09 DIAGNOSIS — E785 Hyperlipidemia, unspecified: Secondary | ICD-10-CM

## 2024-02-09 DIAGNOSIS — E039 Hypothyroidism, unspecified: Secondary | ICD-10-CM | POA: Diagnosis not present

## 2024-02-09 DIAGNOSIS — M25511 Pain in right shoulder: Secondary | ICD-10-CM | POA: Diagnosis not present

## 2024-02-09 DIAGNOSIS — I10 Essential (primary) hypertension: Secondary | ICD-10-CM | POA: Diagnosis not present

## 2024-02-09 DIAGNOSIS — E1169 Type 2 diabetes mellitus with other specified complication: Secondary | ICD-10-CM | POA: Diagnosis not present

## 2024-02-09 DIAGNOSIS — Z7985 Long-term (current) use of injectable non-insulin antidiabetic drugs: Secondary | ICD-10-CM

## 2024-02-09 LAB — COMPREHENSIVE METABOLIC PANEL WITH GFR
ALT: 23 U/L (ref 0–35)
AST: 21 U/L (ref 0–37)
Albumin: 4.5 g/dL (ref 3.5–5.2)
Alkaline Phosphatase: 93 U/L (ref 39–117)
BUN: 13 mg/dL (ref 6–23)
CO2: 30 meq/L (ref 19–32)
Calcium: 10 mg/dL (ref 8.4–10.5)
Chloride: 99 meq/L (ref 96–112)
Creatinine, Ser: 0.71 mg/dL (ref 0.40–1.20)
GFR: 86.46 mL/min (ref >60.00)
Glucose, Bld: 81 mg/dL (ref 70–99)
Potassium: 4.3 meq/L (ref 3.5–5.1)
Sodium: 139 meq/L (ref 135–145)
Total Bilirubin: 0.6 mg/dL (ref 0.2–1.2)
Total Protein: 7.7 g/dL (ref 6.0–8.3)

## 2024-02-09 LAB — CBC WITH DIFFERENTIAL/PLATELET
Basophils Absolute: 0.1 K/uL (ref 0.0–0.1)
Basophils Relative: 0.9 % (ref 0.0–3.0)
Eosinophils Absolute: 0.1 K/uL (ref 0.0–0.7)
Eosinophils Relative: 1.5 % (ref 0.0–5.0)
HCT: 46 % (ref 36.0–46.0)
Hemoglobin: 15.1 g/dL — ABNORMAL HIGH (ref 12.0–15.0)
Lymphocytes Relative: 24.4 % (ref 12.0–46.0)
Lymphs Abs: 2 K/uL (ref 0.7–4.0)
MCHC: 32.8 g/dL (ref 30.0–36.0)
MCV: 82.4 fl (ref 78.0–100.0)
Monocytes Absolute: 0.5 K/uL (ref 0.1–1.0)
Monocytes Relative: 6.4 % (ref 3.0–12.0)
Neutro Abs: 5.6 K/uL (ref 1.4–7.7)
Neutrophils Relative %: 66.8 % (ref 43.0–77.0)
Platelets: 289 K/uL (ref 150.0–400.0)
RBC: 5.59 Mil/uL — ABNORMAL HIGH (ref 3.87–5.11)
RDW: 14.1 % (ref 11.5–15.5)
WBC: 8.4 K/uL (ref 4.0–10.5)

## 2024-02-09 LAB — LIPASE: Lipase: 35 U/L (ref 11.0–59.0)

## 2024-02-09 LAB — TSH: TSH: 0.9 u[IU]/mL (ref 0.35–5.50)

## 2024-02-09 MED ORDER — METHYLPREDNISOLONE ACETATE 40 MG/ML IJ SUSP
40.0000 mg | Freq: Once | INTRAMUSCULAR | Status: AC
  Administered 2024-02-09: 40 mg via INTRAMUSCULAR

## 2024-02-09 MED ORDER — KETOROLAC TROMETHAMINE 60 MG/2ML IM SOLN
60.0000 mg | Freq: Once | INTRAMUSCULAR | Status: AC
  Administered 2024-02-09: 60 mg via INTRAMUSCULAR

## 2024-02-09 NOTE — Patient Instructions (Signed)
 You have received a steroid injection in the office today.  You have received an injection of Toradol  in office today for pain.  Do not take any NSAIDs including aspirin , Aleve, ibuprofen, Celebrex , meloxicam within the next 6 hours after your injection.  We are checking labs today, will be in contact with any results that require further attention  Follow-up with me for new or worsening symptoms.

## 2024-02-10 ENCOUNTER — Ambulatory Visit: Payer: Self-pay | Admitting: Family Medicine

## 2024-03-02 ENCOUNTER — Other Ambulatory Visit: Payer: Self-pay | Admitting: Internal Medicine

## 2024-03-13 ENCOUNTER — Ambulatory Visit: Admitting: Internal Medicine

## 2024-03-13 VITALS — BP 110/60 | HR 70 | Temp 98.5°F | Ht 62.0 in | Wt 183.8 lb

## 2024-03-13 DIAGNOSIS — M7918 Myalgia, other site: Secondary | ICD-10-CM

## 2024-03-13 DIAGNOSIS — M542 Cervicalgia: Secondary | ICD-10-CM | POA: Diagnosis not present

## 2024-03-13 MED ORDER — PREDNISONE 20 MG PO TABS: 40.0000 mg | ORAL_TABLET | Freq: Every day | ORAL | 0 refills | Status: DC

## 2024-03-13 MED ORDER — METHOCARBAMOL 500 MG PO TABS: 500.0000 mg | ORAL_TABLET | Freq: Three times a day (TID) | ORAL | 0 refills | Status: AC | PRN

## 2024-03-13 NOTE — Progress Notes (Signed)
 "  Subjective:   Patient ID: Katrina [name], female    DOB: [birth year], 68 y.o.   MRN: [id number]  Discussed the use of AI scribe software for clinical note transcription with the patient, who gave verbal consent to proceed.  History of Present Illness Katrina [name] is a 68 year old female who presents with neck pain and headaches.  She has been experiencing neck pain for the past couple of weeks, which she attributes to carrying items up to her third-floor apartment. The pain originates in the middle of her back, near the shoulder blade region, and radiates up to her neck and head. It has worsened over time and is associated with a 'crunching and grinding' sensation. The pain sometimes extends around her ear and into her temple area. She describes her neck as feeling stiff, particularly on one side. No numbness or pain radiating into her arms or hands.  She recalls having a persistent headache for about a month after moving, located in the same area as her current neck pain. She questions whether the headache was related to her neck issues. Additionally, she suspects exposure to secondhand smoke in her apartment building, which she believes may trigger headaches, although she is uncertain if this is related to her current symptoms.  Her neck pain has been more manageable since starting hydrochlorothiazide  in addition to losartan , which she takes for heart palpitations. She does not report any significant side effects from her medications.  Review of Systems  Constitutional:  Positive for activity change. Negative for appetite change, chills, fatigue, fever and unexpected weight change.  Respiratory: Negative.    Cardiovascular: Negative.   Gastrointestinal: Negative.   Musculoskeletal:  Positive for arthralgias, back pain, myalgias and neck pain. Negative for gait problem, joint swelling and neck stiffness.  Skin: Negative.   Neurological:  Positive for headaches.    Objective:  Physical  Exam Constitutional:      Appearance: She is well-developed.  HENT:     Head: Normocephalic and atraumatic.  Cardiovascular:     Rate and Rhythm: Normal rate and regular rhythm.  Pulmonary:     Effort: Pulmonary effort is normal. No respiratory distress.     Breath sounds: Normal breath sounds. No wheezing or rales.  Abdominal:     General: Bowel sounds are normal. There is no distension.     Palpations: Abdomen is soft.     Tenderness: There is no abdominal tenderness.  Musculoskeletal:        General: Tenderness present.     Cervical back: Normal range of motion.     Comments: Tenderness scapular region and muscular distribution in the neck, no significant spinal tenderness  Skin:    General: Skin is warm and dry.  Neurological:     Mental Status: She is alert and oriented to person, place, and time.     Coordination: Coordination normal.     Vitals:   03/13/24 1533  BP: 110/60  Pulse: 70  Temp: 98.5 F (36.9 C)  TempSrc: Oral  SpO2: 97%  Weight: 183 lb 12.8 oz (83.4 kg)  Height: 5' 2 (1.575 m)    Assessment and Plan Assessment & Plan Neck and upper back myofascial pain   Pain has worsened, likely due to muscular strain from moving activities, with no red flag symptoms. Muscular origin is more likely than nerve involvement. Prescribe prednisone  for 5 days to reduce inflammation. Consider muscle relaxers for pain management if needed rx methocarbamol  to use tid prn.   "

## 2024-03-13 NOTE — Patient Instructions (Signed)
 We have sent in prednisone  to take 2 pills daily for 5 days.  We have sent in methocarbamol  which is a muscle relaxer you can use for pain.

## 2024-03-14 ENCOUNTER — Encounter: Payer: Self-pay | Admitting: Internal Medicine

## 2024-03-14 DIAGNOSIS — M542 Cervicalgia: Secondary | ICD-10-CM | POA: Insufficient documentation

## 2024-03-14 NOTE — Assessment & Plan Note (Signed)
 Pain has worsened, likely due to muscular strain from moving activities, with no red flag symptoms. Muscular origin is more likely than nerve involvement. Prescribe prednisone  for 5 days to reduce inflammation. Consider muscle relaxers for pain management if needed rx methocarbamol  to use tid prn.

## 2024-04-22 NOTE — Progress Notes (Signed)
 " Cardiology Office Note:   Date:  04/22/2024  ID:  Katrina [name], DOB 07/24/1955, MRN [id number] PCP: Rollene Almarie LABOR, MD  [facility] Providers Cardiologist:  Georganna Archer, MD { Chief Complaint:  No chief complaint on file.     History of Present Illness:   Katrina [name] is a 69 y.o. female with a PMH of HTN, HLD, DM2, obesity, hypothyroidism who presents for follow-up.  HPI:  Patient reports that she has been having symptoms of palpitation for the past 1 month.  She recently moved to a new apartment where she has to climb 3 flights of steps in order to get to her room.  After climbing 3 flights of steps she notices that her heart is pounding out of her chest.  When she finally is at her apartment and rest, still feels like her heart is racing and her symptoms will last all throughout the night and even through the next morning.  She is very physically active playing pickle ball, tennis, and walking and she never gets the symptoms.  Only when she climbs flights of steps at her apartment.  She has some associated chest heaviness but it does not happen every single time that she walks up 5 steps.  No exertional chest pain outside of the steps.  She also reports having some left arm pain that is worse with movement and palpation.  She has been lifting a lot of heavy boxes as a part of her move, but is not sure if this is MSK or her heart.  Her family history is notable for a father who had CABG and 2 brothers who had CAD.  She denies tobacco, alcohol, illicit drug use.  She does not check her blood pressures at home. Patient recently presented to the ED on 9/24 for her symptoms and workup was unremarkable.  Interval History 04/23/24: - Heart monitor ordered for palpitations but patient did not wear it because she lost it. - States that she has been doing much better since her blood pressure has been under better control since starting HCTZ and losartan ; however, when she walks up  her 3 flights of steps she still has shortness of breath and now some mild chest discomfort. - She only feels the symptoms when walking up 3 flights of steps. - She taking all her medication as prescribed without side effect.  No other complaints.   Past Medical History:  Diagnosis Date   Asthma 09/27/2021   BCC (basal cell carcinoma of skin) Infundibulocystic 03/11/2008   Inner Left Eye   Chest pain at rest 11/07/2012   Diabetes mellitus without complication (HCC)    Eosinophilic esophagitis 11/10/2012   Esophageal stricture    Exercise-induced asthma    GERD (gastroesophageal reflux disease)    Hx of colonic polyps 03/2020   Hypertension 09/27/2017   Hypothyroidism    Sarcoidosis    Shortness of breath 11/08/2012   RECENTLY  & WHEN i EXERCISE   Solar lentigo 03/11/2008   Back - Atypical   Thyroid  disease    Type 2 diabetes with complication (HCC) 08/22/2018   Upper airway cough syndrome      Studies Reviewed:    EKG: No new ECG       Cardiac Studies & Procedures   ______________________________________________________________________________________________     ECHOCARDIOGRAM  PCV ECHOCARDIOGRAM COMPLETE 02/16/2019  Narrative Echocardiogram 02/16/2019: Left ventricle cavity is normal in size. Normal left ventricular wall thickness. Normal LV systolic function with EF 69%.  Normal global wall motion. Normal diastolic filling pattern. Mild tricuspid regurgitation. Estimated pulmonary artery systolic pressure is 30 mmHg.      CT SCANS  CT CORONARY MORPH W/CTA COR W/SCORE 12/10/2020  Addendum 12/10/2020  3:25 PM ADDENDUM REPORT: 12/10/2020 15:22  CLINICAL DATA:  Chest pain  EXAM: Cardiac CTA  MEDICATIONS: Sub lingual nitro. 4mg  x 2  TECHNIQUE: The patient was scanned on a Siemens 192 slice scanner. Gantry rotation speed was 250 msecs. Collimation was 0.6 mm. A 100 kV prospective scan was triggered in the ascending thoracic aorta at 35-75% of the R-R  interval. Average HR during the scan was 60 bpm. The 3D data set was interpreted on a dedicated work station using MPR, MIP and VRT modes. A total of 80cc of contrast was used.  FINDINGS: Non-cardiac: See separate report from [facility].  Pulmonary veins drain normally to the left atrium. No LA appendage thrombus.  Calcium  Score: 0 Agatston units.  Coronary Arteries: Right dominant with no anomalies  LM: No plaque or stenosis.  LAD system: No plaque or stenosis.  Circumflex system: No plaque or stenosis.  RCA system: No plaque or stenosis.  IMPRESSION: 1. Coronary artery calcium  score 0 Agatston units. This suggests low risk for future cardiac events.  2.  No significant coronary disease noted.  Dalton Mclean   Electronically Signed By: Ezra Shuck M.D. On: 12/10/2020 15:22  Narrative EXAM: OVER-READ INTERPRETATION  CT CHEST  The following report is an over-read performed by radiologist Dr. Toribio Aye of [facility] on 12/10/2020. This over-read does not include interpretation of cardiac or coronary anatomy or pathology. The coronary calcium  score/coronary CTA interpretation by the cardiologist is attached.  COMPARISON:  Chest CTA 04/30/2018.  FINDINGS: 4 mm subpleural nodule in the right middle lobe anteriorly (axial image 14 of series 16), stable compared to the prior study, considered benign (presumably a subpleural lymph node). Areas of linear scarring are again noted in the lower lobes of the lungs bilaterally. Within the visualized portions of the thorax there are no suspicious appearing pulmonary nodules or masses, there is no acute consolidative airspace disease, no pleural effusions, no pneumothorax and no lymphadenopathy. Visualized portions of the upper abdomen are unremarkable. There are no aggressive appearing lytic or blastic lesions noted in the visualized portions of the skeleton.  IMPRESSION: 1. No significant  incidental noncardiac findings are noted.  Electronically Signed: By: Toribio Aye M.D. On: 12/10/2020 12:15     ______________________________________________________________________________________________      Risk Assessment/Calculations:          Physical Exam:     VS:  BP 112/60   Pulse 64   Ht 5' 2.05 (1.576 m)   Wt 183 lb 3.2 oz (83.1 kg)   SpO2 97%   BMI 33.45 kg/m      Wt Readings from Last 3 Encounters:  03/13/24 183 lb 12.8 oz (83.4 kg)  02/09/24 180 lb 12.8 oz (82 kg)  01/24/24 187 lb 6.4 oz (85 kg)     GEN: Well nourished, well developed, in no acute distress NECK: No JVD; No carotid bruits CARDIAC: RRR, no murmurs, rubs, gallops RESPIRATORY:  Clear to auscultation without rales, wheezing or rhonchi  ABDOMEN: Soft, non-tender, non-distended, normal bowel sounds EXTREMITIES:  Warm and well perfused, no edema; No deformity, 2+ radial pulses PSYCH: Normal mood and affect   Assessment & Plan Precordial pain - Reports that at peak exertion walking up 3 flights of steps she gets a central  chest pain. -CCTA from 2022 showed a CAC score of 0 and no CAD. -She is concerned about this pain because her brother had bypass surgery. -Although I suspect that this is noncardiac, it is been several years since an ischemic evaluation so we will repeat a CCTA. -Will also get a complete echocardiogram for further assessment. CCTA Complete echocardiogram Follow-up in 6 months Palpitations - Still has some palpitations after walking up to 3 flights of steps, but have improved. -She still interested in wearing a heart monitor. 7-day heart monitor Primary hypertension - Blood pressure is at goal.  No changes. Hyperlipidemia, unspecified hyperlipidemia type - CAC score 0 and LPA WNL.  - Unless her repeat CCTA shows something new, will defer starting any lipid-lowering therapy.          This note was written with the assistance of a dictation microphone or AI  dictation software. Please excuse any typos or grammatical errors.   Signed, Georganna Archer, MD 04/22/2024 8:23 PM    [facility]  "

## 2024-04-23 ENCOUNTER — Other Ambulatory Visit (HOSPITAL_COMMUNITY): Payer: Self-pay

## 2024-04-23 ENCOUNTER — Ambulatory Visit

## 2024-04-23 ENCOUNTER — Encounter: Payer: Self-pay | Admitting: Student in an Organized Health Care Education/Training Program

## 2024-04-23 ENCOUNTER — Ambulatory Visit
Attending: Student in an Organized Health Care Education/Training Program | Admitting: Student in an Organized Health Care Education/Training Program

## 2024-04-23 VITALS — BP 112/60 | HR 64 | Ht 62.05 in | Wt 183.2 lb

## 2024-04-23 DIAGNOSIS — E785 Hyperlipidemia, unspecified: Secondary | ICD-10-CM | POA: Diagnosis not present

## 2024-04-23 DIAGNOSIS — R002 Palpitations: Secondary | ICD-10-CM

## 2024-04-23 DIAGNOSIS — I10 Essential (primary) hypertension: Secondary | ICD-10-CM

## 2024-04-23 DIAGNOSIS — R072 Precordial pain: Secondary | ICD-10-CM | POA: Diagnosis not present

## 2024-04-23 MED ORDER — METOPROLOL TARTRATE 50 MG PO TABS
50.0000 mg | ORAL_TABLET | Freq: Once | ORAL | 0 refills | Status: AC
  Filled 2024-04-23: qty 1, 1d supply, fill #0

## 2024-04-23 NOTE — Assessment & Plan Note (Signed)
Blood pressure is at goal. No changes

## 2024-04-23 NOTE — Patient Instructions (Addendum)
 MEDICATION:  DAY OF CT SCAN: metoprolol  tartrate (LOPRESSOR ) 50 MG tablet         Take 1 tablet (50 mg total) by mouth once. Take 90-120 minutes prior to scan. Hold for SBP less than 110.   Lab Work: Bmp  If you have labs (blood work) drawn today and your tests are completely normal, you will receive your results only by: MyChart Message (if you have MyChart) OR A paper copy in the mail If you have any lab test that is abnormal or we need to change your treatment, we will call you to review the results.  Testing/Procedures:  Echocardiogram  Your physician has requested that you have an echocardiogram. Echocardiography is a painless test that uses sound waves to create images of your heart. It provides your doctor with information about the size and shape of your heart and how well your hearts chambers and valves are working. This procedure takes approximately one hour. There are no restrictions for this procedure. Please do NOT wear cologne, perfume, aftershave, or lotions (deodorant is allowed). Please arrive 15 minutes prior to your appointment time.  Please note: We ask at that you not bring children with you during ultrasound (echo/ vascular) testing. Due to room size and safety concerns, children are not allowed in the ultrasound rooms during exams. Our front office staff cannot provide observation of children in our lobby area while testing is being conducted. An adult accompanying a patient to their appointment will only be allowed in the ultrasound room at the discretion of the ultrasound technician under special circumstances. We apologize for any inconvenience.   7 Day Heart Monitor   Your physician has requested that you wear a Zio heart monitor for __7___ days. This will be mailed to your home with instructions on how to apply the monitor and how to return it when finished. Please allow 2 weeks after returning the heart monitor before our office calls you with the  results.   Coronary CTA     Your cardiac CT will be scheduled at one of the below locations:   [facility] [address]  [place], [place] [address]  If scheduled at the [facility] at [address], please enter the parking lot using the [address] entrance and use the FREE valet service at the patient drop-off area. Enter the building and check-in with registration on the main floor.  Please follow these instructions carefully (unless otherwise directed):  An IV will be required for this test and Nitroglycerin  will be given.  Hold all erectile dysfunction medications at least 3 days (72 hrs) prior to test. (Ie viagra, cialis, sildenafil, tadalafil, etc)   On the Night Before the Test: Be sure to Drink plenty of water. Do not consume any caffeinated/decaffeinated beverages or chocolate 12 hours prior to your test. Do not take any antihistamines 12 hours prior to your test.  On the Day of the Test: Drink plenty of water until 1 hour prior to the test. Do not eat any food 1 hour prior to test. You may take your regular medications prior to the test.  Take metoprolol  (Lopressor ) two hours prior to test. If you take Furosemide/Hydrochlorothiazide /Spironolactone /Chlorthalidone, please HOLD on the morning of the test. Patients who wear a continuous glucose monitor MUST remove the device prior to scanning. FEMALES- please wear underwire-free bra if available, avoid dresses & tight clothing      After the Test: Drink plenty of water. After  receiving IV contrast, you may experience a mild flushed feeling. This is normal. On occasion, you may experience a mild rash up to 24 hours after the test. This is not dangerous. If this occurs, you can take Benadryl  25 mg, Zyrtec, Claritin, or Allegra and increase your fluid intake. (Patients taking Tikosyn should avoid Benadryl , and may take Zyrtec, Claritin, or Allegra) If you experience trouble  breathing, this can be serious. If it is severe call 911 IMMEDIATELY. If it is mild, please call our office.  We will call to schedule your test 2-4 weeks out understanding that some insurance companies will need an authorization prior to the service being performed.   For more information and frequently asked questions, please visit our website : [email or web address]  For non-scheduling related questions, please contact the cardiac imaging nurse navigator should you have any questions/concerns: Cardiac Imaging Nurse Navigators Direct Office Dial: [phone]   For scheduling needs, including cancellations and rescheduling, please call Brittany, [phone].   Follow-Up: At [facility], you and your health needs are our priority.  As part of our continuing mission to provide you with exceptional heart care, our providers are all part of one team.  This team includes your primary Cardiologist (physician) and Advanced Practice Providers or APPs (Physician Assistants and Nurse Practitioners) who all work together to provide you with the care you need, when you need it.  Your next appointment:   6 month(s)  Provider:   Georganna Archer, MD

## 2024-04-23 NOTE — Progress Notes (Unsigned)
 Enrolled for [employer] to mail a ZIO XT long term holter monitor to the patients address on file.

## 2024-04-24 ENCOUNTER — Ambulatory Visit: Payer: Self-pay | Admitting: Student in an Organized Health Care Education/Training Program

## 2024-04-24 LAB — BASIC METABOLIC PANEL WITH GFR
BUN/Creatinine Ratio: 22 (ref 12–28)
BUN: 19 mg/dL (ref 8–27)
CO2: 23 mmol/L (ref 20–29)
Calcium: 9.6 mg/dL (ref 8.7–10.3)
Chloride: 102 mmol/L (ref 96–106)
Creatinine, Ser: 0.85 mg/dL (ref 0.57–1.00)
Glucose: 90 mg/dL (ref 70–99)
Potassium: 4.8 mmol/L (ref 3.5–5.2)
Sodium: 139 mmol/L (ref 134–144)
eGFR: 74 mL/min/1.73

## 2024-05-01 ENCOUNTER — Encounter (HOSPITAL_COMMUNITY): Payer: Self-pay

## 2024-05-03 ENCOUNTER — Ambulatory Visit (HOSPITAL_COMMUNITY)
Admission: RE | Admit: 2024-05-03 | Discharge: 2024-05-03 | Disposition: A | Discharge status: Home / Self Care | Source: Ambulatory Visit | Attending: Student in an Organized Health Care Education/Training Program | Admitting: Student in an Organized Health Care Education/Training Program

## 2024-05-03 DIAGNOSIS — R072 Precordial pain: Secondary | ICD-10-CM | POA: Insufficient documentation

## 2024-05-03 MED ORDER — NITROGLYCERIN 0.4 MG SL SUBL
0.8000 mg | SUBLINGUAL_TABLET | Freq: Once | SUBLINGUAL | Status: AC
  Administered 2024-05-03: 0.8 mg via SUBLINGUAL

## 2024-05-03 MED ORDER — IOHEXOL 350 MG/ML SOLN
100.0000 mL | Freq: Once | INTRAVENOUS | Status: AC | PRN
  Administered 2024-05-03: 100 mL via INTRAVENOUS

## 2024-05-11 ENCOUNTER — Other Ambulatory Visit: Payer: Self-pay | Admitting: Internal Medicine

## 2024-05-15 ENCOUNTER — Other Ambulatory Visit (HOSPITAL_COMMUNITY): Payer: Self-pay

## 2024-05-15 ENCOUNTER — Telehealth: Payer: Self-pay

## 2024-05-15 NOTE — Telephone Encounter (Signed)
 Pharmacy Patient Advocate Encounter   Received notification from [facility] KEY that prior authorization for Mounjaro  15 is required/requested.   Insurance verification completed.   The patient is insured through [facility] AD[employer]/RX ADVANCE.   Per test claim: PA required; PA submitted to above mentioned insurance via Latent Key/confirmation #/EOC [id number] Status is pending

## 2024-05-15 NOTE — Telephone Encounter (Signed)
 Pt is aware of PA approval.

## 2024-05-15 NOTE — Telephone Encounter (Signed)
 Pharmacy Patient Advocate Encounter  Received notification from [employer] that Prior Authorization for Mounjaro  15 has been APPROVED from 05/15/24 to 05/15/25. Ran test claim, Copay is $0.00. This test claim was processed through [facility]- copay amounts may vary at other pharmacies due to pharmacy/plan contracts, or as the patient moves through the different stages of their insurance plan.   PA #/Case ID/Reference #: # [id number]

## 2024-05-16 ENCOUNTER — Ambulatory Visit (HOSPITAL_COMMUNITY)
Admission: RE | Admit: 2024-05-16 | Discharge: 2024-05-16 | Disposition: A | Discharge status: Home / Self Care | Source: Ambulatory Visit | Attending: Student in an Organized Health Care Education/Training Program | Admitting: Student in an Organized Health Care Education/Training Program

## 2024-05-16 ENCOUNTER — Other Ambulatory Visit (HOSPITAL_COMMUNITY): Payer: Self-pay

## 2024-05-16 DIAGNOSIS — R072 Precordial pain: Secondary | ICD-10-CM | POA: Insufficient documentation

## 2024-05-17 LAB — ECHOCARDIOGRAM COMPLETE
AR max vel: 2.53 cm2
AV Area VTI: 2.7 cm2
AV Area mean vel: 2.77 cm2
AV Mean grad: 5 mmHg
AV Peak grad: 11.7 mmHg
Ao pk vel: 1.71 m/s
Area-P 1/2: 3.16 cm2
S' Lateral: 2.15 cm

## 2024-12-20 ENCOUNTER — Ambulatory Visit
